# Patient Record
Sex: Female | Born: 1985
Health system: Southern US, Community
[De-identification: ages and names within clinical notes are randomized; demographics above are authoritative.]

## PROBLEM LIST (undated history)

## (undated) DIAGNOSIS — Q2112 Patent foramen ovale: Secondary | ICD-10-CM

## (undated) DIAGNOSIS — C801 Malignant (primary) neoplasm, unspecified: Secondary | ICD-10-CM

## (undated) DIAGNOSIS — C3490 Malignant neoplasm of unspecified part of unspecified bronchus or lung: Secondary | ICD-10-CM

## (undated) DIAGNOSIS — I739 Peripheral vascular disease, unspecified: Secondary | ICD-10-CM

## (undated) DIAGNOSIS — F419 Anxiety disorder, unspecified: Secondary | ICD-10-CM

## (undated) HISTORY — PX: WISDOM TOOTH EXTRACTION: SHX21

## (undated) HISTORY — DX: Anxiety disorder, unspecified: F41.9

## (undated) HISTORY — DX: Malignant (primary) neoplasm, unspecified: C80.1

---

## 2011-08-12 ENCOUNTER — Other Ambulatory Visit: Payer: Self-pay | Admitting: Emergency Medicine

## 2011-08-12 DIAGNOSIS — R109 Unspecified abdominal pain: Secondary | ICD-10-CM

## 2011-08-12 DIAGNOSIS — N390 Urinary tract infection, site not specified: Secondary | ICD-10-CM

## 2011-08-12 DIAGNOSIS — R102 Pelvic and perineal pain: Secondary | ICD-10-CM

## 2011-08-14 ENCOUNTER — Ambulatory Visit
Admission: RE | Admit: 2011-08-14 | Discharge: 2011-08-14 | Disposition: A | Discharge status: Home / Self Care | Payer: 59 | Source: Ambulatory Visit | Attending: Emergency Medicine | Admitting: Emergency Medicine

## 2011-08-14 DIAGNOSIS — R109 Unspecified abdominal pain: Secondary | ICD-10-CM

## 2011-08-14 DIAGNOSIS — N390 Urinary tract infection, site not specified: Secondary | ICD-10-CM

## 2011-08-14 DIAGNOSIS — R102 Pelvic and perineal pain: Secondary | ICD-10-CM

## 2011-08-22 ENCOUNTER — Other Ambulatory Visit: Payer: Self-pay | Admitting: Internal Medicine

## 2011-08-26 ENCOUNTER — Other Ambulatory Visit: Payer: Self-pay | Admitting: Internal Medicine

## 2011-08-26 DIAGNOSIS — N1330 Unspecified hydronephrosis: Secondary | ICD-10-CM

## 2011-09-11 ENCOUNTER — Other Ambulatory Visit: Payer: 59

## 2011-09-18 ENCOUNTER — Ambulatory Visit
Admission: RE | Admit: 2011-09-18 | Discharge: 2011-09-18 | Disposition: A | Discharge status: Home / Self Care | Payer: 59 | Source: Ambulatory Visit | Attending: Internal Medicine | Admitting: Internal Medicine

## 2011-09-18 ENCOUNTER — Other Ambulatory Visit: Payer: Self-pay | Admitting: Internal Medicine

## 2011-09-18 DIAGNOSIS — N1330 Unspecified hydronephrosis: Secondary | ICD-10-CM

## 2013-09-09 IMAGING — US US PELVIS COMPLETE
1 series · 14 of 25 positions shown · non-contrast
Comparison: None

CLINICAL DATA: Pelvic pain.

TRANSABDOMINAL AND TRANSVAGINAL ULTRASOUND OF PELVIS
TECHNIQUE: Both transabdominal and transvaginal ultrasound
examinations of the pelvis were performed. Transabdominal technique
was performed for global imaging of the pelvis including uterus,
ovaries, adnexal regions, and pelvic cul-de-sac.

[Series 1: us pelvis complete · 0.25mm/px · 14 of 68 slices shown]
[im 1/68]
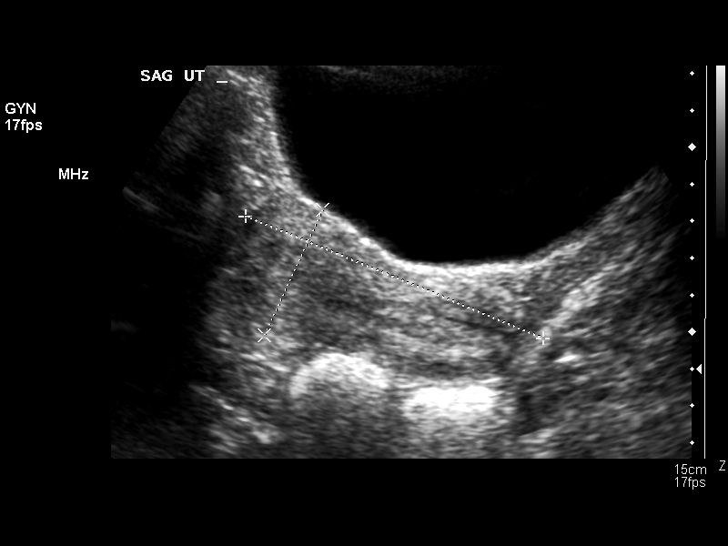
[im 6/68]
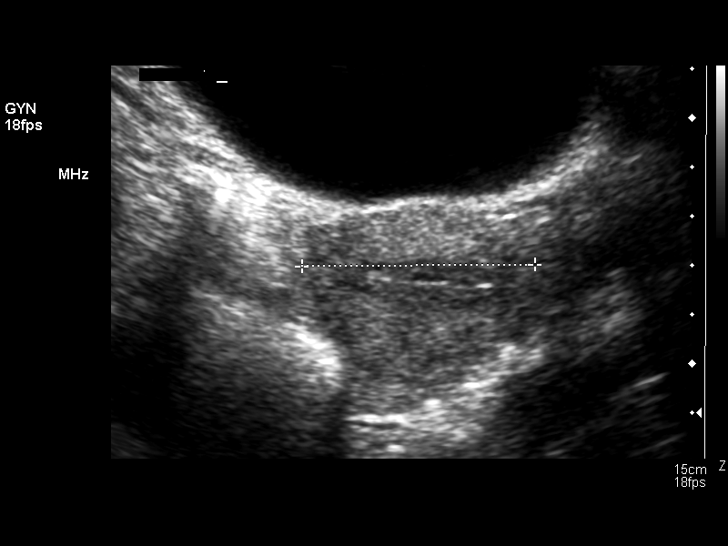
[im 12/68]
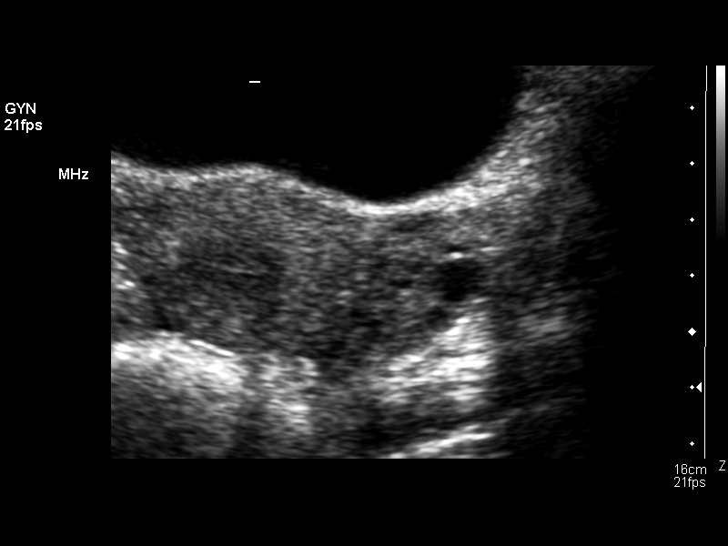
[im 17/68]
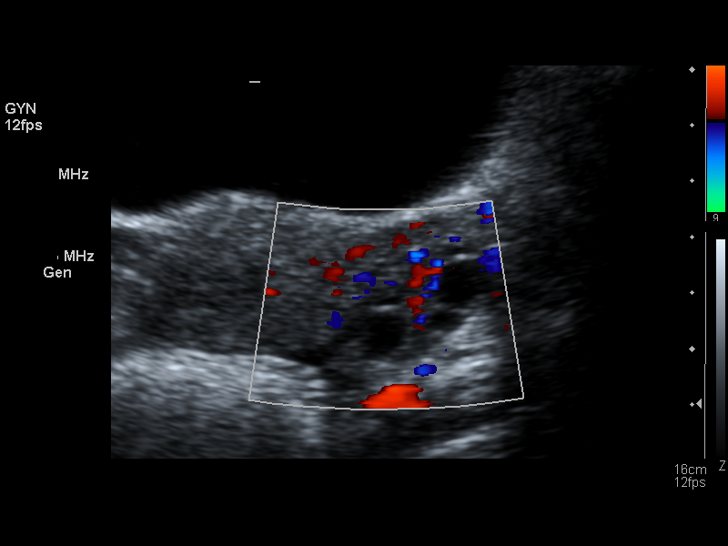
[im 23/68]
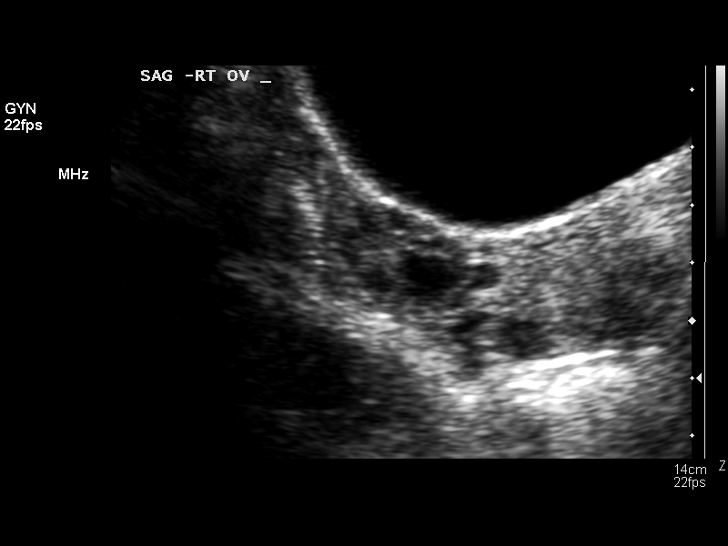
[im 26/68]
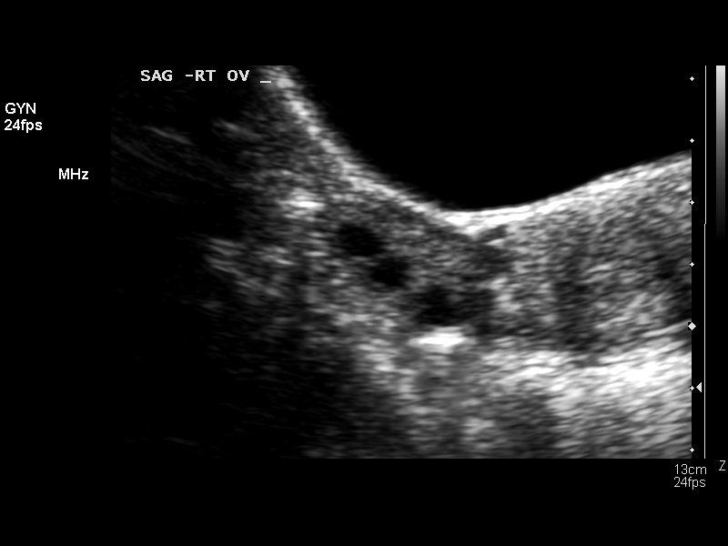
[im 31/68]
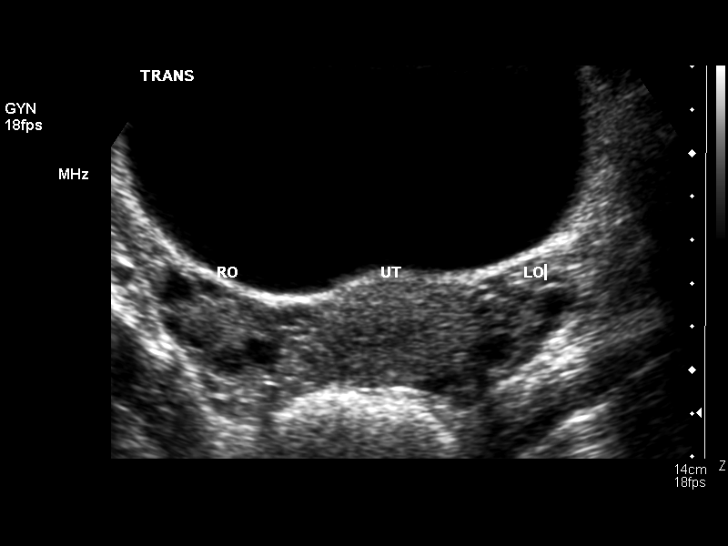
[im 37/68]
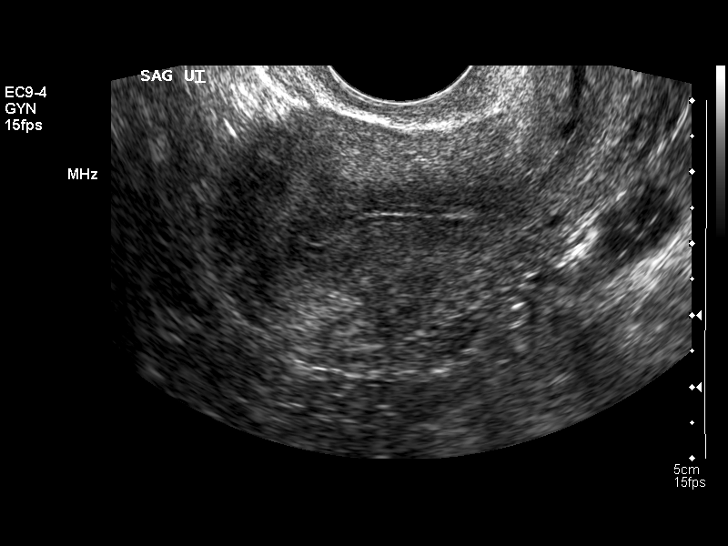
[im 42/68]
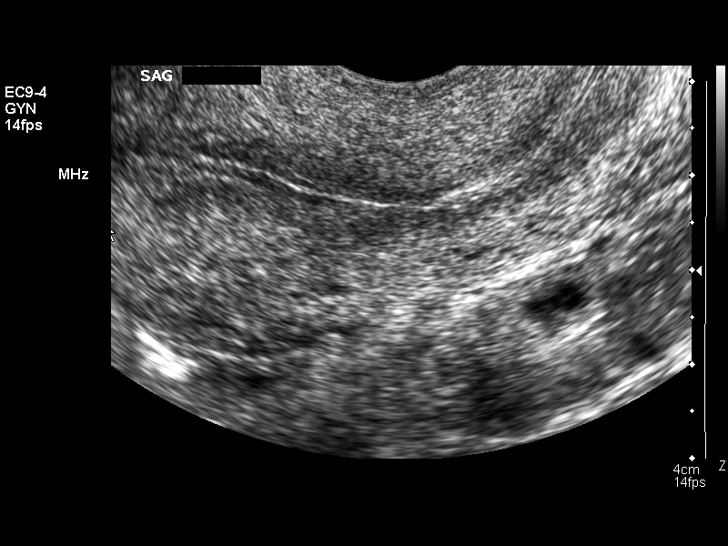
[im 45/68]
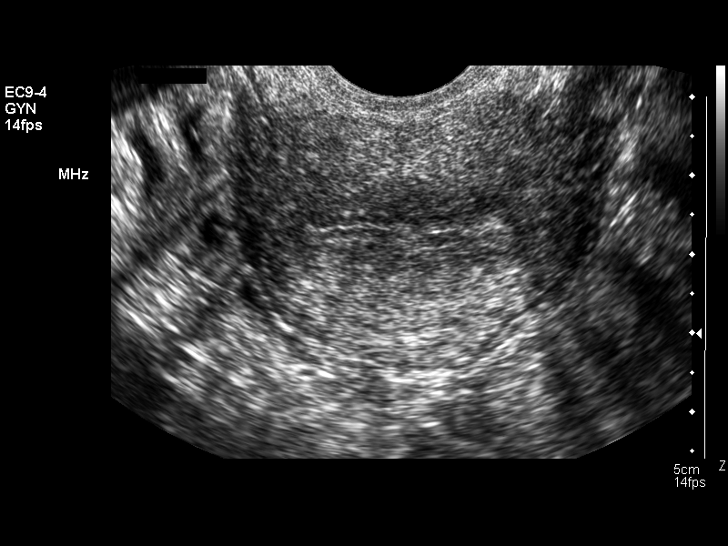
[im 51/68]
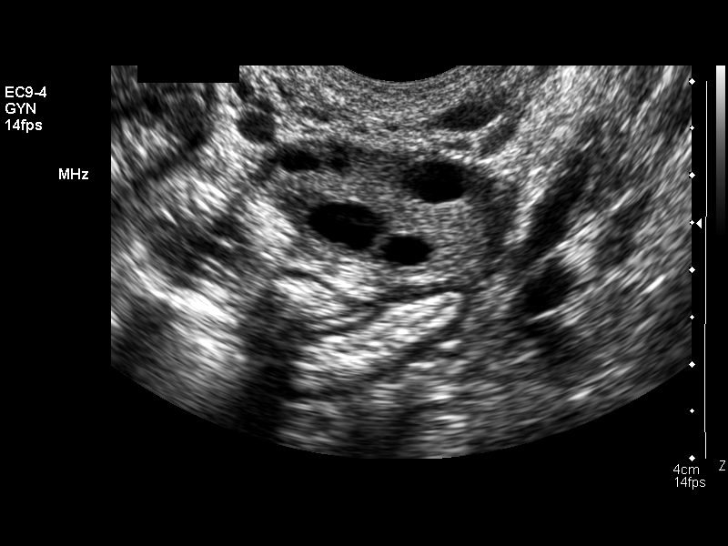
[im 56/68]
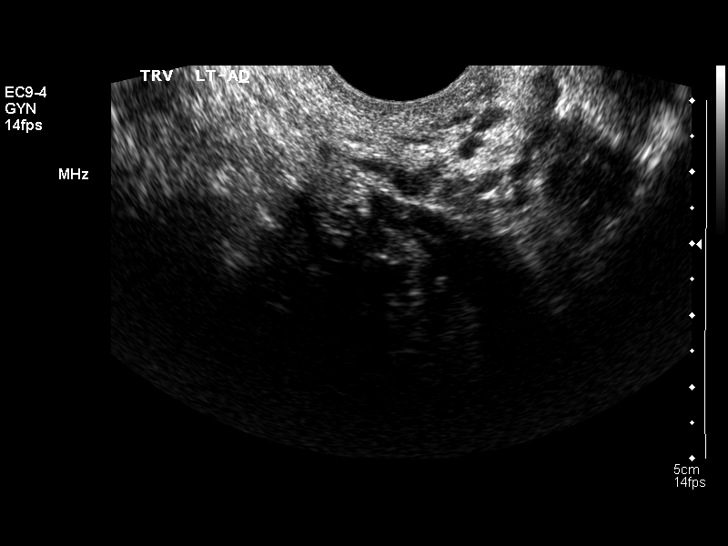
[im 62/68]
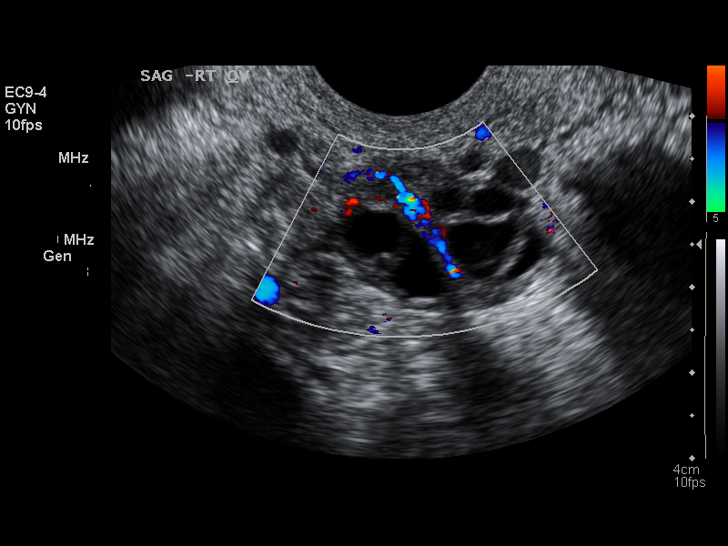
[im 68/68]
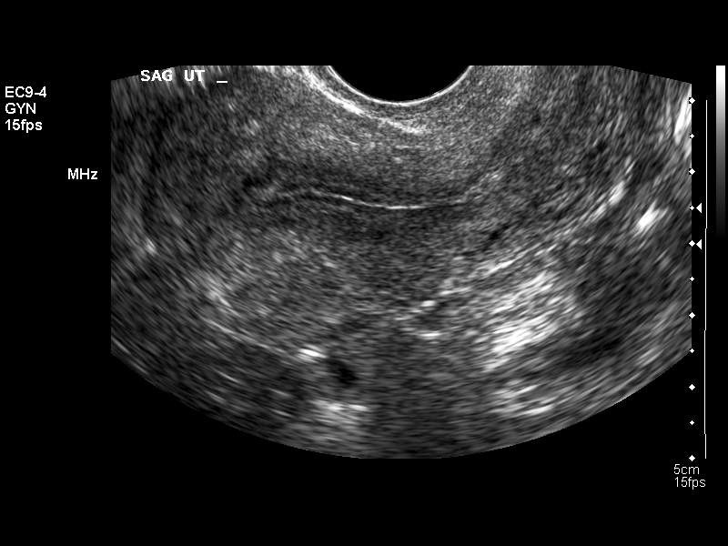

[14 of 25 positions shown; findings below may reference images not displayed]

It was necessary to proceed with endovaginal exam following the
transabdominal exam to visualize the endometrium and ovaries.
FINDINGS: Uterus: Measures 11.0 x 3.8 x 4.7 cm.  No myometrial abnormalities.

Endometrium: Normal in thickness measuring a maximum of 3.4 mm.

Right ovary:  Measures 3.5 x 1.7 x 2.8 cm.  No cysts or masses.
Small follicles are noted.

Left ovary: Measures 2.9 x 1.4 x 2.8 cm.  No cysts or masses.
Small follicles.

Other findings: None
IMPRESSION: Normal study. No evidence of pelvic mass or other significant
abnormality.

## 2014-02-14 ENCOUNTER — Ambulatory Visit (INDEPENDENT_AMBULATORY_CARE_PROVIDER_SITE_OTHER): Payer: 59 | Admitting: Internal Medicine

## 2014-02-14 VITALS — BP 122/76 | HR 88 | Temp 98.0°F | Resp 17 | Ht 66.0 in | Wt 129.0 lb

## 2014-02-14 DIAGNOSIS — I889 Nonspecific lymphadenitis, unspecified: Secondary | ICD-10-CM

## 2014-02-14 DIAGNOSIS — J301 Allergic rhinitis due to pollen: Secondary | ICD-10-CM

## 2014-02-14 NOTE — Patient Instructions (Signed)
The enlarged lump on your neck appears to be an enlarged lymph node due to seasonal allergies.  The best way for this to improve is by taking care of your allergy symptoms.  Please start taking allegra every day.  Also Nasacot OTC is a nasal spray that should help with nasal drainage.

## 2014-02-14 NOTE — Progress Notes (Signed)
Subjective:     Patient ID: Melissa Pineda, female   DOB: 1986-05-02, 28 y.o.   MRN: [id number]  HPI 28 yr female here for enlarged lymph node on the right neck region.  States the gland appeared yesterday and is very painful to the touch.  Last week the patient had a similar painful gland on the left side that resolved without tx after 2 days.   She has no associated symptoms of fevers, night sweats, cough.  Does admit sneezing, watery eyes.  Saw allergist last year who recommended daily allegra.  Has only been taking allergra every other day.    Review of Systems  Constitutional: Negative for fever, weight loss and malaise/fatigue.  HENT: Positive for congestion. Negative for ear pain and sore throat.   Eyes: Positive for discharge. Negative for pain.       Watery and itchy eyes  Respiratory: Negative for cough, shortness of breath and wheezing.   Cardiovascular: Negative for chest pain and palpitations.  Gastrointestinal: Negative for nausea, vomiting and abdominal pain.  Genitourinary: Negative for dysuria and urgency.  Musculoskeletal: Negative for myalgias and neck pain.  Skin: Negative for rash.  Neurological: Negative for dizziness, loss of consciousness and headaches.   Review of Systems  Constitutional: Negative for fever, weight loss and malaise/fatigue.  HENT: Positive for congestion. Negative for ear pain and sore throat.   Eyes: Positive for discharge. Negative for pain.       Watery and itchy eyes  Respiratory: Negative for cough, shortness of breath and wheezing.   Cardiovascular: Negative for chest pain and palpitations.  Gastrointestinal: Negative for nausea, vomiting and abdominal pain.  Genitourinary: Negative for dysuria and urgency.  Musculoskeletal: Negative for myalgias and neck pain.  Skin: Negative for rash.  Neurological: Negative for dizziness, loss of consciousness and headaches.       Objective:   Physical Exam  Constitutional: She is oriented to person,  place, and time. She appears well-developed and well-nourished.  HENT:  Head: Normocephalic and atraumatic.  Right Ear: Hearing, tympanic membrane and external ear normal.  Left Ear: Hearing, tympanic membrane and external ear normal.  Nose: Mucosal edema present. Right sinus exhibits maxillary sinus tenderness. Right sinus exhibits no frontal sinus tenderness. Left sinus exhibits maxillary sinus tenderness. Left sinus exhibits no frontal sinus tenderness.  Mouth/Throat: Oropharynx is clear and moist. No oral lesions. Normal dentition. No dental abscesses, uvula swelling or dental caries. No oropharyngeal exudate, posterior oropharyngeal edema, posterior oropharyngeal erythema or tonsillar abscesses.  Eyes: Pupils are equal, round, and reactive to light.  Neck: Normal range of motion. Neck supple.    1 cm enlarged and tender node on the right cervical chain anterior to the SCM, 1 cm below the angle of the mandible   Cardiovascular: Normal rate and regular rhythm.   Pulmonary/Chest: Effort normal and breath sounds normal. She has no wheezes. She has no rales.  Abdominal: Soft. Bowel sounds are normal. She exhibits no mass. There is no tenderness.  Lymphadenopathy:    She has cervical adenopathy.  Neurological: She is alert and oriented to person, place, and time.  Skin: Skin is warm and dry.  Patchy redness on chest area.   Pt states she gets when she touches her neck.   Psychiatric: She has a normal mood and affect. Thought content normal.       Filed Vitals:   02/14/14 1555  BP: 122/76  Pulse: 88  Temp: 98 F (36.7 C)  Resp: 17  Assessment:     1. Enlarged cervical lymph node - rt side     Plan:     1.treat allergies more aggressively 2. Reck 10 d if not resolved      I have completed the patient encounter in its entirety as documented by [facility], with editing by me where necessary. Shemiah Rosch P. Laney Pastor, M.D.

## 2015-06-28 ENCOUNTER — Ambulatory Visit (INDEPENDENT_AMBULATORY_CARE_PROVIDER_SITE_OTHER): Payer: 59 | Admitting: Physician Assistant

## 2015-06-28 ENCOUNTER — Encounter: Payer: Self-pay | Admitting: Physician Assistant

## 2015-06-28 VITALS — BP 132/77 | HR 86 | Temp 98.7°F | Resp 16 | Ht 66.0 in | Wt 120.2 lb

## 2015-06-28 DIAGNOSIS — H9203 Otalgia, bilateral: Secondary | ICD-10-CM

## 2015-06-28 MED ORDER — FLUTICASONE PROPIONATE 50 MCG/ACT NA SUSP: 2.0000 | Freq: Every day | NASAL | Status: DC

## 2015-06-28 MED ORDER — AZITHROMYCIN 250 MG PO TABS: ORAL_TABLET | ORAL | Status: DC

## 2015-06-28 NOTE — Progress Notes (Signed)
   06/28/2015 at 3:12 PM  Melissa Pineda / DOB: Feb 25, 1986 / MRN: [id number]  The patient  does not have a problem list on file.  SUBJECTIVE  Melissa Pineda is a 29 y.o. female complaining of nasal blockage, post nasal drip and sinus and nasal congestion that started 7 days ago.  Associated symptoms include headache today, and she denies fever, cough, sore throat, difficulty breathing and jaw pain.The patient symptoms show no change. Treatments tried thus far include antihistamine-decongestant of choice with poor relief. She denies sick contacts.    She  has no past medical history on file.    Medications reviewed and updated by myself where necessary, and exist elsewhere in the encounter.   Melissa Pineda has No Known Allergies. She  reports that she has never smoked. She does not have any smokeless tobacco history on file. She  has no sexual activity history on file. The patient  has no past surgical history on file.  Her family history is not on file.  Review of Systems  Constitutional: Negative for fever and chills.  HENT: Positive for congestion and ear pain. Negative for tinnitus.   Eyes: Negative.   Respiratory: Negative for shortness of breath.   Cardiovascular: Negative for chest pain.  Gastrointestinal: Negative for nausea and abdominal pain.  Genitourinary: Negative.   Skin: Negative for rash.  Neurological: Negative for dizziness and headaches.    OBJECTIVE  Her  height is '5\' 6"'$  (1.676 m) and weight is 120 lb 3.2 oz (54.522 kg). Her oral temperature is 98.7 F (37.1 C). Her blood pressure is 132/77 and her pulse is 86. Her respiration is 16.  The patient's body mass index is 19.41 kg/(m^2).  Physical Exam  Constitutional: She is oriented to person, place, and time. She appears well-developed and well-nourished. No distress.  Eyes: Pupils are equal, round, and reactive to light.  Cardiovascular: Normal rate.   Respiratory: Effort normal. No respiratory distress.  GI: She exhibits  no distension.  Neurological: She is alert and oriented to person, place, and time.  Skin: Skin is warm and dry. She is not diaphoretic.  Psychiatric: She has a normal mood and affect. Her behavior is normal. Judgment and thought content normal.    No results found for this or any previous visit (from the past 24 hour(s)).  ASSESSMENT & PLAN  Melissa Pineda was seen today for ear pain and nasal congestion.  Diagnoses and all orders for this visit:  Ear pain, bilateral: ETD vs. ABRS.  Advised she try the flonase along with her normal Allegra-D 24 hour formulation first for 3 days, and if no improvement with this regimen then fill Abx.   -     azithromycin (ZITHROMAX) 250 MG tablet; Take 2 tabs PO x 1 dose, then 1 tab PO QD x 4 days -     fluticasone (FLONASE) 50 MCG/ACT nasal spray; Place 2 sprays into both nostrils daily. Take two sprays in each nostril daily.   The patient was advised to call or come back to clinic if she does not see an improvement in symptoms, or worsens with the above plan.   Philis Fendt, MHS, PA-C [facility] 06/28/2015 3:12 PM

## 2015-10-21 ENCOUNTER — Ambulatory Visit (INDEPENDENT_AMBULATORY_CARE_PROVIDER_SITE_OTHER): Payer: 59 | Admitting: Family Medicine

## 2015-10-21 VITALS — BP 120/80 | HR 97 | Temp 98.1°F | Resp 20 | Ht 66.0 in | Wt 124.2 lb

## 2015-10-21 DIAGNOSIS — J040 Acute laryngitis: Secondary | ICD-10-CM | POA: Diagnosis not present

## 2015-10-21 DIAGNOSIS — R059 Cough, unspecified: Secondary | ICD-10-CM

## 2015-10-21 DIAGNOSIS — J3489 Other specified disorders of nose and nasal sinuses: Secondary | ICD-10-CM | POA: Diagnosis not present

## 2015-10-21 DIAGNOSIS — R05 Cough: Secondary | ICD-10-CM

## 2015-10-21 MED ORDER — AZITHROMYCIN 250 MG PO TABS: ORAL_TABLET | ORAL | Status: DC

## 2015-10-21 NOTE — Patient Instructions (Signed)
Saline nasal spray atleast 4 times per day if needed for congestion, over the counter mucinex or mucinex DM for cough, cepacol or other cough drop for sore throat. Drink plenty of fluids, voice rest as discussed. If discolored sinus discharge and pain not improving next week - can fill Zpak.  Return to the clinic or go to the nearest emergency room if any of your symptoms worsen or new symptoms occur.  Upper Respiratory Infection, Adult Most upper respiratory infections (URIs) are a viral infection of the air passages leading to the lungs. A URI affects the nose, throat, and upper air passages. The most common type of URI is nasopharyngitis and is typically referred to as "the common cold." URIs run their course and usually go away on their own. Most of the time, a URI does not require medical attention, but sometimes a bacterial infection in the upper airways can follow a viral infection. This is called a secondary infection. Sinus and middle ear infections are common types of secondary upper respiratory infections. Bacterial pneumonia can also complicate a URI. A URI can worsen asthma and chronic obstructive pulmonary disease (COPD). Sometimes, these complications can require emergency medical care and may be life threatening.  CAUSES Almost all URIs are caused by viruses. A virus is a type of germ and can spread from one person to another.  RISKS FACTORS You may be at risk for a URI if:   You smoke.   You have chronic heart or lung disease.  You have a weakened defense (immune) system.   You are very young or very old.   You have nasal allergies or asthma.  You work in crowded or poorly ventilated areas.  You work in health care facilities or schools. SIGNS AND SYMPTOMS  Symptoms typically develop 2-3 days after you come in contact with a cold virus. Most viral URIs last 7-10 days. However, viral URIs from the influenza virus (flu virus) can last 14-18 days and are typically more  severe. Symptoms may include:   Runny or stuffy (congested) nose.   Sneezing.   Cough.   Sore throat.   Headache.   Fatigue.   Fever.   Loss of appetite.   Pain in your forehead, behind your eyes, and over your cheekbones (sinus pain).  Muscle aches.  DIAGNOSIS  Your health care provider may diagnose a URI by:  Physical exam.  Tests to check that your symptoms are not due to another condition such as:  Strep throat.  Sinusitis.  Pneumonia.  Asthma. TREATMENT  A URI goes away on its own with time. It cannot be cured with medicines, but medicines may be prescribed or recommended to relieve symptoms. Medicines may help:  Reduce your fever.  Reduce your cough.  Relieve nasal congestion. HOME CARE INSTRUCTIONS   Take medicines only as directed by your health care provider.   Gargle warm saltwater or take cough drops to comfort your throat as directed by your health care provider.  Use a warm mist humidifier or inhale steam from a shower to increase air moisture. This may make it easier to breathe.  Drink enough fluid to keep your urine clear or pale yellow.   Eat soups and other clear broths and maintain good nutrition.   Rest as needed.   Return to work when your temperature has returned to normal or as your health care provider advises. You may need to stay home longer to avoid infecting others. You can also use a face mask  and careful hand washing to prevent spread of the virus.  Increase the usage of your inhaler if you have asthma.   Do not use any tobacco products, including cigarettes, chewing tobacco, or electronic cigarettes. If you need help quitting, ask your health care provider. PREVENTION  The best way to protect yourself from getting a cold is to practice good hygiene.   Avoid oral or hand contact with people with cold symptoms.   Wash your hands often if contact occurs.  There is no clear evidence that vitamin C, vitamin  E, echinacea, or exercise reduces the chance of developing a cold. However, it is always recommended to get plenty of rest, exercise, and practice good nutrition.  SEEK MEDICAL CARE IF:   You are getting worse rather than better.   Your symptoms are not controlled by medicine.   You have chills.  You have worsening shortness of breath.  You have brown or red mucus.  You have yellow or brown nasal discharge.  You have pain in your face, especially when you bend forward.  You have a fever.  You have swollen neck glands.  You have pain while swallowing.  You have white areas in the back of your throat. SEEK IMMEDIATE MEDICAL CARE IF:   You have severe or persistent:  Headache.  Ear pain.  Sinus pain.  Chest pain.  You have chronic lung disease and any of the following:  Wheezing.  Prolonged cough.  Coughing up blood.  A change in your usual mucus.  You have a stiff neck.  You have changes in your:  Vision.  Hearing.  Thinking.  Mood. MAKE SURE YOU:   Understand these instructions.  Will watch your condition.  Will get help right away if you are not doing well or get worse.   This information is not intended to replace advice given to you by your health care provider. Make sure you discuss any questions you have with your health care provider.   Document Released: 04/02/2001 Document Revised: 02/21/2015 Document Reviewed: 01/12/2014 Elsevier Interactive Patient Education 2016 [employer]  Laryngitis Laryngitis is inflammation of your vocal cords. This causes hoarseness, coughing, loss of voice, sore throat, or a dry throat. Your vocal cords are two bands of muscles that are found in your throat. When you speak, these cords come together and vibrate. These vibrations come out through your mouth as sound. When your vocal cords are inflamed, your voice sounds different. Laryngitis can be temporary (acute) or long-term (chronic). Most cases of  acute laryngitis improve with time. Chronic laryngitis is laryngitis that lasts for more than three weeks. CAUSES Acute laryngitis may be caused by:  A viral infection.  Lots of talking, yelling, or singing. This is also called vocal strain.  Bacterial infections. Chronic laryngitis may be caused by:  Vocal strain.  Injury to your vocal cords.  Acid reflux (gastroesophageal reflux disease or GERD).  Allergies.  Sinus infection.  Smoking.  Alcohol abuse.  Breathing in chemicals or dust.  Growths on the vocal cords. RISK FACTORS Risk factors for laryngitis include:  Smoking.  Alcohol abuse.  Having allergies. SIGNS AND SYMPTOMS Symptoms of laryngitis may include:  Low, hoarse voice.  Loss of voice.  Dry cough.  Sore throat.  Stuffy nose. DIAGNOSIS Laryngitis may be diagnosed by:  Physical exam.  Throat culture.  Blood test.  Laryngoscopy. This procedure allows your health care provider to look at your vocal cords with a mirror or viewing tube. TREATMENT Treatment  for laryngitis depends on what is causing it. Usually, treatment involves resting your voice and using medicines to soothe your throat. However, if your laryngitis is caused by a bacterial infection, you may need to take antibiotic medicine. If your laryngitis is caused by a growth, you may need to have a procedure to remove it. HOME CARE INSTRUCTIONS  Drink enough fluid to keep your urine clear or pale yellow.  Breathe in moist air. Use a humidifier if you live in a dry climate.  Take medicines only as directed by your health care provider.  If you were prescribed an antibiotic medicine, finish it all even if you start to feel better.  Do not smoke cigarettes or electronic cigarettes. If you need help quitting, ask your health care provider.  Talk as little as possible. Also avoid whispering, which can cause vocal strain.  Write instead of talking. Do this until your voice is back to  normal. SEEK MEDICAL CARE IF:  You have a fever.  You have increasing pain.  You have difficulty swallowing. SEEK IMMEDIATE MEDICAL CARE IF:  You cough up blood.  You have trouble breathing.   This information is not intended to replace advice given to you by your health care provider. Make sure you discuss any questions you have with your health care provider.   Document Released: 10/07/2005 Document Revised: 10/28/2014 Document Reviewed: 03/22/2014 Elsevier Interactive Patient Education [employer].

## 2015-10-21 NOTE — Progress Notes (Addendum)
Subjective:  This chart was scribed for Merri Ray, MD by [facility], medical scribe at [facility].The patient was seen in exam room 14 and the patient's care was started at 1:17 PM.   Patient ID: Melissa Pineda, female    DOB: 12-17-1985, 29 y.o.   MRN: [id number] Chief Complaint  Patient presents with  . Sore Throat    chest congestion, lost voice, x 4 days  . Chills   HPI HPI Comments: Melissa Pineda is a 28 y.o. female who presents to [facility] due to a sore throat, cough and chest congestion. About four days ago she woke with a fever and sore throat. The fever improved, but her throat has gotten worse. She has since developed a productive cough (green, yellow sputum), post nasal drip, chest congestion and hoarseness. Tried mucinex for relief, and tea with honey. Sick contacts, mother in law has similar symptoms. 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   There are no active problems to display for this patient.  History reviewed. No pertinent past medical history. History reviewed. No pertinent past surgical history. No Known Allergies Prior to Admission medications   Medication Sig Start Date End Date Taking? Authorizing Provider  norethindrone-ethinyl estradiol-iron (ESTROSTEP FE,TILIA FE,TRI-LEGEST FE) 1-20/1-30/1-35 MG-MCG tablet Take 1 tablet by mouth daily.   Yes Historical Provider, MD  azithromycin (ZITHROMAX) 250 MG tablet Take 2 tabs PO x 1 dose, then 1 tab PO QD x 4 days Patient not taking: Reported on 10/21/2015 06/28/15   Tereasa Coop, PA-C  fluticasone [facility]) 50 MCG/ACT nasal spray Place 2 sprays into both nostrils daily. Take two sprays in each nostril daily. Patient not taking: Reported on 10/21/2015 06/28/15   Tereasa Coop, PA-C   Social History   Social History  . Marital Status: Single    Spouse Name: N/A  . Number of Children: N/A  . Years of Education: N/A   Occupational History  . Not on file.   Social History  Main Topics  . Smoking status: Never Smoker   . Smokeless tobacco: Not on file  . Alcohol Use: Not on file  . Drug Use: Not on file  . Sexual Activity: Not on file   Other Topics Concern  . Not on file   Social History Narrative   Review of Systems  Constitutional: Negative for fever.  HENT: Positive for congestion, postnasal drip, sore throat and voice change.   Respiratory: Positive for cough.       Objective:  BP 120/80 mmHg  Pulse 97  Temp(Src) 98.1 F (36.7 C) (Oral)  Resp 20  Ht '5\' 6"'$  (1.676 m)  Wt 124 lb 3.2 oz (56.337 kg)  BMI 20.06 kg/m2  SpO2 98%  LMP 10/09/2015 Physical Exam  Constitutional: She is oriented to person, place, and time. She appears well-developed and well-nourished. No distress.  HENT:  Head: Normocephalic and atraumatic.  Moist oral mucosa, no exudate.  Eyes: Pupils are equal, round, and reactive to light.  Neck: Normal range of motion.  Cardiovascular: Normal rate and regular rhythm.   Pulmonary/Chest: Effort normal and breath sounds normal. No respiratory distress. She has no wheezes.  Musculoskeletal: Normal range of motion.  Neurological: She is alert and oriented to person, place, and time.  Skin: Skin is warm and dry.  Psychiatric: She has a normal mood and affect. Her behavior is normal.  Nursing note and vitals reviewed.     Assessment & Plan:  Melissa Pineda is a 29 y.o. female Cough  Sinus pressure - Plan: azithromycin (ZITHROMAX) 250 MG tablet  Laryngitis  Viral URI/laryngitis, vs. early sinusitis. Suspected viral cause at this point. Sx care discussed as in AVS, but if not improving into next week - can fill Zpak. rtc precautions if worsening.   Meds ordered this encounter  Medications  . azithromycin (ZITHROMAX) 250 MG tablet    Sig: Take 2 pills by mouth on day 1, then 1 pill by mouth per day on days 2 through 5.    Dispense:  6 tablet    Refill:  0   Patient Instructions  Saline nasal spray atleast 4 times per  day if needed for congestion, over the counter mucinex or mucinex DM for cough, cepacol or other cough drop for sore throat. Drink plenty of fluids, voice rest as discussed. If discolored sinus discharge and pain not improving next week - can fill Zpak.  Return to the clinic or go to the nearest emergency room if any of your symptoms worsen or new symptoms occur.  Upper Respiratory Infection, Adult Most upper respiratory infections (URIs) are a viral infection of the air passages leading to the lungs. A URI affects the nose, throat, and upper air passages. The most common type of URI is nasopharyngitis and is typically referred to as "the common cold." URIs run their course and usually go away on their own. Most of the time, a URI does not require medical attention, but sometimes a bacterial infection in the upper airways can follow a viral infection. This is called a secondary infection. Sinus and middle ear infections are common types of secondary upper respiratory infections. Bacterial pneumonia can also complicate a URI. A URI can worsen asthma and chronic obstructive pulmonary disease (COPD). Sometimes, these complications can require emergency medical care and may be life threatening.  CAUSES Almost all URIs are caused by viruses. A virus is a type of germ and can spread from one person to another.  RISKS FACTORS You may be at risk for a URI if:   You smoke.   You have chronic heart or lung disease.  You have a weakened defense (immune) system.   You are very young or very old.   You have nasal allergies or asthma.  You work in crowded or poorly ventilated areas.  You work in health care facilities or schools. SIGNS AND SYMPTOMS  Symptoms typically develop 2-3 days after you come in contact with a cold virus. Most viral URIs last 7-10 days. However, viral URIs from the influenza virus (flu virus) can last 14-18 days and are typically more severe. Symptoms may include:   Runny or  stuffy (congested) nose.   Sneezing.   Cough.   Sore throat.   Headache.   Fatigue.   Fever.   Loss of appetite.   Pain in your forehead, behind your eyes, and over your cheekbones (sinus pain).  Muscle aches.  DIAGNOSIS  Your health care provider may diagnose a URI by:  Physical exam.  Tests to check that your symptoms are not due to another condition such as:  Strep throat.  Sinusitis.  Pneumonia.  Asthma. TREATMENT  A URI goes away on its own with time. It cannot be cured with medicines, but medicines may be prescribed or recommended to relieve symptoms. Medicines may help:  Reduce your fever.  Reduce your cough.  Relieve nasal congestion. HOME CARE INSTRUCTIONS   Take medicines only as directed by your health  care provider.   Gargle warm saltwater or take cough drops to comfort your throat as directed by your health care provider.  Use a warm mist humidifier or inhale steam from a shower to increase air moisture. This may make it easier to breathe.  Drink enough fluid to keep your urine clear or pale yellow.   Eat soups and other clear broths and maintain good nutrition.   Rest as needed.   Return to work when your temperature has returned to normal or as your health care provider advises. You may need to stay home longer to avoid infecting others. You can also use a face mask and careful hand washing to prevent spread of the virus.  Increase the usage of your inhaler if you have asthma.   Do not use any tobacco products, including cigarettes, chewing tobacco, or electronic cigarettes. If you need help quitting, ask your health care provider. PREVENTION  The best way to protect yourself from getting a cold is to practice good hygiene.   Avoid oral or hand contact with people with cold symptoms.   Wash your hands often if contact occurs.  There is no clear evidence that vitamin C, vitamin E, echinacea, or exercise reduces the chance  of developing a cold. However, it is always recommended to get plenty of rest, exercise, and practice good nutrition.  SEEK MEDICAL CARE IF:   You are getting worse rather than better.   Your symptoms are not controlled by medicine.   You have chills.  You have worsening shortness of breath.  You have brown or red mucus.  You have yellow or brown nasal discharge.  You have pain in your face, especially when you bend forward.  You have a fever.  You have swollen neck glands.  You have pain while swallowing.  You have white areas in the back of your throat. SEEK IMMEDIATE MEDICAL CARE IF:   You have severe or persistent:  Headache.  Ear pain.  Sinus pain.  Chest pain.  You have chronic lung disease and any of the following:  Wheezing.  Prolonged cough.  Coughing up blood.  A change in your usual mucus.  You have a stiff neck.  You have changes in your:  Vision.  Hearing.  Thinking.  Mood. MAKE SURE YOU:   Understand these instructions.  Will watch your condition.  Will get help right away if you are not doing well or get worse.   This information is not intended to replace advice given to you by your health care provider. Make sure you discuss any questions you have with your health care provider.   Document Released: 04/02/2001 Document Revised: 02/21/2015 Document Reviewed: 01/12/2014 Elsevier Interactive Patient Education 2016 Elsevier Inc.  Laryngitis Laryngitis is inflammation of your vocal cords. This causes hoarseness, coughing, loss of voice, sore throat, or a dry throat. Your vocal cords are two bands of muscles that are found in your throat. When you speak, these cords come together and vibrate. These vibrations come out through your mouth as sound. When your vocal cords are inflamed, your voice sounds different. Laryngitis can be temporary (acute) or long-term (chronic). Most cases of acute laryngitis improve with time. Chronic  laryngitis is laryngitis that lasts for more than three weeks. CAUSES Acute laryngitis may be caused by:  A viral infection.  Lots of talking, yelling, or singing. This is also called vocal strain.  Bacterial infections. Chronic laryngitis may be caused by:  Vocal strain.  Injury to  your vocal cords.  Acid reflux (gastroesophageal reflux disease or GERD).  Allergies.  Sinus infection.  Smoking.  Alcohol abuse.  Breathing in chemicals or dust.  Growths on the vocal cords. RISK FACTORS Risk factors for laryngitis include:  Smoking.  Alcohol abuse.  Having allergies. SIGNS AND SYMPTOMS Symptoms of laryngitis may include:  Low, hoarse voice.  Loss of voice.  Dry cough.  Sore throat.  Stuffy nose. DIAGNOSIS Laryngitis may be diagnosed by:  Physical exam.  Throat culture.  Blood test.  Laryngoscopy. This procedure allows your health care provider to look at your vocal cords with a mirror or viewing tube. TREATMENT Treatment for laryngitis depends on what is causing it. Usually, treatment involves resting your voice and using medicines to soothe your throat. However, if your laryngitis is caused by a bacterial infection, you may need to take antibiotic medicine. If your laryngitis is caused by a growth, you may need to have a procedure to remove it. HOME CARE INSTRUCTIONS  Drink enough fluid to keep your urine clear or pale yellow.  Breathe in moist air. Use a humidifier if you live in a dry climate.  Take medicines only as directed by your health care provider.  If you were prescribed an antibiotic medicine, finish it all even if you start to feel better.  Do not smoke cigarettes or electronic cigarettes. If you need help quitting, ask your health care provider.  Talk as little as possible. Also avoid whispering, which can cause vocal strain.  Write instead of talking. Do this until your voice is back to normal. SEEK MEDICAL CARE IF:  You have a  fever.  You have increasing pain.  You have difficulty swallowing. SEEK IMMEDIATE MEDICAL CARE IF:  You cough up blood.  You have trouble breathing.   This information is not intended to replace advice given to you by your health care provider. Make sure you discuss any questions you have with your health care provider.   Document Released: 10/07/2005 Document Revised: 10/28/2014 Document Reviewed: 03/22/2014 Elsevier Interactive Patient Education [employer].    I personally performed the services described in this documentation, which was scribed in my presence. The recorded information has been reviewed and considered, and addended by me as needed.   By signing my name below, I, Nadim Abuhashem, attest that this documentation has been prepared under the direction and in the presence of Merri Ray, MD.  Electronically Signed: Lora Havens, medical scribe. 10/21/2015, 1:24 PM.

## 2017-02-11 ENCOUNTER — Ambulatory Visit (INDEPENDENT_AMBULATORY_CARE_PROVIDER_SITE_OTHER): Payer: 59 | Admitting: Family Medicine

## 2017-02-11 VITALS — BP 124/72 | HR 75 | Temp 98.8°F | Resp 18 | Ht 66.0 in | Wt 134.2 lb

## 2017-02-11 DIAGNOSIS — J069 Acute upper respiratory infection, unspecified: Secondary | ICD-10-CM

## 2017-02-11 DIAGNOSIS — J301 Allergic rhinitis due to pollen: Secondary | ICD-10-CM | POA: Diagnosis not present

## 2017-02-11 MED ORDER — FLUTICASONE PROPIONATE 50 MCG/ACT NA SUSP: 2.0000 | Freq: Every day | NASAL | 6 refills | Status: DC

## 2017-02-11 NOTE — Patient Instructions (Addendum)
IF you received an x-ray today, you will receive an invoice from [facility]. Please contact [facility] at [phone] with questions or concerns regarding your invoice.   IF you received labwork today, you will receive an invoice from [place]. Please contact [facility] at [phone] with questions or concerns regarding your invoice.   Our billing staff will not be able to assist you with questions regarding bills from these companies.  You will be contacted with the lab results as soon as they are available. The fastest way to get your results is to activate your My Chart account. Instructions are located on the last page of this paperwork. If you have not heard from [place] regarding the results in 2 weeks, please contact this office.     Allergic Rhinitis Allergic rhinitis is when the mucous membranes in the nose respond to allergens. Allergens are particles in the air that cause your body to have an allergic reaction. This causes you to release allergic antibodies. Through a chain of events, these eventually cause you to release histamine into the blood stream. Although meant to protect the body, it is this release of histamine that causes your discomfort, such as frequent sneezing, congestion, and an itchy, runny nose. What are the causes? Seasonal allergic rhinitis (hay fever) is caused by pollen allergens that may come from grasses, trees, and weeds. Year-round allergic rhinitis (perennial allergic rhinitis) is caused by allergens such as house dust mites, pet dander, and mold spores. What are the signs or symptoms?  Nasal stuffiness (congestion).  Itchy, runny nose with sneezing and tearing of the eyes. How is this diagnosed? Your health care provider can help you determine the allergen or allergens that trigger your symptoms. If you and your health care provider are unable to determine the allergen, skin or blood testing may be used. Your health care provider will  diagnose your condition after taking your health history and performing a physical exam. Your health care provider may assess you for other related conditions, such as asthma, pink eye, or an ear infection. How is this treated? Allergic rhinitis does not have a cure, but it can be controlled by:  Medicines that block allergy symptoms. These may include allergy shots, nasal sprays, and oral antihistamines.  Avoiding the allergen. Hay fever may often be treated with antihistamines in pill or nasal spray forms. Antihistamines block the effects of histamine. There are over-the-counter medicines that may help with nasal congestion and swelling around the eyes. Check with your health care provider before taking or giving this medicine. If avoiding the allergen or the medicine prescribed do not work, there are many new medicines your health care provider can prescribe. Stronger medicine may be used if initial measures are ineffective. Desensitizing injections can be used if medicine and avoidance does not work. Desensitization is when a patient is given ongoing shots until the body becomes less sensitive to the allergen. Make sure you follow up with your health care provider if problems continue. Follow these instructions at home: It is not possible to completely avoid allergens, but you can reduce your symptoms by taking steps to limit your exposure to them. It helps to know exactly what you are allergic to so that you can avoid your specific triggers. Contact a health care provider if:  You have a fever.  You develop a cough that does not stop easily (persistent).  You have shortness of breath.  You start wheezing.  Symptoms interfere with normal daily activities.  This information is not intended to replace advice given to you by your health care provider. Make sure you discuss any questions you have with your health care provider. Document Released: 07/02/2001 Document Revised: 06/07/2016 Document  Reviewed: 06/14/2013 Elsevier Interactive Patient Education  2017 [employer].

## 2017-02-11 NOTE — Progress Notes (Signed)
  Chief Complaint  Patient presents with  . URI  . Cough    brownish green mucus   . Nasal Congestion    x5days    HPI   Upper Respiratory Infection: Patient complains of symptoms of a URI. Symptoms include congestion. Onset of symptoms was 5 days ago, unchanged since that time. She also c/o congestion, headache described as frontal location and is worse with coughing, lightheadedness, no  fever, post nasal drip, productive cough with  yellow-green sputum colored sputum, sneezing and sore throat for the past 5 days .  She is drinking plenty of fluids. Evaluation to date: none. Treatment to date: antihistamines, cough suppressants and decongestants.  She took allegra-d and mucinex sinus/cold     No past medical history on file.  Current Outpatient Prescriptions  Medication Sig Dispense Refill  . norethindrone-ethinyl estradiol-iron (ESTROSTEP FE,TILIA FE,TRI-LEGEST FE) 1-20/1-30/1-35 MG-MCG tablet Take 1 tablet by mouth daily.    . fluticasone (FLONASE) 50 MCG/ACT nasal spray Place 2 sprays into both nostrils daily. 16 g 6   No current facility-administered medications for this visit.     Allergies: No Known Allergies  No past surgical history on file.  Social History   Social History  . Marital status: Single    Spouse name: N/A  . Number of children: N/A  . Years of education: N/A   Social History Main Topics  . Smoking status: Never Smoker  . Smokeless tobacco: Never Used  . Alcohol use None  . Drug use: Unknown  . Sexual activity: Not Asked   Other Topics Concern  . None   Social History Narrative  . None    ROS  Objective: Vitals:   02/11/17 1032 02/11/17 1112  BP: (!) 144/78 124/72  Pulse: 75   Resp: 18   Temp: 98.8 F (37.1 C)   TempSrc: Oral   SpO2: 99%   Weight: 134 lb 3.2 oz (60.9 kg)   Height: '5\' 6"'$  (1.676 m)     Physical Exam General: alert, oriented, in NAD Head: normocephalic, atraumatic, bilaterally maxillary sinus tenderness Eyes:  EOM intact, no scleral icterus or conjunctival injection Ears: TM clear on right, left TM with excoriation and trace blood, no blood or purulent drainage behind the TM Nose: mucosa nonerythematous, nonedematous Throat: no pharyngeal exudate or erythema Lymph: no posterior auricular, submental or cervical lymph adenopathy Heart: normal rate, normal sinus rhythm, no murmurs Lungs: clear to auscultation bilaterally, no wheezing   Assessment and Plan Brindley was seen today for uri, cough and nasal congestion.  Diagnoses and all orders for this visit:  Seasonal allergic rhinitis due to pollen- continue antihistamine  Advised pt to add flonase  Acute upper respiratory infection- Discussed viral etiology Discussed otc decongestant Advised flonase for congestion  Increase hydration Vitamin C and zinc lozenges suggested Return to clinic if symptoms worse   Other orders -     fluticasone (FLONASE) 50 MCG/ACT nasal spray; Place 2 sprays into both nostrils daily.     [place]

## 2017-03-04 ENCOUNTER — Ambulatory Visit (INDEPENDENT_AMBULATORY_CARE_PROVIDER_SITE_OTHER): Payer: 59 | Admitting: Family Medicine

## 2017-03-04 VITALS — BP 126/80 | HR 95 | Temp 98.2°F | Resp 16 | Ht 66.0 in | Wt 132.0 lb

## 2017-03-04 DIAGNOSIS — J301 Allergic rhinitis due to pollen: Secondary | ICD-10-CM | POA: Diagnosis not present

## 2017-03-04 MED ORDER — MONTELUKAST SODIUM 10 MG PO TABS: 10.0000 mg | ORAL_TABLET | Freq: Every day | ORAL | 6 refills | Status: DC

## 2017-03-04 NOTE — Patient Instructions (Addendum)
IF you received an x-ray today, you will receive an invoice from [facility]. Please contact [facility] at [phone] with questions or concerns regarding your invoice.   IF you received labwork today, you will receive an invoice from [place]. Please contact [facility] at [phone] with questions or concerns regarding your invoice.   Our billing staff will not be able to assist you with questions regarding bills from these companies.  You will be contacted with the lab results as soon as they are available. The fastest way to get your results is to activate your My Chart account. Instructions are located on the last page of this paperwork. If you have not heard from [place] regarding the results in 2 weeks, please contact this office.     Allergic Rhinitis Allergic rhinitis is when the mucous membranes in the nose respond to allergens. Allergens are particles in the air that cause your body to have an allergic reaction. This causes you to release allergic antibodies. Through a chain of events, these eventually cause you to release histamine into the blood stream. Although meant to protect the body, it is this release of histamine that causes your discomfort, such as frequent sneezing, congestion, and an itchy, runny nose. What are the causes? Seasonal allergic rhinitis (hay fever) is caused by pollen allergens that may come from grasses, trees, and weeds. Year-round allergic rhinitis (perennial allergic rhinitis) is caused by allergens such as house dust mites, pet dander, and mold spores. What are the signs or symptoms?  Nasal stuffiness (congestion).  Itchy, runny nose with sneezing and tearing of the eyes. How is this diagnosed? Your health care provider can help you determine the allergen or allergens that trigger your symptoms. If you and your health care provider are unable to determine the allergen, skin or blood testing may be used. Your health care provider will  diagnose your condition after taking your health history and performing a physical exam. Your health care provider may assess you for other related conditions, such as asthma, pink eye, or an ear infection. How is this treated? Allergic rhinitis does not have a cure, but it can be controlled by:  Medicines that block allergy symptoms. These may include allergy shots, nasal sprays, and oral antihistamines.  Avoiding the allergen. Hay fever may often be treated with antihistamines in pill or nasal spray forms. Antihistamines block the effects of histamine. There are over-the-counter medicines that may help with nasal congestion and swelling around the eyes. Check with your health care provider before taking or giving this medicine. If avoiding the allergen or the medicine prescribed do not work, there are many new medicines your health care provider can prescribe. Stronger medicine may be used if initial measures are ineffective. Desensitizing injections can be used if medicine and avoidance does not work. Desensitization is when a patient is given ongoing shots until the body becomes less sensitive to the allergen. Make sure you follow up with your health care provider if problems continue. Follow these instructions at home: It is not possible to completely avoid allergens, but you can reduce your symptoms by taking steps to limit your exposure to them. It helps to know exactly what you are allergic to so that you can avoid your specific triggers. Contact a health care provider if:  You have a fever.  You develop a cough that does not stop easily (persistent).  You have shortness of breath.  You start wheezing.  Symptoms interfere with normal daily activities.  This information is not intended to replace advice given to you by your health care provider. Make sure you discuss any questions you have with your health care provider. Document Released: 07/02/2001 Document Revised: 06/07/2016 Document  Reviewed: 06/14/2013 Elsevier Interactive Patient Education  2017 [employer].

## 2017-03-04 NOTE — Progress Notes (Signed)
  Chief Complaint  Patient presents with  . Cough    Chest congestion, productive cough   . Fever    Pt had fever on Saturday   . Shortness of Breath    With activity     HPI   Pt reports that she has been having waxing and waning symptoms She states that since her 02/11/17 visit she was better until 5 days ago She started coughing with congestion Saturday she had a 101 fever that resolved She states that she feels short of breath with activities She states that she had to resume mucinex Her cough is productive of phlegm She reports that she feels winded and exhausted but not wheezing   No past medical history on file.  Current Outpatient Prescriptions  Medication Sig Dispense Refill  . norethindrone-ethinyl estradiol-iron (ESTROSTEP FE,TILIA FE,TRI-LEGEST FE) 1-20/1-30/1-35 MG-MCG tablet Take 1 tablet by mouth daily.    . fluticasone (FLONASE) 50 MCG/ACT nasal spray Place 2 sprays into both nostrils daily. (Patient not taking: Reported on 03/04/2017) 16 g 6  . montelukast (SINGULAIR) 10 MG tablet Take 1 tablet (10 mg total) by mouth at bedtime. 30 tablet 6   No current facility-administered medications for this visit.     Allergies: No Known Allergies  No past surgical history on file.  Social History   Social History  . Marital status: Single    Spouse name: N/A  . Number of children: N/A  . Years of education: N/A   Social History Main Topics  . Smoking status: Never Smoker  . Smokeless tobacco: Never Used  . Alcohol use Not on file  . Drug use: Unknown  . Sexual activity: Not on file   Other Topics Concern  . Not on file   Social History Narrative  . No narrative on file    ROS  Objective: Vitals:   03/04/17 0921  BP: 126/80  Pulse: 95  Resp: 16  Temp: 98.2 F (36.8 C)  TempSrc: Oral  SpO2: 100%  Weight: 132 lb (59.9 kg)  Height: '5\' 6"'$  (1.676 m)    Physical Exam General: alert, oriented, in NAD Head: normocephalic, atraumatic, no sinus  tenderness Eyes: EOM intact, no scleral icterus or conjunctival injection Ears: TM clear bilaterally Nose: mucosa nonerythematous, nonedematous Throat: no pharyngeal exudate or erythema Lymph: no posterior auricular, submental or cervical lymph adenopathy Heart: normal rate, normal sinus rhythm, no murmurs Lungs: clear to auscultation bilaterally, no wheezing   Assessment and Plan Nayab was seen today for cough, fever and shortness of breath.  Diagnoses and all orders for this visit:  Seasonal allergic rhinitis due to pollen  Normal lung exam Advised continued antihistamine as well as continued flonase Added singulair (discussed adverse reaction of mood changes and psychosis) -     montelukast (SINGULAIR) 10 MG tablet; Take 1 tablet (10 mg total) by mouth at bedtime.     [place]

## 2017-05-13 DIAGNOSIS — Z01419 Encounter for gynecological examination (general) (routine) without abnormal findings: Secondary | ICD-10-CM | POA: Diagnosis not present

## 2017-05-13 DIAGNOSIS — F419 Anxiety disorder, unspecified: Secondary | ICD-10-CM | POA: Diagnosis present

## 2017-05-13 DIAGNOSIS — Z124 Encounter for screening for malignant neoplasm of cervix: Secondary | ICD-10-CM | POA: Diagnosis not present

## 2017-12-05 DIAGNOSIS — J101 Influenza due to other identified influenza virus with other respiratory manifestations: Secondary | ICD-10-CM | POA: Diagnosis not present

## 2017-12-05 DIAGNOSIS — J019 Acute sinusitis, unspecified: Secondary | ICD-10-CM | POA: Diagnosis not present

## 2018-05-19 DIAGNOSIS — Z01419 Encounter for gynecological examination (general) (routine) without abnormal findings: Secondary | ICD-10-CM | POA: Diagnosis not present

## 2018-12-11 ENCOUNTER — Other Ambulatory Visit: Payer: Self-pay

## 2018-12-11 ENCOUNTER — Encounter: Payer: Self-pay | Admitting: Emergency Medicine

## 2018-12-11 ENCOUNTER — Ambulatory Visit: Payer: 59 | Admitting: Emergency Medicine

## 2018-12-11 VITALS — BP 128/84 | HR 110 | Temp 99.5°F | Resp 16 | Ht 66.0 in | Wt 128.8 lb

## 2018-12-11 DIAGNOSIS — J111 Influenza due to unidentified influenza virus with other respiratory manifestations: Secondary | ICD-10-CM | POA: Diagnosis not present

## 2018-12-11 DIAGNOSIS — R52 Pain, unspecified: Secondary | ICD-10-CM

## 2018-12-11 LAB — POCT INFLUENZA A/B: Influenza B, POC: POSITIVE — AB

## 2018-12-11 MED ORDER — PSEUDOEPHEDRINE-GUAIFENESIN ER 60-600 MG PO TB12: 1.0000 | ORAL_TABLET | Freq: Two times a day (BID) | ORAL | 1 refills | Status: AC

## 2018-12-11 MED ORDER — BALOXAVIR MARBOXIL(80 MG DOSE) 2 X 40 MG PO TBPK: 80.0000 mg | ORAL_TABLET | Freq: Once | ORAL | 1 refills | Status: AC

## 2018-12-11 NOTE — Patient Instructions (Addendum)
If you have lab work done today you will be contacted with your lab results within the next 2 weeks.  If you have not heard from [place] then please contact us. The fastest way to get your results is to register for My Chart.   IF you received an x-ray today, you will receive an invoice from [facility]. Please contact [facility] at [phone] with questions or concerns regarding your invoice.   IF you received labwork today, you will receive an invoice from [place]. Please contact [facility] at [phone] with questions or concerns regarding your invoice.   Our billing staff will not be able to assist you with questions regarding bills from these companies.  You will be contacted with the lab results as soon as they are available. The fastest way to get your results is to activate your My Chart account. Instructions are located on the last page of this paperwork. If you have not heard from [place] regarding the results in 2 weeks, please contact this office.     Influenza, Adult Influenza is also called "the flu." It is an infection in the lungs, nose, and throat (respiratory tract). It is caused by a virus. The flu causes symptoms that are similar to symptoms of a cold. It also causes a high fever and body aches. The flu spreads easily from person to person (is contagious). Getting a flu shot (influenza vaccination) every year is the best way to prevent the flu. What are the causes? This condition is caused by the influenza virus. You can get the virus by:  Breathing in droplets that are in the air from the cough or sneeze of a person who has the virus.  Touching something that has the virus on it (is contaminated) and then touching your mouth, nose, or eyes. What increases the risk? Certain things may make you more likely to get the flu. These include:  Not washing your hands often.  Having close contact with many people during cold and flu season.  Touching your  mouth, eyes, or nose without first washing your hands.  Not getting a flu shot every year. You may have a higher risk for the flu, along with serious problems such as a lung infection (pneumonia), if you:  Are older than 65.  Are pregnant.  Have a weakened disease-fighting system (immune system) because of a disease or taking certain medicines.  Have a long-term (chronic) illness, such as: ? Heart, kidney, or lung disease. ? Diabetes. ? Asthma.  Have a liver disorder.  Are very overweight (morbidly obese).  Have anemia. This is a condition that affects your red blood cells. What are the signs or symptoms? Symptoms usually begin suddenly and last 4-14 days. They may include:  Fever and chills.  Headaches, body aches, or muscle aches.  Sore throat.  Cough.  Runny or stuffy (congested) nose.  Chest discomfort.  Not wanting to eat as much as normal (poor appetite).  Weakness or feeling tired (fatigue).  Dizziness.  Feeling sick to your stomach (nauseous) or throwing up (vomiting). How is this treated? If the flu is found early, you can be treated with medicine that can help reduce how bad the illness is and how long it lasts (antiviral medicine). This may be given by mouth (orally) or through an IV tube. Taking care of yourself at home can help your symptoms get better. Your doctor may suggest:  Taking over-the-counter medicines.  Drinking plenty of fluids. The flu often  goes away on its own. If you have very bad symptoms or other problems, you may be treated in a hospital. Follow these instructions at home:     Activity  Rest as needed. Get plenty of sleep.  Stay home from work or school as told by your doctor. ? Do not leave home until you do not have a fever for 24 hours without taking medicine. ? Leave home only to visit your doctor. Eating and drinking  Take an ORS (oral rehydration solution). This is a drink that is sold at pharmacies and  stores.  Drink enough fluid to keep your pee (urine) pale yellow.  Drink clear fluids in small amounts as you are able. Clear fluids include: ? Water. ? Ice chips. ? Fruit juice that has water added (diluted fruit juice). ? Low-calorie sports drinks.  Eat bland, easy-to-digest foods in small amounts as you are able. These foods include: ? Bananas. ? Applesauce. ? Rice. ? Lean meats. ? Toast. ? Crackers.  Do not eat or drink: ? Fluids that have a lot of sugar or caffeine. ? Alcohol. ? Spicy or fatty foods. General instructions  Take over-the-counter and prescription medicines only as told by your doctor.  Use a cool mist humidifier to add moisture to the air in your home. This can make it easier for you to breathe.  Cover your mouth and nose when you cough or sneeze.  Wash your hands with soap and water often, especially after you cough or sneeze. If you cannot use soap and water, use alcohol-based hand sanitizer.  Keep all follow-up visits as told by your doctor. This is important. How is this prevented?   Get a flu shot every year. You may get the flu shot in late summer, fall, or winter. Ask your doctor when you should get your flu shot.  Avoid contact with people who are sick during fall and winter (cold and flu season). Contact a doctor if:  You get new symptoms.  You have: ? Chest pain. ? Watery poop (diarrhea). ? A fever.  Your cough gets worse.  You start to have more mucus.  You feel sick to your stomach.  You throw up. Get help right away if you:  Have shortness of breath.  Have trouble breathing.  Have skin or nails that turn a bluish color.  Have very bad pain or stiffness in your neck.  Get a sudden headache.  Get sudden pain in your face or ear.  Cannot eat or drink without throwing up. Summary  Influenza ("the flu") is an infection in the lungs, nose, and throat. It is caused by a virus.  Take over-the-counter and prescription  medicines only as told by your doctor.  Getting a flu shot every year is the best way to avoid getting the flu. This information is not intended to replace advice given to you by your health care provider. Make sure you discuss any questions you have with your health care provider. Document Released: 07/16/2008 Document Revised: 03/25/2018 Document Reviewed: 03/25/2018 Elsevier Interactive Patient Education  2019 [employer].

## 2018-12-11 NOTE — Progress Notes (Signed)
Melissa Pineda 33 y.o.   Chief Complaint  Patient presents with  . Generalized Body Aches    per patient all Sxs started yesterday - sudden  . Fever    HISTORY OF PRESENT ILLNESS: This is a 33 y.o. female complaining of flulike symptoms that started yesterday.  Influenza  This is a new problem. The current episode started yesterday. The problem occurs constantly. The problem has been rapidly worsening. Associated symptoms include congestion, coughing, fatigue, a fever, headaches, myalgias and a sore throat. Pertinent negatives include no abdominal pain, anorexia, arthralgias, change in bowel habit, chest pain, chills, joint swelling, nausea, numbness, rash, swollen glands, urinary symptoms, vertigo, visual change, vomiting or weakness. Nothing aggravates the symptoms. She has tried nothing for the symptoms.     Prior to Admission medications   Medication Sig Start Date End Date Taking? Authorizing Provider  norethindrone-ethinyl estradiol-iron (ESTROSTEP FE,TILIA FE,TRI-LEGEST FE) 1-20/1-30/1-35 MG-MCG tablet Take 1 tablet by mouth daily.   Yes [provider]  Pseudoephedrine HCl (SUDAFED PO) Take by mouth every 4 (four) hours.   Yes [provider]  fluticasone (FLONASE) 50 MCG/ACT nasal spray Place 2 sprays into both nostrils daily. Patient not taking: Reported on 03/04/2017 02/11/17   Forrest Moron, MD  montelukast (SINGULAIR) 10 MG tablet Take 1 tablet (10 mg total) by mouth at bedtime. Patient not taking: Reported on 12/11/2018 03/04/17   Forrest Moron, MD    No Known Allergies  There are no active problems to display for this patient.   No past medical history on file.  No past surgical history on file.  Social History   Socioeconomic History  . Marital status: Single    Spouse name: Not on file  . Number of children: Not on file  . Years of education: Not on file  . Highest education level: Not on file  Occupational History  . Not on file    Social Needs  . Financial resource strain: Not on file  . Food insecurity:    Worry: Not on file    Inability: Not on file  . Transportation needs:    Medical: Not on file    Non-medical: Not on file  Tobacco Use  . Smoking status: Never Smoker  . Smokeless tobacco: Never Used  Substance and Sexual Activity  . Alcohol use: Not on file  . Drug use: Not on file  . Sexual activity: Not on file  Lifestyle  . Physical activity:    Days per week: Not on file    Minutes per session: Not on file  . Stress: Not on file  Relationships  . Social connections:    Talks on phone: Not on file    Gets together: Not on file    Attends religious service: Not on file    Active member of club or organization: Not on file    Attends meetings of clubs or organizations: Not on file    Relationship status: Not on file  . Intimate partner violence:    Fear of current or ex partner: Not on file    Emotionally abused: Not on file    Physically abused: Not on file    Forced sexual activity: Not on file  Other Topics Concern  . Not on file  Social History Narrative  . Not on file    No family history on file.   Review of Systems  Constitutional: Positive for fatigue and fever. Negative for chills.  HENT: Positive for congestion  and sore throat.   Respiratory: Positive for cough.   Cardiovascular: Negative for chest pain.  Gastrointestinal: Negative for abdominal pain, anorexia, change in bowel habit, nausea and vomiting.  Musculoskeletal: Positive for myalgias. Negative for arthralgias and joint swelling.  Skin: Negative for rash.  Neurological: Positive for headaches. Negative for vertigo, weakness and numbness.    Vitals:   12/11/18 1641  BP: 128/84  Pulse: (!) 110  Resp: 16  Temp: 99.5 F (37.5 C)  SpO2: 100%    Physical Exam Vitals signs reviewed.  Constitutional:      Appearance: Normal appearance.  HENT:     Head: Normocephalic and atraumatic.     Nose: Congestion  present.     Mouth/Throat:     Mouth: Mucous membranes are moist.     Pharynx: Oropharynx is clear.  Eyes:     Extraocular Movements: Extraocular movements intact.     Conjunctiva/sclera: Conjunctivae normal.     Pupils: Pupils are equal, round, and reactive to light.  Neck:     Musculoskeletal: Normal range of motion and neck supple.  Cardiovascular:     Rate and Rhythm: Normal rate and regular rhythm.     Heart sounds: Normal heart sounds.  Pulmonary:     Effort: Pulmonary effort is normal.     Breath sounds: Normal breath sounds.  Musculoskeletal: Normal range of motion.  Skin:    General: Skin is warm and dry.     Capillary Refill: Capillary refill takes less than 2 seconds.  Neurological:     General: No focal deficit present.     Mental Status: She is alert and oriented to person, place, and time.  Psychiatric:        Mood and Affect: Mood normal.        Behavior: Behavior normal.    Results for orders placed or performed in visit on 12/11/18 (from the past 24 hour(s))  POCT Influenza A/B     Status: Abnormal   Collection Time: 12/11/18  4:58 PM  Result Value Ref Range   Influenza A, POC     Influenza B, POC Positive (A) Negative     ASSESSMENT & PLAN: Verdene was seen today for generalized body aches and fever.  Diagnoses and all orders for this visit:  Influenza -     Baloxavir Marboxil,80 MG Dose, (XOFLUZA) 2 x 40 MG TBPK; Take 80 mg by mouth once for 1 dose.  Generalized body aches -     POCT Influenza A/B  Other orders -     pseudoephedrine-guaifenesin (MUCINEX D) 60-600 MG 12 hr tablet; Take 1 tablet by mouth every 12 (twelve) hours for 5 days.    Patient Instructions       If you have lab work done today you will be contacted with your lab results within the next 2 weeks.  If you have not heard from [place] then please contact us. The fastest way to get your results is to register for My Chart.   IF you received an x-ray today, you will receive an  invoice from [facility]. Please contact [facility] at [phone] with questions or concerns regarding your invoice.   IF you received labwork today, you will receive an invoice from [place]. Please contact [facility] at [phone] with questions or concerns regarding your invoice.   Our billing staff will not be able to assist you with questions regarding bills from these companies.  You will be contacted with the lab results as soon  as they are available. The fastest way to get your results is to activate your My Chart account. Instructions are located on the last page of this paperwork. If you have not heard from [place] regarding the results in 2 weeks, please contact this office.     Influenza, Adult Influenza is also called "the flu." It is an infection in the lungs, nose, and throat (respiratory tract). It is caused by a virus. The flu causes symptoms that are similar to symptoms of a cold. It also causes a high fever and body aches. The flu spreads easily from person to person (is contagious). Getting a flu shot (influenza vaccination) every year is the best way to prevent the flu. What are the causes? This condition is caused by the influenza virus. You can get the virus by:  Breathing in droplets that are in the air from the cough or sneeze of a person who has the virus.  Touching something that has the virus on it (is contaminated) and then touching your mouth, nose, or eyes. What increases the risk? Certain things may make you more likely to get the flu. These include:  Not washing your hands often.  Having close contact with many people during cold and flu season.  Touching your mouth, eyes, or nose without first washing your hands.  Not getting a flu shot every year. You may have a higher risk for the flu, along with serious problems such as a lung infection (pneumonia), if you:  Are older than 65.  Are pregnant.  Have a weakened disease-fighting  system (immune system) because of a disease or taking certain medicines.  Have a long-term (chronic) illness, such as: ? Heart, kidney, or lung disease. ? Diabetes. ? Asthma.  Have a liver disorder.  Are very overweight (morbidly obese).  Have anemia. This is a condition that affects your red blood cells. What are the signs or symptoms? Symptoms usually begin suddenly and last 4-14 days. They may include:  Fever and chills.  Headaches, body aches, or muscle aches.  Sore throat.  Cough.  Runny or stuffy (congested) nose.  Chest discomfort.  Not wanting to eat as much as normal (poor appetite).  Weakness or feeling tired (fatigue).  Dizziness.  Feeling sick to your stomach (nauseous) or throwing up (vomiting). How is this treated? If the flu is found early, you can be treated with medicine that can help reduce how bad the illness is and how long it lasts (antiviral medicine). This may be given by mouth (orally) or through an IV tube. Taking care of yourself at home can help your symptoms get better. Your doctor may suggest:  Taking over-the-counter medicines.  Drinking plenty of fluids. The flu often goes away on its own. If you have very bad symptoms or other problems, you may be treated in a hospital. Follow these instructions at home:     Activity  Rest as needed. Get plenty of sleep.  Stay home from work or school as told by your doctor. ? Do not leave home until you do not have a fever for 24 hours without taking medicine. ? Leave home only to visit your doctor. Eating and drinking  Take an ORS (oral rehydration solution). This is a drink that is sold at pharmacies and stores.  Drink enough fluid to keep your pee (urine) pale yellow.  Drink clear fluids in small amounts as you are able. Clear fluids include: ? Water. ? Ice chips. ? Fruit juice that has  water added (diluted fruit juice). ? Low-calorie sports drinks.  Eat bland, easy-to-digest foods  in small amounts as you are able. These foods include: ? Bananas. ? Applesauce. ? Rice. ? Lean meats. ? Toast. ? Crackers.  Do not eat or drink: ? Fluids that have a lot of sugar or caffeine. ? Alcohol. ? Spicy or fatty foods. General instructions  Take over-the-counter and prescription medicines only as told by your doctor.  Use a cool mist humidifier to add moisture to the air in your home. This can make it easier for you to breathe.  Cover your mouth and nose when you cough or sneeze.  Wash your hands with soap and water often, especially after you cough or sneeze. If you cannot use soap and water, use alcohol-based hand sanitizer.  Keep all follow-up visits as told by your doctor. This is important. How is this prevented?   Get a flu shot every year. You may get the flu shot in late summer, fall, or winter. Ask your doctor when you should get your flu shot.  Avoid contact with people who are sick during fall and winter (cold and flu season). Contact a doctor if:  You get new symptoms.  You have: ? Chest pain. ? Watery poop (diarrhea). ? A fever.  Your cough gets worse.  You start to have more mucus.  You feel sick to your stomach.  You throw up. Get help right away if you:  Have shortness of breath.  Have trouble breathing.  Have skin or nails that turn a bluish color.  Have very bad pain or stiffness in your neck.  Get a sudden headache.  Get sudden pain in your face or ear.  Cannot eat or drink without throwing up. Summary  Influenza ("the flu") is an infection in the lungs, nose, and throat. It is caused by a virus.  Take over-the-counter and prescription medicines only as told by your doctor.  Getting a flu shot every year is the best way to avoid getting the flu. This information is not intended to replace advice given to you by your health care provider. Make sure you discuss any questions you have with your health care provider. Document  Released: 07/16/2008 Document Revised: 03/25/2018 Document Reviewed: 03/25/2018 Elsevier Interactive Patient Education  2019 [employer]      Agustina Caroli, MD [facility]

## 2019-04-08 DIAGNOSIS — F411 Generalized anxiety disorder: Secondary | ICD-10-CM | POA: Diagnosis not present

## 2019-05-07 DIAGNOSIS — F411 Generalized anxiety disorder: Secondary | ICD-10-CM | POA: Diagnosis not present

## 2019-06-03 DIAGNOSIS — F411 Generalized anxiety disorder: Secondary | ICD-10-CM | POA: Diagnosis not present

## 2019-06-04 DIAGNOSIS — Z6822 Body mass index (BMI) 22.0-22.9, adult: Secondary | ICD-10-CM | POA: Diagnosis not present

## 2019-06-04 DIAGNOSIS — Z124 Encounter for screening for malignant neoplasm of cervix: Secondary | ICD-10-CM | POA: Diagnosis not present

## 2019-06-04 DIAGNOSIS — Z01419 Encounter for gynecological examination (general) (routine) without abnormal findings: Secondary | ICD-10-CM | POA: Diagnosis not present

## 2019-08-24 DIAGNOSIS — N644 Mastodynia: Secondary | ICD-10-CM | POA: Diagnosis not present

## 2019-08-24 DIAGNOSIS — Z6822 Body mass index (BMI) 22.0-22.9, adult: Secondary | ICD-10-CM | POA: Diagnosis not present

## 2019-08-26 DIAGNOSIS — F411 Generalized anxiety disorder: Secondary | ICD-10-CM | POA: Diagnosis not present

## 2019-08-31 ENCOUNTER — Other Ambulatory Visit: Payer: Self-pay | Admitting: Obstetrics and Gynecology

## 2019-08-31 DIAGNOSIS — N644 Mastodynia: Secondary | ICD-10-CM

## 2019-09-10 ENCOUNTER — Ambulatory Visit
Admission: RE | Admit: 2019-09-10 | Discharge: 2019-09-10 | Disposition: A | Discharge status: Home / Self Care | Payer: BC Managed Care – PPO | Source: Ambulatory Visit | Attending: Obstetrics and Gynecology | Admitting: Obstetrics and Gynecology

## 2019-09-10 ENCOUNTER — Other Ambulatory Visit: Payer: Self-pay

## 2019-09-10 DIAGNOSIS — N644 Mastodynia: Secondary | ICD-10-CM

## 2019-09-10 DIAGNOSIS — R922 Inconclusive mammogram: Secondary | ICD-10-CM | POA: Diagnosis not present

## 2019-09-10 DIAGNOSIS — N6322 Unspecified lump in the left breast, upper inner quadrant: Secondary | ICD-10-CM | POA: Diagnosis not present

## 2019-11-25 DIAGNOSIS — F411 Generalized anxiety disorder: Secondary | ICD-10-CM | POA: Diagnosis not present

## 2020-03-02 DIAGNOSIS — F411 Generalized anxiety disorder: Secondary | ICD-10-CM | POA: Diagnosis not present

## 2020-03-14 ENCOUNTER — Other Ambulatory Visit: Payer: Self-pay | Admitting: Obstetrics and Gynecology

## 2020-03-14 DIAGNOSIS — R928 Other abnormal and inconclusive findings on diagnostic imaging of breast: Secondary | ICD-10-CM

## 2020-04-05 ENCOUNTER — Other Ambulatory Visit: Payer: Self-pay

## 2020-04-05 ENCOUNTER — Other Ambulatory Visit: Payer: Self-pay | Admitting: Obstetrics and Gynecology

## 2020-04-05 ENCOUNTER — Ambulatory Visit
Admission: RE | Admit: 2020-04-05 | Discharge: 2020-04-05 | Disposition: A | Discharge status: Home / Self Care | Payer: BC Managed Care – PPO | Source: Ambulatory Visit | Attending: Obstetrics and Gynecology | Admitting: Obstetrics and Gynecology

## 2020-04-05 DIAGNOSIS — N6022 Fibroadenosis of left breast: Secondary | ICD-10-CM | POA: Diagnosis not present

## 2020-04-05 DIAGNOSIS — D242 Benign neoplasm of left breast: Secondary | ICD-10-CM

## 2020-04-05 DIAGNOSIS — R928 Other abnormal and inconclusive findings on diagnostic imaging of breast: Secondary | ICD-10-CM

## 2020-09-06 DIAGNOSIS — F411 Generalized anxiety disorder: Secondary | ICD-10-CM | POA: Diagnosis not present

## 2020-10-06 ENCOUNTER — Other Ambulatory Visit: Payer: BC Managed Care – PPO

## 2020-10-19 ENCOUNTER — Other Ambulatory Visit: Payer: Self-pay

## 2020-10-19 ENCOUNTER — Ambulatory Visit
Admission: RE | Admit: 2020-10-19 | Discharge: 2020-10-19 | Disposition: A | Discharge status: Home / Self Care | Payer: BC Managed Care – PPO | Source: Ambulatory Visit | Attending: Obstetrics and Gynecology | Admitting: Obstetrics and Gynecology

## 2020-10-19 DIAGNOSIS — D242 Benign neoplasm of left breast: Secondary | ICD-10-CM

## 2020-10-19 DIAGNOSIS — N6322 Unspecified lump in the left breast, upper inner quadrant: Secondary | ICD-10-CM | POA: Diagnosis not present

## 2020-10-24 DIAGNOSIS — F411 Generalized anxiety disorder: Secondary | ICD-10-CM | POA: Diagnosis not present

## 2020-11-20 ENCOUNTER — Ambulatory Visit: Payer: 59 | Admitting: Emergency Medicine

## 2020-11-24 ENCOUNTER — Other Ambulatory Visit: Payer: Self-pay

## 2020-11-24 ENCOUNTER — Encounter: Payer: Self-pay | Admitting: Family Medicine

## 2020-11-24 ENCOUNTER — Ambulatory Visit (INDEPENDENT_AMBULATORY_CARE_PROVIDER_SITE_OTHER): Payer: BC Managed Care – PPO | Admitting: Family Medicine

## 2020-11-24 VITALS — BP 120/78 | HR 65 | Temp 98.0°F | Resp 15 | Ht 66.0 in | Wt 122.8 lb

## 2020-11-24 DIAGNOSIS — Z0000 Encounter for general adult medical examination without abnormal findings: Secondary | ICD-10-CM | POA: Diagnosis not present

## 2020-11-24 DIAGNOSIS — Z23 Encounter for immunization: Secondary | ICD-10-CM

## 2020-11-24 LAB — CBC WITH DIFFERENTIAL/PLATELET
Basos: 1 %
Eos: 2 %
Hemoglobin: 12.6 g/dL (ref 11.1–15.9)
Immature Granulocytes: 0 %
MCV: 92 fL (ref 79–97)
Neutrophils: 49 %
Platelets: 314 10*3/uL (ref 150–450)

## 2020-11-24 LAB — LIPID PANEL

## 2020-11-24 LAB — COMPREHENSIVE METABOLIC PANEL

## 2020-11-24 LAB — TSH

## 2020-11-24 LAB — HIV ANTIBODY (ROUTINE TESTING W REFLEX)

## 2020-11-24 NOTE — Progress Notes (Signed)
Patient ID: Melissa Pineda, female    DOB: [birth year]  Age: 35 y.o. MRN: [id number]  Chief Complaint  Patient presents with  . Annual Exam    Pt doing well has been having trouble with stomach upset on and off for some time, pt would like to consider referral to Gastro to have this examined     Subjective:   35 year old lady who is here for annual physical examination.  She has no major complaints.  Actually she has not had a general physical exam in recent years.  Past medical history: Operations none Major medical illnesses: None Immunizations: First 2 COVID vaccines Medications: None regularly.  Takes seasonal allergy medicines including Allegra, Flonase, and Singulair.  Is no longer on the birth control pill.  Social history: Does not smoke.  Drinks a glass of wine every day and occasionally heavier at parties.  Has used some weed recreationally in the distant past, none currently. Married for 5 years.  Has a degree in international business.  She works along [employer].  Is not get a lot of regular exercise but once a week.  Family history: Father 21 in good health, mother 72 in good health.  2 siblings living well.  Review of systems: Constitutional: Unremarkable HEENT: Wears some distance glasses.  Otherwise normal.  Neurologic: Occasional headaches behind her nose in the back of her head.  Some of these sound migraine-like. Cardiovascular: Unremarkable Respiratory: Unremarkable GI: Occasional heartburn GU: Unremarkable Musculoskeletal: Unremarkable Dermatologic: Unremarkable Neurologic: Unremarkable Psychiatric: Mild anxiety and depression.  Sees a therapist Endocrine: Unremarkable   Current allergies, medications, problem list, past/family and social histories reviewed.  Objective:  BP 120/78   Pulse 65   Temp 98 F (36.7 C) (Temporal)   Resp 15   Ht 5\' 6"  (1.676 m)   Wt 122 lb 12.8 oz (55.7 kg)   SpO2 100%   BMI 19.82 kg/m   Pleasant lady alert and oriented  no acute distress.  TMs normal.  Eyes PERRL.  Teeth good.  Throat clear.  Neck supple without nodes or thyromegaly.  No carotid bruits.  Chest is clear to auscultation.  Heart rate without murmurs, gallops, or arrhythmias.  Abdomen soft without mass or tenderness.  No axillary or inguinal nodes.  Extremities unremarkable.  Skin unremarkable.  Spine normal.  Assessment & Plan:   Assessment: 1. Annual physical exam       Plan: Screening labs  Orders Placed This Encounter  Procedures  . Flu Vaccine QUAD 6+ mos PF IM (Fluarix Quad PF)  . Lipid panel  . CBC with Differential/Platelet  . Hemoglobin A1c  . Comprehensive metabolic panel  . TSH  . HIV antibody (with reflex)  . Hepatitis C antibody    No orders of the defined types were placed in this encounter.        Patient Instructions   You appear to be very physically healthy.  We will be checking screening labs on you but I expect those will be good.  You should get the report sometime next week.  Consider working on tapering away from the regular glass of wine as you are approaching a desired pregnancy.  Get more regular exercise.  The American Heart Association would recommend 30 minutes of active exercise at least 5 days a week.  Remember to seek to be physically, emotionally, relationally, and spiritually healthy.  Return as needed to see Dr. Mitchel Honour who is listed as your primary care.  Unfortunately Primary Care at [facility] is  going to be closed by [facility] in the next 2 months.  Dr. Mitchel Honour intends to his practice to [facility] on [facility] and his patient should just be able to make a lateral move over there to him.  [facility] is supposed to send out a notice to patient sometime in the near future.   If you have lab work done today you will be contacted with your lab results within the next 2 weeks.  If you have not heard from [place] then please contact us. The fastest way to get your results is to  register for My Chart.   IF you received an x-ray today, you will receive an invoice from [facility]. Please contact [facility] at [phone] with questions or concerns regarding your invoice.   IF you received labwork today, you will receive an invoice from [facility]. Please contact [facility] at [phone] with questions or concerns regarding your invoice.   Our billing staff will not be able to assist you with questions regarding bills from these companies.  You will be contacted with the lab results as soon as they are available. The fastest way to get your results is to activate your My Chart account. Instructions are located on the last page of this paperwork. If you have not heard from [place] regarding the results in 2 weeks, please contact this office.        Return in about 1 year (around 11/24/2021), or if symptoms worsen or fail to improve.   Ruben Reason, MD 11/24/2020

## 2020-11-24 NOTE — Patient Instructions (Addendum)
You appear to be very physically healthy.  We will be checking screening labs on you but I expect those will be good.  You should get the report sometime next week.  Consider working on tapering away from the regular glass of wine as you are approaching a desired pregnancy.  Get more regular exercise.  The American Heart Association would recommend 30 minutes of active exercise at least 5 days a week.  Remember to seek to be physically, emotionally, relationally, and spiritually healthy.  Return as needed to see Dr. Mitchel Honour who is listed as your primary care.  Unfortunately Primary Care at [facility] is going to be closed by [facility] in the next 2 months.  Dr. Mitchel Honour intends to his practice to [facility] on [address] and his patient should just be able to make a lateral move over there to him.  [facility] is supposed to send out a notice to patient sometime in the near future.   If you have lab work done today you will be contacted with your lab results within the next 2 weeks.  If you have not heard from [place] then please contact us. The fastest way to get your results is to register for My Chart.   IF you received an x-ray today, you will receive an invoice from [facility] Radiology. Please contact [facility] Radiology at [phone] with questions or concerns regarding your invoice.   IF you received labwork today, you will receive an invoice from [facility]. Please contact [facility] at [phone] with questions or concerns regarding your invoice.   Our billing staff will not be able to assist you with questions regarding bills from these companies.  You will be contacted with the lab results as soon as they are available. The fastest way to get your results is to activate your My Chart account. Instructions are located on the last page of this paperwork. If you have not heard from [place] regarding the results in 2 weeks, please contact this office.

## 2020-11-25 LAB — COMPREHENSIVE METABOLIC PANEL
ALT: 11 IU/L (ref 0–32)
AST: 15 IU/L (ref 0–40)
Albumin/Globulin Ratio: 1.9 (ref 1.2–2.2)
Albumin: 5 g/dL — ABNORMAL HIGH (ref 3.8–4.8)
Alkaline Phosphatase: 56 IU/L (ref 44–121)
BUN: 7 mg/dL (ref 6–20)
Bilirubin Total: 1.5 mg/dL — ABNORMAL HIGH (ref 0.0–1.2)
CO2: 23 mmol/L (ref 20–29)
GFR calc Af Amer: 119 mL/min/{1.73_m2} (ref >59)
GFR calc non Af Amer: 103 mL/min/{1.73_m2} (ref >59)
Globulin, Total: 2.7 g/dL (ref 1.5–4.5)
Glucose: 99 mg/dL (ref 65–99)
Potassium: 4.1 mmol/L (ref 3.5–5.2)
Total Protein: 7.7 g/dL (ref 6.0–8.5)

## 2020-11-25 LAB — LIPID PANEL
Chol/HDL Ratio: 2.3 ratio (ref 0.0–4.4)
Cholesterol, Total: 171 mg/dL (ref 100–199)
Triglycerides: 45 mg/dL (ref 0–149)

## 2020-11-25 LAB — CBC WITH DIFFERENTIAL/PLATELET
Basophils Absolute: 0 10*3/uL (ref 0.0–0.2)
EOS (ABSOLUTE): 0.2 10*3/uL (ref 0.0–0.4)
Hematocrit: 38 % (ref 34.0–46.6)
Immature Grans (Abs): 0 10*3/uL (ref 0.0–0.1)
Lymphocytes Absolute: 2.7 10*3/uL (ref 0.7–3.1)
Lymphs: 40 %
MCH: 30.4 pg (ref 26.6–33.0)
MCHC: 33.2 g/dL (ref 31.5–35.7)
Monocytes Absolute: 0.6 10*3/uL (ref 0.1–0.9)
Monocytes: 8 %
Neutrophils Absolute: 3.2 10*3/uL (ref 1.4–7.0)
RBC: 4.14 x10E6/uL (ref 3.77–5.28)
RDW: 11.8 % (ref 11.7–15.4)
WBC: 6.6 10*3/uL (ref 3.4–10.8)

## 2020-11-25 LAB — HEMOGLOBIN A1C
Est. average glucose Bld gHb Est-mCnc: 97 mg/dL
Hgb A1c MFr Bld: 5 % (ref 4.8–5.6)

## 2020-11-25 LAB — HEPATITIS C ANTIBODY: Hep C Virus Ab: <0.1 s/co ratio (ref 0.0–0.9)

## 2020-11-28 DIAGNOSIS — Z681 Body mass index (BMI) 19 or less, adult: Secondary | ICD-10-CM | POA: Diagnosis not present

## 2020-11-28 DIAGNOSIS — Z124 Encounter for screening for malignant neoplasm of cervix: Secondary | ICD-10-CM | POA: Diagnosis not present

## 2020-11-28 DIAGNOSIS — Z01419 Encounter for gynecological examination (general) (routine) without abnormal findings: Secondary | ICD-10-CM | POA: Diagnosis not present

## 2020-12-04 DIAGNOSIS — F411 Generalized anxiety disorder: Secondary | ICD-10-CM | POA: Diagnosis not present

## 2020-12-29 DIAGNOSIS — N72 Inflammatory disease of cervix uteri: Secondary | ICD-10-CM | POA: Diagnosis not present

## 2020-12-29 DIAGNOSIS — Z3202 Encounter for pregnancy test, result negative: Secondary | ICD-10-CM | POA: Diagnosis not present

## 2020-12-29 DIAGNOSIS — R87810 Cervical high risk human papillomavirus (HPV) DNA test positive: Secondary | ICD-10-CM | POA: Diagnosis not present

## 2021-01-03 DIAGNOSIS — F411 Generalized anxiety disorder: Secondary | ICD-10-CM | POA: Diagnosis not present

## 2021-03-22 DIAGNOSIS — N925 Other specified irregular menstruation: Secondary | ICD-10-CM | POA: Diagnosis not present

## 2021-03-22 DIAGNOSIS — Z3490 Encounter for supervision of normal pregnancy, unspecified, unspecified trimester: Secondary | ICD-10-CM | POA: Diagnosis not present

## 2021-03-22 DIAGNOSIS — Z3201 Encounter for pregnancy test, result positive: Secondary | ICD-10-CM | POA: Diagnosis not present

## 2021-03-22 DIAGNOSIS — Z113 Encounter for screening for infections with a predominantly sexual mode of transmission: Secondary | ICD-10-CM | POA: Diagnosis not present

## 2021-04-02 DIAGNOSIS — Z369 Encounter for antenatal screening, unspecified: Secondary | ICD-10-CM | POA: Diagnosis not present

## 2021-04-02 DIAGNOSIS — Z3480 Encounter for supervision of other normal pregnancy, unspecified trimester: Secondary | ICD-10-CM | POA: Diagnosis not present

## 2021-04-02 DIAGNOSIS — Z3481 Encounter for supervision of other normal pregnancy, first trimester: Secondary | ICD-10-CM | POA: Diagnosis not present

## 2021-04-02 DIAGNOSIS — Z31430 Encounter of female for testing for genetic disease carrier status for procreative management: Secondary | ICD-10-CM | POA: Diagnosis not present

## 2021-04-02 LAB — OB RESULTS CONSOLE ANTIBODY SCREEN: Antibody Screen: NEGATIVE

## 2021-04-02 LAB — OB RESULTS CONSOLE GC/CHLAMYDIA
Chlamydia: NEGATIVE
Gonorrhea: NEGATIVE

## 2021-04-02 LAB — OB RESULTS CONSOLE ABO/RH: RH Type: POSITIVE

## 2021-04-02 LAB — OB RESULTS CONSOLE HEPATITIS B SURFACE ANTIGEN: Hepatitis B Surface Ag: NEGATIVE

## 2021-04-02 LAB — OB RESULTS CONSOLE RUBELLA ANTIBODY, IGM: Rubella: IMMUNE

## 2021-04-02 LAB — OB RESULTS CONSOLE HIV ANTIBODY (ROUTINE TESTING): HIV: NONREACTIVE

## 2021-04-02 LAB — OB RESULTS CONSOLE RPR: RPR: NONREACTIVE

## 2021-04-02 LAB — HEPATITIS C ANTIBODY: HCV Ab: NEGATIVE

## 2021-05-09 DIAGNOSIS — Z3482 Encounter for supervision of other normal pregnancy, second trimester: Secondary | ICD-10-CM | POA: Diagnosis not present

## 2021-05-09 DIAGNOSIS — Z369 Encounter for antenatal screening, unspecified: Secondary | ICD-10-CM | POA: Diagnosis not present

## 2021-06-12 DIAGNOSIS — Z3A20 20 weeks gestation of pregnancy: Secondary | ICD-10-CM | POA: Diagnosis not present

## 2021-06-12 DIAGNOSIS — Z363 Encounter for antenatal screening for malformations: Secondary | ICD-10-CM | POA: Diagnosis not present

## 2021-06-28 DIAGNOSIS — Z369 Encounter for antenatal screening, unspecified: Secondary | ICD-10-CM | POA: Diagnosis not present

## 2021-06-28 DIAGNOSIS — Z3A22 22 weeks gestation of pregnancy: Secondary | ICD-10-CM | POA: Diagnosis not present

## 2021-07-11 DIAGNOSIS — F411 Generalized anxiety disorder: Secondary | ICD-10-CM | POA: Diagnosis not present

## 2021-07-25 DIAGNOSIS — Z23 Encounter for immunization: Secondary | ICD-10-CM | POA: Diagnosis not present

## 2021-07-25 DIAGNOSIS — Z3480 Encounter for supervision of other normal pregnancy, unspecified trimester: Secondary | ICD-10-CM | POA: Diagnosis not present

## 2021-07-28 DIAGNOSIS — Z20822 Contact with and (suspected) exposure to covid-19: Secondary | ICD-10-CM | POA: Diagnosis not present

## 2021-08-08 DIAGNOSIS — O99810 Abnormal glucose complicating pregnancy: Secondary | ICD-10-CM | POA: Diagnosis not present

## 2021-08-08 DIAGNOSIS — Z3A28 28 weeks gestation of pregnancy: Secondary | ICD-10-CM | POA: Diagnosis not present

## 2021-08-15 DIAGNOSIS — F411 Generalized anxiety disorder: Secondary | ICD-10-CM | POA: Diagnosis not present

## 2021-08-20 DIAGNOSIS — M62830 Muscle spasm of back: Secondary | ICD-10-CM | POA: Diagnosis not present

## 2021-08-20 DIAGNOSIS — M9903 Segmental and somatic dysfunction of lumbar region: Secondary | ICD-10-CM | POA: Diagnosis not present

## 2021-08-20 DIAGNOSIS — M9901 Segmental and somatic dysfunction of cervical region: Secondary | ICD-10-CM | POA: Diagnosis not present

## 2021-08-20 DIAGNOSIS — M542 Cervicalgia: Secondary | ICD-10-CM | POA: Diagnosis not present

## 2021-08-22 DIAGNOSIS — M542 Cervicalgia: Secondary | ICD-10-CM | POA: Diagnosis not present

## 2021-08-22 DIAGNOSIS — M9903 Segmental and somatic dysfunction of lumbar region: Secondary | ICD-10-CM | POA: Diagnosis not present

## 2021-08-22 DIAGNOSIS — M62830 Muscle spasm of back: Secondary | ICD-10-CM | POA: Diagnosis not present

## 2021-08-22 DIAGNOSIS — M9901 Segmental and somatic dysfunction of cervical region: Secondary | ICD-10-CM | POA: Diagnosis not present

## 2021-08-23 DIAGNOSIS — Z23 Encounter for immunization: Secondary | ICD-10-CM | POA: Diagnosis not present

## 2021-08-27 DIAGNOSIS — M9901 Segmental and somatic dysfunction of cervical region: Secondary | ICD-10-CM | POA: Diagnosis not present

## 2021-08-27 DIAGNOSIS — M542 Cervicalgia: Secondary | ICD-10-CM | POA: Diagnosis not present

## 2021-08-27 DIAGNOSIS — M62830 Muscle spasm of back: Secondary | ICD-10-CM | POA: Diagnosis not present

## 2021-08-27 DIAGNOSIS — M9903 Segmental and somatic dysfunction of lumbar region: Secondary | ICD-10-CM | POA: Diagnosis not present

## 2021-08-29 DIAGNOSIS — M62830 Muscle spasm of back: Secondary | ICD-10-CM | POA: Diagnosis not present

## 2021-08-29 DIAGNOSIS — M9903 Segmental and somatic dysfunction of lumbar region: Secondary | ICD-10-CM | POA: Diagnosis not present

## 2021-08-29 DIAGNOSIS — M9901 Segmental and somatic dysfunction of cervical region: Secondary | ICD-10-CM | POA: Diagnosis not present

## 2021-08-29 DIAGNOSIS — M542 Cervicalgia: Secondary | ICD-10-CM | POA: Diagnosis not present

## 2021-09-03 DIAGNOSIS — M9901 Segmental and somatic dysfunction of cervical region: Secondary | ICD-10-CM | POA: Diagnosis not present

## 2021-09-03 DIAGNOSIS — M62830 Muscle spasm of back: Secondary | ICD-10-CM | POA: Diagnosis not present

## 2021-09-03 DIAGNOSIS — M542 Cervicalgia: Secondary | ICD-10-CM | POA: Diagnosis not present

## 2021-09-03 DIAGNOSIS — M9903 Segmental and somatic dysfunction of lumbar region: Secondary | ICD-10-CM | POA: Diagnosis not present

## 2021-09-05 DIAGNOSIS — M542 Cervicalgia: Secondary | ICD-10-CM | POA: Diagnosis not present

## 2021-09-05 DIAGNOSIS — M9903 Segmental and somatic dysfunction of lumbar region: Secondary | ICD-10-CM | POA: Diagnosis not present

## 2021-09-05 DIAGNOSIS — M9901 Segmental and somatic dysfunction of cervical region: Secondary | ICD-10-CM | POA: Diagnosis not present

## 2021-09-05 DIAGNOSIS — M62830 Muscle spasm of back: Secondary | ICD-10-CM | POA: Diagnosis not present

## 2021-09-10 DIAGNOSIS — M62830 Muscle spasm of back: Secondary | ICD-10-CM | POA: Diagnosis not present

## 2021-09-10 DIAGNOSIS — M9901 Segmental and somatic dysfunction of cervical region: Secondary | ICD-10-CM | POA: Diagnosis not present

## 2021-09-10 DIAGNOSIS — M542 Cervicalgia: Secondary | ICD-10-CM | POA: Diagnosis not present

## 2021-09-10 DIAGNOSIS — M9903 Segmental and somatic dysfunction of lumbar region: Secondary | ICD-10-CM | POA: Diagnosis not present

## 2021-09-11 DIAGNOSIS — M62830 Muscle spasm of back: Secondary | ICD-10-CM | POA: Diagnosis not present

## 2021-09-11 DIAGNOSIS — M9901 Segmental and somatic dysfunction of cervical region: Secondary | ICD-10-CM | POA: Diagnosis not present

## 2021-09-11 DIAGNOSIS — M9903 Segmental and somatic dysfunction of lumbar region: Secondary | ICD-10-CM | POA: Diagnosis not present

## 2021-09-11 DIAGNOSIS — M542 Cervicalgia: Secondary | ICD-10-CM | POA: Diagnosis not present

## 2021-09-12 DIAGNOSIS — Z369 Encounter for antenatal screening, unspecified: Secondary | ICD-10-CM | POA: Diagnosis not present

## 2021-09-17 DIAGNOSIS — M9901 Segmental and somatic dysfunction of cervical region: Secondary | ICD-10-CM | POA: Diagnosis not present

## 2021-09-17 DIAGNOSIS — M542 Cervicalgia: Secondary | ICD-10-CM | POA: Diagnosis not present

## 2021-09-17 DIAGNOSIS — M9903 Segmental and somatic dysfunction of lumbar region: Secondary | ICD-10-CM | POA: Diagnosis not present

## 2021-09-17 DIAGNOSIS — M62830 Muscle spasm of back: Secondary | ICD-10-CM | POA: Diagnosis not present

## 2021-09-20 DIAGNOSIS — F411 Generalized anxiety disorder: Secondary | ICD-10-CM | POA: Diagnosis not present

## 2021-09-24 DIAGNOSIS — M62830 Muscle spasm of back: Secondary | ICD-10-CM | POA: Diagnosis not present

## 2021-09-24 DIAGNOSIS — M9901 Segmental and somatic dysfunction of cervical region: Secondary | ICD-10-CM | POA: Diagnosis not present

## 2021-09-24 DIAGNOSIS — M9903 Segmental and somatic dysfunction of lumbar region: Secondary | ICD-10-CM | POA: Diagnosis not present

## 2021-09-24 DIAGNOSIS — M542 Cervicalgia: Secondary | ICD-10-CM | POA: Diagnosis not present

## 2021-09-26 DIAGNOSIS — M9901 Segmental and somatic dysfunction of cervical region: Secondary | ICD-10-CM | POA: Diagnosis not present

## 2021-09-26 DIAGNOSIS — M542 Cervicalgia: Secondary | ICD-10-CM | POA: Diagnosis not present

## 2021-09-26 DIAGNOSIS — M62830 Muscle spasm of back: Secondary | ICD-10-CM | POA: Diagnosis not present

## 2021-09-26 DIAGNOSIS — M9903 Segmental and somatic dysfunction of lumbar region: Secondary | ICD-10-CM | POA: Diagnosis not present

## 2021-10-02 DIAGNOSIS — M62830 Muscle spasm of back: Secondary | ICD-10-CM | POA: Diagnosis not present

## 2021-10-02 DIAGNOSIS — M9901 Segmental and somatic dysfunction of cervical region: Secondary | ICD-10-CM | POA: Diagnosis not present

## 2021-10-02 DIAGNOSIS — M542 Cervicalgia: Secondary | ICD-10-CM | POA: Diagnosis not present

## 2021-10-02 DIAGNOSIS — M9903 Segmental and somatic dysfunction of lumbar region: Secondary | ICD-10-CM | POA: Diagnosis not present

## 2021-10-03 DIAGNOSIS — M542 Cervicalgia: Secondary | ICD-10-CM | POA: Diagnosis not present

## 2021-10-03 DIAGNOSIS — Z3480 Encounter for supervision of other normal pregnancy, unspecified trimester: Secondary | ICD-10-CM | POA: Diagnosis not present

## 2021-10-03 DIAGNOSIS — M9903 Segmental and somatic dysfunction of lumbar region: Secondary | ICD-10-CM | POA: Diagnosis not present

## 2021-10-03 DIAGNOSIS — M9901 Segmental and somatic dysfunction of cervical region: Secondary | ICD-10-CM | POA: Diagnosis not present

## 2021-10-03 DIAGNOSIS — M62830 Muscle spasm of back: Secondary | ICD-10-CM | POA: Diagnosis not present

## 2021-10-03 DIAGNOSIS — Z369 Encounter for antenatal screening, unspecified: Secondary | ICD-10-CM | POA: Diagnosis not present

## 2021-10-03 LAB — OB RESULTS CONSOLE GBS: GBS: NEGATIVE

## 2021-10-06 IMAGING — US US BREAST*L* LIMITED INC AXILLA
1 series · 8 of 8 positions shown · non-contrast
Comparison: None.

CLINICAL DATA: Shooting pains in the left axilla and inferior left
breast from the inframammary fold to the nipple for the 2 months.
Fullness in the retro nipple region. No family history of breast
cancer.

EXAM:
DIGITAL DIAGNOSTIC BILATERAL MAMMOGRAM WITH CAD AND TOMO
ULTRASOUND LEFT BREAST

[Series 1: us breast*left* limited inc axilla · 0.07mm/px · 8 of 8 slices shown]
[im 1/8]
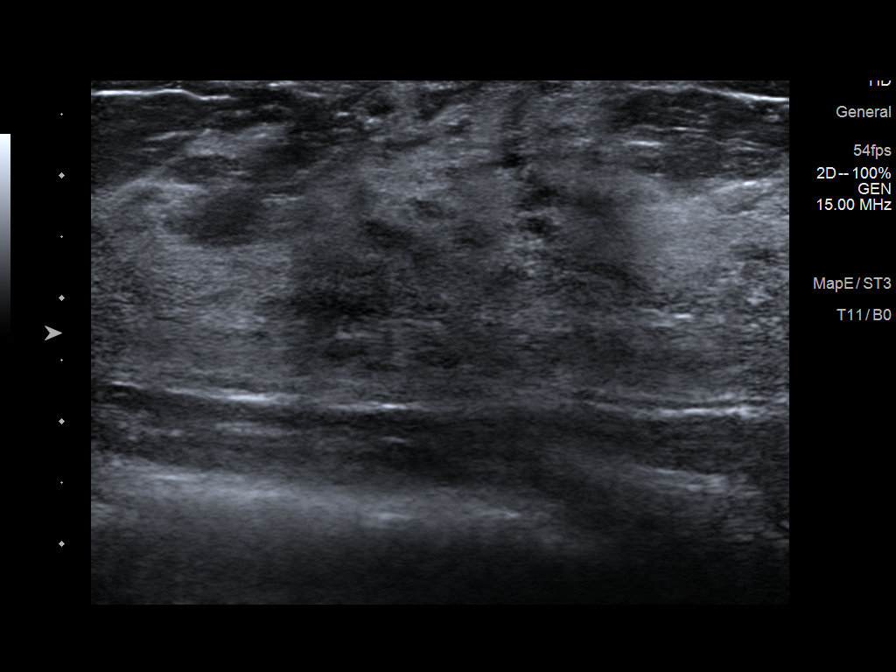
[im 2/8]
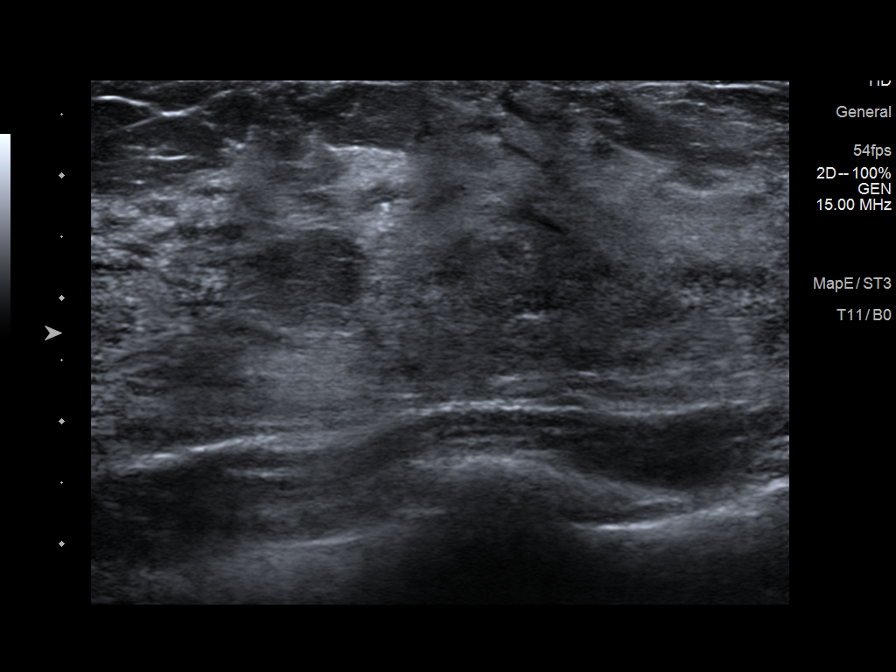
[im 3/8]
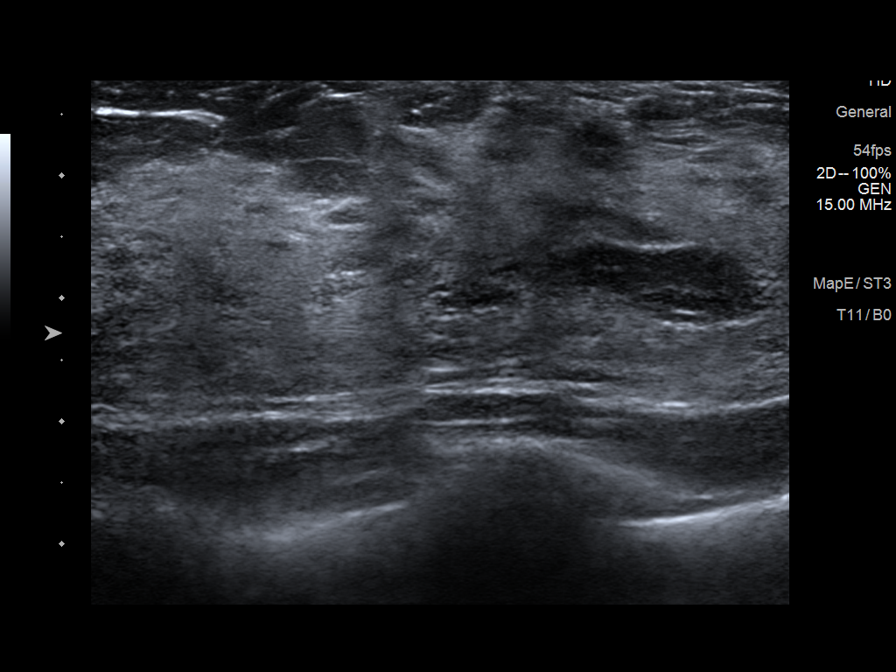
[im 4/8]
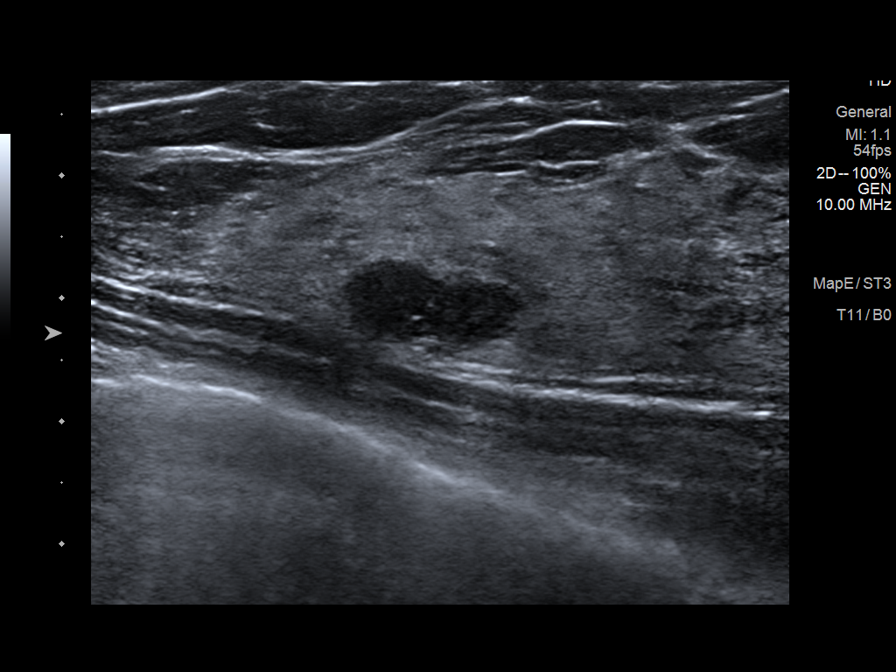
[im 5/8]
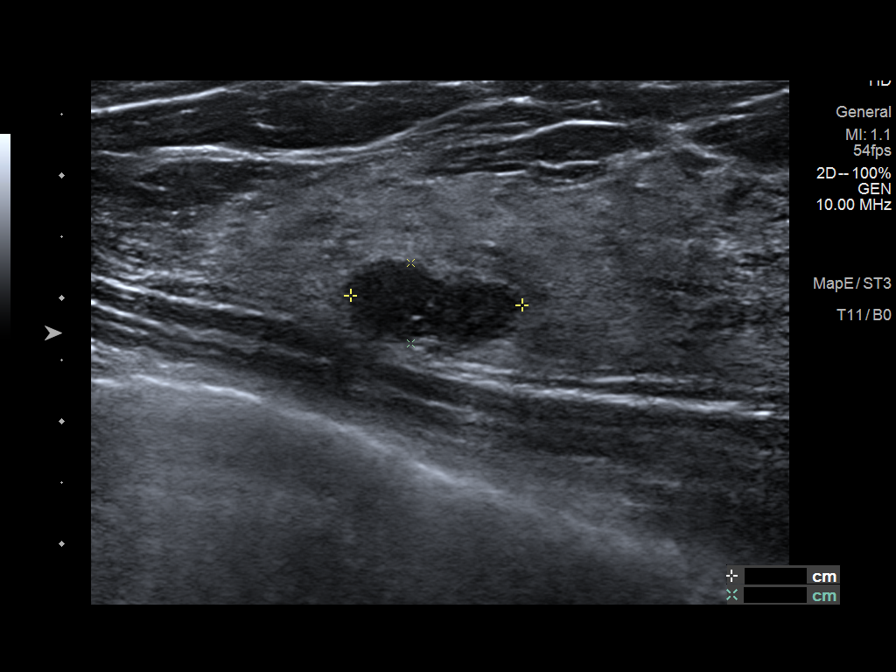
[im 6/8]
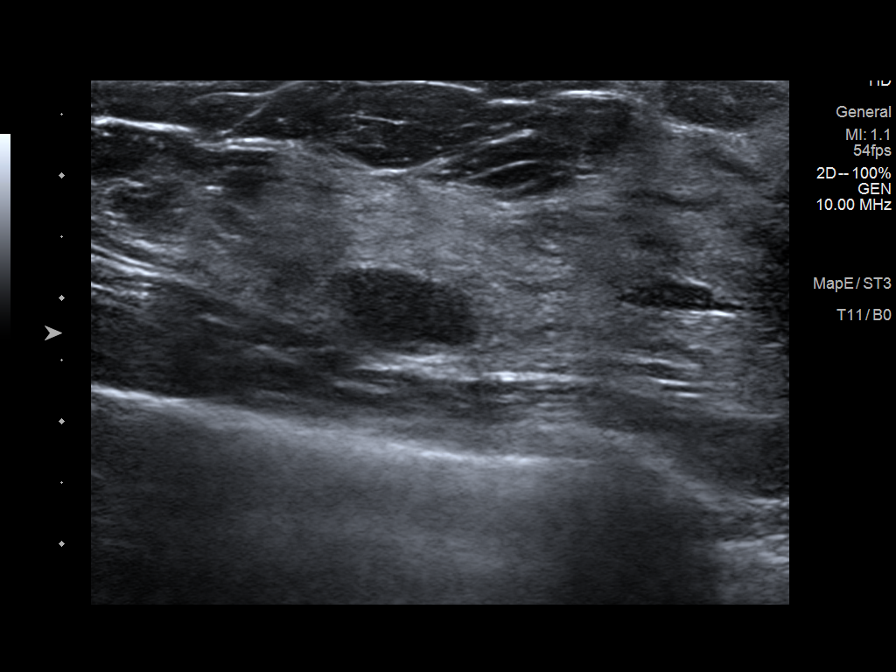
[im 7/8]
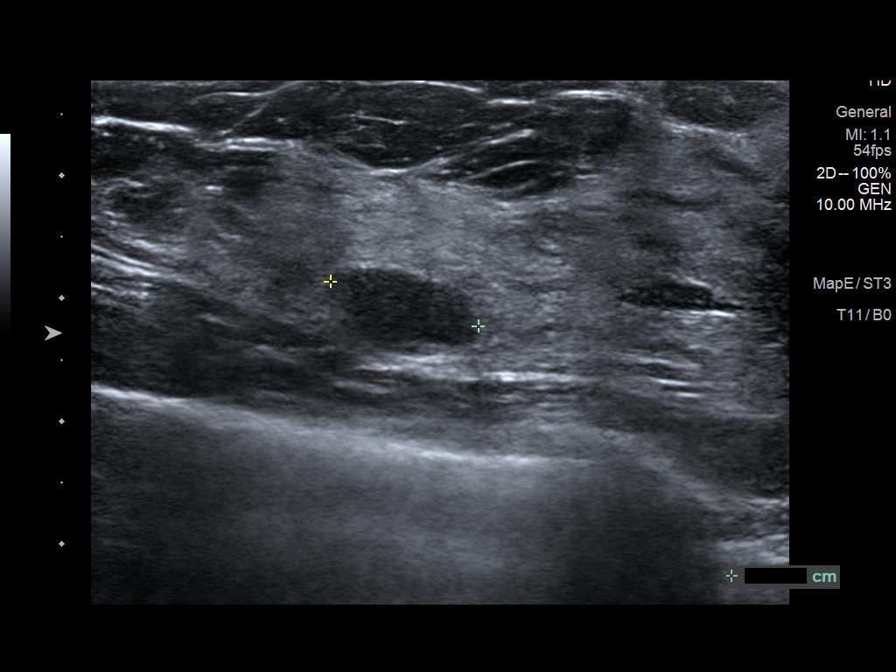
[im 8/8]
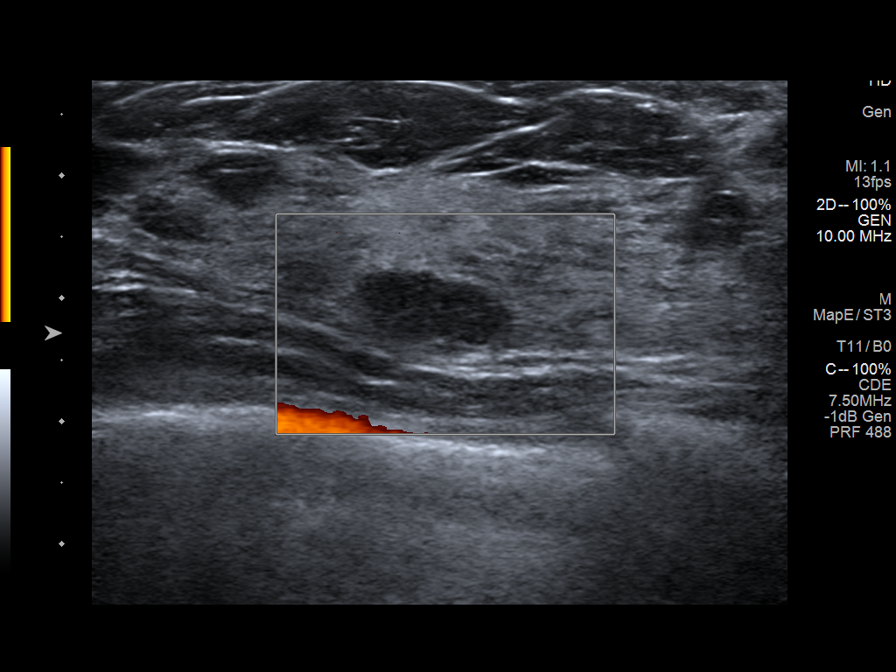

[8 of 8 positions shown; findings below may reference images not displayed]

ACR Breast Density Category d: The breast tissue is extremely dense,
which lowers the sensitivity of mammography.
FINDINGS: Unremarkable breasts with no findings suspicious for malignancy in
either breast.

Mammographic images were processed with CAD.

On physical exam, no mass is palpable in the left breast in the
areas of patient concern. The left nipple and areola have normal
appearances, symmetrical to the right.

Targeted ultrasound is performed, showing a 1.4 x 1.3 x 0.7 cm oval,
horizontally oriented, hypoechoic, circumscribed mass in the 10
o'clock position of the left breast, 1 cm from the nipple. Normal
appearing breast tissue is demonstrated throughout the remainder of
the left breast in the areas of patient concern and normal appearing
left axillary contents are noted.
IMPRESSION: 1. 1.4 cm probable fibroadenoma in the 10 o'clock position of the
left breast.
2. Otherwise, unremarkable examination.

RECOMMENDATION:
Left breast ultrasound in 6 months. The option of ultrasound-guided
core needle biopsy was also discussed with the patient but not
recommended at this time. She is currently comfortable with the 6
month followup ultrasound.

I have discussed the findings and recommendations with the patient.
If applicable, a reminder letter will be sent to the patient
regarding the next appointment.

BI-RADS CATEGORY  3: Probably benign.

## 2021-10-08 DIAGNOSIS — M542 Cervicalgia: Secondary | ICD-10-CM | POA: Diagnosis not present

## 2021-10-08 DIAGNOSIS — M62830 Muscle spasm of back: Secondary | ICD-10-CM | POA: Diagnosis not present

## 2021-10-08 DIAGNOSIS — M9903 Segmental and somatic dysfunction of lumbar region: Secondary | ICD-10-CM | POA: Diagnosis not present

## 2021-10-08 DIAGNOSIS — M9901 Segmental and somatic dysfunction of cervical region: Secondary | ICD-10-CM | POA: Diagnosis not present

## 2021-10-10 DIAGNOSIS — Z369 Encounter for antenatal screening, unspecified: Secondary | ICD-10-CM | POA: Diagnosis not present

## 2021-10-17 DIAGNOSIS — M542 Cervicalgia: Secondary | ICD-10-CM | POA: Diagnosis not present

## 2021-10-17 DIAGNOSIS — M62830 Muscle spasm of back: Secondary | ICD-10-CM | POA: Diagnosis not present

## 2021-10-17 DIAGNOSIS — M9903 Segmental and somatic dysfunction of lumbar region: Secondary | ICD-10-CM | POA: Diagnosis not present

## 2021-10-17 DIAGNOSIS — M9901 Segmental and somatic dysfunction of cervical region: Secondary | ICD-10-CM | POA: Diagnosis not present

## 2021-10-19 DIAGNOSIS — Z369 Encounter for antenatal screening, unspecified: Secondary | ICD-10-CM | POA: Diagnosis not present

## 2021-10-21 NOTE — L&D Delivery Note (Signed)
Delivery Note At 5:07 PM a viable and healthy female was delivered via Vaginal, Spontaneous (Presentation: Left   Occiput Anterior).  APGAR: 9, 9; weight 9 lb 3.8 oz (4190 g).   Placenta status: Spontaneous, Intact.  Cord: 3 vessels  The patient pushed for 24 minutes and delivered a vigorous female infant in the vertex left occiput anterior presentation with Apgar scores of 9 at 1 minute and 9 at 5 minutes.  The infant was immediately passed to the waiting maternal abdomen.  After 1 minute delay, the cord was clamped and cut.  The placenta delivered spontaneously, intact, with three-vessel cord.  A partial third-degree perineal laceration involving less than 50% of the external sphincter was repaired with 0 Vicryl interrupted sutures.  A rectal exam was performed to confirm the external sphincter was intact.  The remaining second-degree perineal laceration was repaired with 3-0 Vicryl.  All sponge, needle, instrument counts were correct.  EBL 447 cc.  Mother and baby are doing well following delivery  Anesthesia: Epidural Episiotomy: None Lacerations: 3rd degree;Perineal, less than 50% of external anal sphincter Suture Repair: 3.0 vicryl and 0 vicryl Est. Blood Loss (mL): 31  Mom to postpartum.  Baby to Couplet care / Skin to Skin.  [name] 10/30/2021, 5:53 PM

## 2021-10-24 DIAGNOSIS — Z369 Encounter for antenatal screening, unspecified: Secondary | ICD-10-CM | POA: Diagnosis not present

## 2021-10-25 ENCOUNTER — Encounter (HOSPITAL_COMMUNITY): Payer: Self-pay | Admitting: *Deleted

## 2021-10-25 ENCOUNTER — Telehealth (HOSPITAL_COMMUNITY): Payer: Self-pay | Admitting: *Deleted

## 2021-10-25 NOTE — Telephone Encounter (Signed)
Preadmission screen  

## 2021-10-26 ENCOUNTER — Telehealth (HOSPITAL_COMMUNITY): Payer: Self-pay | Admitting: *Deleted

## 2021-10-26 NOTE — Telephone Encounter (Signed)
Preadmission screen  

## 2021-10-29 ENCOUNTER — Encounter (HOSPITAL_COMMUNITY): Payer: Self-pay | Admitting: Obstetrics and Gynecology

## 2021-10-29 ENCOUNTER — Other Ambulatory Visit: Payer: Self-pay

## 2021-10-30 ENCOUNTER — Encounter (HOSPITAL_COMMUNITY): Payer: Self-pay | Admitting: Obstetrics and Gynecology

## 2021-10-30 ENCOUNTER — Inpatient Hospital Stay (HOSPITAL_COMMUNITY)
Admission: AD | Admit: 2021-10-30 | Discharge: 2021-11-01 | DRG: 768 | Disposition: A | Discharge status: Home / Self Care | Payer: BC Managed Care – PPO | Attending: Obstetrics and Gynecology | Admitting: Obstetrics and Gynecology

## 2021-10-30 ENCOUNTER — Inpatient Hospital Stay (HOSPITAL_COMMUNITY): Payer: BC Managed Care – PPO | Admitting: Anesthesiology

## 2021-10-30 ENCOUNTER — Inpatient Hospital Stay (HOSPITAL_COMMUNITY): Payer: BC Managed Care – PPO

## 2021-10-30 DIAGNOSIS — Z3A40 40 weeks gestation of pregnancy: Secondary | ICD-10-CM | POA: Diagnosis not present

## 2021-10-30 DIAGNOSIS — O43123 Velamentous insertion of umbilical cord, third trimester: Secondary | ICD-10-CM | POA: Diagnosis present

## 2021-10-30 DIAGNOSIS — O480 Post-term pregnancy: Principal | ICD-10-CM | POA: Diagnosis present

## 2021-10-30 DIAGNOSIS — D649 Anemia, unspecified: Secondary | ICD-10-CM | POA: Diagnosis not present

## 2021-10-30 DIAGNOSIS — Z20822 Contact with and (suspected) exposure to covid-19: Secondary | ICD-10-CM | POA: Diagnosis not present

## 2021-10-30 DIAGNOSIS — O9902 Anemia complicating childbirth: Secondary | ICD-10-CM | POA: Diagnosis not present

## 2021-10-30 LAB — CBC
HCT: 35.5 % — ABNORMAL LOW (ref 36.0–46.0)
Hemoglobin: 11.6 g/dL — ABNORMAL LOW (ref 12.0–15.0)
MCH: 31.4 pg (ref 26.0–34.0)
MCHC: 32.7 g/dL (ref 30.0–36.0)
MCV: 95.9 fL (ref 80.0–100.0)
Platelets: 203 10*3/uL (ref 150–400)
RBC: 3.7 MIL/uL — ABNORMAL LOW (ref 3.87–5.11)
RDW: 14.1 % (ref 11.5–15.5)
WBC: 10 10*3/uL (ref 4.0–10.5)
nRBC: 0 % (ref 0.0–0.2)

## 2021-10-30 LAB — TYPE AND SCREEN
ABO/RH(D): O POS
Antibody Screen: NEGATIVE

## 2021-10-30 LAB — RESP PANEL BY RT-PCR (FLU A&B, COVID) ARPGX2
Influenza A by PCR: NEGATIVE
Influenza B by PCR: NEGATIVE
SARS Coronavirus 2 by RT PCR: NEGATIVE

## 2021-10-30 LAB — RPR: RPR Ser Ql: NONREACTIVE

## 2021-10-30 MED ORDER — PRENATAL MULTIVITAMIN CH
1.0000 | ORAL_TABLET | Freq: Every day | ORAL | Status: DC
  Administered 2021-10-31: 1 via ORAL
  Filled 2021-10-30 (×2): qty 1

## 2021-10-30 MED ORDER — ONDANSETRON HCL 4 MG PO TABS: 4.0000 mg | ORAL_TABLET | ORAL | Status: DC | PRN

## 2021-10-30 MED ORDER — TETANUS-DIPHTH-ACELL PERTUSSIS 5-2.5-18.5 LF-MCG/0.5 IM SUSY: 0.5000 mL | PREFILLED_SYRINGE | Freq: Once | INTRAMUSCULAR | Status: DC

## 2021-10-30 MED ORDER — TERBUTALINE SULFATE 1 MG/ML IJ SOLN: 0.2500 mg | Freq: Once | INTRAMUSCULAR | Status: DC | PRN

## 2021-10-30 MED ORDER — DIPHENHYDRAMINE HCL 50 MG/ML IJ SOLN: 12.5000 mg | INTRAMUSCULAR | Status: DC | PRN

## 2021-10-30 MED ORDER — PHENYLEPHRINE 40 MCG/ML (10ML) SYRINGE FOR IV PUSH (FOR BLOOD PRESSURE SUPPORT): 80.0000 ug | PREFILLED_SYRINGE | INTRAVENOUS | Status: DC | PRN

## 2021-10-30 MED ORDER — METHYLERGONOVINE MALEATE 0.2 MG/ML IJ SOLN: 0.2000 mg | INTRAMUSCULAR | Status: DC | PRN

## 2021-10-30 MED ORDER — ACETAMINOPHEN 325 MG PO TABS
650.0000 mg | ORAL_TABLET | ORAL | Status: DC | PRN
  Filled 2021-10-30: qty 2

## 2021-10-30 MED ORDER — SIMETHICONE 80 MG PO CHEW: 80.0000 mg | CHEWABLE_TABLET | ORAL | Status: DC | PRN

## 2021-10-30 MED ORDER — ACETAMINOPHEN 325 MG PO TABS: 650.0000 mg | ORAL_TABLET | ORAL | Status: DC | PRN

## 2021-10-30 MED ORDER — FENTANYL CITRATE (PF) 100 MCG/2ML IJ SOLN
100.0000 ug | INTRAMUSCULAR | Status: DC | PRN
  Administered 2021-10-30: 100 ug via INTRAVENOUS
  Filled 2021-10-30: qty 2

## 2021-10-30 MED ORDER — DIBUCAINE (PERIANAL) 1 % EX OINT
1.0000 "application " | TOPICAL_OINTMENT | CUTANEOUS | Status: DC | PRN
  Administered 2021-10-30: 1 via RECTAL
  Filled 2021-10-30: qty 28

## 2021-10-30 MED ORDER — COCONUT OIL OIL: 1.0000 "application " | TOPICAL_OIL | Status: DC | PRN

## 2021-10-30 MED ORDER — FENTANYL-BUPIVACAINE-NACL 0.5-0.125-0.9 MG/250ML-% EP SOLN
12.0000 mL/h | EPIDURAL | Status: DC | PRN
  Administered 2021-10-30: 12 mL/h via EPIDURAL
  Filled 2021-10-30: qty 250

## 2021-10-30 MED ORDER — EPHEDRINE 5 MG/ML INJ: 10.0000 mg | INTRAVENOUS | Status: DC | PRN

## 2021-10-30 MED ORDER — OXYTOCIN-SODIUM CHLORIDE 30-0.9 UT/500ML-% IV SOLN
1.0000 m[IU]/min | INTRAVENOUS | Status: DC
  Administered 2021-10-30: 2 m[IU]/min via INTRAVENOUS

## 2021-10-30 MED ORDER — LACTATED RINGERS IV SOLN
500.0000 mL | Freq: Once | INTRAVENOUS | Status: AC
  Administered 2021-10-30: 500 mL via INTRAVENOUS

## 2021-10-30 MED ORDER — BUPIVACAINE HCL (PF) 0.25 % IJ SOLN
INTRAMUSCULAR | Status: DC | PRN
  Administered 2021-10-30: 8 mL via EPIDURAL

## 2021-10-30 MED ORDER — LACTATED RINGERS IV SOLN: 500.0000 mL | INTRAVENOUS | Status: DC | PRN

## 2021-10-30 MED ORDER — IBUPROFEN 100 MG/5ML PO SUSP
600.0000 mg | Freq: Four times a day (QID) | ORAL | Status: DC
  Administered 2021-10-31 – 2021-11-01 (×6): 600 mg via ORAL
  Filled 2021-10-30 (×6): qty 30

## 2021-10-30 MED ORDER — BENZOCAINE-MENTHOL 20-0.5 % EX AERO
1.0000 "application " | INHALATION_SPRAY | CUTANEOUS | Status: DC | PRN
  Administered 2021-10-31: 1 via TOPICAL
  Filled 2021-10-30: qty 56

## 2021-10-30 MED ORDER — OXYCODONE HCL 5 MG PO TABS: 5.0000 mg | ORAL_TABLET | ORAL | Status: DC | PRN

## 2021-10-30 MED ORDER — OXYCODONE HCL 5 MG PO TABS: 10.0000 mg | ORAL_TABLET | ORAL | Status: DC | PRN

## 2021-10-30 MED ORDER — SOD CITRATE-CITRIC ACID 500-334 MG/5ML PO SOLN: 30.0000 mL | ORAL | Status: DC | PRN

## 2021-10-30 MED ORDER — OXYCODONE-ACETAMINOPHEN 5-325 MG PO TABS: 1.0000 | ORAL_TABLET | ORAL | Status: DC | PRN

## 2021-10-30 MED ORDER — ONDANSETRON HCL 4 MG/2ML IJ SOLN: 4.0000 mg | INTRAMUSCULAR | Status: DC | PRN

## 2021-10-30 MED ORDER — SENNOSIDES-DOCUSATE SODIUM 8.6-50 MG PO TABS
2.0000 | ORAL_TABLET | Freq: Every day | ORAL | Status: DC
  Administered 2021-10-31 – 2021-11-01 (×2): 2 via ORAL
  Filled 2021-10-30 (×2): qty 2

## 2021-10-30 MED ORDER — ZOLPIDEM TARTRATE 5 MG PO TABS: 5.0000 mg | ORAL_TABLET | Freq: Every evening | ORAL | Status: DC | PRN

## 2021-10-30 MED ORDER — DIPHENHYDRAMINE HCL 25 MG PO CAPS: 25.0000 mg | ORAL_CAPSULE | Freq: Four times a day (QID) | ORAL | Status: DC | PRN

## 2021-10-30 MED ORDER — METHYLERGONOVINE MALEATE 0.2 MG PO TABS: 0.2000 mg | ORAL_TABLET | ORAL | Status: DC | PRN

## 2021-10-30 MED ORDER — LACTATED RINGERS IV SOLN: INTRAVENOUS | Status: DC

## 2021-10-30 MED ORDER — LIDOCAINE HCL (PF) 1 % IJ SOLN: 30.0000 mL | INTRAMUSCULAR | Status: DC | PRN

## 2021-10-30 MED ORDER — OXYCODONE-ACETAMINOPHEN 5-325 MG PO TABS: 2.0000 | ORAL_TABLET | ORAL | Status: DC | PRN

## 2021-10-30 MED ORDER — LIDOCAINE HCL (PF) 1 % IJ SOLN
INTRAMUSCULAR | Status: DC | PRN
  Administered 2021-10-30: 8 mL via EPIDURAL

## 2021-10-30 MED ORDER — WITCH HAZEL-GLYCERIN EX PADS
1.0000 "application " | MEDICATED_PAD | CUTANEOUS | Status: DC | PRN
  Administered 2021-10-30: 1 via TOPICAL

## 2021-10-30 MED ORDER — ONDANSETRON HCL 4 MG/2ML IJ SOLN: 4.0000 mg | Freq: Four times a day (QID) | INTRAMUSCULAR | Status: DC | PRN

## 2021-10-30 MED ORDER — OXYTOCIN BOLUS FROM INFUSION
333.0000 mL | Freq: Once | INTRAVENOUS | Status: AC
  Administered 2021-10-30: 333 mL via INTRAVENOUS

## 2021-10-30 MED ORDER — OXYTOCIN-SODIUM CHLORIDE 30-0.9 UT/500ML-% IV SOLN: 2.5000 [IU]/h | INTRAVENOUS | Status: DC | PRN

## 2021-10-30 MED ORDER — OXYTOCIN-SODIUM CHLORIDE 30-0.9 UT/500ML-% IV SOLN
2.5000 [IU]/h | INTRAVENOUS | Status: DC
  Administered 2021-10-30: 2.5 [IU]/h via INTRAVENOUS
  Filled 2021-10-30: qty 500

## 2021-10-30 MED ORDER — MISOPROSTOL 25 MCG QUARTER TABLET
25.0000 ug | ORAL_TABLET | ORAL | Status: DC | PRN
  Administered 2021-10-30 (×2): 25 ug via VAGINAL
  Filled 2021-10-30 (×3): qty 1

## 2021-10-30 MED ORDER — IBUPROFEN 600 MG PO TABS
600.0000 mg | ORAL_TABLET | Freq: Four times a day (QID) | ORAL | Status: DC
  Administered 2021-10-30: 600 mg via ORAL
  Filled 2021-10-30: qty 1

## 2021-10-30 NOTE — H&P (Signed)
Melissa Pineda is a 36 y.o. female presenting for PD IOL  36 yo G1P0 @ 40+2 presents for PD IOL. Her pregnancy has been monitored for a marginal cord insertion. [place] for EFW in 3rd trimester 58%.  OB History     Gravida  1   Para      Term      Preterm      AB      Living         SAB      IAB      Ectopic      Multiple      Live Births             Past Medical History:  Diagnosis Date   Anxiety    Phreesia 11/21/2020   Past Surgical History:  Procedure Laterality Date   WISDOM TOOTH EXTRACTION     Family History: family history includes Cancer in her maternal grandfather, maternal grandmother, mother, and paternal grandfather; Hypertension in her paternal grandmother. Social History:  reports that she has never smoked. She has never used smokeless tobacco. She reports that she does not currently use alcohol after a past usage of about 7.0 standard drinks per week. She reports that she does not use drugs.     Maternal Diabetes: No Genetic Screening: Normal Maternal Ultrasounds/Referrals: Normal Fetal Ultrasounds or other Referrals:  None Maternal Substance Abuse:  No Significant Maternal Medications:  None Significant Maternal Lab Results:  None Other Comments:  None  Review of Systems History Dilation: 2.5 Effacement (%): 30 Station: Ballotable Exam by:: Dr Harrington Challenger Blood pressure 114/74, pulse 85, temperature 97.9 F (36.6 C), temperature source Oral, resp. rate 16, height 5\' 6"  (1.676 m), weight 81.7 kg. Exam Physical Exam  Prenatal labs: ABO, Rh: --/--/O POS (01/10 0020) Antibody: NEG (01/10 0020) Rubella: Immune (06/13 0000) RPR: Nonreactive (06/13 0000)  HBsAg: Negative (06/13 0000)  HIV: Non-reactive (06/13 0000)  GBS: Negative/-- (12/14 0000)   Assessment/Plan: 1) Admit 2) misoprostal 57mcg Q4 hrs PV 3) Epidural on request   Melissa Pineda 10/30/2021, 9:29 AM

## 2021-10-30 NOTE — Anesthesia Preprocedure Evaluation (Signed)
Anesthesia Evaluation  Patient identified by MRN, date of birth, ID band Patient awake    Reviewed: Allergy & Precautions, NPO status , Patient's Chart, lab work & pertinent test results  Airway Mallampati: II  TM Distance: >3 FB Neck ROM: Full    Dental no notable dental hx.    Pulmonary neg pulmonary ROS,    Pulmonary exam normal breath sounds clear to auscultation       Cardiovascular negative cardio ROS Normal cardiovascular exam Rhythm:Regular Rate:Normal     Neuro/Psych PSYCHIATRIC DISORDERS Anxiety negative neurological ROS     GI/Hepatic negative GI ROS, Neg liver ROS,   Endo/Other  negative endocrine ROS  Renal/GU negative Renal ROS  negative genitourinary   Musculoskeletal negative musculoskeletal ROS (+)   Abdominal   Peds negative pediatric ROS (+)  Hematology  (+) anemia ,   Anesthesia Other Findings   Reproductive/Obstetrics (+) Pregnancy                             Anesthesia Physical Anesthesia Plan  ASA: 2  Anesthesia Plan: Epidural   Post-op Pain Management:    Induction:   PONV Risk Score and Plan: 2 and Treatment may vary due to age or medical condition  Airway Management Planned: Natural Airway  Additional Equipment: None  Intra-op Plan:   Post-operative Plan:   Informed Consent: I have reviewed the patients History and Physical, chart, labs and discussed the procedure including the risks, benefits and alternatives for the proposed anesthesia with the patient or authorized representative who has indicated his/her understanding and acceptance.       Plan Discussed with: CRNA and Anesthesiologist  Anesthesia Plan Comments:         Anesthesia Quick Evaluation

## 2021-10-30 NOTE — Lactation Note (Signed)
This note was copied from a baby's chart. Lactation Consultation Note  Patient Name: Melissa Pineda [id number] Date: 10/30/2021 Reason for consult: L&D Initial assessment;Mother's request;Primapara;1st time breastfeeding;Term;Breastfeeding assistance Age:35 hours  Mom plan to EBF. LC assisted with latch with signs of milk transfer.  Infant still feeding at the end of the visit.  Mom to get further [facility] assistance on the floor.   Maternal Data Does the patient have breastfeeding experience prior to this delivery?: No  Feeding Mother's Current Feeding Choice: Breast Milk  LATCH Score Latch: Repeated attempts needed to sustain latch, nipple held in mouth throughout feeding, stimulation needed to elicit sucking reflex.  Audible Swallowing: Spontaneous and intermittent  Type of Nipple: Everted at rest and after stimulation  Comfort (Breast/Nipple): Soft / non-tender  Hold (Positioning): Assistance needed to correctly position infant at breast and maintain latch.  LATCH Score: 8   Lactation Tools Discussed/Used    Interventions Interventions: Breast feeding basics reviewed;Adjust position;Assisted with latch;Support pillows;Skin to skin;Position options;Education;Breast massage;Expressed milk;Infant Driven Feeding Algorithm education;Breast compression  Discharge    Consult Status Consult Status: Follow-up from L&D Date: 10/31/21 Follow-up type: In-patient    Melissa Ruperto  Pineda 10/30/2021, 6:29 PM

## 2021-10-30 NOTE — Anesthesia Procedure Notes (Signed)
Epidural Patient location during procedure: OB Start time: 10/30/2021 12:40 PM End time: 10/30/2021 12:50 PM  Staffing Anesthesiologist: Merlinda Frederick, MD Performed: anesthesiologist   Preanesthetic Checklist Completed: patient identified, IV checked, site marked, risks and benefits discussed, monitors and equipment checked, pre-op evaluation and timeout performed  Epidural Patient position: sitting Prep: DuraPrep Patient monitoring: heart rate, cardiac monitor, continuous pulse ox and blood pressure Approach: midline Location: L2-L3 Injection technique: LOR saline  Needle:  Needle type: Tuohy  Needle gauge: 17 G Needle length: 9 cm Needle insertion depth: 5.5 cm Catheter type: closed end flexible Catheter size: 20 Guage Catheter at skin depth: 11 cm Test dose: negative and Other  Assessment Events: blood not aspirated, injection not painful, no injection resistance and negative IV test  Additional Notes Informed consent obtained prior to proceeding including risk of failure, 1% risk of PDPH, risk of minor discomfort and bruising.  Discussed rare but serious complications including epidural abscess, permanent nerve injury, epidural hematoma.  Discussed alternatives to epidural analgesia and patient desires to proceed.  Timeout performed pre-procedure verifying patient name, procedure, and platelet count.  Patient tolerated procedure well.

## 2021-10-31 LAB — CBC
HCT: 29 % — ABNORMAL LOW (ref 36.0–46.0)
Hemoglobin: 9.4 g/dL — ABNORMAL LOW (ref 12.0–15.0)
MCH: 31.2 pg (ref 26.0–34.0)
MCHC: 32.4 g/dL (ref 30.0–36.0)
MCV: 96.3 fL (ref 80.0–100.0)
Platelets: 180 10*3/uL (ref 150–400)
RBC: 3.01 MIL/uL — ABNORMAL LOW (ref 3.87–5.11)
RDW: 14.2 % (ref 11.5–15.5)
WBC: 11.2 10*3/uL — ABNORMAL HIGH (ref 4.0–10.5)
nRBC: 0 % (ref 0.0–0.2)

## 2021-10-31 MED ORDER — DOCUSATE SODIUM 100 MG PO CAPS
100.0000 mg | ORAL_CAPSULE | Freq: Two times a day (BID) | ORAL | Status: DC
  Administered 2021-10-31 – 2021-11-01 (×3): 100 mg via ORAL
  Filled 2021-10-31 (×3): qty 1

## 2021-10-31 MED ORDER — ACETAMINOPHEN 160 MG/5ML PO SOLN
650.0000 mg | ORAL | Status: DC | PRN
  Administered 2021-10-31 (×2): 650 mg via ORAL
  Filled 2021-10-31 (×2): qty 20.3

## 2021-10-31 MED ORDER — HYDROCORTISONE ACETATE 25 MG RE SUPP
25.0000 mg | Freq: Two times a day (BID) | RECTAL | Status: DC
  Filled 2021-10-31 (×4): qty 1

## 2021-10-31 NOTE — Progress Notes (Signed)
POSTPARTUM PROGRESS NOTE  Post Partum Day #1  Subjective:  No acute events overnight.  Pt denies problems with ambulating, voiding or po intake.  She denies nausea or vomiting.  Pain is well controlled, mainly arises from 3a laceration. Reviewed scheduled stool softeners and PRN oxy, encourage ambulation as well.   Lochia Minimal.   Objective: Blood pressure 107/81, pulse 78, temperature 97.8 F (36.6 C), temperature source Oral, resp. rate 12, height 5\' 6"  (1.676 m), weight 81.7 kg, SpO2 99 %, unknown if currently breastfeeding.  Physical Exam:  General: alert, cooperative and no distress Lochia:normal flow Chest: CTAB Heart: RRR no m/r/g Abdomen: +BS, soft, nontender, Mild epigastric tympany Uterine Fundus: firm, 2cm below umbilicus GU: suture intact, healing well, no purulent drainage. Appropriately TTP for PPD#1 Extremities: neg edema, neg calf TTP BL, neg Homans BL  Recent Labs    10/30/21 0051 10/31/21 0507  HGB 11.6* 9.4*  HCT 35.5* 29.0*    Assessment/Plan:  ASSESSMENT: Melissa Pineda is a 36 y.o. G1P1001 s/p SVD @ [redacted]w[redacted]d. PNC c/b marginal cord insertion. Delivery s/f 3a laceration.   Plan for discharge tomorrow, Breastfeeding, and Circumcision prior to discharge Circ planned for tomorrow 2/2 feeding issues Pericare reviewed for 3a laceration  LOS: 1 day

## 2021-10-31 NOTE — Lactation Note (Signed)
This note was copied from a baby's chart. Lactation Consultation Note Mom stated the baby needs to eat. He didn't feed at his last feeding.  LC checked and changed diaper.  Clear a few small bubbles from mouth w/bulb syring. Placed baby to the breast and baby had a spit up trying to swallow back down. LC cleared mouth w/bulb syring. Patted on back and baby calmed. Did this a couple of times. Explained to mom that's why the baby isn't hungry because he has this fluid in his tummy. Newborn feeding habits and behavior, STS, I&O, discussed. Mom encouraged to feed baby 8-12 times/24 hours and with feeding cues.   Lactation brochure given. Encouraged to call for assistance if needed.  Patient Name: Boy Alainna Stawicki [id number] Date: 10/31/2021 Reason for consult: Initial assessment;Primapara;Term Age:70 hours  Maternal Data Has patient been taught Hand Expression?: Yes Does the patient have breastfeeding experience prior to this delivery?: No  Feeding    LATCH Score Latch: Too sleepy or reluctant, no latch achieved, no sucking elicited.  Audible Swallowing: None  Type of Nipple: Everted at rest and after stimulation  Comfort (Breast/Nipple): Soft / non-tender  Hold (Positioning): Full assist, staff holds infant at breast  LATCH Score: 4   Lactation Tools Discussed/Used    Interventions    Discharge    Consult Status Consult Status: Follow-up Date: 10/31/21 Follow-up type: In-patient    Theodoro Kalata 10/31/2021, 2:57 AM

## 2021-10-31 NOTE — Social Work (Signed)
MOB was referred for history of anxiety.   * Referral screened out by Clinical Social Worker because none of the following criteria appear to apply:  ~ History of anxiety/depression during this pregnancy, or of post-partum depression following prior delivery. No prenatal concerns noted.  ~ Diagnosis of anxiety and/or depression within last 3 years. Per chart review, a diagnosis date of 2018. OR * MOB's symptoms currently being treated with medication and/or therapy.  Please contact the Clinical Social Worker if needs arise, by [facility] request, or if MOB scores greater than 9/yes to question 10 on [place] Postpartum Depression Screen.  Darra Lis, [place] [employer]  [phone]

## 2021-10-31 NOTE — Anesthesia Postprocedure Evaluation (Signed)
Anesthesia Post Note  Patient: Melissa Pineda  Procedure(s) Performed: AN AD HOC LABOR EPIDURAL     Patient location during evaluation: Mother Baby Anesthesia Type: Epidural Level of consciousness: awake and alert, oriented and patient cooperative Pain management: pain level controlled Vital Signs Assessment: post-procedure vital signs reviewed and stable Respiratory status: spontaneous breathing Cardiovascular status: stable Postop Assessment: no headache, epidural receding, patient able to bend at knees and no signs of nausea or vomiting Anesthetic complications: no   No notable events documented.  Last Vitals:  Vitals:   10/31/21 0122 10/31/21 0458  BP: 117/71 107/81  Pulse: 84 78  Resp: 12 12  Temp: 36.8 C 36.6 C  SpO2: 100% 99%    Last Pain:  Vitals:   10/31/21 0458  TempSrc: Oral  PainSc: 4    Pain Goal: Patients Stated Pain Goal: 0 (10/30/21 1450)                 [facility]

## 2021-10-31 NOTE — Lactation Note (Signed)
This note was copied from a baby's chart. Lactation Consultation Note  Patient Name: Melissa Pineda [id number] Date: 10/31/2021 Reason for consult: Follow-up assessment;Nipple pain/trauma;Term;Infant weight loss;Other (Comment);Breastfeeding assistance (4 % weight loss) Age:35 hours LC reviewed the doc flow sheets with mom and updated. Per mom attempted to latch, baby has not been interested since this am .  LC recommended since it had been awhile since the baby fed.  LC recommended to place the baby STS, LC changed a wet diaper / placed baby STS 1st in cross cradle and per mom pinching with latching , LC eased down on the chin and improved slightly. LC showed mom how to release the baby and noted  Bruising on the nipple. Switched to the football position on the same breast / football / and more comfortable with mom and baby fed with 10 mins with swallows.  Baby released on his own. Nipple well rounded . Latch 8  LC plan:    Feed with feeding cues and 8-12 times in 24 hours.  Breast shells while awake between feedings, or 10 mins prior to feedings except when sleeping.  Prior to latching until discomfort improves with latching - 1) breast massage, hand express, prepump with hand pump 10 -20 strokes to prime the milk ducts ( mom aware she may not see colostrum or milk at 1st )  ( This to help baby get into a consistent feeding pattern and counteract discomfort).  Firm support .  Football position worked well.   Maternal Data Has patient been taught Hand Expression?: Yes  Feeding Mother's Current Feeding Choice: Breast Milk  LATCH Score Latch: Grasps breast easily, tongue down, lips flanged, rhythmical sucking.  Audible Swallowing: Spontaneous and intermittent  Type of Nipple: Everted at rest and after stimulation  Comfort (Breast/Nipple): Filling, red/small blisters or bruises, mild/mod discomfort  Hold (Positioning): Assistance needed to correctly position infant at breast and  maintain latch.  LATCH Score: 8   Lactation Tools Discussed/Used Tools: Shells;Pump Breast pump type: Manual Reason for Pumping: pre pumping due to sore nipples - bruising  Interventions Interventions: Breast feeding basics reviewed;Assisted with latch;Skin to skin;Breast massage;Hand express;Breast compression;Adjust position;Support pillows;Position options;Shells;Hand pump;Education  Discharge    Consult Status Consult Status: Follow-up Date: 11/01/21 Follow-up type: In-patient    [place] 10/31/2021, 2:16 PM

## 2021-11-01 MED ORDER — IBUPROFEN 100 MG/5ML PO SUSP: 600.0000 mg | Freq: Four times a day (QID) | ORAL | Status: DC | PRN

## 2021-11-01 MED ORDER — DOCUSATE SODIUM 100 MG PO CAPS: 100.0000 mg | ORAL_CAPSULE | Freq: Two times a day (BID) | ORAL | 0 refills | Status: DC

## 2021-11-01 MED ORDER — ACETAMINOPHEN 160 MG/5ML PO SOLN: 650.0000 mg | ORAL | 0 refills | Status: DC | PRN

## 2021-11-01 NOTE — Progress Notes (Signed)
Post Partum Day 2 Subjective: Melissa Pineda is doing well this morning. Some soreness in her perineum s/p repair but overall pain is controlled. Ambulating, voiding, tolerating PO. Breastfeeding. Minimal lochia.   Objective: Patient Vitals for the past 24 hrs:  BP Temp Temp src Pulse Resp SpO2  11/01/21 0529 102/64 97.7 F (36.5 C) Oral 83 15 100 %  10/31/21 2026 123/83 98.4 F (36.9 C) Oral 70 13 100 %  10/31/21 1519 123/72 97.9 F (36.6 C) Oral 80 17 100 %    Physical Exam:  General: alert, cooperative, and no distress Lochia: appropriate Uterine Fundus: firm Perineum: laceration intact, no evidence of infection or hematoma DVT Evaluation: No evidence of DVT seen on physical exam.  Recent Labs    10/30/21 0051 10/31/21 0507  WBC 10.0 11.2*  HGB 11.6* 9.4*  HCT 35.5* 29.0*  PLT 203 180    No results for input(s): NA, K, CL, CO2CT, BUN, CREATININE, GLUCOSE, BILITOT, ALT, AST, ALKPHOS, PROT, ALBUMIN in the last 72 hours.  No results for input(s): CALCIUM, MG, PHOS in the last 72 hours.  No results for input(s): PROTIME, APTT, INR in the last 72 hours.  No results for input(s): PROTIME, APTT, INR, FIBRINOGEN in the last 72 hours. Assessment/Plan:  Melissa Pineda 36 y.o. G1P1001 PPD#2 sp SVd 1. PPC: continue routine PP care 2. Desires neonatal circumcision, R/B/A of procedure discussed at length. Pt understands that neonatal circumcision is not considered medically necessary and is elective. The risks include, but are not limited to bleeding, infection, damage to the penis, development of scar tissue, and having to have it redone at a later date. Pt understands theses risks and wishes to proceed 3. Rh pos, rubella immune 4. 3A laceration: examined per patient request, intact without evidence of disruption or infection. Continue stool softeners 5. Dispo: discharge home today    LOS: 2 days   Rowland Lathe 11/01/2021, 9:02 AM

## 2021-11-01 NOTE — Lactation Note (Signed)
This note was copied from a baby's chart. Lactation Consultation Note Mom states latching is are hurting so bad dreading to latch d/t pain. Rt. Nipple blistered and raw, Lt. Nipple shaped well bruised. Mom has great everted nipples. Mom just can't take the pain. She states the baby is suckling so hard.  #24 NS applied to Lt. Nipple the least damaged. It was painful every time baby starts and suckles then pain eases.  Positioned baby in football hold mom states its better. Encouraged mom to apply colostrum then coconut oil to nipples. Mom about in tears d/t pain. Baby starting to cluster feed. Suggested mom supplement w/DBM. No transfer in NS noted. Reported to RN to give DBM.  Patient Name: Boy Alaura Schippers [id number] Date: 11/01/2021 Reason for consult: Follow-up assessment;Term;Primapara Age:39 hours  Maternal Data Has patient been taught Hand Expression?: Yes Does the patient have breastfeeding experience prior to this delivery?: No  Feeding Mother's Current Feeding Choice: Breast Milk and Donor Milk  LATCH Score Latch: Grasps breast easily, tongue down, lips flanged, rhythmical sucking.  Audible Swallowing: None  Type of Nipple: Everted at rest and after stimulation  Comfort (Breast/Nipple): Engorged, cracked, bleeding, large blisters, severe discomfort (Rt. nipple blistered and raw/Lt. nipple bruised)  Hold (Positioning): Assistance needed to correctly position infant at breast and maintain latch.  LATCH Score: 5   Lactation Tools Discussed/Used Tools: Shells;Pump;Coconut oil  Interventions    Discharge    Consult Status Consult Status: Follow-up Date: 11/01/21 Follow-up type: In-patient    Viliami Bracco, Elta Gu[place] 11/01/2021, 2:04 AM

## 2021-11-01 NOTE — Discharge Summary (Signed)
Postpartum Discharge Summary  Date of Service updated 11/01/21      Patient Name: Melissa Pineda DOB: 1986/07/16 MRN: [id number]  Date of admission: 10/30/2021 Delivery date:10/30/2021  Delivering provider: [name]  Date of discharge: 11/01/2021  Admitting diagnosis: Normal labor [O80, Z37.9] Spontaneous vaginal delivery [O80] Intrauterine pregnancy: [redacted]w[redacted]d    Secondary diagnosis:  Principal Problem:   Normal labor Active Problems:   Spontaneous vaginal delivery  Additional problems:  Marginal cord insertion    Discharge diagnosis: Term Pregnancy Delivered                                              Post partum procedures: none Augmentation: AROM, Pitocin, and Cytotec Complications: None  Hospital course: Induction of Labor With Vaginal Delivery   36y.o. yo G1P1001 at 490w2das admitted to the hospital 10/30/2021 for induction of labor.  Indication for induction: Postdates.  Patient had an uncomplicated labor course as follows: Membrane Rupture Time/Date: 9:24 AM ,10/30/2021   Delivery Method:Vaginal, Spontaneous  Episiotomy: None  Lacerations:  3rd degree;Perineal  Details of delivery can be found in separate delivery note.  Patient had a routine postpartum course. Patient is discharged home 11/01/21.  Newborn Data: Birth date:10/30/2021  Birth time:5:07 PM  Gender:Female  Living status:Living  Apgars:9 ,9  Weight:4190 g   Magnesium Sulfate received: No BMZ received: No Rhophylac:N/A MMR:N/A T-DaP:Given prenatally Flu: No Transfusion:No  Physical exam  Vitals:   10/31/21 0900 10/31/21 1519 10/31/21 2026 11/01/21 0529  BP: 119/81 123/72 123/83 102/64  Pulse: 97 80 70 83  Resp: '18 17 13 15  ' Temp: 97.6 F (36.4 C) 97.9 F (36.6 C) 98.4 F (36.9 C) 97.7 F (36.5 C)  TempSrc: Oral Oral Oral Oral  SpO2: 99% 100% 100% 100%  Weight:      Height:       General: alert, cooperative, and no distress Lochia: appropriate Uterine Fundus: firm Incision:  N/A DVT Evaluation: No evidence of DVT seen on physical exam. Labs: Lab Results  Component Value Date   WBC 11.2 (H) 10/31/2021   HGB 9.4 (L) 10/31/2021   HCT 29.0 (L) 10/31/2021   MCV 96.3 10/31/2021   PLT 180 10/31/2021   CMP Latest Ref Rng & Units 11/24/2020  Glucose 65 - 99 mg/dL 99  BUN 6 - 20 mg/dL 7  Creatinine 0.57 - 1.00 mg/dL 0.76  Sodium 134 - 144 mmol/L 140  Potassium 3.5 - 5.2 mmol/L 4.1  Chloride 96 - 106 mmol/L 102  CO2 20 - 29 mmol/L 23  Calcium 8.7 - 10.2 mg/dL 9.9  Total Protein 6.0 - 8.5 g/dL 7.7  Total Bilirubin 0.0 - 1.2 mg/dL 1.5(H)  Alkaline Phos 44 - 121 IU/L 56  AST 0 - 40 IU/L 15  ALT 0 - 32 IU/L 11   Edinburgh Score: Edinburgh Postnatal Depression Scale Screening Tool 10/31/2021  I have been able to laugh and see the funny side of things. 0  I have looked forward with enjoyment to things. 0  I have blamed myself unnecessarily when things went wrong. 2  I have been anxious or worried for no good reason. 2  I have felt scared or panicky for no good reason. 2  Things have been getting on top of me. 1  I have been so unhappy that I have had difficulty sleeping. 0  I have felt sad or miserable. 0  I have been so unhappy that I have been crying. 0  The thought of harming myself has occurred to me. 0  Edinburgh Postnatal Depression Scale Total 7      After visit meds:  Allergies as of 11/01/2021   No Known Allergies      Medication List     TAKE these medications    acetaminophen 160 MG/5ML solution Commonly known as: TYLENOL Take 20.3 mLs (650 mg total) by mouth every 4 (four) hours as needed (for pain scale < 4).   docusate sodium 100 MG capsule Commonly known as: Colace Take 1 capsule (100 mg total) by mouth 2 (two) times daily.   ibuprofen 100 MG/5ML suspension Commonly known as: ADVIL Take 30 mLs (600 mg total) by mouth every 6 (six) hours as needed.         Discharge home in stable condition Infant Feeding: Breast Infant  Disposition:home with mother Discharge instruction: per After Visit Summary and Postpartum booklet. Activity: Advance as tolerated. Pelvic rest for 6 weeks.  Diet: routine diet Anticipated Birth Control: Unsure Postpartum Appointment:4 weeks Additional Postpartum F/U:  none Future Appointments:No future appointments. Follow up Visit:  Follow-up Information     [name], MD Follow up in 4 week(s).   Specialty: Obstetrics and Gynecology Contact information: [address] [address] [phone]                     11/01/2021 Rowland Lathe, MD

## 2021-11-01 NOTE — Lactation Note (Signed)
This note was copied from a baby's chart. Lactation Consultation Note  Patient Name: Melissa Pineda [id number] Date: 11/01/2021 Reason for consult: Follow-up assessment;Term;Primapara;1st time breastfeeding;Nipple pain/trauma Age:35 hours   P1 mother whose infant is now 59 hours old.  This is a term baby at 40+2 weeks.  Mother's current feeding preference is breast/donor breast milk.  Mother has been having extreme soreness and nipple sensitivity when she breast feeds.  Upon observation, her nipples are reddened and irritated, however, no breakdown noted.  Mother reports her right nipple is more painful.  Old bruising to the left nipple is healing.  Overall, her nipples appear to be healing.  She is not interested in breast feeding today due to the pain.  Reviewed nipple care routine and provided comfort gels with instructions for use.  Discussed feeding plan for after discharge.  Observed father bottle feeding donor milk and taught paced bottle feeding.  Discussed increasing volumes to 30+mls now and by dinner time tonight "Melissa Pineda" should be consuming 30-60 ms with every feeding.  Encouraged to feed at least every three hours due to 8% weight loss at approximately 35 hours of life.  Mother will begin latching as soon as possible after pain decreases.  Encouraged pumping every three hours.  "Melissa Pineda" has a return pediatric visit tomorrow morning.  Also encouraged an OP LC consult as needed and discussed how to obtain a visit through [facility] if desired.  Mother has a Sabina at her pediatric office so I suggested making a follow up appointment tomorrow for a latch observation.  Mother sounded interested in following through with this plan.  Parents appreciative of help given.  Asked mother to keep a record of all feeds, voids and stools from now until the first return visit.  RN updated.   Maternal Data    Feeding Mother's Current Feeding Choice: Breast Milk and Donor Milk  LATCH Score                     Lactation Tools Discussed/Used    Interventions Interventions: Breast feeding basics reviewed;Education  Discharge Discharge Education: Engorgement and breast care;Outpatient recommendation Pump: Manual;Personal (Spectra) WIC Program: No  Consult Status Consult Status: Complete Date: 11/01/21 Follow-up type: Call as needed    Melissa Pineda 11/01/2021, 10:39 AM

## 2021-11-02 ENCOUNTER — Other Ambulatory Visit: Payer: Self-pay | Admitting: Obstetrics and Gynecology

## 2021-11-02 DIAGNOSIS — N6320 Unspecified lump in the left breast, unspecified quadrant: Secondary | ICD-10-CM

## 2021-11-06 ENCOUNTER — Inpatient Hospital Stay (HOSPITAL_COMMUNITY): Payer: BC Managed Care – PPO

## 2021-11-08 DIAGNOSIS — M542 Cervicalgia: Secondary | ICD-10-CM | POA: Diagnosis not present

## 2021-11-08 DIAGNOSIS — M9901 Segmental and somatic dysfunction of cervical region: Secondary | ICD-10-CM | POA: Diagnosis not present

## 2021-11-08 DIAGNOSIS — M9903 Segmental and somatic dysfunction of lumbar region: Secondary | ICD-10-CM | POA: Diagnosis not present

## 2021-11-08 DIAGNOSIS — M62830 Muscle spasm of back: Secondary | ICD-10-CM | POA: Diagnosis not present

## 2021-11-13 ENCOUNTER — Telehealth (HOSPITAL_COMMUNITY): Payer: Self-pay | Admitting: *Deleted

## 2021-11-13 NOTE — Telephone Encounter (Signed)
Patient voiced no questions or concerns at this time. EPDS=3. Patient voiced no questions or concerns regarding infant at this time. Patient reports infant sleeps in a bassinet on his back. RN reviewed ABCs of safe sleep. Patient verbalized understanding. Patient requested RN email information on hospital's virtual Baby and Me class. Email sent. Erline Levine, RN, 11/13/21, [phone]

## 2021-11-27 DIAGNOSIS — Z124 Encounter for screening for malignant neoplasm of cervix: Secondary | ICD-10-CM | POA: Diagnosis not present

## 2021-11-27 DIAGNOSIS — Z1151 Encounter for screening for human papillomavirus (HPV): Secondary | ICD-10-CM | POA: Diagnosis not present

## 2021-11-27 DIAGNOSIS — Z01411 Encounter for gynecological examination (general) (routine) with abnormal findings: Secondary | ICD-10-CM | POA: Diagnosis not present

## 2021-11-27 DIAGNOSIS — Z01419 Encounter for gynecological examination (general) (routine) without abnormal findings: Secondary | ICD-10-CM | POA: Diagnosis not present

## 2021-11-27 DIAGNOSIS — Z113 Encounter for screening for infections with a predominantly sexual mode of transmission: Secondary | ICD-10-CM | POA: Diagnosis not present

## 2021-12-17 DIAGNOSIS — Z30430 Encounter for insertion of intrauterine contraceptive device: Secondary | ICD-10-CM | POA: Diagnosis not present

## 2021-12-17 DIAGNOSIS — Z3202 Encounter for pregnancy test, result negative: Secondary | ICD-10-CM | POA: Diagnosis not present

## 2022-01-01 DIAGNOSIS — D225 Melanocytic nevi of trunk: Secondary | ICD-10-CM | POA: Diagnosis not present

## 2022-01-01 DIAGNOSIS — D485 Neoplasm of uncertain behavior of skin: Secondary | ICD-10-CM | POA: Diagnosis not present

## 2022-01-01 DIAGNOSIS — D2261 Melanocytic nevi of right upper limb, including shoulder: Secondary | ICD-10-CM | POA: Diagnosis not present

## 2022-01-01 DIAGNOSIS — L814 Other melanin hyperpigmentation: Secondary | ICD-10-CM | POA: Diagnosis not present

## 2022-01-01 DIAGNOSIS — D2262 Melanocytic nevi of left upper limb, including shoulder: Secondary | ICD-10-CM | POA: Diagnosis not present

## 2022-01-18 ENCOUNTER — Ambulatory Visit
Admission: RE | Admit: 2022-01-18 | Discharge: 2022-01-18 | Disposition: A | Discharge status: Home / Self Care | Payer: BC Managed Care – PPO | Source: Ambulatory Visit | Attending: Obstetrics and Gynecology | Admitting: Obstetrics and Gynecology

## 2022-01-18 DIAGNOSIS — N6320 Unspecified lump in the left breast, unspecified quadrant: Secondary | ICD-10-CM

## 2022-01-18 DIAGNOSIS — N6322 Unspecified lump in the left breast, upper inner quadrant: Secondary | ICD-10-CM | POA: Diagnosis not present

## 2022-01-21 DIAGNOSIS — N393 Stress incontinence (female) (male): Secondary | ICD-10-CM | POA: Diagnosis not present

## 2022-01-21 DIAGNOSIS — M6281 Muscle weakness (generalized): Secondary | ICD-10-CM | POA: Diagnosis not present

## 2022-01-21 DIAGNOSIS — M6289 Other specified disorders of muscle: Secondary | ICD-10-CM | POA: Diagnosis not present

## 2022-01-21 DIAGNOSIS — M62838 Other muscle spasm: Secondary | ICD-10-CM | POA: Diagnosis not present

## 2022-01-29 DIAGNOSIS — M6289 Other specified disorders of muscle: Secondary | ICD-10-CM | POA: Diagnosis not present

## 2022-01-29 DIAGNOSIS — M6281 Muscle weakness (generalized): Secondary | ICD-10-CM | POA: Diagnosis not present

## 2022-01-29 DIAGNOSIS — N393 Stress incontinence (female) (male): Secondary | ICD-10-CM | POA: Diagnosis not present

## 2022-01-29 DIAGNOSIS — M62838 Other muscle spasm: Secondary | ICD-10-CM | POA: Diagnosis not present

## 2022-01-31 DIAGNOSIS — Z30431 Encounter for routine checking of intrauterine contraceptive device: Secondary | ICD-10-CM | POA: Diagnosis not present

## 2022-01-31 DIAGNOSIS — M2550 Pain in unspecified joint: Secondary | ICD-10-CM | POA: Diagnosis not present

## 2022-02-19 DIAGNOSIS — L918 Other hypertrophic disorders of the skin: Secondary | ICD-10-CM | POA: Diagnosis not present

## 2022-02-19 DIAGNOSIS — D485 Neoplasm of uncertain behavior of skin: Secondary | ICD-10-CM | POA: Diagnosis not present

## 2022-02-19 DIAGNOSIS — L988 Other specified disorders of the skin and subcutaneous tissue: Secondary | ICD-10-CM | POA: Diagnosis not present

## 2022-09-03 DIAGNOSIS — M9903 Segmental and somatic dysfunction of lumbar region: Secondary | ICD-10-CM | POA: Diagnosis not present

## 2022-09-03 DIAGNOSIS — M542 Cervicalgia: Secondary | ICD-10-CM | POA: Diagnosis not present

## 2022-09-03 DIAGNOSIS — M9901 Segmental and somatic dysfunction of cervical region: Secondary | ICD-10-CM | POA: Diagnosis not present

## 2022-09-03 DIAGNOSIS — M62830 Muscle spasm of back: Secondary | ICD-10-CM | POA: Diagnosis not present

## 2022-09-05 DIAGNOSIS — M9901 Segmental and somatic dysfunction of cervical region: Secondary | ICD-10-CM | POA: Diagnosis not present

## 2022-09-05 DIAGNOSIS — M9903 Segmental and somatic dysfunction of lumbar region: Secondary | ICD-10-CM | POA: Diagnosis not present

## 2022-09-05 DIAGNOSIS — M62830 Muscle spasm of back: Secondary | ICD-10-CM | POA: Diagnosis not present

## 2022-09-05 DIAGNOSIS — M542 Cervicalgia: Secondary | ICD-10-CM | POA: Diagnosis not present

## 2022-09-11 DIAGNOSIS — M62830 Muscle spasm of back: Secondary | ICD-10-CM | POA: Diagnosis not present

## 2022-09-11 DIAGNOSIS — M9903 Segmental and somatic dysfunction of lumbar region: Secondary | ICD-10-CM | POA: Diagnosis not present

## 2022-09-11 DIAGNOSIS — M542 Cervicalgia: Secondary | ICD-10-CM | POA: Diagnosis not present

## 2022-09-11 DIAGNOSIS — M9901 Segmental and somatic dysfunction of cervical region: Secondary | ICD-10-CM | POA: Diagnosis not present

## 2022-10-01 DIAGNOSIS — J069 Acute upper respiratory infection, unspecified: Secondary | ICD-10-CM | POA: Diagnosis not present

## 2022-10-30 ENCOUNTER — Encounter (HOSPITAL_BASED_OUTPATIENT_CLINIC_OR_DEPARTMENT_OTHER): Payer: Self-pay | Admitting: Emergency Medicine

## 2022-10-30 ENCOUNTER — Other Ambulatory Visit (HOSPITAL_BASED_OUTPATIENT_CLINIC_OR_DEPARTMENT_OTHER): Payer: Self-pay

## 2022-10-30 ENCOUNTER — Emergency Department (HOSPITAL_BASED_OUTPATIENT_CLINIC_OR_DEPARTMENT_OTHER): Payer: BC Managed Care – PPO

## 2022-10-30 ENCOUNTER — Other Ambulatory Visit: Payer: Self-pay

## 2022-10-30 ENCOUNTER — Emergency Department (HOSPITAL_BASED_OUTPATIENT_CLINIC_OR_DEPARTMENT_OTHER)
Admission: EM | Admit: 2022-10-30 | Discharge: 2022-10-30 | Disposition: A | Discharge status: Home / Self Care | Payer: BC Managed Care – PPO | Attending: Emergency Medicine | Admitting: Emergency Medicine

## 2022-10-30 DIAGNOSIS — R519 Headache, unspecified: Secondary | ICD-10-CM | POA: Diagnosis not present

## 2022-10-30 DIAGNOSIS — Z20822 Contact with and (suspected) exposure to covid-19: Secondary | ICD-10-CM | POA: Insufficient documentation

## 2022-10-30 DIAGNOSIS — R61 Generalized hyperhidrosis: Secondary | ICD-10-CM | POA: Diagnosis not present

## 2022-10-30 DIAGNOSIS — R059 Cough, unspecified: Secondary | ICD-10-CM | POA: Diagnosis not present

## 2022-10-30 DIAGNOSIS — J189 Pneumonia, unspecified organism: Secondary | ICD-10-CM

## 2022-10-30 DIAGNOSIS — R5383 Other fatigue: Secondary | ICD-10-CM | POA: Diagnosis not present

## 2022-10-30 LAB — CBC WITH DIFFERENTIAL/PLATELET
Abs Immature Granulocytes: 0.06 10*3/uL (ref 0.00–0.07)
Basophils Absolute: 0.1 10*3/uL (ref 0.0–0.1)
Basophils Relative: 0 %
Eosinophils Absolute: 0.9 10*3/uL — ABNORMAL HIGH (ref 0.0–0.5)
Eosinophils Relative: 7 %
HCT: 39.3 % (ref 36.0–46.0)
Hemoglobin: 12.8 g/dL (ref 12.0–15.0)
Immature Granulocytes: 0 %
Lymphocytes Relative: 12 %
Lymphs Abs: 1.6 10*3/uL (ref 0.7–4.0)
MCH: 28.9 pg (ref 26.0–34.0)
MCHC: 32.6 g/dL (ref 30.0–36.0)
MCV: 88.7 fL (ref 80.0–100.0)
Monocytes Absolute: 1.1 10*3/uL — ABNORMAL HIGH (ref 0.1–1.0)
Monocytes Relative: 8 %
Neutro Abs: 9.8 10*3/uL — ABNORMAL HIGH (ref 1.7–7.7)
Neutrophils Relative %: 73 %
Platelets: 357 10*3/uL (ref 150–400)
RBC: 4.43 MIL/uL (ref 3.87–5.11)
RDW: 11.9 % (ref 11.5–15.5)
WBC: 13.4 10*3/uL — ABNORMAL HIGH (ref 4.0–10.5)
nRBC: 0 % (ref 0.0–0.2)

## 2022-10-30 LAB — BASIC METABOLIC PANEL
Anion gap: 11 (ref 5–15)
BUN: 8 mg/dL (ref 6–20)
CO2: 25 mmol/L (ref 22–32)
Calcium: 9.7 mg/dL (ref 8.9–10.3)
Chloride: 101 mmol/L (ref 98–111)
Creatinine, Ser: 0.62 mg/dL (ref 0.44–1.00)
GFR, Estimated: >60 mL/min (ref >60)
Glucose, Bld: 115 mg/dL — ABNORMAL HIGH (ref 70–99)
Potassium: 3.6 mmol/L (ref 3.5–5.1)
Sodium: 137 mmol/L (ref 135–145)

## 2022-10-30 LAB — RESP PANEL BY RT-PCR (RSV, FLU A&B, COVID)  RVPGX2
Influenza A by PCR: NEGATIVE
Influenza B by PCR: NEGATIVE
Resp Syncytial Virus by PCR: NEGATIVE
SARS Coronavirus 2 by RT PCR: NEGATIVE

## 2022-10-30 MED ORDER — SODIUM CHLORIDE 0.9 % IV BOLUS: 1000.0000 mL | Freq: Once | INTRAVENOUS | Status: DC

## 2022-10-30 MED ORDER — DOXYCYCLINE HYCLATE 100 MG PO CAPS
100.0000 mg | ORAL_CAPSULE | Freq: Two times a day (BID) | ORAL | 0 refills | Status: DC
  Filled 2022-10-30: qty 20, 10d supply, fill #0

## 2022-10-30 NOTE — ED Triage Notes (Signed)
Pt via pov from home with cough x 7 weeks; headache, fatigue, some throat pain x 1 week. Pt also endorses night sweats; denies fever. Pt alert & oriented, nad noted.   Pt reports that she has been to UC twice for cough; states she recently finished round of prednisone and cough medication - dry cough continues.

## 2022-10-30 NOTE — ED Notes (Signed)
Pt discharged home after verbalizing understanding of discharge instructions; nad noted. 

## 2022-10-30 NOTE — ED Provider Notes (Signed)
[facility] EMERGENCY DEPT Provider Note   CSN: [id number] Arrival date & time: 10/30/22  1014     History  Chief Complaint  Patient presents with   Cough   Headache    Melissa Pineda is a 37 y.o. female.  Patient here with ongoing cough and viral symptoms.  Symptoms for about a week but may be even longer than that.  All seems to have started after viral process.  She has been on steroids but no antibiotics.  Denies any major medical problems.  Denies any chest pain or shortness of breath.  She has had a cough with some sputum production.  She feels dehydrated, she is having fatigue, some night sweats.  No fever.  The history is provided by the patient.       Home Medications Prior to Admission medications   Medication Sig Start Date End Date Taking? Authorizing Provider  doxycycline (VIBRAMYCIN) 100 MG capsule Take 1 capsule (100 mg total) by mouth 2 (two) times daily. 10/30/22  Yes Alisi Lupien, DO  acetaminophen (TYLENOL) 160 MG/5ML solution Take 20.3 mLs (650 mg total) by mouth every 4 (four) hours as needed (for pain scale < 4). 11/01/21   Rowland Lathe, MD  albuterol (VENTOLIN HFA) 108 (90 Base) MCG/ACT inhaler Inhale into the lungs. 10/17/22   [provider]  docusate sodium (COLACE) 100 MG capsule Take 1 capsule (100 mg total) by mouth 2 (two) times daily. 11/01/21   Rowland Lathe, MD  ibuprofen (ADVIL) 100 MG/5ML suspension Take 30 mLs (600 mg total) by mouth every 6 (six) hours as needed. 11/01/21   Rowland Lathe, MD      Allergies    Patient has no known allergies.    Review of Systems   Review of Systems  Physical Exam Updated Vital Signs BP 130/73   Pulse 89   Temp 97.8 F (36.6 C) (Oral)   Resp 18   Ht 5\' 6"  (1.676 m)   Wt 62.1 kg   LMP 10/14/2022 (Exact Date)   SpO2 99%   BMI 22.11 kg/m  Physical Exam Vitals and nursing note reviewed.  Constitutional:      General: She is not in acute distress.     Appearance: She is well-developed. She is not ill-appearing.  HENT:     Head: Normocephalic and atraumatic.     Mouth/Throat:     Mouth: Mucous membranes are moist.  Eyes:     Extraocular Movements: Extraocular movements intact.     Conjunctiva/sclera: Conjunctivae normal.     Pupils: Pupils are equal, round, and reactive to light.  Cardiovascular:     Rate and Rhythm: Normal rate and regular rhythm.     Heart sounds: Normal heart sounds. No murmur heard. Pulmonary:     Effort: Pulmonary effort is normal. No respiratory distress.     Breath sounds: Normal breath sounds.  Abdominal:     Palpations: Abdomen is soft.     Tenderness: There is no abdominal tenderness.  Musculoskeletal:        General: No swelling.     Cervical back: Normal range of motion and neck supple.  Skin:    General: Skin is warm and dry.     Capillary Refill: Capillary refill takes less than 2 seconds.  Neurological:     Mental Status: She is alert.  Psychiatric:        Mood and Affect: Mood normal.     ED Results / Procedures / Treatments  Labs (all labs ordered are listed, but only abnormal results are displayed) Labs Reviewed  CBC WITH DIFFERENTIAL/PLATELET - Abnormal; Notable for the following components:      Result Value   WBC 13.4 (*)    Neutro Abs 9.8 (*)    Monocytes Absolute 1.1 (*)    Eosinophils Absolute 0.9 (*)    All other components within normal limits  BASIC METABOLIC PANEL - Abnormal; Notable for the following components:   Glucose, Bld 115 (*)    All other components within normal limits  RESP PANEL BY RT-PCR (RSV, FLU A&B, COVID)  [id number]    EKG None  Radiology DG Chest Portable 1 View  Result Date: 10/30/2022 CLINICAL DATA:  Cough, headache, fatigue, night sweats. EXAM: PORTABLE CHEST 1 VIEW COMPARISON:  Overlapping portions CT abdomen 10/09/2011 FINDINGS: Left lower lobe and possible lingular consolidation. Pleural thickening along the left lateral costophrenic angle may  reflect adjacent pleural effusion. The right lung appears clear. Cardiac and mediastinal margins appear normal. IMPRESSION: 1. Left lower lobe and possible lingular consolidation with small left pleural effusion. Findings favor left basilar pneumonia over atelectasis. Followup PA and lateral chest X-ray is recommended in 3-4 weeks following trial of antibiotic therapy to ensure resolution and exclude the unlikely possibility of underlying malignancy. Electronically Signed   By: Van Clines M.D.   On: 10/30/2022 10:54    Procedures Procedures    Medications Ordered in ED Medications - No data to display   ED Course/ Medical Decision Making/ A&P                           Medical Decision Making Amount and/or Complexity of Data Reviewed Labs: ordered. Radiology: ordered.  Risk Prescription drug management.   Tamme Mozingo is here with cough.  Normal vitals.  No fever.  Differential diagnosis pneumonia versus post viral cough.  CBC, BMP, COVID and flu test and chest x-ray obtained.  She has no evidence hypoxia.  She is very well-appearing.  Sounds like she had a viral illness and has not fully recovered.  Chest x-ray per my review and interpretation shows pneumonia.  She has mild leukocytosis but otherwise no significant anemia or other electrolyte abnormality.  Will treat with doxycycline.  Recommend follow-up with primary care doctor.  Discharged in good condition.  This chart was dictated using voice recognition software.  Despite best efforts to proofread,  errors can occur which can change the documentation meaning.         Final Clinical Impression(s) / ED Diagnoses Final diagnoses:  Community acquired pneumonia, unspecified laterality    Rx / DC Orders ED Discharge Orders          Ordered    doxycycline (VIBRAMYCIN) 100 MG capsule  2 times daily        10/30/22 1126              Sreya Froio, [place], DO 10/30/22 1203

## 2022-10-30 NOTE — Discharge Instructions (Signed)
Lab work is normal today.  No signs of dehydration on your labs.  Your COVID and flu test are negative.  Overall suspect you have pneumonia.  Take antibiotic as prescribed.

## 2022-10-30 NOTE — ED Notes (Signed)
Sent light green and lavender tubes as well as urine sample.

## 2022-11-04 ENCOUNTER — Other Ambulatory Visit: Payer: Self-pay

## 2022-11-04 ENCOUNTER — Emergency Department (HOSPITAL_BASED_OUTPATIENT_CLINIC_OR_DEPARTMENT_OTHER): Payer: BC Managed Care – PPO

## 2022-11-04 ENCOUNTER — Encounter (HOSPITAL_COMMUNITY): Payer: Self-pay

## 2022-11-04 ENCOUNTER — Inpatient Hospital Stay (HOSPITAL_BASED_OUTPATIENT_CLINIC_OR_DEPARTMENT_OTHER)
Admission: EM | Admit: 2022-11-04 | Discharge: 2022-11-09 | DRG: 180 | Disposition: A | Discharge status: Home Health | Payer: BC Managed Care – PPO | Attending: Family Medicine | Admitting: Family Medicine

## 2022-11-04 ENCOUNTER — Encounter (HOSPITAL_BASED_OUTPATIENT_CLINIC_OR_DEPARTMENT_OTHER): Payer: Self-pay | Admitting: Emergency Medicine

## 2022-11-04 DIAGNOSIS — J329 Chronic sinusitis, unspecified: Secondary | ICD-10-CM | POA: Diagnosis not present

## 2022-11-04 DIAGNOSIS — J948 Other specified pleural conditions: Secondary | ICD-10-CM | POA: Diagnosis not present

## 2022-11-04 DIAGNOSIS — C3490 Malignant neoplasm of unspecified part of unspecified bronchus or lung: Secondary | ICD-10-CM | POA: Diagnosis not present

## 2022-11-04 DIAGNOSIS — D492 Neoplasm of unspecified behavior of bone, soft tissue, and skin: Secondary | ICD-10-CM | POA: Diagnosis not present

## 2022-11-04 DIAGNOSIS — F418 Other specified anxiety disorders: Secondary | ICD-10-CM | POA: Diagnosis present

## 2022-11-04 DIAGNOSIS — J910 Malignant pleural effusion: Secondary | ICD-10-CM

## 2022-11-04 DIAGNOSIS — E860 Dehydration: Secondary | ICD-10-CM | POA: Diagnosis not present

## 2022-11-04 DIAGNOSIS — J918 Pleural effusion in other conditions classified elsewhere: Secondary | ICD-10-CM | POA: Diagnosis not present

## 2022-11-04 DIAGNOSIS — E871 Hypo-osmolality and hyponatremia: Secondary | ICD-10-CM | POA: Diagnosis present

## 2022-11-04 DIAGNOSIS — J90 Pleural effusion, not elsewhere classified: Principal | ICD-10-CM | POA: Diagnosis present

## 2022-11-04 DIAGNOSIS — Z79899 Other long term (current) drug therapy: Secondary | ICD-10-CM | POA: Diagnosis not present

## 2022-11-04 DIAGNOSIS — J9809 Other diseases of bronchus, not elsewhere classified: Secondary | ICD-10-CM | POA: Diagnosis not present

## 2022-11-04 DIAGNOSIS — G9389 Other specified disorders of brain: Secondary | ICD-10-CM | POA: Diagnosis not present

## 2022-11-04 DIAGNOSIS — C3492 Malignant neoplasm of unspecified part of left bronchus or lung: Secondary | ICD-10-CM | POA: Diagnosis not present

## 2022-11-04 DIAGNOSIS — Z801 Family history of malignant neoplasm of trachea, bronchus and lung: Secondary | ICD-10-CM

## 2022-11-04 DIAGNOSIS — J189 Pneumonia, unspecified organism: Secondary | ICD-10-CM | POA: Diagnosis not present

## 2022-11-04 DIAGNOSIS — K5900 Constipation, unspecified: Secondary | ICD-10-CM | POA: Diagnosis not present

## 2022-11-04 DIAGNOSIS — J9811 Atelectasis: Secondary | ICD-10-CM | POA: Diagnosis present

## 2022-11-04 DIAGNOSIS — J939 Pneumothorax, unspecified: Secondary | ICD-10-CM | POA: Diagnosis not present

## 2022-11-04 DIAGNOSIS — R590 Localized enlarged lymph nodes: Secondary | ICD-10-CM | POA: Diagnosis not present

## 2022-11-04 DIAGNOSIS — D649 Anemia, unspecified: Secondary | ICD-10-CM | POA: Diagnosis not present

## 2022-11-04 DIAGNOSIS — R0602 Shortness of breath: Secondary | ICD-10-CM | POA: Diagnosis not present

## 2022-11-04 DIAGNOSIS — R188 Other ascites: Secondary | ICD-10-CM | POA: Diagnosis not present

## 2022-11-04 DIAGNOSIS — R091 Pleurisy: Secondary | ICD-10-CM | POA: Diagnosis not present

## 2022-11-04 DIAGNOSIS — F419 Anxiety disorder, unspecified: Secondary | ICD-10-CM | POA: Diagnosis not present

## 2022-11-04 DIAGNOSIS — Z8249 Family history of ischemic heart disease and other diseases of the circulatory system: Secondary | ICD-10-CM

## 2022-11-04 DIAGNOSIS — C782 Secondary malignant neoplasm of pleura: Secondary | ICD-10-CM | POA: Diagnosis not present

## 2022-11-04 DIAGNOSIS — R079 Chest pain, unspecified: Secondary | ICD-10-CM | POA: Diagnosis not present

## 2022-11-04 DIAGNOSIS — C801 Malignant (primary) neoplasm, unspecified: Secondary | ICD-10-CM | POA: Diagnosis not present

## 2022-11-04 LAB — CBC WITH DIFFERENTIAL/PLATELET
Abs Immature Granulocytes: 0.08 10*3/uL — ABNORMAL HIGH (ref 0.00–0.07)
Basophils Absolute: 0.1 10*3/uL (ref 0.0–0.1)
Basophils Relative: 0 %
Eosinophils Absolute: 0.7 10*3/uL — ABNORMAL HIGH (ref 0.0–0.5)
Eosinophils Relative: 4 %
HCT: 39.1 % (ref 36.0–46.0)
Hemoglobin: 13.1 g/dL (ref 12.0–15.0)
Immature Granulocytes: 1 %
Lymphocytes Relative: 7 %
Lymphs Abs: 1.2 10*3/uL (ref 0.7–4.0)
MCH: 29.5 pg (ref 26.0–34.0)
MCHC: 33.5 g/dL (ref 30.0–36.0)
MCV: 88.1 fL (ref 80.0–100.0)
Monocytes Absolute: 1.1 10*3/uL — ABNORMAL HIGH (ref 0.1–1.0)
Monocytes Relative: 6 %
Neutro Abs: 13.5 10*3/uL — ABNORMAL HIGH (ref 1.7–7.7)
Neutrophils Relative %: 82 %
Platelets: 347 10*3/uL (ref 150–400)
RBC: 4.44 MIL/uL (ref 3.87–5.11)
RDW: 12 % (ref 11.5–15.5)
WBC: 16.6 10*3/uL — ABNORMAL HIGH (ref 4.0–10.5)
nRBC: 0 % (ref 0.0–0.2)

## 2022-11-04 LAB — COMPREHENSIVE METABOLIC PANEL
ALT: 8 U/L (ref 0–44)
AST: 10 U/L — ABNORMAL LOW (ref 15–41)
Albumin: 3.8 g/dL (ref 3.5–5.0)
Alkaline Phosphatase: 60 U/L (ref 38–126)
Anion gap: 10 (ref 5–15)
BUN: 7 mg/dL (ref 6–20)
CO2: 24 mmol/L (ref 22–32)
Calcium: 9 mg/dL (ref 8.9–10.3)
Chloride: 100 mmol/L (ref 98–111)
Creatinine, Ser: 0.56 mg/dL (ref 0.44–1.00)
GFR, Estimated: >60 mL/min (ref >60)
Glucose, Bld: 145 mg/dL — ABNORMAL HIGH (ref 70–99)
Potassium: 3.7 mmol/L (ref 3.5–5.1)
Sodium: 134 mmol/L — ABNORMAL LOW (ref 135–145)
Total Bilirubin: 1 mg/dL (ref 0.3–1.2)
Total Protein: 7 g/dL (ref 6.5–8.1)

## 2022-11-04 LAB — MAGNESIUM: Magnesium: 1.8 mg/dL (ref 1.7–2.4)

## 2022-11-04 LAB — HCG, SERUM, QUALITATIVE: Preg, Serum: NEGATIVE

## 2022-11-04 LAB — TROPONIN I (HIGH SENSITIVITY): Troponin I (High Sensitivity): 5 ng/L (ref <18)

## 2022-11-04 LAB — BRAIN NATRIURETIC PEPTIDE: B Natriuretic Peptide: 14.3 pg/mL (ref 0.0–100.0)

## 2022-11-04 LAB — STREP PNEUMONIAE URINARY ANTIGEN: Strep Pneumo Urinary Antigen: NEGATIVE

## 2022-11-04 LAB — PHOSPHORUS: Phosphorus: 2.1 mg/dL — ABNORMAL LOW (ref 2.5–4.6)

## 2022-11-04 LAB — LIPASE, BLOOD: Lipase: <10 U/L — ABNORMAL LOW (ref 11–51)

## 2022-11-04 MED ORDER — ALBUTEROL SULFATE HFA 108 (90 BASE) MCG/ACT IN AERS: 1.0000 | INHALATION_SPRAY | Freq: Four times a day (QID) | RESPIRATORY_TRACT | Status: DC | PRN

## 2022-11-04 MED ORDER — SODIUM CHLORIDE 0.9 % IV SOLN
500.0000 mg | INTRAVENOUS | Status: DC
  Administered 2022-11-05: 500 mg via INTRAVENOUS
  Filled 2022-11-04: qty 5

## 2022-11-04 MED ORDER — SODIUM CHLORIDE 0.9 % IV SOLN
500.0000 mg | Freq: Once | INTRAVENOUS | Status: AC
  Administered 2022-11-04: 500 mg via INTRAVENOUS
  Filled 2022-11-04: qty 5

## 2022-11-04 MED ORDER — IOHEXOL 350 MG/ML SOLN
100.0000 mL | Freq: Once | INTRAVENOUS | Status: AC | PRN
  Administered 2022-11-04: 75 mL via INTRAVENOUS

## 2022-11-04 MED ORDER — ALBUTEROL SULFATE (2.5 MG/3ML) 0.083% IN NEBU: 2.5000 mg | INHALATION_SOLUTION | Freq: Four times a day (QID) | RESPIRATORY_TRACT | Status: DC | PRN

## 2022-11-04 MED ORDER — SODIUM CHLORIDE 0.9 % IV SOLN: INTRAVENOUS | Status: DC

## 2022-11-04 MED ORDER — SODIUM CHLORIDE 0.9 % IV SOLN: 2.0000 g | INTRAVENOUS | Status: DC

## 2022-11-04 MED ORDER — SODIUM CHLORIDE 0.9 % IV BOLUS
1000.0000 mL | Freq: Once | INTRAVENOUS | Status: AC
  Administered 2022-11-04: 1000 mL via INTRAVENOUS

## 2022-11-04 MED ORDER — SODIUM CHLORIDE 0.9 % IV SOLN: INTRAVENOUS | Status: DC | PRN

## 2022-11-04 MED ORDER — KETOROLAC TROMETHAMINE 15 MG/ML IJ SOLN
15.0000 mg | Freq: Once | INTRAMUSCULAR | Status: AC
  Administered 2022-11-04: 15 mg via INTRAVENOUS
  Filled 2022-11-04: qty 1

## 2022-11-04 MED ORDER — SODIUM CHLORIDE 0.9 % IV SOLN
1.0000 g | Freq: Once | INTRAVENOUS | Status: AC
  Administered 2022-11-04: 1 g via INTRAVENOUS
  Filled 2022-11-04: qty 10

## 2022-11-04 MED ORDER — ACETAMINOPHEN 500 MG PO TABS
1000.0000 mg | ORAL_TABLET | Freq: Once | ORAL | Status: DC
  Filled 2022-11-04: qty 2

## 2022-11-04 MED ORDER — IBUPROFEN 400 MG PO TABS
600.0000 mg | ORAL_TABLET | Freq: Once | ORAL | Status: AC
  Administered 2022-11-04: 600 mg via ORAL
  Filled 2022-11-04: qty 1

## 2022-11-04 MED ORDER — ACETAMINOPHEN 160 MG/5ML PO SOLN
1000.0000 mg | Freq: Once | ORAL | Status: AC
  Administered 2022-11-04: 1000 mg via ORAL
  Filled 2022-11-04: qty 40.6

## 2022-11-04 NOTE — ED Provider Notes (Signed)
[facility] Provider Note   CSN: [id number] Arrival date & time: 11/04/22  1121     History  Chief Complaint  Patient presents with   Pneumonia   Shortness of Breath    Melissa Pineda is a 37 y.o. female.  37 yo F with a chief complaints of cough chest pain difficulty breathing.  She was actually seen in the emergency department about a week ago and was diagnosed with a left-sided pneumonia.  Was started on antibiotics.  Since then she is gotten progressively worse and over the past couple days has had much more difficulty breathing and suffering from more fatigue.  She denies any fevers at home.  Left-sided pain is worse with breathing twisting turning and palpation.   Pneumonia Associated symptoms include shortness of breath.  Shortness of Breath      Home Medications Prior to Admission medications   Medication Sig Start Date End Date Taking? Authorizing Provider  acetaminophen (TYLENOL) 160 MG/5ML solution Take 20.3 mLs (650 mg total) by mouth every 4 (four) hours as needed (for pain scale < 4). 11/01/21   Rowland Lathe, MD  albuterol (VENTOLIN HFA) 108 (90 Base) MCG/ACT inhaler Inhale into the lungs. 10/17/22   [provider]  docusate sodium (COLACE) 100 MG capsule Take 1 capsule (100 mg total) by mouth 2 (two) times daily. 11/01/21   Rowland Lathe, MD  doxycycline (VIBRAMYCIN) 100 MG capsule Take 1 capsule (100 mg total) by mouth 2 (two) times daily. 10/30/22   Curatolo, Adam, DO  ibuprofen (ADVIL) 100 MG/5ML suspension Take 30 mLs (600 mg total) by mouth every 6 (six) hours as needed. 11/01/21   Rowland Lathe, MD      Allergies    Patient has no known allergies.    Review of Systems   Review of Systems  Respiratory:  Positive for shortness of breath.     Physical Exam Updated Vital Signs BP 122/84   Pulse (!) 105   Temp 99.9 F (37.7 C) (Oral)   Resp (!) 24   LMP 10/14/2022 (Exact Date) Comment: Mirena   SpO2 96%  Physical Exam Vitals and nursing note reviewed.  Constitutional:      General: She is not in acute distress.    Appearance: She is well-developed. She is not diaphoretic.  HENT:     Head: Normocephalic and atraumatic.  Eyes:     Pupils: Pupils are equal, round, and reactive to light.  Cardiovascular:     Rate and Rhythm: Regular rhythm. Tachycardia present.     Heart sounds: No murmur heard.    No friction rub. No gallop.  Pulmonary:     Effort: Pulmonary effort is normal. Tachypnea present.     Breath sounds: Examination of the left-middle field reveals decreased breath sounds. Examination of the left-lower field reveals decreased breath sounds. Decreased breath sounds present. No wheezing or rales.  Abdominal:     General: There is no distension.     Palpations: Abdomen is soft.     Tenderness: There is no abdominal tenderness.  Musculoskeletal:        General: No tenderness.     Cervical back: Normal range of motion and neck supple.  Skin:    General: Skin is warm and dry.  Neurological:     Mental Status: She is alert and oriented to person, place, and time.  Psychiatric:        Behavior: Behavior normal.     ED Results /  Procedures / Treatments   Labs (all labs ordered are listed, but only abnormal results are displayed) Labs Reviewed  CBC WITH DIFFERENTIAL/PLATELET - Abnormal; Notable for the following components:      Result Value   WBC 16.6 (*)    Neutro Abs 13.5 (*)    Monocytes Absolute 1.1 (*)    Eosinophils Absolute 0.7 (*)    Abs Immature Granulocytes 0.08 (*)    All other components within normal limits  COMPREHENSIVE METABOLIC PANEL - Abnormal; Notable for the following components:   Sodium 134 (*)    Glucose, Bld 145 (*)    AST 10 (*)    All other components within normal limits  LIPASE, BLOOD - Abnormal; Notable for the following components:   Lipase <10 (*)    All other components within normal limits  CULTURE, BLOOD (ROUTINE X 2)   CULTURE, BLOOD (ROUTINE X 2)  BRAIN NATRIURETIC PEPTIDE  HCG, SERUM, QUALITATIVE  LEGIONELLA PNEUMOPHILA SEROGP 1 UR AG  STREP PNEUMONIAE URINARY ANTIGEN  TROPONIN I (HIGH SENSITIVITY)  TROPONIN I (HIGH SENSITIVITY)    EKG EKG Interpretation  Date/Time:  Monday November 04 2022 11:45:31 EST Ventricular Rate:  129 PR Interval:  144 QRS Duration: 79 QT Interval:  270 QTC Calculation: 396 R Axis:   58 Text Interpretation: Sinus tachycardia RSR' in V1 or V2, probably normal variant Probable anteroseptal infarct, old No old tracing to compare Confirmed by Melene Plan [phone]) on 11/04/2022 11:57:23 AM  Radiology DG Chest Port 1 View  Result Date: 11/04/2022 CLINICAL DATA:  Provided history: Left chest pain. EXAM: PORTABLE CHEST 1 VIEW COMPARISON:  Prior chest radiograph 10/30/2022. FINDINGS: Significant interval increase in size of a left pleural effusion, now moderate-to-large. Underlying atelectasis and/or airspace consolidation within the mid and lower left lung, also significantly progressed. No appreciable airspace consolidation on the right. Portions of the cardiac silhouette are obscured on the left, limiting evaluation of heart size. No evidence of pneumothorax. No acute bony abnormality identified. IMPRESSION: Significant interval increase in size of a left pleural effusion, now moderate-to-large. Underlying atelectasis and/or airspace consolidation within the mid and lower left lung has also significantly progressed from the prior examination of 10/30/2022. Electronically Signed   By: Jackey Loge D.O.   On: 11/04/2022 12:22    Procedures .Critical Care  Performed by: Melene Plan, DO Authorized by: Melene Plan, DO   Critical care provider statement:    Critical care time (minutes):  35   Critical care time was exclusive of:  Separately billable procedures and treating other patients   Critical care was time spent personally by me on the following activities:  Development of  treatment plan with patient or surrogate, discussions with consultants, evaluation of patient's response to treatment, examination of patient, ordering and review of laboratory studies, ordering and review of radiographic studies, ordering and performing treatments and interventions, pulse oximetry, re-evaluation of patient's condition and review of old charts   Care discussed with: admitting provider       Medications Ordered in ED Medications  cefTRIAXone (ROCEPHIN) 1 g in sodium chloride 0.9 % 100 mL IVPB (1 g Intravenous New Bag/Given 11/04/22 1349)  azithromycin (ZITHROMAX) 500 mg in sodium chloride 0.9 % 250 mL IVPB (has no administration in time range)  acetaminophen (TYLENOL) tablet 1,000 mg (has no administration in time range)  0.9 %  sodium chloride infusion ( Intravenous New Bag/Given 11/04/22 1348)  sodium chloride 0.9 % bolus 1,000 mL (0 mLs Intravenous Stopped 11/04/22 1320)  ketorolac (TORADOL) 15 MG/ML injection 15 mg (15 mg Intravenous Given 11/04/22 1204)    ED Course/ Medical Decision Making/ A&P                             Medical Decision Making Amount and/or Complexity of Data Reviewed Labs: ordered. Radiology: ordered. ECG/medicine tests: ordered.  Risk OTC drugs. Prescription drug management.   37 yo F with a chief complaints of difficulty breathing and cough going on for a few days now.  She had been seen about a week ago and was diagnosed with pneumonia since then things have gotten progressively worse.  She is quite tachypneic on arrival and also has a heart rate into the 140s.  Will obtain a chest x-ray, blood work.     Chest x-ray independently interpreted by me,with a fairly large left-sided pleural effusion I do see air bronchograms which makes me also concern for possible worsening pneumonia.  Will start on community-acquired pneumonia antibiotics.  Blood cultures.  She does have a leukocytosis here as well.  Patient's heart rate has improved a bit with  IV fluids and Toradol and Tylenol.  I discussed case with the hospitalist recommending CT angiogram to further evaluate the patient's chest symptoms.  The patients results and plan were reviewed and discussed.   Any x-rays performed were independently reviewed by myself.   Differential diagnosis were considered with the presenting HPI.  Medications  cefTRIAXone (ROCEPHIN) 1 g in sodium chloride 0.9 % 100 mL IVPB (1 g Intravenous New Bag/Given 11/04/22 1349)  azithromycin (ZITHROMAX) 500 mg in sodium chloride 0.9 % 250 mL IVPB (has no administration in time range)  acetaminophen (TYLENOL) tablet 1,000 mg (has no administration in time range)  0.9 %  sodium chloride infusion ( Intravenous New Bag/Given 11/04/22 1348)  sodium chloride 0.9 % bolus 1,000 mL (0 mLs Intravenous Stopped 11/04/22 1320)  ketorolac (TORADOL) 15 MG/ML injection 15 mg (15 mg Intravenous Given 11/04/22 1204)    Vitals:   11/04/22 1200 11/04/22 1230 11/04/22 1234 11/04/22 1300  BP: 128/78 123/77  122/84  Pulse: (!) 119 (!) 105    Resp: (!) 21 15  (!) 24  Temp:   99.9 F (37.7 C)   TempSrc:   Oral   SpO2: 99% 96%      Final diagnoses:  Pleural effusion    Admission/ observation were discussed with the admitting physician, patient and/or family and they are comfortable with the plan.         Final Clinical Impression(s) / ED Diagnoses Final diagnoses:  Pleural effusion    Rx / DC Orders ED Discharge Orders     None         Melene Plan, DO 11/04/22 1353

## 2022-11-04 NOTE — Progress Notes (Signed)
Plan of Care Note for accepted transfer   Patient: Melissa Pineda MRN: [id number]   DOA: 11/04/2022  Facility requesting transfer: [facility]. Requesting Provider: Melene Plan, DO. Reason for transfer: Dyspnea, pleuritic chest pain and left pleural effusion. Facility course:  Per Dr. Adela Lank: Chief Complaint  Patient presents with   Pneumonia   Shortness of Breath    Melissa Pineda is a 37 y.o. female.   37 yo F with a chief complaints of cough chest pain difficulty breathing.  She was actually seen in the emergency department about a week ago and was diagnosed with a left-sided pneumonia.  Was started on antibiotics.  Since then she is gotten progressively worse and over the past couple days has had much more difficulty breathing and suffering from more fatigue.  She denies any fevers at home.  Left-sided pain is worse with breathing twisting turning and palpation.  Blood culture (routine x 2) [[id number]]   Collected: 11/04/22 1320   Updated: 11/04/22 1344   Specimen Type: Blood   Specimen Source: Peripheral   Brain natriuretic peptide [[id number]]   Collected: 11/04/22 1153   Updated: 11/04/22 1304   Specimen Type: Blood   Specimen Source: Vein    B Natriuretic Peptide 14.3 pg/mL  Troponin I (High Sensitivity) [[id number]]   Collected: 11/04/22 1153   Updated: 11/04/22 1251   Specimen Source: Vein    Troponin I (High Sensitivity) 5 ng/L  Comprehensive metabolic panel [[id number]] (Abnormal)   Collected: 11/04/22 1153   Updated: 11/04/22 1245   Specimen Type: Blood   Specimen Source: Vein    Sodium 134 Low  mmol/L   Potassium 3.7 mmol/L   Chloride 100 mmol/L   CO2 24 mmol/L   Glucose, Bld 145 High  mg/dL   BUN 7 mg/dL   Creatinine, Ser 4.18 mg/dL   Calcium 9.0 mg/dL   Total Protein 7.0 g/dL   Albumin 3.8 g/dL   AST 10 Low  U/L   ALT 8 U/L   Alkaline Phosphatase 60 U/L   Total Bilirubin 1.0 mg/dL   GFR, Estimated >30 mL/min   Anion gap 10  Lipase, blood [[id number]] (Abnormal)    Collected: 11/04/22 1153   Updated: 11/04/22 1245   Specimen Type: Blood   Specimen Source: Vein    Lipase <10 Low  U/L  hCG, serum, qualitative [[id number]]   Collected: 11/04/22 1153   Updated: 11/04/22 1240   Specimen Type: Blood   Specimen Source: Vein    Preg, Serum NEGATIVE  CBC with Differential [[id number]] (Abnormal)   Collected: 11/04/22 1153   Updated: 11/04/22 1221   Specimen Type: Blood   Specimen Source: Vein    WBC 16.6 High  K/uL   RBC 4.44 MIL/uL   Hemoglobin 13.1 g/dL   HCT 85.3 %   MCV 70.8 fL   MCH 29.5 pg   MCHC 33.5 g/dL   RDW 33.2 %   Platelets 347 K/uL   nRBC 0.0 %   Neutrophils Relative % 82 %   Neutro Abs 13.5 High  K/uL   Lymphocytes Relative 7 %   Lymphs Abs 1.2 K/uL   Monocytes Relative 6 %   Monocytes Absolute 1.1 High  K/uL   Eosinophils Relative 4 %   Eosinophils Absolute 0.7 High  K/uL   Basophils Relative 0 %   Basophils Absolute 0.1 K/uL   Immature Granulocytes 1 %   Abs Immature Granulocytes 0.08 High  K/uL   CLINICAL DATA:  Provided history: Left chest pain.  EXAM: PORTABLE CHEST 1 VIEW   COMPARISON:  Prior chest radiograph 10/30/2022.   FINDINGS: Significant interval increase in size of a left pleural effusion, now moderate-to-large. Underlying atelectasis and/or airspace consolidation within the mid and lower left lung, also significantly progressed. No appreciable airspace consolidation on the right. Portions of the cardiac silhouette are obscured on the left, limiting evaluation of heart size. No evidence of pneumothorax. No acute bony abnormality identified.   IMPRESSION: Significant interval increase in size of a left pleural effusion, now moderate-to-large. Underlying atelectasis and/or airspace consolidation within the mid and lower left lung has also significantly progressed from the prior examination of 10/30/2022.   Electronically Signed   By: Jackey Loge D.O.   On: 11/04/2022 12:22  Plan of care: The  patient is accepted for admission to Telemetry unit, at [facility].  Author: Bobette Mo, MD 11/04/2022  Check [email or web address] for on-call coverage.  Nursing staff, Please call [facility] Admits & Consults System-Wide number on Amion as soon as patient's arrival, so appropriate admitting provider can evaluate the pt.

## 2022-11-04 NOTE — H&P (Signed)
History and Physical    Patient: Melissa Pineda DJM:[id number] DOB: 08-Jan-1986 DOA: 11/04/2022 DOS: the patient was seen and examined on 11/04/2022 PCP: Patient, No Pcp Per  Patient coming from: Home  Chief Complaint:  Chief Complaint  Patient presents with   Pneumonia   Shortness of Breath   HPI: Melissa Pineda is a 37 y.o. female with medical history significant of anxiety disorder, who was diagnosed with pneumonia couple of days ago in the ER.  Patient has been on doxycycline at home.  Return to the ER today with worsening shortness of breath cough as well as fever.  Patient was given the doxycycline with albuterol inhaler that she has used.  No's sick contacts.  She is having pleuritic chest pain that was 9 out of 10 on the left side.  In the ER today she was found to have lingular pneumonia with near complete opacification of the area as well as large left-sided pleural effusion.  Patient also has reactive lymphadenopathy.  She is being admitted to the hospital with community-acquired pneumonia that has failed outpatient treatment as well as left-sided pleural effusion.  Review of Systems: As mentioned in the history of present illness. All other systems reviewed and are negative. Past Medical History:  Diagnosis Date   Anxiety    [employer] 11/21/2020   Past Surgical History:  Procedure Laterality Date   WISDOM TOOTH EXTRACTION     Social History:  reports that she has never smoked. She has never used smokeless tobacco. She reports current alcohol use of about 7.0 standard drinks of alcohol per week. She reports current drug use. Drug: Marijuana.  No Known Allergies  Family History  Problem Relation Age of Onset   Cancer Mother    Cancer Maternal Grandmother    Cancer Maternal Grandfather    Hypertension Paternal Grandmother    Cancer Paternal Grandfather     Prior to Admission medications   Medication Sig Start Date End Date Taking? Authorizing Provider  acetaminophen  (TYLENOL) 160 MG/5ML solution Take 20.3 mLs (650 mg total) by mouth every 4 (four) hours as needed (for pain scale < 4). 11/01/21   Charlett Nose, MD  albuterol (VENTOLIN HFA) 108 (90 Base) MCG/ACT inhaler Inhale into the lungs. 10/17/22   [provider]  docusate sodium (COLACE) 100 MG capsule Take 1 capsule (100 mg total) by mouth 2 (two) times daily. 11/01/21   Charlett Nose, MD  doxycycline (VIBRAMYCIN) 100 MG capsule Take 1 capsule (100 mg total) by mouth 2 (two) times daily. 10/30/22   Curatolo, Adam, DO  ibuprofen (ADVIL) 100 MG/5ML suspension Take 30 mLs (600 mg total) by mouth every 6 (six) hours as needed. 11/01/21   Charlett Nose, MD    Physical Exam: Vitals:   11/04/22 1844 11/04/22 1845 11/04/22 2145 11/04/22 2225  BP: 110/75 110/75 114/77 122/77  Pulse: (!) 101 100 (!) 104 (!) 105  Resp: 19 18 (!) 21 20  Temp:   98.6 F (37 C) 97.7 F (36.5 C)  TempSrc:   Oral Oral  SpO2: 97% 96% 96% 98%   Constitutional: Acutely ill looking, in mild distress NAD, calm, comfortable Eyes: PERRL, lids and conjunctivae normal ENMT: Mucous membranes are moist. Posterior pharynx clear of any exudate or lesions.Normal dentition.  Neck: normal, supple, no masses, no thyromegaly Respiratory: Decreased air entry left more than right, some Rales, no wheezing, mild crackles. Normal respiratory effort. No accessory muscle use.  Cardiovascular: Sinus tachycardia, no murmurs / rubs /  gallops. No extremity edema. 2+ pedal pulses. No carotid bruits.  Abdomen: no tenderness, no masses palpated. No hepatosplenomegaly. Bowel sounds positive.  Musculoskeletal: Good range of motion, no joint swelling or tenderness, Skin: no rashes, lesions, ulcers. No induration Neurologic: CN 2-12 grossly intact. Sensation intact, DTR normal. Strength 5/5 in all 4.  Psychiatric: Normal judgment and insight. Alert and oriented x 3. Normal mood  Data Reviewed:  Sodium 134, glucose 145, phosphorus  2.1.  Lipase is less than 10 and AST less than 10.  Troponin of 5.  White count 16.6.  Hemoglobin is normal.  Strep pneumo IgG is negative.  Viral screening done on on the 10th was all negative.  Chest x-ray showed significant interval increase in size of left pleural effusion with new moderate large.  Underlying atelectasis.  CT angiogram of the chest showed no PE but near complete atelectasis of the lingula and left lower lobe with heterogeneous consolidation resulting in marked narrowing of the left-sided airways likely multifocal pneumonia.  Also had large left pleural effusion and multistation mediastinal lymphadenopathy  Assessment and Plan:  #1 left lobular pneumonia: Most likely community-acquired.  Patient seems to be having parapneumonic effusion on the left.  She has failed outpatient treatment with doxycycline.  Admit the patient.  IV Rocephin and Zithromax today.  Blood cultures obtained.  Follow closely.  Continue oxygen and breathing treatments.  #2 large left pleural effusion: Patient will have therapeutic and diagnostic thoracentesis.  Continue antibiotics.  No anticoagulation.  #3 anxiety disorder: Counseling.  Not on any home regimen.  #4 hyponatremia: Most likely mild dehydration.  Hydrate with some saline     Advance Care Planning:   Code Status: Full Code   Consults: None  Family Communication: No family at bedside  Severity of Illness: The appropriate patient status for this patient is INPATIENT. Inpatient status is judged to be reasonable and necessary in order to provide the required intensity of service to ensure the patient's safety. The patient's presenting symptoms, physical exam findings, and initial radiographic and laboratory data in the context of their chronic comorbidities is felt to place them at high risk for further clinical deterioration. Furthermore, it is not anticipated that the patient will be medically stable for discharge from the hospital within 2  midnights of admission.   * I certify that at the point of admission it is my clinical judgment that the patient will require inpatient hospital care spanning beyond 2 midnights from the point of admission due to high intensity of service, high risk for further deterioration and high frequency of surveillance required.*  AuthorLonia Blood, MD 11/04/2022 11:38 PM  For on call review [email or web address].

## 2022-11-04 NOTE — ED Triage Notes (Addendum)
Pt arrives to ED with c/o pneumonia and worsening SOB. Was dx with pneumonia on 1/10, treated with doxycycline. She notes experiencing left flank pain. She notes cough, fever, chills, headache.

## 2022-11-04 NOTE — ED Notes (Signed)
Pt ambulated to bathroom and back without incident. No obvious resp distress/labored breathing at this time.

## 2022-11-05 ENCOUNTER — Inpatient Hospital Stay (HOSPITAL_COMMUNITY): Payer: BC Managed Care – PPO

## 2022-11-05 DIAGNOSIS — J189 Pneumonia, unspecified organism: Secondary | ICD-10-CM

## 2022-11-05 DIAGNOSIS — J90 Pleural effusion, not elsewhere classified: Secondary | ICD-10-CM | POA: Diagnosis not present

## 2022-11-05 DIAGNOSIS — E871 Hypo-osmolality and hyponatremia: Secondary | ICD-10-CM | POA: Diagnosis not present

## 2022-11-05 DIAGNOSIS — D649 Anemia, unspecified: Secondary | ICD-10-CM

## 2022-11-05 DIAGNOSIS — F419 Anxiety disorder, unspecified: Secondary | ICD-10-CM | POA: Diagnosis not present

## 2022-11-05 LAB — PROTEIN, PLEURAL OR PERITONEAL FLUID: Total protein, fluid: 4.6 g/dL

## 2022-11-05 LAB — COMPREHENSIVE METABOLIC PANEL
ALT: 14 U/L (ref 0–44)
AST: 19 U/L (ref 15–41)
Albumin: 3.1 g/dL — ABNORMAL LOW (ref 3.5–5.0)
Alkaline Phosphatase: 56 U/L (ref 38–126)
Anion gap: 10 (ref 5–15)
BUN: 5 mg/dL — ABNORMAL LOW (ref 6–20)
CO2: 23 mmol/L (ref 22–32)
Calcium: 8.1 mg/dL — ABNORMAL LOW (ref 8.9–10.3)
Chloride: 103 mmol/L (ref 98–111)
Creatinine, Ser: 0.49 mg/dL (ref 0.44–1.00)
GFR, Estimated: >60 mL/min (ref >60)
Glucose, Bld: 142 mg/dL — ABNORMAL HIGH (ref 70–99)
Potassium: 3.7 mmol/L (ref 3.5–5.1)
Sodium: 136 mmol/L (ref 135–145)
Total Bilirubin: 0.5 mg/dL (ref 0.3–1.2)
Total Protein: 6.1 g/dL — ABNORMAL LOW (ref 6.5–8.1)

## 2022-11-05 LAB — BODY FLUID CELL COUNT WITH DIFFERENTIAL
Eos, Fluid: 0 %
Lymphs, Fluid: 57 %
Monocyte-Macrophage-Serous Fluid: 18 % — ABNORMAL LOW (ref 50–90)
Neutrophil Count, Fluid: 25 % (ref 0–25)
Total Nucleated Cell Count, Fluid: 1994 cu mm — ABNORMAL HIGH (ref 0–1000)

## 2022-11-05 LAB — RESPIRATORY PANEL BY PCR

## 2022-11-05 LAB — PROTIME-INR
INR: 1.2 (ref 0.8–1.2)
Prothrombin Time: 14.7 seconds (ref 11.4–15.2)

## 2022-11-05 LAB — CBC
HCT: 35.5 % — ABNORMAL LOW (ref 36.0–46.0)
Hemoglobin: 11.3 g/dL — ABNORMAL LOW (ref 12.0–15.0)
MCH: 29 pg (ref 26.0–34.0)
MCHC: 31.8 g/dL (ref 30.0–36.0)
MCV: 91 fL (ref 80.0–100.0)
Platelets: 311 10*3/uL (ref 150–400)
RBC: 3.9 MIL/uL (ref 3.87–5.11)
RDW: 12.1 % (ref 11.5–15.5)
WBC: 15.3 10*3/uL — ABNORMAL HIGH (ref 4.0–10.5)
nRBC: 0 % (ref 0.0–0.2)

## 2022-11-05 LAB — GLUCOSE, PLEURAL OR PERITONEAL FLUID: Glucose, Fluid: 76 mg/dL

## 2022-11-05 LAB — MAGNESIUM: Magnesium: 1.9 mg/dL (ref 1.7–2.4)

## 2022-11-05 LAB — PHOSPHORUS: Phosphorus: 2.5 mg/dL (ref 2.5–4.6)

## 2022-11-05 LAB — LACTATE DEHYDROGENASE, PLEURAL OR PERITONEAL FLUID: LD, Fluid: 583 U/L — ABNORMAL HIGH (ref 3–23)

## 2022-11-05 LAB — HIV ANTIBODY (ROUTINE TESTING W REFLEX): HIV Screen 4th Generation wRfx: NONREACTIVE

## 2022-11-05 LAB — ALBUMIN, PLEURAL OR PERITONEAL FLUID: Albumin, Fluid: 2.5 g/dL

## 2022-11-05 LAB — LACTATE DEHYDROGENASE: LDH: 272 U/L — ABNORMAL HIGH (ref 98–192)

## 2022-11-05 MED ORDER — HYDROCODONE BIT-HOMATROP MBR 5-1.5 MG/5ML PO SOLN
5.0000 mL | Freq: Once | ORAL | Status: AC
  Administered 2022-11-05: 5 mL via ORAL
  Filled 2022-11-05: qty 473

## 2022-11-05 MED ORDER — PIPERACILLIN-TAZOBACTAM 3.375 G IVPB
3.3750 g | Freq: Three times a day (TID) | INTRAVENOUS | Status: DC
  Administered 2022-11-05 – 2022-11-06 (×3): 3.375 g via INTRAVENOUS
  Filled 2022-11-05 (×4): qty 50

## 2022-11-05 MED ORDER — ALPRAZOLAM 0.5 MG PO TABS
1.0000 mg | ORAL_TABLET | Freq: Once | ORAL | Status: AC
  Administered 2022-11-05: 1 mg via ORAL
  Filled 2022-11-05: qty 2

## 2022-11-05 MED ORDER — ALPRAZOLAM 0.5 MG PO TABS: 0.5000 mg | ORAL_TABLET | Freq: Two times a day (BID) | ORAL | Status: DC | PRN

## 2022-11-05 MED ORDER — GUAIFENESIN ER 600 MG PO TB12
1200.0000 mg | ORAL_TABLET | Freq: Two times a day (BID) | ORAL | Status: DC
  Filled 2022-11-05 (×6): qty 2

## 2022-11-05 MED ORDER — BENZONATATE 100 MG PO CAPS
100.0000 mg | ORAL_CAPSULE | Freq: Three times a day (TID) | ORAL | Status: DC | PRN
  Administered 2022-11-05 – 2022-11-06 (×2): 100 mg via ORAL
  Filled 2022-11-05 (×2): qty 1

## 2022-11-05 MED ORDER — ORAL CARE MOUTH RINSE: 15.0000 mL | OROMUCOSAL | Status: DC | PRN

## 2022-11-05 MED ORDER — ACETAMINOPHEN 325 MG PO TABS
650.0000 mg | ORAL_TABLET | Freq: Four times a day (QID) | ORAL | Status: DC | PRN
  Administered 2022-11-05 – 2022-11-09 (×2): 650 mg via ORAL
  Filled 2022-11-05 (×2): qty 2

## 2022-11-05 MED ORDER — IBUPROFEN 100 MG/5ML PO SUSP
400.0000 mg | Freq: Once | ORAL | Status: AC
  Administered 2022-11-05: 400 mg via ORAL
  Filled 2022-11-05: qty 20

## 2022-11-05 NOTE — Progress Notes (Addendum)
Dewald MD at bedside for Thoracentesis. Consent completed prior to procedure. Vitals stable throughout procedure.   12:25 PM: HR 125, SpO2 99 (room air), BP 103/75 12:53 PM: HR 110, BP 108/76  SpO2 100 (room air)   Patient denies shortness of breath or chest pain on completion. Educated to notify bedside RN if shortness of breath worsens or has increased chest pain.

## 2022-11-05 NOTE — Procedures (Signed)
Thoracentesis  Procedure Note  Melissa Pineda  [id number]  12-31-1985  Date:11/05/22  Time:12:53 PM   Provider Performing:Taegen Delker B Terrie Grajales   Procedure: Thoracentesis with imaging guidance (14950)  Indication(s) Pleural Effusion  Consent Risks of the procedure as well as the alternatives and risks of each were explained to the patient and/or caregiver.  Consent for the procedure was obtained and is signed in the bedside chart  Anesthesia Topical only with 1% lidocaine    Time Out Verified patient identification, verified procedure, site/side was marked, verified correct patient position, special equipment/implants available, medications/allergies/relevant history reviewed, required imaging and test results available.   Sterile Technique Maximal sterile technique including full sterile barrier drape, hand hygiene, sterile gown, sterile gloves, mask, hair covering, sterile ultrasound probe cover (if used).  Procedure Description Ultrasound was used to identify appropriate pleural anatomy for placement and overlying skin marked.  Area of drainage cleaned and draped in sterile fashion. Lidocaine was used to anesthetize the skin and subcutaneous tissue.  1900 cc's of blood tinged appearing fluid was drained from the left pleural space. Catheter then removed and bandaid applied to site.   Complications/Tolerance None; patient tolerated the procedure well. Chest X-ray is ordered to confirm no post-procedural complication.   EBL Minimal   Specimen(s) Pleural fluid

## 2022-11-05 NOTE — Consult Note (Signed)
NAME:  Melissa Pineda, MRN:  [id number], DOB:  11-Nov-1985, LOS: 1 ADMISSION DATE:  11/04/2022, CONSULTATION DATE:  11/05/22 REFERRING MD:  Marland Mcalpine CHIEF COMPLAINT:  pneumonia and pleural effusion   History of Present Illness:  Melissa Pineda is a 37 y.o. F with PMH significant for anxiety who presented to the ED 1/15 with worsening shortness of breath and left flank pain.  She had been seen previously in the ED on 1/10 with cough for one week and was diagnosed with pneumonia, covid and flu were negative.  She was treated with doxycycline and albuterol and discharged home,  however symptoms worsened so she returned.    CXR showed L lingular pneumonia with near complete opacification and CT chest with large left pleural effusion.   Labs showed a WBC of 16k and strep pneumo was negative.   She was admitted to the hospitalists and treated with Rocephin and Azithromycin and has not required [place] oxygen.  PCCM consulted for pleural effusion.   She reports that her symptoms initially began approximately two months ago with a mild cold, however her dry cough persisted.  She initially went to urgent care and was given sudafed, then flew to [place] for Christmas and was seen again for cough.  States that her CXR was reportedly clear, so was treated with steroids.  She has had night sweats and chills, no measured fevers or weight loss.   Does not smoke or vape, no recent travel, occasionally smokes marijuana, but no other drug use, no regular alcohol use.   Pertinent  Medical History   has a past medical history of Anxiety.   Significant Hospital Events: Including procedures, antibiotic start and stop dates in addition to other pertinent events   1/15 presented to the ED with worsening L-sided pneumonia that failed outpatient treatment  1/16 PCCM consult, thoracentesis  Interim History / Subjective:  As above  Objective   Blood pressure 124/81, pulse 96, temperature 98.2 F (36.8 C), temperature source Oral, resp.  rate 18, last menstrual period 10/14/2022, SpO2 100 %, unknown if currently breastfeeding.        Intake/Output Summary (Last 24 hours) at 11/05/2022 1048 Last data filed at 11/05/2022 0900 Gross per 24 hour  Intake 1859.5 ml  Output --  Net 1859.5 ml   There were no vitals filed for this visit.  General:  well-nourished F, sitting up in chair in no acute distress HEENT: MM pink/moist, sclera anicteric Neuro: awake and alert, oriented x3 CV: s1s2 rrr, no m/r/g PULM:  Decreased air movement L upper and lower lung fields, slightly diminished on the R, no rhonchi or wheezing GI: soft, bsx4 active  Extremities: warm/dry, no edema  Skin: no rashes or lesions   Resolved Hospital Problem list     Assessment & Plan:    LLL pneumonia and para-pneumonic effusion  Failed outpatient treatment  Covid and flu negative  RVP pending -thoracentesis done today with 1.5 L off, fluid studies sent -change antibiotics to Zosyn -continue IS and nebs  -follow blood cultures -thank you for this consult PCCM will continue to follow with you     Best Practice (right click and "Reselect all SmartList Selections" daily)   Per primary  Labs   CBC: Recent Labs  Lab 10/30/22 1033 11/04/22 1153 11/05/22 0419  WBC 13.4* 16.6* 15.3*  NEUTROABS 9.8* 13.5*  --   HGB 12.8 13.1 11.3*  HCT 39.3 39.1 35.5*  MCV 88.7 88.1 91.0  PLT 357 347 311  Basic Metabolic Panel: Recent Labs  Lab 10/30/22 1033 11/04/22 1153 11/05/22 0419  NA 137 134* 136  K 3.6 3.7 3.7  CL 101 100 103  CO2 25 24 23   GLUCOSE 115* 145* 142*  BUN 8 7 5*  CREATININE 0.62 0.56 0.49  CALCIUM 9.7 9.0 8.1*  MG  --  1.8  --   PHOS  --  2.1*  --    GFR: Estimated Creatinine Clearance: 91.9 mL/min (by C-G formula based on SCr of 0.49 mg/dL). Recent Labs  Lab 10/30/22 1033 11/04/22 1153 11/05/22 0419  WBC 13.4* 16.6* 15.3*    Liver Function Tests: Recent Labs  Lab 11/04/22 1153 11/05/22 0419  AST 10* 19   ALT 8 14  ALKPHOS 60 56  BILITOT 1.0 0.5  PROT 7.0 6.1*  ALBUMIN 3.8 3.1*   Recent Labs  Lab 11/04/22 1153  LIPASE <10*   No results for input(s): "AMMONIA" in the last 168 hours.  ABG No results found for: "PHART", "PCO2ART", "PO2ART", "HCO3", "TCO2", "ACIDBASEDEF", "O2SAT"   Coagulation Profile: No results for input(s): "INR", "PROTIME" in the last 168 hours.  Cardiac Enzymes: No results for input(s): "CKTOTAL", "CKMB", "CKMBINDEX", "TROPONINI" in the last 168 hours.  HbA1C: Hgb A1c MFr Bld  Date/Time Value Ref Range Status  11/24/2020 11:06 AM 5.0 4.8 - 5.6 % Final    Comment:             Prediabetes: 5.7 - 6.4          Diabetes: >6.4          Glycemic control for adults with diabetes: <7.0     CBG: No results for input(s): "GLUCAP" in the last 168 hours.  Review of Systems:   Please see the history of present illness. All other systems reviewed and are negative    Past Medical History:  She,  has a past medical history of Anxiety.   Surgical History:   Past Surgical History:  Procedure Laterality Date   WISDOM TOOTH EXTRACTION       Social History:   reports that she has never smoked. She has never used smokeless tobacco. She reports current alcohol use of about 7.0 standard drinks of alcohol per week. She reports current drug use. Drug: Marijuana.   Family History:  Her family history includes Cancer in her maternal grandfather, maternal grandmother, mother, and paternal grandfather; Hypertension in her paternal grandmother.   Allergies No Known Allergies   Home Medications  Prior to Admission medications   Medication Sig Start Date End Date Taking? Authorizing Provider  Ascorbic Acid (VITAMIN C PO) Take 2 tablets by mouth daily.   Yes [provider]  doxycycline (VIBRAMYCIN) 100 MG capsule Take 1 capsule (100 mg total) by mouth 2 (two) times daily. 10/30/22  Yes Curatolo, Adam, DO  ibuprofen (ADVIL) 200 MG tablet Take 400-600 mg by  mouth 2 (two) times daily as needed for headache or mild pain.   Yes [provider]  levonorgestrel (MIRENA, 52 MG,) 20 MCG/DAY IUD 1 each by Intrauterine route once. 12/18/21  Yes [provider]  albuterol (VENTOLIN HFA) 108 (90 Base) MCG/ACT inhaler Inhale into the lungs. Patient not taking: Reported on 11/05/2022 10/17/22   [provider]  docusate sodium (COLACE) 100 MG capsule Take 1 capsule (100 mg total) by mouth 2 (two) times daily. Patient not taking: Reported on 11/05/2022 11/01/21   12/30/21, MD     Critical care time:  Otilio Carpen Ryliee Figge, PA-C [facility] See Amion for pager If no response to pager , please call [phone] until 7pm After 7:00 pm call Elink  [phone]

## 2022-11-05 NOTE — Progress Notes (Signed)
PROGRESS NOTE    Melissa Pineda  VQQ:[id number] DOB: 1986-07-11 DOA: 11/04/2022 PCP: Patient, No Pcp Per   Brief Narrative:  The patient is a 37 year old Caucasian female with a past medical history significant for but not limited to and anxiety who was diagnosed with a pneumonia few days ago in the ER and was discharged home on doxycycline.  She returned to the ED today with worsening shortness of breath, cough, as well as fever.  She was initially given doxycycline with albuterol inhaler and has had no sick contacts but has a 15-year-old child at home.  She started having pleuritic chest pain that was 9 out of 10 in severity and left-sided in the ED she was found to have a lingular pneumonia with near complete opacification of the area with a large left-sided pleural effusion.  She is also noted to have reactive lymphadenopathy and she is admitted to the hospital with community-acquired pneumonia and failed outpatient treatment as well as a left-sided pleural effusion.  Currently she is not hypoxic.  Chest x-ray done today showed worsening of her pleural effusion so pulmonary was consulted and they did a bedside thoracentesis which drained 1.9 L of blood-tinged cloudy fluid.   Assessment and Plan: No notes have been filed under this hospital service. Service: Hospitalist  Left Lower Lobe PNA with Pleural and Parapneumonic Effusion -Admitted to inpatient telemetry -CTA Scan Chest PE Protocol done and showed "No acute pulmonary embolism.  Near-complete atelectasis of the lingula and left lower lobe with heterogeneous consolidation resulting in marked narrowing of the left sided airways, likely multifocal pneumonia. Large left pleural effusion. Multi station mediastinal lymphadenopathy likely reactive" -CXR done and showed "Increasing large LEFT effusion occupying 75% of the LEFT hemithorax." -C/w Albuterol 2.5 mg Neb q6hprn Wheezing -Added Guaifenesin 1200 mg po BID, Flutter Valve, Incentive  Spirometry -C/w Abx with IV Zosyn and IV Azithromycin -IF necessary will place on Xopenex/Atrovent -Pulmonary consulted and they did a Bedside Thoracentesis which yieled 1900 mL of blood tinged and cloudy fluid -Follow up on Pleural Fluid Analysis  -Repeat CXR in the AM and pulmonary recommending repeat bedside ultrasound to determine if repeat thoracentesis versus if a chest tube is warranted -Checked respiratory virus panel and this was negative as well as flu, COVID and RSV were negative -Blood cultures x 2 pending and will continue to follow  Anxiety Disorder -Not on any Meds -Had to be given 1 mg of Alprazolam po x1 -May initiate Hydroxyzine     Hyponatremia -Na+ Trend: Recent Labs  Lab 10/30/22 1033 11/04/22 1153 11/05/22 0419  NA 137 134* 136  -Continue to Monitor and Trend and repeat CMP in the AM   Leukocytosis -WBC Trend: Recent Labs  Lab 10/30/22 1033 11/04/22 1153 11/05/22 0419  WBC 13.4* 16.6* 15.3*  -In the setting of Pneumonia and Pleural Effusion -Continue to Monitor and Trend and repeat CBC in the AM   Normocytic Anemia -Hgb/Hct Trend: Recent Labs  Lab 10/30/22 1033 11/04/22 1153 11/05/22 0419  HGB 12.8 13.1 11.3*  HCT 39.3 39.1 35.5*  MCV 88.7 88.1 91.0  -Check Anemia Panel in the AM  -Continue to Monitor for S/Sx of Bleeding; No overt bleeding noted -Repeat CBC in the AM   DVT prophylaxis: SCDs Start: 11/04/22 2336    Code Status: Full Code Family Communication: No family currently at bedside but discussed the case with her aunt over the telephone  Disposition Plan:  Level of care: Telemetry Status is: Inpatient Remains inpatient appropriate because:  His further clinical improvement and evaluation by pulmonary   Consultants:  Pulmonary  Procedures:  As delineated above and she had a thoracentesis  Antimicrobials:  Anti-infectives (From admission, onward)    Start     Dose/Rate Route Frequency Ordered Stop   11/05/22 1400   azithromycin (ZITHROMAX) 500 mg in sodium chloride 0.9 % 250 mL IVPB        500 mg 250 mL/hr over 60 Minutes Intravenous Every 24 hours 11/04/22 2338 11/10/22 1359   11/05/22 1300  cefTRIAXone (ROCEPHIN) 2 g in sodium chloride 0.9 % 100 mL IVPB  Status:  Discontinued        2 g 200 mL/hr over 30 Minutes Intravenous Every 24 hours 11/04/22 2338 11/05/22 1144   11/05/22 1245  piperacillin-tazobactam (ZOSYN) IVPB 3.375 g        3.375 g 12.5 mL/hr over 240 Minutes Intravenous Every 8 hours 11/05/22 1147     11/04/22 1215  cefTRIAXone (ROCEPHIN) 1 g in sodium chloride 0.9 % 100 mL IVPB        1 g 200 mL/hr over 30 Minutes Intravenous  Once 11/04/22 1213 11/04/22 1420   11/04/22 1215  azithromycin (ZITHROMAX) 500 mg in sodium chloride 0.9 % 250 mL IVPB        500 mg 250 mL/hr over 60 Minutes Intravenous  Once 11/04/22 1213 11/04/22 1521       Subjective: Seen and examined at bedside and she was anxious and had some worsening dyspnea.  States that she is having some discomfort in her chest and abdomen from likely the pleural effusion.  She denies any nausea or vomiting.  States that she did not feel well and felt worse and was concerned about the worsening fluid in her lungs.  No other concerns or complaints at this time.  Objective: Vitals:   11/05/22 0354 11/05/22 0815 11/05/22 0926 11/05/22 1114  BP: 122/79 123/74 124/81 122/72  Pulse: (!) 103 96  (!) 123  Resp:  18  18  Temp: 98.4 F (36.9 C) 98.2 F (36.8 C)  98.3 F (36.8 C)  TempSrc: Oral Oral  Oral  SpO2: 97% 100% 100% 98%    Intake/Output Summary (Last 24 hours) at 11/05/2022 1252 Last data filed at 11/05/2022 0900 Gross per 24 hour  Intake 1859.5 ml  Output --  Net 1859.5 ml   There were no vitals filed for this visit.  Examination: Physical Exam:  Constitutional: WN/WD Caucasian female who is anxious Respiratory: Diminished to auscultation bilaterally with coarse breath sounds worse on the left compared to right and  she does have some crackles and rhonchi noted.  No appreciable wheezing or rales. Normal respiratory effort and patient is not tachypenic. No accessory muscle use.  Not wearing any supplemental oxygen via nasal cannula Cardiovascular: RRR, no murmurs / rubs / gallops. S1 and S2 auscultated. No extremity edema.  Abdomen: Soft, non-tender, non-distended.  Bowel sounds positive.  GU: Deferred. Musculoskeletal: No clubbing / cyanosis of digits/nails. No joint deformity upper and lower extremities.  Skin: No rashes, lesions, ulcers limited skin evaluation. No induration; Warm and dry.  Neurologic: CN 2-12 grossly intact with no focal deficits. Romberg sign cerebellar reflexes not assessed.  Psychiatric: Normal judgment and insight. Alert and oriented x 3.  Anxious mood and appropriate affect.   Data Reviewed: I have personally reviewed following labs and imaging studies  CBC: Recent Labs  Lab 10/30/22 1033 11/04/22 1153 11/05/22 0419  WBC 13.4* 16.6* 15.3*  NEUTROABS 9.8* 13.5*  --  HGB 12.8 13.1 11.3*  HCT 39.3 39.1 35.5*  MCV 88.7 88.1 91.0  PLT 357 347 311   Basic Metabolic Panel: Recent Labs  Lab 10/30/22 1033 11/04/22 1153 11/05/22 0419 11/05/22 0425  NA 137 134* 136  --   K 3.6 3.7 3.7  --   CL 101 100 103  --   CO2 25 24 23   --   GLUCOSE 115* 145* 142*  --   BUN 8 7 5*  --   CREATININE 0.62 0.56 0.49  --   CALCIUM 9.7 9.0 8.1*  --   MG  --  1.8  --  1.9  PHOS  --  2.1*  --  2.5   GFR: Estimated Creatinine Clearance: 91.9 mL/min (by C-G formula based on SCr of 0.49 mg/dL). Liver Function Tests: Recent Labs  Lab 11/04/22 1153 11/05/22 0419  AST 10* 19  ALT 8 14  ALKPHOS 60 56  BILITOT 1.0 0.5  PROT 7.0 6.1*  ALBUMIN 3.8 3.1*   Recent Labs  Lab 11/04/22 1153  LIPASE <10*   No results for input(s): "AMMONIA" in the last 168 hours. Coagulation Profile: Recent Labs  Lab 11/05/22 1110  INR 1.2   Cardiac Enzymes: No results for input(s): "CKTOTAL",  "CKMB", "CKMBINDEX", "TROPONINI" in the last 168 hours. BNP (last 3 results) No results for input(s): "PROBNP" in the last 8760 hours. HbA1C: No results for input(s): "HGBA1C" in the last 72 hours. CBG: No results for input(s): "GLUCAP" in the last 168 hours. Lipid Profile: No results for input(s): "CHOL", "HDL", "LDLCALC", "TRIG", "CHOLHDL", "LDLDIRECT" in the last 72 hours. Thyroid Function Tests: No results for input(s): "TSH", "T4TOTAL", "FREET4", "T3FREE", "THYROIDAB" in the last 72 hours. Anemia Panel: No results for input(s): "VITAMINB12", "FOLATE", "FERRITIN", "TIBC", "IRON", "RETICCTPCT" in the last 72 hours. Sepsis Labs: No results for input(s): "PROCALCITON", "LATICACIDVEN" in the last 168 hours.  Recent Results (from the past 240 hour(s))  Resp panel by RT-PCR (RSV, Flu A&B, Covid) Nasopharyngeal Swab     Status: None   Collection Time: 10/30/22 11:15 AM   Specimen: Nasopharyngeal Swab; Nasal Swab  Result Value Ref Range Status   SARS Coronavirus 2 by RT PCR NEGATIVE NEGATIVE Final    Comment: (NOTE) SARS-CoV-2 target nucleic acids are NOT DETECTED.  The SARS-CoV-2 RNA is generally detectable in upper respiratory specimens during the acute phase of infection. The lowest concentration of SARS-CoV-2 viral copies this assay can detect is 138 copies/mL. A negative result does not preclude SARS-Cov-2 infection and should not be used as the sole basis for treatment or other patient management decisions. A negative result may occur with  improper specimen collection/handling, submission of specimen other than nasopharyngeal swab, presence of viral mutation(s) within the areas targeted by this assay, and inadequate number of viral copies(<138 copies/mL). A negative result must be combined with clinical observations, patient history, and epidemiological information. The expected result is Negative.  Fact Sheet for Patients:  12/29/22  Fact  Sheet for Healthcare Providers:  BloggerCourse.com  This test is no t yet approved or cleared by the [place] FDA and  has been authorized for detection and/or diagnosis of SARS-CoV-2 by FDA under an Emergency Use Authorization (EUA). This EUA will remain  in effect (meaning this test can be used) for the duration of the COVID-19 declaration under Section 564(b)(1) of the Act, 21 U.S.C.section 360bbb-3(b)(1), unless the authorization is terminated  or revoked sooner.       Influenza A by PCR NEGATIVE  NEGATIVE Final   Influenza B by PCR NEGATIVE NEGATIVE Final    Comment: (NOTE) The Xpert Xpress SARS-CoV-2/FLU/RSV plus assay is intended as an aid in the diagnosis of influenza from Nasopharyngeal swab specimens and should not be used as a sole basis for treatment. Nasal washings and aspirates are unacceptable for Xpert Xpress SARS-CoV-2/FLU/RSV testing.  Fact Sheet for Patients: BloggerCourse.com  Fact Sheet for Healthcare Providers: [employer]ious[employer]ker.it  This test is not yet approved or cleared by the [place] FDA and has been authorized for detection and/or diagnosis of SARS-CoV-2 by FDA under an Emergency Use Authorization (EUA). This EUA will remain in effect (meaning this test can be used) for the duration of the COVID-19 declaration under Section 564(b)(1) of the Act, 21 U.S.C. section 360bbb-3(b)(1), unless the authorization is terminated or revoked.     Resp Syncytial Virus by PCR NEGATIVE NEGATIVE Final    Comment: (NOTE) Fact Sheet for Patients: BloggerCourse.com  Fact Sheet for Healthcare Providers: [employer].it  This test is not yet approved or cleared by the [place] FDA and has been authorized for detection and/or diagnosis of SARS-CoV-2 by FDA under an Emergency Use Authorization (EUA). This EUA will remain in effect  (meaning this test can be used) for the duration of the COVID-19 declaration under Section 564(b)(1) of the Act, 21 U.S.C. section 360bbb-3(b)(1), unless the authorization is terminated or revoked.  Performed at [facility], [address], [place], [place] [address]   Blood culture (routine x 2)     Status: None (Preliminary result)   Collection Time: 11/04/22  1:20 PM   Specimen: BLOOD RIGHT HAND  Result Value Ref Range Status   Specimen Description   Final    BLOOD RIGHT HAND Performed at [facility], [address], [place], [place] [address]    Special Requests   Final    BOTTLES DRAWN AEROBIC AND ANAEROBIC Blood Culture adequate volume Performed at [facility], [address] [place], [place] [address]    Culture   Final    NO GROWTH < 24 HOURS Performed at [facility], [address] [place], [place] [address]    Report Status PENDING  Incomplete  Blood culture (routine x 2)     Status: None (Preliminary result)   Collection Time: 11/04/22  1:40 PM   Specimen: BLOOD  Result Value Ref Range Status   Specimen Description   Final    BLOOD LEFT ANTECUBITAL Performed at [facility], [address], [place], [place] [address]    Special Requests   Final    Blood Culture adequate volume BOTTLES DRAWN AEROBIC AND ANAEROBIC Performed at [facility], [address] [place], [place] [address]    Culture   Final    NO GROWTH < 24 HOURS Performed at [facility], [address] [place], [place] [address]    Report Status PENDING  Incomplete    Radiology Studies: DG CHEST PORT 1 VIEW  Result Date: 11/05/2022 CLINICAL DATA:  Short of breath EXAM: PORTABLE CHEST 1 VIEW COMPARISON:  11/04/2022 FINDINGS: LEFT cardiac silhouette is obscured large LEFT effusion. LEFT pleural effusion occupies 75% of LEFT hemithorax and is increased comparison exam. RIGHT lung  clear. No pneumothorax. IMPRESSION: Increasing large LEFT effusion occupying 75% of the LEFT hemithorax. Electronically Signed   By: Genevive Bi M.D.   On: 11/05/2022 10:14   CT Angio Chest PE W and/or Wo Contrast  Result Date: 11/04/2022 CLINICAL DATA:  Pneumonia and worsening shortness of breath EXAM: CT ANGIOGRAPHY CHEST WITH CONTRAST TECHNIQUE: Multidetector CT imaging of the chest was performed using the standard protocol during bolus administration of intravenous contrast. Multiplanar CT image reconstructions and MIPs were obtained to evaluate the vascular anatomy. RADIATION DOSE REDUCTION: This exam was performed according to the departmental dose-optimization program which includes automated exposure control, adjustment of the mA and/or kV according to patient size and/or use of iterative reconstruction technique. CONTRAST:  81mL OMNIPAQUE IOHEXOL 350 MG/ML SOLN COMPARISON:  Chest radiograph dated 10/30/2022 FINDINGS: Cardiovascular: The study is high quality for the evaluation of pulmonary embolism. There are no filling defects in the central, lobar, segmental or subsegmental pulmonary artery branches to suggest acute pulmonary embolism. Great vessels are normal in course and caliber. Normal heart size. No significant pericardial fluid/thickening. Mediastinum/Nodes: Imaged thyroid gland without nodules meeting criteria for imaging follow-up by size. Rightward shift of the mediastinum. Normal esophagus. Multi station lymphadenopathy including 1.3 cm left internal mammary lymph node(5:98), 2.0 cm subcarinal lymph node (4:59), and 1.4 cm prevascular lymph node (4:51). Lungs/Pleura: The central airways are patent. Near-complete atelectasis of the lingula and left lower lobe with heterogeneous consolidation resulting in marked narrowing of the left sided airways. Dependent ground-glass opacity in the left upper lobe. No pneumothorax. Large left pleural effusion. Upper abdomen: Normal. Musculoskeletal:  No acute or abnormal lytic or blastic osseous lesions. Review of the MIP images confirms the above findings. IMPRESSION: 1. No acute pulmonary embolism. 2. Near-complete atelectasis of the lingula and left lower lobe with heterogeneous consolidation resulting in marked narrowing of the left sided airways, likely multifocal pneumonia. 3. Large left pleural effusion. 4. Multi station mediastinal lymphadenopathy likely reactive Electronically Signed   By: Agustin Cree M.D.   On: 11/04/2022 14:51   DG Chest Port 1 View  Result Date: 11/04/2022 CLINICAL DATA:  Provided history: Left chest pain. EXAM: PORTABLE CHEST 1 VIEW COMPARISON:  Prior chest radiograph 10/30/2022. FINDINGS: Significant interval increase in size of a left pleural effusion, now moderate-to-large. Underlying atelectasis and/or airspace consolidation within the mid and lower left lung, also significantly progressed. No appreciable airspace consolidation on the right. Portions of the cardiac silhouette are obscured on the left, limiting evaluation of heart size. No evidence of pneumothorax. No acute bony abnormality identified. IMPRESSION: Significant interval increase in size of a left pleural effusion, now moderate-to-large. Underlying atelectasis and/or airspace consolidation within the mid and lower left lung has also significantly progressed from the prior examination of 10/30/2022. Electronically Signed   By: Jackey Loge D.O.   On: 11/04/2022 12:22    Scheduled Meds: Continuous Infusions:  sodium chloride Stopped (11/04/22 1705)   azithromycin     piperacillin-tazobactam (ZOSYN)  IV      LOS: 1 day   Marguerita Merles, DO [facility] Available via Epic secure chat 7am-7pm After these hours, please refer to coverage provider listed on [email or web address] 11/05/2022, 12:52 PM

## 2022-11-06 ENCOUNTER — Inpatient Hospital Stay (HOSPITAL_COMMUNITY): Payer: BC Managed Care – PPO

## 2022-11-06 DIAGNOSIS — J90 Pleural effusion, not elsewhere classified: Secondary | ICD-10-CM | POA: Diagnosis not present

## 2022-11-06 DIAGNOSIS — J918 Pleural effusion in other conditions classified elsewhere: Secondary | ICD-10-CM

## 2022-11-06 DIAGNOSIS — J189 Pneumonia, unspecified organism: Secondary | ICD-10-CM | POA: Diagnosis not present

## 2022-11-06 LAB — CBC WITH DIFFERENTIAL/PLATELET
Abs Immature Granulocytes: 0.08 10*3/uL — ABNORMAL HIGH (ref 0.00–0.07)
Basophils Absolute: 0.1 10*3/uL (ref 0.0–0.1)
Basophils Relative: 0 %
Eosinophils Absolute: 1.1 10*3/uL — ABNORMAL HIGH (ref 0.0–0.5)
Eosinophils Relative: 7 %
HCT: 34.7 % — ABNORMAL LOW (ref 36.0–46.0)
Hemoglobin: 11.4 g/dL — ABNORMAL LOW (ref 12.0–15.0)
Immature Granulocytes: 1 %
Lymphocytes Relative: 10 %
Lymphs Abs: 1.6 10*3/uL (ref 0.7–4.0)
MCH: 29.2 pg (ref 26.0–34.0)
MCHC: 32.9 g/dL (ref 30.0–36.0)
MCV: 89 fL (ref 80.0–100.0)
Monocytes Absolute: 1.2 10*3/uL — ABNORMAL HIGH (ref 0.1–1.0)
Monocytes Relative: 8 %
Neutro Abs: 11.3 10*3/uL — ABNORMAL HIGH (ref 1.7–7.7)
Neutrophils Relative %: 74 %
Platelets: 314 10*3/uL (ref 150–400)
RBC: 3.9 MIL/uL (ref 3.87–5.11)
RDW: 12.2 % (ref 11.5–15.5)
WBC: 15.3 10*3/uL — ABNORMAL HIGH (ref 4.0–10.5)
nRBC: 0 % (ref 0.0–0.2)

## 2022-11-06 LAB — COMPREHENSIVE METABOLIC PANEL
ALT: 14 U/L (ref 0–44)
AST: 12 U/L — ABNORMAL LOW (ref 15–41)
Albumin: 2.7 g/dL — ABNORMAL LOW (ref 3.5–5.0)
Alkaline Phosphatase: 50 U/L (ref 38–126)
Anion gap: 6 (ref 5–15)
BUN: <5 mg/dL — ABNORMAL LOW (ref 6–20)
CO2: 26 mmol/L (ref 22–32)
Calcium: 8 mg/dL — ABNORMAL LOW (ref 8.9–10.3)
Chloride: 104 mmol/L (ref 98–111)
Creatinine, Ser: 0.65 mg/dL (ref 0.44–1.00)
GFR, Estimated: >60 mL/min (ref >60)
Glucose, Bld: 118 mg/dL — ABNORMAL HIGH (ref 70–99)
Potassium: 3.7 mmol/L (ref 3.5–5.1)
Sodium: 136 mmol/L (ref 135–145)
Total Bilirubin: 0.7 mg/dL (ref 0.3–1.2)
Total Protein: 5.6 g/dL — ABNORMAL LOW (ref 6.5–8.1)

## 2022-11-06 LAB — RETICULOCYTES
Immature Retic Fract: 12.3 % (ref 2.3–15.9)
RBC.: 3.89 MIL/uL (ref 3.87–5.11)
Retic Count, Absolute: 42.4 10*3/uL (ref 19.0–186.0)
Retic Ct Pct: 1.1 % (ref 0.4–3.1)

## 2022-11-06 LAB — IRON AND TIBC
Iron: 22 ug/dL — ABNORMAL LOW (ref 28–170)
Saturation Ratios: 8 % — ABNORMAL LOW (ref 10.4–31.8)
TIBC: 272 ug/dL (ref 250–450)
UIBC: 250 ug/dL

## 2022-11-06 LAB — LEGIONELLA PNEUMOPHILA SEROGP 1 UR AG: L. pneumophila Serogp 1 Ur Ag: NEGATIVE

## 2022-11-06 LAB — FOLATE: Folate: 11.6 ng/mL (ref >5.9)

## 2022-11-06 LAB — TRIGLYCERIDES, BODY FLUIDS: Triglycerides, Fluid: 27 mg/dL

## 2022-11-06 LAB — MAGNESIUM: Magnesium: 1.8 mg/dL (ref 1.7–2.4)

## 2022-11-06 LAB — VITAMIN B12: Vitamin B-12: 201 pg/mL (ref 180–914)

## 2022-11-06 LAB — FERRITIN: Ferritin: 100 ng/mL (ref 11–307)

## 2022-11-06 LAB — PHOSPHORUS: Phosphorus: 2.9 mg/dL (ref 2.5–4.6)

## 2022-11-06 MED ORDER — LIDOCAINE HCL 1 % IJ SOLN
20.0000 mL | Freq: Once | INTRAMUSCULAR | Status: DC
  Filled 2022-11-06: qty 20

## 2022-11-06 MED ORDER — LORAZEPAM 0.5 MG PO TABS
0.2500 mg | ORAL_TABLET | Freq: Once | ORAL | Status: AC
  Administered 2022-11-07: 0.25 mg via ORAL
  Filled 2022-11-06: qty 1

## 2022-11-06 MED ORDER — AMOXICILLIN-POT CLAVULANATE 875-125 MG PO TABS
1.0000 | ORAL_TABLET | Freq: Two times a day (BID) | ORAL | Status: DC
  Administered 2022-11-06 – 2022-11-07 (×2): 1 via ORAL
  Filled 2022-11-06 (×2): qty 1

## 2022-11-06 MED ORDER — FENTANYL CITRATE PF 50 MCG/ML IJ SOSY: 25.0000 ug | PREFILLED_SYRINGE | Freq: Once | INTRAMUSCULAR | Status: DC

## 2022-11-06 MED ORDER — LORATADINE 10 MG PO TABS
5.0000 mg | ORAL_TABLET | Freq: Every day | ORAL | Status: DC
  Administered 2022-11-06 – 2022-11-08 (×3): 5 mg via ORAL
  Filled 2022-11-06 (×4): qty 1

## 2022-11-06 MED ORDER — SODIUM CHLORIDE 0.9% FLUSH
10.0000 mL | Freq: Three times a day (TID) | INTRAVENOUS | Status: DC
  Administered 2022-11-06 – 2022-11-08 (×6): 10 mL via INTRAPLEURAL

## 2022-11-06 MED ORDER — IBUPROFEN 200 MG PO TABS
600.0000 mg | ORAL_TABLET | Freq: Four times a day (QID) | ORAL | Status: AC
  Administered 2022-11-07 (×4): 600 mg via ORAL
  Filled 2022-11-06 (×4): qty 3

## 2022-11-06 MED ORDER — ACETAMINOPHEN-CODEINE 300-30 MG PO TABS: 1.0000 | ORAL_TABLET | ORAL | Status: DC | PRN

## 2022-11-06 MED ORDER — TRAMADOL HCL 50 MG PO TABS
50.0000 mg | ORAL_TABLET | Freq: Four times a day (QID) | ORAL | Status: DC | PRN
  Administered 2022-11-06: 50 mg via ORAL
  Filled 2022-11-06: qty 1

## 2022-11-06 MED ORDER — LORAZEPAM 2 MG/ML IJ SOLN
0.5000 mg | Freq: Once | INTRAMUSCULAR | Status: AC
  Administered 2022-11-06: 1 mg via INTRAVENOUS
  Filled 2022-11-06: qty 1

## 2022-11-06 MED ORDER — HYDROCODONE BIT-HOMATROP MBR 5-1.5 MG/5ML PO SOLN
5.0000 mL | ORAL | Status: DC | PRN
  Administered 2022-11-06: 5 mL via ORAL
  Filled 2022-11-06: qty 473

## 2022-11-06 MED ORDER — IBUPROFEN 800 MG PO TABS
800.0000 mg | ORAL_TABLET | Freq: Two times a day (BID) | ORAL | Status: DC
  Administered 2022-11-06: 800 mg via ORAL
  Filled 2022-11-06: qty 1

## 2022-11-06 MED ORDER — ACETAMINOPHEN 325 MG PO TABS
650.0000 mg | ORAL_TABLET | Freq: Three times a day (TID) | ORAL | Status: AC
  Administered 2022-11-06: 650 mg via ORAL
  Filled 2022-11-06 (×3): qty 2

## 2022-11-06 NOTE — Consult Note (Addendum)
PROGRESS NOTE (NOT CONSULT)     NAME:  Melissa Pineda, MRN:  [id number], DOB:  [birth year], LOS: 2 ADMISSION DATE:  11/04/2022, CONSULTATION DATE:  1/16 REFERRING MD:  Marland Mcalpine CHIEF COMPLAINT:  pneumonia and pleural effusion   History of Present Illness:  37 y/o F with PMH significant for anxiety who presented to the ED 1/15 with worsening shortness of breath and left flank pain.  She had been seen previously in the ED on 1/10 with cough for one week and was diagnosed with pneumonia, covid and flu were negative.  She was treated with doxycycline and albuterol and discharged home,  however symptoms worsened so she returned.    CXR showed L lingular pneumonia with near complete opacification and CT chest with large left pleural effusion.   Labs showed a WBC of 16k and strep pneumo was negative.   She was admitted to the hospitalists and treated with Rocephin and Azithromycin and has not required [place] oxygen.  PCCM consulted for pleural effusion.   She reports that her symptoms initially began approximately two months ago with a mild cold, however her dry cough persisted.  She initially went to urgent care and was given sudafed, then flew to [place] for Christmas and was seen again for cough.  States that her CXR was reportedly clear, so was treated with steroids.  She has had night sweats and chills, no measured fevers or weight loss.   Does not smoke or vape, no recent travel, occasionally smokes marijuana, but no other drug use, no regular alcohol use.   Pertinent  Medical History   has a past medical history of Anxiety.   Significant Hospital Events: Including procedures, antibiotic start and stop dates in addition to other pertinent events   1/15 presented to the ED with worsening L-sided pneumonia that failed outpatient treatment  1/16 PCCM consult, thoracentesis  Interim History / Subjective:  Afebrile  Pt reports she does not do well with narcotics, asking for anxiety meds for procedure  States she  feels terrible, SOB, can feel the fluid in her chest  Objective   Blood pressure 120/72, pulse (!) 107, temperature 98.3 F (36.8 C), temperature source Oral, resp. rate 16, height 5\' 6"  (1.676 m), weight 62.1 kg, last menstrual period 10/14/2022, SpO2 98 %, unknown if currently breastfeeding.        Intake/Output Summary (Last 24 hours) at 11/06/2022 1151 Last data filed at 11/06/2022 0516 Gross per 24 hour  Intake 1189.94 ml  Output --  Net 1189.94 ml   Filed Weights   11/05/22 1721  Weight: 62.1 kg   Exam: General: adult female, ill appearing but not in acute distress lying in bed  HEENT: MM pink/moist, anicteric, good dentition Neuro: AAOx4, speech clear, MAE CV: s1s2 RRR, no m/r/g PULM: non-labored at rest, diminished on left, clear on right  GI: soft, bsx4 active  Extremities: warm/dry, no edema  Skin: no rashes or lesions  Resolved Hospital Problem list     Assessment & Plan:   LLL PNA and Parapneumonic / Exudative Pleural Effusion  Recent URI  Recent URI with failure of outpatient treatment with prednisone, cough suppression & doxycycline. COVID / Flu negative / RVP. S/p thoracentesis 1/16 with 1.5L volume removed. Subsequent re-accumulation on CXR 1/17.  Pleural fluid with LDH 583, glucose 76, TNC 1,994. Exudative by LDH (ratio 2.1).    -pulmonary hygiene -IS, mobilize -reviewed plan for chest tube placement with patient, small bore. Risks/benefits reviewed.  -plan for IV ativan on  call for procedure  -continue zosyn -follow pleural studies, blood cultures  -send repeat cytology on pleural fluid 1/17 with lymph predominance  -follow blood cultures  -consider repeat CT chest once pleural fluid drained / prior to chest tube removal  -follow CXR while chest tube in place   Best Practice (right click and "Reselect all SmartList Selections" daily)  Per primary      Canary Brim, MSN, APRN, NP-C, AGACNP-BC [facility] 11/06/2022, 1:16  PM   Please see [email or web address] for pager details.   From 7A-7P if no response, please call [phone] After hours, please call ELink [phone]

## 2022-11-06 NOTE — Progress Notes (Signed)
..  Transition of Care (TOC) Screening Note   Patient Details  Name: Melissa Pineda Date of Birth: May 18, 1986   Transition of Care [facility]) CM/SW Contact:    Larrie Kass, LCSW Phone Number: 11/06/2022, 10:43 AM    Transition of Care Department [facility]) has reviewed patient and no TOC needs have been identified at this time. We will continue to monitor patient advancement through interdisciplinary progression rounds. If new patient transition needs arise, please place a TOC consult.

## 2022-11-06 NOTE — Procedures (Signed)
Insertion of Chest Tube Procedure Note  Melissa Pineda  [id number]  1986-05-29  Date:11/06/22  Time:4:42 PM    Provider Performing: Comer Locket. Mariel Lukins   Procedure: Pleural Catheter Insertion w/ Imaging Guidance (88361)  Indication(s) Effusion  Consent Risks of the procedure as well as the alternatives and risks of each were explained to the patient and/or caregiver.  Consent for the procedure was obtained and is signed in the bedside chart  Anesthesia Topical only with 1% lidocaine    Time Out Verified patient identification, verified procedure, site/side was marked, verified correct patient position, special equipment/implants available, medications/allergies/relevant history reviewed, required imaging and test results available.   Sterile Technique Maximal sterile technique including full sterile barrier drape, hand hygiene, sterile gown, sterile gloves, mask, hair covering, sterile ultrasound probe cover (if used).   Procedure Description Ultrasound used to identify appropriate pleural anatomy for placement and overlying skin marked. Area of placement cleaned and draped in sterile fashion.  A 14 French pigtail pleural catheter was placed into the left pleural space using Seldinger technique. Appropriate return of fluid was obtained.  The tube was connected to atrium and placed to gravity   Complications/Tolerance None; patient tolerated the procedure well. Chest X-ray is ordered to verify placement.   EBL Minimal  Specimen(s) fluid for cytology  Melissa Pineda V. Vassie Loll MD

## 2022-11-06 NOTE — Progress Notes (Signed)
PROGRESS NOTE    Keller Mikels  DGU:[id number] DOB: 12-Jul-1986 DOA: 11/04/2022 PCP: Patient, No Pcp Per   Brief Narrative:  37 year old Caucasian known anxiety She first developed this 8 weeks prior, went to urgent care, received steroids 12/29 at [facility], then when she stopped the steroids, and felt worse , was diagnosed with a pneumonia 10/30/22 in ER and was discharged home on doxycycline and an albuterol inhaler.    returned to the ED 1/15 with worsening shortness of breath, cough, as well as fever.  +  pLeuritic chest pain that was 9 out of 10 in severity and left-sided in the ED she was found to have a lingular pneumonia with near complete opacification of the area with a large left-sided pleural effusion.   also noted to have reactive lymphadenopathy and she is admitted to the hospital with community-acquired pneumonia and failed outpatient treatment as well as a left-sided pleural effusion.    Chest x-ray done today showed worsening of her pleural effusion so pulmonary was consulted and they did a bedside thoracentesis which drained 1.9 L of blood-tinged cloudy fluid.   Assessment and Plan: No notes have been filed under this hospital service. Service: Hospitalist  Left Lower Lobe PNA with Pleural and Parapneumonic Effusion -Admitted to inpatient telemetry -CTA Scan Chest PE = atelectasis consolidation and multifocal pneumonia with large left-sided pleural effusion and reactive lymphadenopathy.  -C/w Albuterol 2.5 mg Neb q6hprn Wheezing -Added Guaifenesin 1200 mg po BID, Flutter Valve, Incentive Spirometry -C/w Abx with IV Zosyn  -IF necessary will place on Xopenex/Atrovent -Pulmonary consulted and they did a Bedside Thoracentesis which yieled 1900 mL of blood tinged and cloudy fluid -Pleural fluid seems exudative with close to 2000 nucleated cells, follow cytology -Repeat CXR shows continued persistent consolidation/ fluid--defer to them Chest tube vs rpt Thora--Source and etiology  unclear--I have reassured patient this is sometimes the case, I doubt there is any other sinister pathology -RVP and all other tests for infection neg   Anxiety Disorder -Not on any Meds -Had to be given 1 mg of Alprazolam po x1 -May initiate Hydroxyzine     Hyponatremia -resolved  Leukocytosis -expected with underlying infeciton.  Monitor trends  Normocytic Anemia -mildy iron def--start iron on d/c -Continue to Monitor for S/Sx of Bleeding; No overt bleeding noted -Repeat CBC in the AM   DVT prophylaxis: SCDs Start: 11/04/22 2336    Code Status: Full Code Family Communication: discussed the case with her aunt over the telephone 1/17  Disposition Plan:  Level of care: Telemetry Status is: Inpatient Remains inpatient appropriate because: His further clinical improvement and evaluation by pulmonary   Consultants:  Pulmonary  Procedures:  As delineated above and she had a thoracentesis  Antimicrobials:     Subjective:  Some pain with moving Some bitemporal discomfort as well No fever chills n/v Tells m,e sone was well   Objective: Vitals:   11/05/22 1114 11/05/22 1721 11/05/22 2032 11/06/22 0519  BP: 122/72  123/72 120/72  Pulse: (!) 123  (!) 101 (!) 107  Resp: 18  16   Temp: 98.3 F (36.8 C)  98.5 F (36.9 C) 98.3 F (36.8 C)  TempSrc: Oral  Oral Oral  SpO2: 98%  94% 98%  Weight:  62.1 kg    Height:  5\' 6"  (1.676 m)      Intake/Output Summary (Last 24 hours) at 11/06/2022 0849 Last data filed at 11/06/2022 0516 Gross per 24 hour  Intake 1309.94 ml  Output --  Net  1309.94 ml    Filed Weights   11/05/22 1721  Weight: 62.1 kg    Examination: Physical Exam:  Awake pleasant--mild frontal and maxillary tender Cta b, decreased AE L post lung, dull percussion note S1 s2 no m, tele sinus tach at times, no arrythmia Abd soft, no rebound No LE edema Power 5/5   Data Reviewed: I have personally reviewed following labs and imaging  studies  CBC: Recent Labs  Lab 10/30/22 1033 11/04/22 1153 11/05/22 0419 11/06/22 0432  WBC 13.4* 16.6* 15.3* 15.3*  NEUTROABS 9.8* 13.5*  --  11.3*  HGB 12.8 13.1 11.3* 11.4*  HCT 39.3 39.1 35.5* 34.7*  MCV 88.7 88.1 91.0 89.0  PLT 357 347 311 314    Basic Metabolic Panel: Recent Labs  Lab 10/30/22 1033 11/04/22 1153 11/05/22 0419 11/05/22 0425 11/06/22 0432  NA 137 134* 136  --  136  K 3.6 3.7 3.7  --  3.7  CL 101 100 103  --  104  CO2 25 24 23   --  26  GLUCOSE 115* 145* 142*  --  118*  BUN 8 7 5*  --  <5*  CREATININE 0.62 0.56 0.49  --  0.65  CALCIUM 9.7 9.0 8.1*  --  8.0*  MG  --  1.8  --  1.9 1.8  PHOS  --  2.1*  --  2.5 2.9    GFR: Estimated Creatinine Clearance: 91.9 mL/min (by C-G formula based on SCr of 0.65 mg/dL). Liver Function Tests: Recent Labs  Lab 11/04/22 1153 11/05/22 0419 11/06/22 0432  AST 10* 19 12*  ALT 8 14 14   ALKPHOS 60 56 50  BILITOT 1.0 0.5 0.7  PROT 7.0 6.1* 5.6*  ALBUMIN 3.8 3.1* 2.7*    Recent Labs  Lab 11/04/22 1153  LIPASE <10*    No results for input(s): "AMMONIA" in the last 168 hours. Coagulation Profile: Recent Labs  Lab 11/05/22 1110  INR 1.2    Cardiac Enzymes: No results for input(s): "CKTOTAL", "CKMB", "CKMBINDEX", "TROPONINI" in the last 168 hours. BNP (last 3 results) No results for input(s): "PROBNP" in the last 8760 hours. HbA1C: No results for input(s): "HGBA1C" in the last 72 hours. CBG: No results for input(s): "GLUCAP" in the last 168 hours. Lipid Profile: No results for input(s): "CHOL", "HDL", "LDLCALC", "TRIG", "CHOLHDL", "LDLDIRECT" in the last 72 hours. Thyroid Function Tests: No results for input(s): "TSH", "T4TOTAL", "FREET4", "T3FREE", "THYROIDAB" in the last 72 hours. Anemia Panel: Recent Labs    11/06/22 0432  VITAMINB12 201  FOLATE 11.6  FERRITIN 100  TIBC 272  IRON 22*  RETICCTPCT 1.1   Sepsis Labs: No results for input(s): "PROCALCITON", "LATICACIDVEN" in the last  168 hours.  Recent Results (from the past 240 hour(s))  Resp panel by RT-PCR (RSV, Flu A&B, Covid) Nasopharyngeal Swab     Status: None   Collection Time: 10/30/22 11:15 AM   Specimen: Nasopharyngeal Swab; Nasal Swab  Result Value Ref Range Status   SARS Coronavirus 2 by RT PCR NEGATIVE NEGATIVE Final    Comment: (NOTE) SARS-CoV-2 target nucleic acids are NOT DETECTED.  The SARS-CoV-2 RNA is generally detectable in upper respiratory specimens during the acute phase of infection. The lowest concentration of SARS-CoV-2 viral copies this assay can detect is 138 copies/mL. A negative result does not preclude SARS-Cov-2 infection and should not be used as the sole basis for treatment or other patient management decisions. A negative result may occur with  improper specimen collection/handling, submission  of specimen other than nasopharyngeal swab, presence of viral mutation(s) within the areas targeted by this assay, and inadequate number of viral copies(<138 copies/mL). A negative result must be combined with clinical observations, patient history, and epidemiological information. The expected result is Negative.  Fact Sheet for Patients:  BloggerCourse.com  Fact Sheet for Healthcare Providers:  SeriousBroker.it  This test is no t yet approved or cleared by the [place] FDA and  has been authorized for detection and/or diagnosis of SARS-CoV-2 by FDA under an Emergency Use Authorization (EUA). This EUA will remain  in effect (meaning this test can be used) for the duration of the COVID-19 declaration under Section 564(b)(1) of the Act, 21 U.S.C.section 360bbb-3(b)(1), unless the authorization is terminated  or revoked sooner.       Influenza A by PCR NEGATIVE NEGATIVE Final   Influenza B by PCR NEGATIVE NEGATIVE Final    Comment: (NOTE) The Xpert Xpress SARS-CoV-2/FLU/RSV plus assay is intended as an aid in the diagnosis of  influenza from Nasopharyngeal swab specimens and should not be used as a sole basis for treatment. Nasal washings and aspirates are unacceptable for Xpert Xpress SARS-CoV-2/FLU/RSV testing.  Fact Sheet for Patients: BloggerCourse.com  Fact Sheet for Healthcare Providers: SeriousBroker.it  This test is not yet approved or cleared by the [place] FDA and has been authorized for detection and/or diagnosis of SARS-CoV-2 by FDA under an Emergency Use Authorization (EUA). This EUA will remain in effect (meaning this test can be used) for the duration of the COVID-19 declaration under Section 564(b)(1) of the Act, 21 U.S.C. section 360bbb-3(b)(1), unless the authorization is terminated or revoked.     Resp Syncytial Virus by PCR NEGATIVE NEGATIVE Final    Comment: (NOTE) Fact Sheet for Patients: BloggerCourse.com  Fact Sheet for Healthcare Providers: SeriousBroker.it  This test is not yet approved or cleared by the [place] FDA and has been authorized for detection and/or diagnosis of SARS-CoV-2 by FDA under an Emergency Use Authorization (EUA). This EUA will remain in effect (meaning this test can be used) for the duration of the COVID-19 declaration under Section 564(b)(1) of the Act, 21 U.S.C. section 360bbb-3(b)(1), unless the authorization is terminated or revoked.  Performed at [facility], [address] [place], [place] [address]   Blood culture (routine x 2)     Status: None (Preliminary result)   Collection Time: 11/04/22  1:20 PM   Specimen: BLOOD RIGHT HAND  Result Value Ref Range Status   Specimen Description   Final    BLOOD RIGHT HAND Performed at [facility], [address] [place], [place] [address]    Special Requests   Final    BOTTLES DRAWN AEROBIC AND ANAEROBIC Blood Culture adequate volume Performed at  [facility], [address], [place], [place] [address]    Culture   Final    NO GROWTH < 24 HOURS Performed at [facility], [address] [place], [place] [address]    Report Status PENDING  Incomplete  Blood culture (routine x 2)     Status: None (Preliminary result)   Collection Time: 11/04/22  1:40 PM   Specimen: BLOOD  Result Value Ref Range Status   Specimen Description   Final    BLOOD LEFT ANTECUBITAL Performed at [facility], [address] [place], [place] [address]    Special Requests   Final    Blood Culture adequate volume BOTTLES DRAWN AEROBIC AND ANAEROBIC Performed at [facility], [address], [place], [place] [address]    Culture   Final    NO GROWTH < 24 HOURS Performed at [facility], [address] [place], [place] [address]    Report Status PENDING  Incomplete  Respiratory (~20 pathogens) panel by PCR     Status: None   Collection Time: 11/05/22 10:36 AM   Specimen: Nasopharyngeal Swab; Respiratory  Result Value Ref Range Status   Adenovirus NOT DETECTED NOT DETECTED Final   Coronavirus 229E NOT DETECTED NOT DETECTED Final    Comment: (NOTE) The Coronavirus on the Respiratory Panel, DOES NOT test for the novel  Coronavirus (2019 nCoV)    Coronavirus HKU1 NOT DETECTED NOT DETECTED Final   Coronavirus NL63 NOT DETECTED NOT DETECTED Final   Coronavirus OC43 NOT DETECTED NOT DETECTED Final   Metapneumovirus NOT DETECTED NOT DETECTED Final   Rhinovirus / Enterovirus NOT DETECTED NOT DETECTED Final   Influenza A NOT DETECTED NOT DETECTED Final   Influenza B NOT DETECTED NOT DETECTED Final   Parainfluenza Virus 1 NOT DETECTED NOT DETECTED Final   Parainfluenza Virus 2 NOT DETECTED NOT DETECTED Final   Parainfluenza Virus 3 NOT DETECTED NOT DETECTED Final   Parainfluenza Virus 4 NOT DETECTED NOT DETECTED Final   Respiratory Syncytial Virus NOT DETECTED NOT DETECTED Final    Bordetella pertussis NOT DETECTED NOT DETECTED Final   Bordetella Parapertussis NOT DETECTED NOT DETECTED Final   Chlamydophila pneumoniae NOT DETECTED NOT DETECTED Final   Mycoplasma pneumoniae NOT DETECTED NOT DETECTED Final    Comment: Performed at [facility], [address], [place], [place] [address]    Radiology Studies: DG CHEST PORT 1 VIEW  Result Date: 11/06/2022 CLINICAL DATA:  Follow-up pleural effusion. EXAM: PORTABLE CHEST 1 VIEW COMPARISON:  11/05/22 FINDINGS: Stable cardiomediastinal contours. There is a large left pleural effusion which appears unchanged in volume from the previous exam. Right lung appears clear. No new findings. IMPRESSION: No change in large left pleural effusion. Electronically Signed   By: Signa Kell M.D.   On: 11/06/2022 08:15   DG CHEST PORT 1 VIEW  Result Date: 11/05/2022 CLINICAL DATA:  Post thoracentesis. EXAM: PORTABLE CHEST 1 VIEW COMPARISON:  Chest x-ray from earlier same day. FINDINGS: Decreased size of the LEFT pleural effusion status post thoracentesis. Large opacity persists at the LEFT lung base, likely residual pleural effusion and/or atelectasis/airspace collapse. RIGHT lung is clear. No pneumothorax is seen. Heart size and mediastinal contours are grossly stable. IMPRESSION: 1. Decreased size of the LEFT pleural effusion status post thoracentesis. No pneumothorax. 2. Large opacity persists at the LEFT lung base, likely residual pleural effusion and/or atelectasis/airspace collapse. Electronically Signed   By: Bary Richard M.D.   On: 11/05/2022 13:24   DG CHEST PORT 1 VIEW  Result Date: 11/05/2022 CLINICAL DATA:  Short of breath EXAM: PORTABLE CHEST 1 VIEW COMPARISON:  11/04/2022 FINDINGS: LEFT cardiac silhouette is obscured large LEFT effusion. LEFT pleural effusion occupies 75% of LEFT hemithorax and is increased comparison exam. RIGHT lung clear. No pneumothorax. IMPRESSION: Increasing large LEFT effusion occupying 75% of the LEFT  hemithorax. Electronically Signed   By: Genevive Bi M.D.   On: 11/05/2022 10:14   CT Angio Chest PE W and/or Wo Contrast  Result Date: 11/04/2022 CLINICAL DATA:  Pneumonia and worsening shortness of breath EXAM: CT ANGIOGRAPHY CHEST WITH CONTRAST TECHNIQUE: Multidetector CT imaging of the chest was performed using the standard protocol during bolus administration of intravenous  contrast. Multiplanar CT image reconstructions and MIPs were obtained to evaluate the vascular anatomy. RADIATION DOSE REDUCTION: This exam was performed according to the departmental dose-optimization program which includes automated exposure control, adjustment of the mA and/or kV according to patient size and/or use of iterative reconstruction technique. CONTRAST:  66mL OMNIPAQUE IOHEXOL 350 MG/ML SOLN COMPARISON:  Chest radiograph dated 10/30/2022 FINDINGS: Cardiovascular: The study is high quality for the evaluation of pulmonary embolism. There are no filling defects in the central, lobar, segmental or subsegmental pulmonary artery branches to suggest acute pulmonary embolism. Great vessels are normal in course and caliber. Normal heart size. No significant pericardial fluid/thickening. Mediastinum/Nodes: Imaged thyroid gland without nodules meeting criteria for imaging follow-up by size. Rightward shift of the mediastinum. Normal esophagus. Multi station lymphadenopathy including 1.3 cm left internal mammary lymph node(5:98), 2.0 cm subcarinal lymph node (4:59), and 1.4 cm prevascular lymph node (4:51). Lungs/Pleura: The central airways are patent. Near-complete atelectasis of the lingula and left lower lobe with heterogeneous consolidation resulting in marked narrowing of the left sided airways. Dependent ground-glass opacity in the left upper lobe. No pneumothorax. Large left pleural effusion. Upper abdomen: Normal. Musculoskeletal: No acute or abnormal lytic or blastic osseous lesions. Review of the MIP images confirms the  above findings. IMPRESSION: 1. No acute pulmonary embolism. 2. Near-complete atelectasis of the lingula and left lower lobe with heterogeneous consolidation resulting in marked narrowing of the left sided airways, likely multifocal pneumonia. 3. Large left pleural effusion. 4. Multi station mediastinal lymphadenopathy likely reactive Electronically Signed   By: Agustin Cree M.D.   On: 11/04/2022 14:51   DG Chest Port 1 View  Result Date: 11/04/2022 CLINICAL DATA:  Provided history: Left chest pain. EXAM: PORTABLE CHEST 1 VIEW COMPARISON:  Prior chest radiograph 10/30/2022. FINDINGS: Significant interval increase in size of a left pleural effusion, now moderate-to-large. Underlying atelectasis and/or airspace consolidation within the mid and lower left lung, also significantly progressed. No appreciable airspace consolidation on the right. Portions of the cardiac silhouette are obscured on the left, limiting evaluation of heart size. No evidence of pneumothorax. No acute bony abnormality identified. IMPRESSION: Significant interval increase in size of a left pleural effusion, now moderate-to-large. Underlying atelectasis and/or airspace consolidation within the mid and lower left lung has also significantly progressed from the prior examination of 10/30/2022. Electronically Signed   By: Jackey Loge D.O.   On: 11/04/2022 12:22    Scheduled Meds:  guaiFENesin  1,200 mg Oral BID   Continuous Infusions:  sodium chloride Stopped (11/04/22 1705)   piperacillin-tazobactam (ZOSYN)  IV 3.375 g (11/06/22 0516)    LOS: 2 days   Marguerita Merles, DO [facility] Available via Epic secure chat 7am-7pm After these hours, please refer to coverage provider listed on [email or web address] 11/06/2022, 8:49 AM

## 2022-11-07 ENCOUNTER — Ambulatory Visit: Payer: BC Managed Care – PPO

## 2022-11-07 ENCOUNTER — Other Ambulatory Visit: Payer: Self-pay | Admitting: Internal Medicine

## 2022-11-07 ENCOUNTER — Inpatient Hospital Stay (HOSPITAL_COMMUNITY): Payer: BC Managed Care – PPO

## 2022-11-07 ENCOUNTER — Other Ambulatory Visit: Payer: Self-pay | Admitting: *Deleted

## 2022-11-07 DIAGNOSIS — C3492 Malignant neoplasm of unspecified part of left bronchus or lung: Secondary | ICD-10-CM

## 2022-11-07 DIAGNOSIS — J910 Malignant pleural effusion: Secondary | ICD-10-CM

## 2022-11-07 DIAGNOSIS — J189 Pneumonia, unspecified organism: Secondary | ICD-10-CM | POA: Diagnosis not present

## 2022-11-07 LAB — COMPREHENSIVE METABOLIC PANEL
ALT: 13 U/L (ref 0–44)
AST: 12 U/L — ABNORMAL LOW (ref 15–41)
Albumin: 2.6 g/dL — ABNORMAL LOW (ref 3.5–5.0)
Alkaline Phosphatase: 49 U/L (ref 38–126)
Anion gap: 8 (ref 5–15)
BUN: <5 mg/dL — ABNORMAL LOW (ref 6–20)
CO2: 27 mmol/L (ref 22–32)
Calcium: 8.2 mg/dL — ABNORMAL LOW (ref 8.9–10.3)
Chloride: 101 mmol/L (ref 98–111)
Creatinine, Ser: 0.51 mg/dL (ref 0.44–1.00)
GFR, Estimated: >60 mL/min (ref >60)
Glucose, Bld: 106 mg/dL — ABNORMAL HIGH (ref 70–99)
Potassium: 4.3 mmol/L (ref 3.5–5.1)
Sodium: 136 mmol/L (ref 135–145)
Total Bilirubin: 0.7 mg/dL (ref 0.3–1.2)
Total Protein: 5.8 g/dL — ABNORMAL LOW (ref 6.5–8.1)

## 2022-11-07 LAB — CBC
HCT: 39.6 % (ref 36.0–46.0)
Hemoglobin: 12.8 g/dL (ref 12.0–15.0)
MCH: 29 pg (ref 26.0–34.0)
MCHC: 32.3 g/dL (ref 30.0–36.0)
MCV: 89.6 fL (ref 80.0–100.0)
Platelets: 323 10*3/uL (ref 150–400)
RBC: 4.42 MIL/uL (ref 3.87–5.11)
RDW: 12.2 % (ref 11.5–15.5)
WBC: 15 10*3/uL — ABNORMAL HIGH (ref 4.0–10.5)
nRBC: 0 % (ref 0.0–0.2)

## 2022-11-07 LAB — CYTOLOGY - NON PAP

## 2022-11-07 MED ORDER — TRAMADOL HCL 50 MG PO TABS: 100.0000 mg | ORAL_TABLET | Freq: Four times a day (QID) | ORAL | Status: DC | PRN

## 2022-11-07 MED ORDER — GADOBUTROL 1 MMOL/ML IV SOLN
6.0000 mL | Freq: Once | INTRAVENOUS | Status: AC | PRN
  Administered 2022-11-07: 6 mL via INTRAVENOUS

## 2022-11-07 MED ORDER — IOHEXOL 9 MG/ML PO SOLN
ORAL | Status: AC
  Administered 2022-11-07: 500 mL
  Filled 2022-11-07: qty 1000

## 2022-11-07 MED ORDER — IOHEXOL 300 MG/ML  SOLN
100.0000 mL | Freq: Once | INTRAMUSCULAR | Status: AC | PRN
  Administered 2022-11-07: 100 mL via INTRAVENOUS

## 2022-11-07 MED ORDER — ALPRAZOLAM 0.5 MG PO TABS
0.5000 mg | ORAL_TABLET | Freq: Three times a day (TID) | ORAL | Status: DC | PRN
  Administered 2022-11-07 – 2022-11-08 (×2): 0.5 mg via ORAL
  Filled 2022-11-07 (×2): qty 1

## 2022-11-07 MED ORDER — HALOPERIDOL LACTATE 5 MG/ML IJ SOLN
1.0000 mg | Freq: Once | INTRAMUSCULAR | Status: AC
  Administered 2022-11-07: 1 mg via INTRAVENOUS
  Filled 2022-11-07: qty 1

## 2022-11-07 NOTE — Progress Notes (Signed)
eLink Physician-Brief Progress Note Patient Name: Melissa Pineda DOB: [birth year] MRN: [id number]   Date of Service  11/07/2022  HPI/Events of Note  Patient wants to talk to a PCCM physician about having her chest tube discontinued.  eICU Interventions  CT chest images / results not yet available, will ask PCCM ground crew to go and talk with family after images result.        Thomasene Lot Mabelle Mungin 11/07/2022, 8:15 PM

## 2022-11-07 NOTE — Progress Notes (Signed)
PROGRESS NOTE    Naziah Portee  ZOX:[id number] DOB: [birth year] DOA: 11/04/2022 PCP: Patient, No Pcp Per   Brief Narrative:  37 year old Caucasian known anxiety She first developed this 8 weeks prior, went to urgent care, received steroids 12/29 at [facility], then when she stopped the steroids, and felt worse , was diagnosed with a pneumonia 10/30/22 in ER and was discharged home on doxycycline and an albuterol inhaler.    returned to the ED 1/15 with worsening shortness of breath, cough, as well as fever.  +  pLeuritic chest pain that was 9 out of 10 in severity and left-sided in the ED  found to have a lingular pneumonia with near complete opacification of the area with a large left-sided pleural effusion.   also reactive lymphadenopathy and she is admitted to the hospital with community-acquired pneumonia and failed outpatient treatment as well as a left-sided pleural effusion.    Chest x-ray --worsening of her pleural effusion  1/16 pulmonary was consulted and they did a bedside thoracentesis which drained 1.9 L of blood-tinged cloudy fluid.  1/17 pigtail catheter placed after rapid reaccumulation of fluid 1/18 pathology shows adenocarcinoma lung oncology consulted  Assessment and Plan: No notes have been filed under this hospital service. Service: Hospitalist  Likely stage IV adenocarcinoma lung -Initial thought process was that patient had an infectious etiology but with rapid recurrence, lymphocytic predominance, and now positive cytology this is unlikely -Long discussion with patient family 1/18 , with oncologist in the room Dr. Earlie Server -Pleural studies showed adenocarcinoma and pleural fluid qualifying this as stage IV disease - Guardant and foundation 1 studies have been sent -CT chest abdomen pelvis as well as MRI brain will be performed - Pleural fluid on looks much diminished, defer to pulmonology when to pull catheter - Discontinue Augmentin  Anxiety Disorder -Not on any  Meds -Had to be given 1 mg of Alprazolam po x1, started Xanax 0.5 3 times daily and adjust dose as needed  Hyponatremia -resolved  Leukocytosis -Probably related to underlying malignancy-would not further check  Normocytic Anemia, iron level 22 saturations 8 -Treat with iron as an outpatient -Repeat CBC in the AM   DVT prophylaxis: SCDs Start: 11/04/22 2336    Code Status: Full Code Family Communication: discussed the case with her husband as well as mother at the bedside, aunt over the telephone 1/18  Disposition Plan:  Level of care: Med-Surg Status is: Inpatient Remains inpatient appropriate because:   Further chest tube planning and discussion about removal of the same    Consultants:  Pulmonary  Procedures:  As delineated above and she had a thoracentesis  Antimicrobials:     Subjective:  Overall fair Devastated at the diagnosis and working through it-husband and mother present Aunt on the phone   Objective: Vitals:   11/06/22 1541 11/06/22 1600 11/06/22 2137 11/07/22 0619  BP:   111/75 117/76  Pulse: (!) 105 (!) 111 (!) 102 (!) 109  Resp:   20 18  Temp:   98.2 F (36.8 C) 98.2 F (36.8 C)  TempSrc:   Oral Oral  SpO2: 100% 100% 98% 98%  Weight:      Height:        Intake/Output Summary (Last 24 hours) at 11/07/2022 1443 Last data filed at 11/07/2022 1120 Gross per 24 hour  Intake 100 ml  Output 1650 ml  Net -1550 ml    Filed Weights   11/05/22 1721  Weight: 62.1 kg    Examination: Physical Exam:  Tearful appropriately awake alert Chest seems clear posterolaterally with decreased air entry on left field but improved from prior S1-S2 no murmur Abdomen soft Neuro is intact   Data Reviewed: I have personally reviewed following labs and imaging studies  CBC: Recent Labs  Lab 11/04/22 1153 11/05/22 0419 11/06/22 0432 11/07/22 0429  WBC 16.6* 15.3* 15.3* 15.0*  NEUTROABS 13.5*  --  11.3*  --   HGB 13.1 11.3* 11.4* 12.8  HCT 39.1  35.5* 34.7* 39.6  MCV 88.1 91.0 89.0 89.6  PLT 347 311 314 323    Basic Metabolic Panel: Recent Labs  Lab 11/04/22 1153 11/05/22 0419 11/05/22 0425 11/06/22 0432 11/07/22 0429  NA 134* 136  --  136 136  K 3.7 3.7  --  3.7 4.3  CL 100 103  --  104 101  CO2 24 23  --  26 27  GLUCOSE 145* 142*  --  118* 106*  BUN 7 5*  --  <5* <5*  CREATININE 0.56 0.49  --  0.65 0.51  CALCIUM 9.0 8.1*  --  8.0* 8.2*  MG 1.8  --  1.9 1.8  --   PHOS 2.1*  --  2.5 2.9  --     GFR: Estimated Creatinine Clearance: 91.9 mL/min (by C-G formula based on SCr of 0.51 mg/dL). Liver Function Tests: Recent Labs  Lab 11/04/22 1153 11/05/22 0419 11/06/22 0432 11/07/22 0429  AST 10* 19 12* 12*  ALT 8 14 14 13   ALKPHOS 60 56 50 49  BILITOT 1.0 0.5 0.7 0.7  PROT 7.0 6.1* 5.6* 5.8*  ALBUMIN 3.8 3.1* 2.7* 2.6*    Recent Labs  Lab 11/04/22 1153  LIPASE <10*    No results for input(s): "AMMONIA" in the last 168 hours. Coagulation Profile: Recent Labs  Lab 11/05/22 1110  INR 1.2    Cardiac Enzymes: No results for input(s): "CKTOTAL", "CKMB", "CKMBINDEX", "TROPONINI" in the last 168 hours. BNP (last 3 results) No results for input(s): "PROBNP" in the last 8760 hours. HbA1C: No results for input(s): "HGBA1C" in the last 72 hours. CBG: No results for input(s): "GLUCAP" in the last 168 hours. Lipid Profile: No results for input(s): "CHOL", "HDL", "LDLCALC", "TRIG", "CHOLHDL", "LDLDIRECT" in the last 72 hours. Thyroid Function Tests: No results for input(s): "TSH", "T4TOTAL", "FREET4", "T3FREE", "THYROIDAB" in the last 72 hours. Anemia Panel: Recent Labs    11/06/22 0432  VITAMINB12 201  FOLATE 11.6  FERRITIN 100  TIBC 272  IRON 22*  RETICCTPCT 1.1    Sepsis Labs: No results for input(s): "PROCALCITON", "LATICACIDVEN" in the last 168 hours.  Recent Results (from the past 240 hour(s))  Resp panel by RT-PCR (RSV, Flu A&B, Covid) Nasopharyngeal Swab     Status: None   Collection  Time: 10/30/22 11:15 AM   Specimen: Nasopharyngeal Swab; Nasal Swab  Result Value Ref Range Status   SARS Coronavirus 2 by RT PCR NEGATIVE NEGATIVE Final    Comment: (NOTE) SARS-CoV-2 target nucleic acids are NOT DETECTED.  The SARS-CoV-2 RNA is generally detectable in upper respiratory specimens during the acute phase of infection. The lowest concentration of SARS-CoV-2 viral copies this assay can detect is 138 copies/mL. A negative result does not preclude SARS-Cov-2 infection and should not be used as the sole basis for treatment or other patient management decisions. A negative result may occur with  improper specimen collection/handling, submission of specimen other than nasopharyngeal swab, presence of viral mutation(s) within the areas targeted by this assay, and inadequate number  of viral copies(<138 copies/mL). A negative result must be combined with clinical observations, patient history, and epidemiological information. The expected result is Negative.  Fact Sheet for Patients:  BloggerCourse.com  Fact Sheet for Healthcare Providers:  SeriousBroker.it  This test is no t yet approved or cleared by the [place] FDA and  has been authorized for detection and/or diagnosis of SARS-CoV-2 by FDA under an Emergency Use Authorization (EUA). This EUA will remain  in effect (meaning this test can be used) for the duration of the COVID-19 declaration under Section 564(b)(1) of the Act, 21 U.S.C.section 360bbb-3(b)(1), unless the authorization is terminated  or revoked sooner.       Influenza A by PCR NEGATIVE NEGATIVE Final   Influenza B by PCR NEGATIVE NEGATIVE Final    Comment: (NOTE) The Xpert Xpress SARS-CoV-2/FLU/RSV plus assay is intended as an aid in the diagnosis of influenza from Nasopharyngeal swab specimens and should not be used as a sole basis for treatment. Nasal washings and aspirates are unacceptable for  Xpert Xpress SARS-CoV-2/FLU/RSV testing.  Fact Sheet for Patients: BloggerCourse.com  Fact Sheet for Healthcare Providers: SeriousBroker.it  This test is not yet approved or cleared by the [place] FDA and has been authorized for detection and/or diagnosis of SARS-CoV-2 by FDA under an Emergency Use Authorization (EUA). This EUA will remain in effect (meaning this test can be used) for the duration of the COVID-19 declaration under Section 564(b)(1) of the Act, 21 U.S.C. section 360bbb-3(b)(1), unless the authorization is terminated or revoked.     Resp Syncytial Virus by PCR NEGATIVE NEGATIVE Final    Comment: (NOTE) Fact Sheet for Patients: BloggerCourse.com  Fact Sheet for Healthcare Providers: SeriousBroker.it  This test is not yet approved or cleared by the [place] FDA and has been authorized for detection and/or diagnosis of SARS-CoV-2 by FDA under an Emergency Use Authorization (EUA). This EUA will remain in effect (meaning this test can be used) for the duration of the COVID-19 declaration under Section 564(b)(1) of the Act, 21 U.S.C. section 360bbb-3(b)(1), unless the authorization is terminated or revoked.  Performed at [facility], [address] [place], [place] [address]   Blood culture (routine x 2)     Status: None (Preliminary result)   Collection Time: 11/04/22  1:20 PM   Specimen: BLOOD RIGHT HAND  Result Value Ref Range Status   Specimen Description   Final    BLOOD RIGHT HAND Performed at [facility], [address] [place], [place] [address]    Special Requests   Final    BOTTLES DRAWN AEROBIC AND ANAEROBIC Blood Culture adequate volume Performed at [facility], [address] [place], [place] [address]    Culture   Final    NO GROWTH 3 DAYS Performed at [facility], [address] [place], [place] [address]    Report Status PENDING  Incomplete  Blood culture (routine x 2)     Status: None (Preliminary result)   Collection Time: 11/04/22  1:40 PM   Specimen: BLOOD  Result Value Ref Range Status   Specimen Description   Final    BLOOD LEFT ANTECUBITAL Performed at [facility], [address] [place], [place] [address]    Special Requests   Final    Blood Culture adequate volume BOTTLES DRAWN AEROBIC AND ANAEROBIC Performed at [facility], [address] [place], [place] [address]    Culture   Final    NO GROWTH  3 DAYS Performed at [facility], [address], [place], [place] [address]    Report Status PENDING  Incomplete  Respiratory (~20 pathogens) panel by PCR     Status: None   Collection Time: 11/05/22 10:36 AM   Specimen: Nasopharyngeal Swab; Respiratory  Result Value Ref Range Status   Adenovirus NOT DETECTED NOT DETECTED Final   Coronavirus 229E NOT DETECTED NOT DETECTED Final    Comment: (NOTE) The Coronavirus on the Respiratory Panel, DOES NOT test for the novel  Coronavirus (2019 nCoV)    Coronavirus HKU1 NOT DETECTED NOT DETECTED Final   Coronavirus NL63 NOT DETECTED NOT DETECTED Final   Coronavirus OC43 NOT DETECTED NOT DETECTED Final   Metapneumovirus NOT DETECTED NOT DETECTED Final   Rhinovirus / Enterovirus NOT DETECTED NOT DETECTED Final   Influenza A NOT DETECTED NOT DETECTED Final   Influenza B NOT DETECTED NOT DETECTED Final   Parainfluenza Virus 1 NOT DETECTED NOT DETECTED Final   Parainfluenza Virus 2 NOT DETECTED NOT DETECTED Final   Parainfluenza Virus 3 NOT DETECTED NOT DETECTED Final   Parainfluenza Virus 4 NOT DETECTED NOT DETECTED Final   Respiratory Syncytial Virus NOT DETECTED NOT DETECTED Final   Bordetella pertussis NOT DETECTED NOT DETECTED Final   Bordetella Parapertussis NOT DETECTED NOT DETECTED Final   Chlamydophila pneumoniae NOT DETECTED NOT  DETECTED Final   Mycoplasma pneumoniae NOT DETECTED NOT DETECTED Final    Comment: Performed at [facility], [address] [place], [place] [address]  Body fluid culture w Gram Stain     Status: None (Preliminary result)   Collection Time: 11/05/22  1:00 PM   Specimen: Pleural Fluid  Result Value Ref Range Status   Specimen Description   Final    PLEURAL Performed at [facility], [address], [place], [place] [address]    Special Requests   Final    NONE Performed at [facility], [address], [place], [place] [address]    Gram Stain   Final    FEW WBC PRESENT, PREDOMINANTLY PMN NO ORGANISMS SEEN    Culture   Final    NO GROWTH 2 DAYS Performed at [facility], [address] [place], [place] [address]    Report Status PENDING  Incomplete    Radiology Studies: DG CHEST PORT 1 VIEW  Result Date: 11/07/2022 CLINICAL DATA:  Pleural effusion on left. EXAM: PORTABLE CHEST 1 VIEW COMPARISON:  AP chest 11/06/2022, CT chest 11/04/2022 FINDINGS: Redemonstration of left basilar pigtail chest tube. Mild interval improvement in now mild-to-moderate left pleural effusion, previously extending 60% up the left hemithorax and now extending 30-40% up the left hemithorax. Associated left midlung linear densities. Minimal right costophrenic angle linear subsegmental atelectasis. No pneumothorax. Visualized cardiac silhouette and mediastinal contours are within normal limits. No acute skeletal abnormality. IMPRESSION: Redemonstration of left basilar pigtail chest tube. Mild interval improvement in now mild-to-moderate left pleural effusion Electronically Signed   By: Neita Garnet M.D.   On: 11/07/2022 08:29   DG CHEST PORT 1 VIEW  Result Date: 11/06/2022 CLINICAL DATA:  Chest tube placement.  [id number] EXAM: PORTABLE CHEST 1 VIEW COMPARISON:  11/06/2022 at 4:18 a.m. FINDINGS: There is a large left pleural effusion with basilar atelectasis or  consolidation. Similar appearance to prior study. A left chest tube has been placed in the lower chest region. The right lung is clear. Heart size is obscured by the left pleural/parenchymal process. No pneumothorax.  IMPRESSION: Large left pleural effusion with infiltration or atelectasis in the left base. Interval placement of a left chest tube. No pneumothorax. Electronically Signed   By: Burman Nieves M.D.   On: 11/06/2022 17:49   DG CHEST PORT 1 VIEW  Result Date: 11/06/2022 CLINICAL DATA:  Follow-up pleural effusion. EXAM: PORTABLE CHEST 1 VIEW COMPARISON:  11/05/22 FINDINGS: Stable cardiomediastinal contours. There is a large left pleural effusion which appears unchanged in volume from the previous exam. Right lung appears clear. No new findings. IMPRESSION: No change in large left pleural effusion. Electronically Signed   By: Signa Kell M.D.   On: 11/06/2022 08:15    Scheduled Meds:  acetaminophen  650 mg Oral Q8H   amoxicillin-clavulanate  1 tablet Oral Q12H   guaiFENesin  1,200 mg Oral BID   ibuprofen  600 mg Oral Q6H   lidocaine  20 mL Intradermal Once   loratadine  5 mg Oral Daily   sodium chloride flush  10 mL Intrapleural Q8H   Continuous Infusions:  sodium chloride Stopped (11/04/22 1705)    LOS: 3 days   Pleas Koch, MD [facility] 2:50 PM  [facility] Available via Epic secure chat 7am-7pm After these hours, please refer to coverage provider listed on [email or web address] 11/07/2022, 2:43 PM

## 2022-11-07 NOTE — Progress Notes (Addendum)
PROGRESS NOTE (NOT CONSULT)     NAME:  Melissa Pineda, MRN:  [id number], DOB:  11-04-1985, LOS: 3 ADMISSION DATE:  11/04/2022, CONSULTATION DATE:  1/16 REFERRING MD: Dr. Marland Mcalpine CHIEF COMPLAINT:  pneumonia and pleural effusion   History of Present Illness:  37 y/o F with PMH significant for anxiety who presented to the ED 1/15 with worsening shortness of breath and left flank pain.  She had been seen previously in the ED on 1/10 with cough for one week and was diagnosed with pneumonia, covid and flu were negative.  She was treated with doxycycline and albuterol and discharged home,  however symptoms worsened so she returned.    CXR showed L lingular pneumonia with near complete opacification and CT chest with large left pleural effusion.   Labs showed a WBC of 16k and strep pneumo was negative.   She was admitted to the hospitalists and treated with Rocephin and Azithromycin and has not required [place] oxygen.  PCCM consulted for pleural effusion.   She reports that her symptoms initially began approximately two months ago with a mild cold, however her dry cough persisted.  She initially went to urgent care and was given sudafed, then flew to [place] for Christmas and was seen again for cough.  States that her CXR was reportedly clear, so was treated with steroids.  She has had night sweats and chills, no measured fevers or weight loss.   Does not smoke or vape, no recent travel, occasionally smokes marijuana, but no other drug use, no regular alcohol use.   Pertinent  Medical History   has a past medical history of Anxiety.   Significant Hospital Events: Including procedures, antibiotic start and stop dates in addition to other pertinent events   1/15 presented to the ED with worsening L-sided pneumonia that failed outpatient treatment  1/16 PCCM consult, thoracentesis 1/17 Small bore chest tube insertion, 2L drained, [place] changed out overnight   Interim History / Subjective:  2L output from chest tube >  s[place]ara changed out over night  Afebrile  Pt reports feeling better, pain under control with ibuprofen   Objective   Blood pressure 117/76, pulse (!) 109, temperature 98.2 F (36.8 C), temperature source Oral, resp. rate 18, height 5\' 6"  (1.676 m), weight 62.1 kg, last menstrual period 10/14/2022, SpO2 98 %, unknown if currently breastfeeding.        Intake/Output Summary (Last 24 hours) at 11/07/2022 0731 Last data filed at 11/07/2022 11/09/2022 Gross per 24 hour  Intake 220 ml  Output 1550 ml  Net -1330 ml   Filed Weights   11/05/22 1721  Weight: 62.1 kg   Exam: General: adult female lying in bed in NAD, mother at bedside  HEENT: MM pink/moist, good dentition, anicteric Neuro: AAOx4, speech clear, MAE  CV: s1s2 RRR, no m/r/g PULM: non-labored at rest on RA, clear on right, diminished LLL but improved air entry on left compared to prior exam  GI: soft, bsx4 active  Extremities: warm/dry, no edema  Skin: no rashes or lesions  Resolved Hospital Problem list     Assessment & Plan:   LLL PNA and Lymphocytic Predominant Pleural Effusion  Recent URI  Recent URI with failure of outpatient treatment with prednisone, cough suppression & subsequent doxycycline. COVID / Flu negative / RVP. S/p thoracentesis 1/16 with 1.5L volume removed. Subsequent re-accumulation on CXR 1/17.  Pleural fluid with LDH 583, glucose 76, TNC 1,994. Exudative by LDH (ratio 2.1).    -encouraged pulmonary hygiene -  mobilize, flutter  -continue Augmentin, would recommend 7-10 abx after chest tube out / source control achieved  -follow up pleural studies, cytology  -continue scheduled tylenol, ibuprofen  -plan for repeat CT once pleural space completely evacuated prior to chest tube removal  -follow CXR  -chest tube protocol in place, reviewed flushing CT with RN  -discussed care post chest tube removal, activity level and work  -pt will need work note  -follow up with Pulmonary in office post discharge / ~2  weeks for repeat CXR, will arrange   Best Practice (right click and "Reselect all SmartList Selections" daily)  Per primary      Canary Brim, MSN, APRN, NP-C, AGACNP-BC [facility] 11/07/2022, 7:31 AM   Please see [email or web address] for pager details.   From 7A-7P if no response, please call [phone] After hours, please call ELink [phone]

## 2022-11-07 NOTE — Consult Note (Signed)
[facility] Telephone:([phone]) [phone]   Fax:([phone]) [phone]  CONSULT NOTE  REFERRING PHYSICIAN: Dr. Pleas Koch  REASON FOR CONSULTATION:  37 years old white female diagnosed with lung cancer.  HPI Melissa Pineda is a 37 y.o. female never smoker with no significant past medical history except for some anxiety issues.  The patient mentioned that for the last 2 months she has been complaining of persistent cough after cold symptoms that did not disappear.  She was seen few times at some of the urgent care facilities and treated with courses of antibiotics with no improvement in her condition.  Her dyspnea and cough was progressive and she presented to the emergency department at the med center [facility] for evaluation.  Chest x-ray on November 04, 2022 showed significant interval increase in size of left pleural effusion, moderate to large.  Underlying atelectasis and/or airspace consolidation within the mid and lower left lung has also significantly progressed from the previous exam 5 days before.  The patient had CT angiogram of the chest on the same day and that showed no evidence for acute pulmonary embolus but there was near complete atelectasis of the lingula and left lower lobe with heterogeneous consolidation resulting in marked narrowing of the left-sided airways suspicious for pneumonia but there was large left pleural effusion and multistation mediastinal lymphadenopathy that were reported to be reactive but this is included a 1.3 cm left internal mammary lymph node, 2.0 cm subcarinal lymph node and 1.4 cm prevascular lymph node.  The patient was transferred to [facility] and seen by pulmonary medicine.  She underwent pigtail pleural catheter placement with drainage of large amount of left-sided pleural effusion.  Unfortunately the cytology ([id number] ) showed malignant cells present consistent with adenocarcinoma of lung primary.  Six immunohistochemical  stains were performed with adequate control to elucidate the histogenesis of these neoplastic cells.  The tumor cells are positive for cytokeratin 7 and the pulmonary adeno marker TTF-1.  They show focal weak positivity for PAX8.  The cells are negative for the breast marker GATA3 and the GI markers cytokeratin 20 and CDX2.  This cytohistomorphology and immunohistochemical pattern support the above diagnosis.  I was consulted to see the patient today for evaluation and recommendation regarding her condition. When seen today she is very anxious about her recent diagnosis and tearful at times.  She was shocked with the diagnosis.  Her mother and husband were at the bedside.  Her cousin [name] who is the ED physician was also available by phone during the visit.  She continues to have mild shortness of breath with cough and no hemoptysis.  She has no nausea, vomiting, diarrhea or constipation.  She has no headache or visual changes.  She has no recent weight loss or night sweats. Family history significant for paternal grandmother, grandfather as well as maternal aunt with lung cancer but all of them were smokers. The patient is married and has 1 son who is 35 years old.  She works for a [employer], [employer].  She has no history for smoking except for occasional marijuana but no history of alcohol abuse or cigarette smoking.  HPI  Past Medical History:  Diagnosis Date   Anxiety    Phreesia 11/21/2020    Past Surgical History:  Procedure Laterality Date   WISDOM TOOTH EXTRACTION      Family History  Problem Relation Age of Onset   Cancer Mother    Cancer Maternal Grandmother  Cancer Maternal Grandfather    Hypertension Paternal Grandmother    Cancer Paternal Grandfather     Social History Social History   Tobacco Use   Smoking status: Never   Smokeless tobacco: Never  Vaping Use   Vaping Use: Never used  Substance Use Topics   Alcohol use: Yes     Alcohol/week: 7.0 standard drinks of alcohol    Types: 7 Glasses of wine per week    Comment: 1 glass wine in the evening    Drug use: Yes    Types: Marijuana    Comment: occasional    No Known Allergies  Current Facility-Administered Medications  Medication Dose Route Frequency Provider Last Rate Last Admin   0.9 %  sodium chloride infusion   Intravenous PRN Melene Plan, DO   Stopped at 11/04/22 1705   acetaminophen (TYLENOL) tablet 650 mg  650 mg Oral Q6H PRN Marguerita Merles Latif, DO   650 mg at 11/05/22 1341   acetaminophen (TYLENOL) tablet 650 mg  650 mg Oral Q8H Ollis, Brandi L, NP   650 mg at 11/06/22 1730   albuterol (PROVENTIL) (2.5 MG/3ML) 0.083% nebulizer solution 2.5 mg  2.5 mg Nebulization Q6H PRN Bobette Mo, MD       ALPRAZolam Prudy Feeler) tablet 0.5 mg  0.5 mg Oral TID PRN Rhetta Mura, MD       amoxicillin-clavulanate (AUGMENTIN) 875-125 MG per tablet 1 tablet  1 tablet Oral Q12H Rhetta Mura, MD   1 tablet at 11/07/22 0921   guaiFENesin (MUCINEX) 12 hr tablet 1,200 mg  1,200 mg Oral BID Sheikh, Omair Latif, DO       ibuprofen (ADVIL) tablet 600 mg  600 mg Oral Q6H Anthoney Harada, NP   600 mg at 11/07/22 0920   lidocaine (XYLOCAINE) 1 % (with pres) injection 20 mL  20 mL Intradermal Once Canary Brim L, NP       loratadine (CLARITIN) tablet 5 mg  5 mg Oral Daily Rhetta Mura, MD   5 mg at 11/07/22 8168   Oral care mouth rinse  15 mL Mouth Rinse PRN Bobette Mo, MD       sodium chloride flush (NS) 0.9 % injection 10 mL  10 mL Intrapleural Q8H Ollis, Brandi L, NP   10 mL at 11/07/22 0720    Review of Systems  Constitutional: positive for fatigue Eyes: negative Ears, nose, mouth, throat, and face: negative Respiratory: positive for cough and dyspnea on exertion Cardiovascular: negative Gastrointestinal: negative Genitourinary:negative Integument/breast: negative Hematologic/lymphatic:  negative Musculoskeletal:negative Neurological: negative Behavioral/Psych: positive for anxiety Endocrine: negative Allergic/Immunologic: negative  Physical Exam  HAZ:CUNMM, healthy, no distress, well nourished, well developed, and anxious SKIN: skin color, texture, turgor are normal, no rashes or significant lesions HEAD: Normocephalic, No masses, lesions, tenderness or abnormalities EYES: normal, PERRLA, Conjunctiva are pink and non-injected EARS: External ears normal, Canals clear OROPHARYNX:no exudate and no erythema  NECK: supple, no adenopathy, no JVD LYMPH:  no palpable lymphadenopathy, no hepatosplenomegaly BREAST:not examined LUNGS: coarse sounds heard, decreased breath sounds HEART: regular rate & rhythm, no murmurs, and no gallops ABDOMEN:abdomen soft, non-tender, normal bowel sounds, and no masses or organomegaly BACK: Back symmetric, no curvature., No CVA tenderness EXTREMITIES:no joint deformities, effusion, or inflammation, no edema  NEURO: alert & oriented x 3 with fluent speech, no focal motor/sensory deficits  PERFORMANCE STATUS: ECOG 0  LABORATORY DATA: Lab Results  Component Value Date   WBC 15.0 (H) 11/07/2022   HGB 12.8 11/07/2022   HCT  39.6 11/07/2022   MCV 89.6 11/07/2022   PLT 323 11/07/2022    @LASTCHEM @  RADIOGRAPHIC STUDIES: DG CHEST PORT 1 VIEW  Result Date: 11/07/2022 CLINICAL DATA:  Pleural effusion on left. EXAM: PORTABLE CHEST 1 VIEW COMPARISON:  AP chest 11/06/2022, CT chest 11/04/2022 FINDINGS: Redemonstration of left basilar pigtail chest tube. Mild interval improvement in now mild-to-moderate left pleural effusion, previously extending 60% up the left hemithorax and now extending 30-40% up the left hemithorax. Associated left midlung linear densities. Minimal right costophrenic angle linear subsegmental atelectasis. No pneumothorax. Visualized cardiac silhouette and mediastinal contours are within normal limits. No acute skeletal  abnormality. IMPRESSION: Redemonstration of left basilar pigtail chest tube. Mild interval improvement in now mild-to-moderate left pleural effusion Electronically Signed   By: Yvonne Kendall M.D.   On: 11/07/2022 08:29   DG CHEST PORT 1 VIEW  Result Date: 11/06/2022 CLINICAL DATA:  Chest tube placement.  [id number] EXAM: PORTABLE CHEST 1 VIEW COMPARISON:  11/06/2022 at 4:18 a.m. FINDINGS: There is a large left pleural effusion with basilar atelectasis or consolidation. Similar appearance to prior study. A left chest tube has been placed in the lower chest region. The right lung is clear. Heart size is obscured by the left pleural/parenchymal process. No pneumothorax. IMPRESSION: Large left pleural effusion with infiltration or atelectasis in the left base. Interval placement of a left chest tube. No pneumothorax. Electronically Signed   By: Lucienne Capers M.D.   On: 11/06/2022 17:49   DG CHEST PORT 1 VIEW  Result Date: 11/06/2022 CLINICAL DATA:  Follow-up pleural effusion. EXAM: PORTABLE CHEST 1 VIEW COMPARISON:  11/05/22 FINDINGS: Stable cardiomediastinal contours. There is a large left pleural effusion which appears unchanged in volume from the previous exam. Right lung appears clear. No new findings. IMPRESSION: No change in large left pleural effusion. Electronically Signed   By: Kerby Moors M.D.   On: 11/06/2022 08:15   DG CHEST PORT 1 VIEW  Result Date: 11/05/2022 CLINICAL DATA:  Post thoracentesis. EXAM: PORTABLE CHEST 1 VIEW COMPARISON:  Chest x-ray from earlier same day. FINDINGS: Decreased size of the LEFT pleural effusion status post thoracentesis. Large opacity persists at the LEFT lung base, likely residual pleural effusion and/or atelectasis/airspace collapse. RIGHT lung is clear. No pneumothorax is seen. Heart size and mediastinal contours are grossly stable. IMPRESSION: 1. Decreased size of the LEFT pleural effusion status post thoracentesis. No pneumothorax. 2. Large opacity persists at  the LEFT lung base, likely residual pleural effusion and/or atelectasis/airspace collapse. Electronically Signed   By: Franki Cabot M.D.   On: 11/05/2022 13:24   DG CHEST PORT 1 VIEW  Result Date: 11/05/2022 CLINICAL DATA:  Short of breath EXAM: PORTABLE CHEST 1 VIEW COMPARISON:  11/04/2022 FINDINGS: LEFT cardiac silhouette is obscured large LEFT effusion. LEFT pleural effusion occupies 75% of LEFT hemithorax and is increased comparison exam. RIGHT lung clear. No pneumothorax. IMPRESSION: Increasing large LEFT effusion occupying 75% of the LEFT hemithorax. Electronically Signed   By: Suzy Bouchard M.D.   On: 11/05/2022 10:14   CT Angio Chest PE W and/or Wo Contrast  Result Date: 11/04/2022 CLINICAL DATA:  Pneumonia and worsening shortness of breath EXAM: CT ANGIOGRAPHY CHEST WITH CONTRAST TECHNIQUE: Multidetector CT imaging of the chest was performed using the standard protocol during bolus administration of intravenous contrast. Multiplanar CT image reconstructions and MIPs were obtained to evaluate the vascular anatomy. RADIATION DOSE REDUCTION: This exam was performed according to the departmental dose-optimization program which includes automated exposure control, adjustment of  the mA and/or kV according to patient size and/or use of iterative reconstruction technique. CONTRAST:  41mL OMNIPAQUE IOHEXOL 350 MG/ML SOLN COMPARISON:  Chest radiograph dated 10/30/2022 FINDINGS: Cardiovascular: The study is high quality for the evaluation of pulmonary embolism. There are no filling defects in the central, lobar, segmental or subsegmental pulmonary artery branches to suggest acute pulmonary embolism. Great vessels are normal in course and caliber. Normal heart size. No significant pericardial fluid/thickening. Mediastinum/Nodes: Imaged thyroid gland without nodules meeting criteria for imaging follow-up by size. Rightward shift of the mediastinum. Normal esophagus. Multi station lymphadenopathy including  1.3 cm left internal mammary lymph node(5:98), 2.0 cm subcarinal lymph node (4:59), and 1.4 cm prevascular lymph node (4:51). Lungs/Pleura: The central airways are patent. Near-complete atelectasis of the lingula and left lower lobe with heterogeneous consolidation resulting in marked narrowing of the left sided airways. Dependent ground-glass opacity in the left upper lobe. No pneumothorax. Large left pleural effusion. Upper abdomen: Normal. Musculoskeletal: No acute or abnormal lytic or blastic osseous lesions. Review of the MIP images confirms the above findings. IMPRESSION: 1. No acute pulmonary embolism. 2. Near-complete atelectasis of the lingula and left lower lobe with heterogeneous consolidation resulting in marked narrowing of the left sided airways, likely multifocal pneumonia. 3. Large left pleural effusion. 4. Multi station mediastinal lymphadenopathy likely reactive Electronically Signed   By: Agustin Cree M.D.   On: 11/04/2022 14:51   DG Chest Port 1 View  Result Date: 11/04/2022 CLINICAL DATA:  Provided history: Left chest pain. EXAM: PORTABLE CHEST 1 VIEW COMPARISON:  Prior chest radiograph 10/30/2022. FINDINGS: Significant interval increase in size of a left pleural effusion, now moderate-to-large. Underlying atelectasis and/or airspace consolidation within the mid and lower left lung, also significantly progressed. No appreciable airspace consolidation on the right. Portions of the cardiac silhouette are obscured on the left, limiting evaluation of heart size. No evidence of pneumothorax. No acute bony abnormality identified. IMPRESSION: Significant interval increase in size of a left pleural effusion, now moderate-to-large. Underlying atelectasis and/or airspace consolidation within the mid and lower left lung has also significantly progressed from the prior examination of 10/30/2022. Electronically Signed   By: Jackey Loge D.O.   On: 11/04/2022 12:22   DG Chest Portable 1 View  Result  Date: 10/30/2022 CLINICAL DATA:  Cough, headache, fatigue, night sweats. EXAM: PORTABLE CHEST 1 VIEW COMPARISON:  Overlapping portions CT abdomen 10/09/2011 FINDINGS: Left lower lobe and possible lingular consolidation. Pleural thickening along the left lateral costophrenic angle may reflect adjacent pleural effusion. The right lung appears clear. Cardiac and mediastinal margins appear normal. IMPRESSION: 1. Left lower lobe and possible lingular consolidation with small left pleural effusion. Findings favor left basilar pneumonia over atelectasis. Followup PA and lateral chest X-ray is recommended in 3-4 weeks following trial of antibiotic therapy to ensure resolution and exclude the unlikely possibility of underlying malignancy. Electronically Signed   By: Gaylyn Rong M.D.   On: 10/30/2022 10:54    ASSESSMENT: This is a very pleasant never smoker 37 years old white female with unfortunate diagnosis of stage IVA non-small cell lung cancer with atelectasis of the lingula as well as the left lower lobe with large malignant left pleural effusion and suspicious mediastinal lymphadenopathy diagnosed in January 2024.    PLAN: I had a lengthy discussion with the patient and her family today about her current condition and further investigation to complete the staging workup as well as possible treatment options. I recommended for the patient to have repeat CT  scan of the chest, abdomen and pelvis as well as MRI of the brain during her hospitalization. I will arrange for the patient to have a PET scan on outpatient basis after discharge. The patient is a never smoker and there is high probability of underlying actionable driver mutation.  I requested the blood test to be sent to [facility] today for molecular studies. I will also request her tissue sample from the cytology of the pleural fluid to [facility] for molecular studies and PD-L1 expression. For the recurrent left pleural effusion,  she is currently has a pigtail Pleurx catheter placed and she will be followed by pulmonary medicine for drainage and education about drainage at home after discharge. Once the patient is discharged from the hospital, I will arrange for her to have the PET scan and I will also arrange for her follow-up appointment with me immediately after the availability of the molecular studies to discuss her treatment in more details. The patient was tearful and anxious during this visit but she started accepting this sad reality and she is interested in fighting this cancer to live longer for herself and her son.  She understand that this is not a curable condition but there are a lot of options to help her to live longer and live better. I will continue to monitor her condition during her hospitalization and will update the patient with any new findings from the imaging studies or molecular studies.  The patient voices understanding of current disease status and treatment options and is in agreement with the current care plan.  All questions were answered. The patient knows to call the clinic with any problems, questions or concerns. We can certainly see the patient much sooner if necessary.  Thank you so much for allowing me to participate in the care of Jeriah Grandberry. I will continue to follow up the patient with you and assist in her care.  The total time spent in the appointment was 90 minutes.  Disclaimer: This note was dictated with voice recognition software. Similar sounding words can inadvertently be transcribed and may not be corrected upon review.   Lajuana Matte November 07, 2022, 1:46 PM

## 2022-11-08 ENCOUNTER — Inpatient Hospital Stay (HOSPITAL_COMMUNITY): Payer: BC Managed Care – PPO

## 2022-11-08 ENCOUNTER — Telehealth: Payer: Self-pay | Admitting: Pulmonary Disease

## 2022-11-08 DIAGNOSIS — J90 Pleural effusion, not elsewhere classified: Secondary | ICD-10-CM | POA: Diagnosis not present

## 2022-11-08 DIAGNOSIS — J189 Pneumonia, unspecified organism: Secondary | ICD-10-CM | POA: Diagnosis not present

## 2022-11-08 LAB — CBC
HCT: 38.6 % (ref 36.0–46.0)
Hemoglobin: 12.5 g/dL (ref 12.0–15.0)
MCH: 28.9 pg (ref 26.0–34.0)
MCHC: 32.4 g/dL (ref 30.0–36.0)
MCV: 89.4 fL (ref 80.0–100.0)
Platelets: 364 10*3/uL (ref 150–400)
RBC: 4.32 MIL/uL (ref 3.87–5.11)
RDW: 12.1 % (ref 11.5–15.5)
WBC: 14.1 10*3/uL — ABNORMAL HIGH (ref 4.0–10.5)
nRBC: 0 % (ref 0.0–0.2)

## 2022-11-08 LAB — COMPREHENSIVE METABOLIC PANEL
ALT: 12 U/L (ref 0–44)
AST: 11 U/L — ABNORMAL LOW (ref 15–41)
Albumin: 2.6 g/dL — ABNORMAL LOW (ref 3.5–5.0)
Alkaline Phosphatase: 50 U/L (ref 38–126)
Anion gap: 11 (ref 5–15)
BUN: <5 mg/dL — ABNORMAL LOW (ref 6–20)
CO2: 25 mmol/L (ref 22–32)
Calcium: 8.2 mg/dL — ABNORMAL LOW (ref 8.9–10.3)
Chloride: 103 mmol/L (ref 98–111)
Creatinine, Ser: 0.5 mg/dL (ref 0.44–1.00)
GFR, Estimated: >60 mL/min (ref >60)
Glucose, Bld: 104 mg/dL — ABNORMAL HIGH (ref 70–99)
Potassium: 3.9 mmol/L (ref 3.5–5.1)
Sodium: 139 mmol/L (ref 135–145)
Total Bilirubin: 0.7 mg/dL (ref 0.3–1.2)
Total Protein: 6 g/dL — ABNORMAL LOW (ref 6.5–8.1)

## 2022-11-08 LAB — CYTOLOGY - NON PAP

## 2022-11-08 MED ORDER — IBUPROFEN 200 MG PO TABS
600.0000 mg | ORAL_TABLET | Freq: Four times a day (QID) | ORAL | Status: DC
  Administered 2022-11-08: 600 mg via ORAL
  Filled 2022-11-08 (×2): qty 3

## 2022-11-08 MED ORDER — ESCITALOPRAM OXALATE 10 MG PO TABS
10.0000 mg | ORAL_TABLET | Freq: Every day | ORAL | Status: DC
  Administered 2022-11-08 – 2022-11-09 (×2): 10 mg via ORAL
  Filled 2022-11-08 (×2): qty 1

## 2022-11-08 MED ORDER — IBUPROFEN 200 MG PO TABS
400.0000 mg | ORAL_TABLET | Freq: Four times a day (QID) | ORAL | Status: DC
  Administered 2022-11-08 – 2022-11-09 (×5): 400 mg via ORAL
  Filled 2022-11-08 (×5): qty 2

## 2022-11-08 MED ORDER — ALBUTEROL SULFATE HFA 108 (90 BASE) MCG/ACT IN AERS: 1.0000 | INHALATION_SPRAY | RESPIRATORY_TRACT | Status: DC | PRN

## 2022-11-08 NOTE — Plan of Care (Signed)

## 2022-11-08 NOTE — Progress Notes (Addendum)
PROGRESS NOTE (NOT CONSULT)     NAME:  Melissa Pineda, MRN:  [id number], DOB:  1986-09-16, LOS: 4 ADMISSION DATE:  11/04/2022, CONSULTATION DATE:  1/16 REFERRING MD: Dr. Marland Mcalpine CHIEF COMPLAINT:  pneumonia and pleural effusion   History of Present Illness:  37 y/o F with PMH significant for anxiety who presented to the ED 1/15 with worsening shortness of breath and left flank pain.  She had been seen previously in the ED on 1/10 with cough for one week and was diagnosed with pneumonia, covid and flu were negative.  She was treated with doxycycline and albuterol and discharged home,  however symptoms worsened so she returned.    CXR showed L lingular pneumonia with near complete opacification and CT chest with large left pleural effusion.   Labs showed a WBC of 16k and strep pneumo was negative.   She was admitted to the hospitalists and treated with Rocephin and Azithromycin and has not required [place] oxygen.  PCCM consulted for pleural effusion.   She reports that her symptoms initially began approximately two months ago with a mild cold, however her dry cough persisted.  She initially went to urgent care and was given sudafed, then flew to [place] for Christmas and was seen again for cough.  States that her CXR was reportedly clear, so was treated with steroids.  She has had night sweats and chills, no measured fevers or weight loss.   Does not smoke or vape, no recent travel, occasionally smokes marijuana, but no other drug use, no regular alcohol use.   Pertinent  Medical History   has a past medical history of Anxiety.   Significant Hospital Events: Including procedures, antibiotic start and stop dates in addition to other pertinent events   1/15 presented to the ED with worsening L-sided pneumonia that failed outpatient treatment  1/16 PCCM consult, thoracentesis 1/17 Small bore chest tube insertion, 2L drained, [place] changed out overnight   Interim History / Subjective:  110 ml out of chest tube  overnight  Afebrile  Hoping chest tube can be removed   Objective   Blood pressure 118/73, pulse (!) 109, temperature 98.2 F (36.8 C), temperature source Oral, resp. rate 20, height 5\' 6"  (1.676 m), weight 62.1 kg, last menstrual period 10/14/2022, SpO2 99 %, unknown if currently breastfeeding.        Intake/Output Summary (Last 24 hours) at 11/08/2022 1106 Last data filed at 11/08/2022 0500 Gross per 24 hour  Intake 510 ml  Output 110 ml  Net 400 ml   Filed Weights   11/05/22 1721  Weight: 62.1 kg   Exam: General: young adult female lying in bed in NAD, mother at bedside  HEENT: MM pink/moist, anicteric, good dentition  Neuro: AAOx4, speech clear, MAE CV: s1s2 RRR, no m/r/g PULM: non-labored at rest, clear on right, diminished on left. Left chest tube discontinued at bedside, occlusive dressing applied.  GI: soft, bsx4 active  Extremities: warm/dry, no edema  Skin: no rashes or lesions  Resolved Hospital Problem list     Assessment & Plan:   LLL PNA and Lymphocytic Predominant Pleural Effusion  Stage IVa NON-Small Cell Lung Cancer  Recent URI with failure of outpatient treatment with prednisone, cough suppression & subsequent doxycycline. COVID / Flu negative / RVP. S/p thoracentesis 1/16 with 1.5L volume removed. Subsequent re-accumulation on CXR 1/17.  Pleural fluid with LDH 583, glucose 76, TNC 1,994. Exudative by LDH (ratio 2.1).  S/p chest tube placement.  Cytology consistent with adenocarcinoma consistent  with lung primary.   -appreciate Dr. Arbutus Ped  -reasonable to stop abx  -follow up molecular studies  -continue tylenol / ibuprofen for pain for total 4 doses  -minimal drainage from chest tube, discontinued -pt will need work note, FMLA completed  -follow up CXR in am  -follow up with Pulmonary as outpatient if re-accumulation of pleural fluid. May need PleurX catheter, this was discussed with patient & family  Situational Anxiety  -add lexapro, reviewed with  pharmacy  -may need PRN xanax at discharge    Likely could discharge from pulmonary standpoint in am pending CXR review.   Best Practice (right click and "Reselect all SmartList Selections" daily)  Per primary      Canary Brim, MSN, APRN, NP-C, AGACNP-BC [facility] 11/08/2022, 11:06 AM   Please see [email or web address] for pager details.   From 7A-7P if no response, please call [phone] After hours, please call ELink [phone]

## 2022-11-08 NOTE — Progress Notes (Signed)
PROGRESS NOTE   Melissa Pineda  AYO:[id number] DOB: 1986-03-01 DOA: 11/04/2022 PCP: Patient, No Pcp Per   Brief Narrative:  37 year old Caucasian known anxiety She first developed this 8 weeks prior, went to urgent care, received steroids 12/29 at [facility], then when she stopped the steroids, and felt worse , was diagnosed with a pneumonia 10/30/22 in ER and was discharged home on doxycycline and an albuterol inhaler.    returned to the ED 1/15 with worsening shortness of breath, cough, as well as fever.  +  Pleuritic chest pain that was 9 out of 10 in severity and left-sided in the ED  found to have a lingular pneumonia with near complete opacification of the area with a large left-sided pleural effusion.   also reactive lymphadenopathy and she is admitted to the hospital with community-acquired pneumonia and failed outpatient treatment as well as a left-sided pleural effusion.    Chest x-ray --worsening of her pleural effusion   1/16 pulmonary was consulted and they did a bedside thoracentesis which drained 1.9 L of blood-tinged cloudy fluid.  1/17 pigtail catheter placed after rapid reaccumulation of fluid 1/18 pathology shows adenocarcinoma lung oncology consulted 1/19 chest tube removed  Assessment and Plan: No notes have been filed under this hospital service. Service: Hospitalist  Likely stage IV adenocarcinoma lung -Initial thought process was that patient had an infectious etiology but with rapid recurrence, lymphocytic predominance, and now positive cytology this is unlikely -d/w family 1/18 , with oncologist in the room Dr. 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 1/20 hope fluid does not reaccumulate and can send home  Anxiety Disorder -Not on  any Meds PTA -Had to be given 1 mg of Alprazolam po x1, started Xanax 0.5 3 times daily and adjust  -Lexapro has been given and will follow as an outpatient  Hyponatremia -resolved  Leukocytosis -Probably related to underlying malignancy-would not further check and has received 1 to 2 days of therapy  Normocytic Anemia, iron level 22 saturations 8 -Treat with iron as an outpatient -Start iron on discharge  DVT prophylaxis: SCDs Start: 11/04/22 2336    Code Status: Full Code Family Communication: discussed the case with her husband as well as mother at the bedside, aunt over the telephone 1/19  Disposition Plan:  Level of care: Med-Surg Status is: Inpatient Remains inpatient appropriate because:   Further chest tube planning and discussion about removal of the same    Consultants:  Pulmonary  Procedures:  As delineated above and she had a thoracentesis  Antimicrobials:     Subjective:  Overall much better Seems to be coping with diagnosis Some cough no fever no chills Some leakage of fluid around the site of the chest tube   Objective: Vitals:   11/06/22 2137 11/07/22 0619 11/07/22 2024 11/08/22 0808  BP: 111/75 117/76 103/73 118/73  Pulse: (!) 102 (!) 109 (!) 125 (!) 109  Resp: 20 18 16 20   Temp:  98.2 F (36.8 C) 98.8 F (37.1 C) 98.2 F (36.8 C)  TempSrc: Oral Oral Oral Oral  SpO2: 98% 98% 98% 99%  Weight:      Height:        Intake/Output Summary (Last 24 hours) at 11/08/2022 1232 Last data filed at 11/08/2022 0500 Gross per 24 hour  Intake 510 ml  Output 10 ml  Net 500 ml    Filed Weights   11/05/22 1721  Weight: 62.1 kg    Examination: Physical Exam:  Awake coherent no distress Decreased air entry left lower lung fields, examined area of bandage does not seem to develop S1-S2 no murmur Abdomen soft no rebound No lower extremity edema ROM intact   Data Reviewed: I have personally reviewed following labs and imaging  studies  CBC: Recent Labs  Lab 11/04/22 1153 11/05/22 0419 11/06/22 0432 11/07/22 0429 11/08/22 0428  WBC 16.6* 15.3* 15.3* 15.0* 14.1*  NEUTROABS 13.5*  --  11.3*  --   --   HGB 13.1 11.3* 11.4* 12.8 12.5  HCT 39.1 35.5* 34.7* 39.6 38.6  MCV 88.1 91.0 89.0 89.6 89.4  PLT 347 311 314 323 364    Basic Metabolic Panel: Recent Labs  Lab 11/04/22 1153 11/05/22 0419 11/05/22 0425 11/06/22 0432 11/07/22 0429 11/08/22 0428  NA 134* 136  --  136 136 139  K 3.7 3.7  --  3.7 4.3 3.9  CL 100 103  --  104 101 103  CO2 24 23  --  26 27 25   GLUCOSE 145* 142*  --  118* 106* 104*  BUN 7 5*  --  <5* <5* <5*  CREATININE 0.56 0.49  --  0.65 0.51 0.50  CALCIUM 9.0 8.1*  --  8.0* 8.2* 8.2*  MG 1.8  --  1.9 1.8  --   --   PHOS 2.1*  --  2.5 2.9  --   --     GFR: Estimated Creatinine Clearance: 91.9 mL/min (by C-G formula based on SCr of 0.5 mg/dL). Liver Function Tests: Recent Labs  Lab 11/04/22 1153 11/05/22 0419 11/06/22 0432 11/07/22 0429 11/08/22 0428  AST 10* 19 12* 12* 11*  ALT 8 14 14 13 12   ALKPHOS 60 56 50 49 50  BILITOT 1.0 0.5 0.7 0.7 0.7  PROT 7.0 6.1* 5.6* 5.8* 6.0*  ALBUMIN 3.8 3.1* 2.7* 2.6* 2.6*    Recent Labs  Lab 11/04/22 1153  LIPASE <10*    No results for input(s): "AMMONIA" in the last 168 hours. Coagulation Profile: Recent Labs  Lab 11/05/22 1110  INR 1.2    Cardiac Enzymes: No results for input(s): "CKTOTAL", "CKMB", "CKMBINDEX", "TROPONINI" in the last 168 hours. BNP (last 3 results) No results for input(s): "PROBNP" in the last 8760 hours. HbA1C: No results for input(s): "HGBA1C" in the last 72 hours. CBG: No results for input(s): "GLUCAP" in the last 168 hours. Lipid Profile: No results for input(s): "CHOL", "HDL", "LDLCALC", "TRIG", "CHOLHDL", "LDLDIRECT" in the last 72 hours. Thyroid Function Tests: No results for input(s): "TSH", "T4TOTAL", "FREET4", "T3FREE", "THYROIDAB" in the last 72 hours. Anemia Panel: Recent Labs     11/06/22 0432  VITAMINB12 201  FOLATE 11.6  FERRITIN 100  TIBC 272  IRON 22*  RETICCTPCT 1.1    Sepsis Labs: No results for input(s): "PROCALCITON", "LATICACIDVEN" in the last 168 hours.  Recent Results (from the past 240 hour(s))  Resp panel by RT-PCR (RSV, Flu A&B, Covid) Nasopharyngeal Swab     Status: None   Collection Time: 10/30/22 11:15 AM   Specimen: Nasopharyngeal Swab; Nasal Swab  Result Value Ref Range Status   SARS Coronavirus 2 by RT PCR NEGATIVE NEGATIVE Final    Comment: (NOTE) SARS-CoV-2 target nucleic acids are NOT DETECTED.  The SARS-CoV-2 RNA is generally detectable in upper respiratory specimens during the acute phase  of infection. The lowest concentration of SARS-CoV-2 viral copies this assay can detect is 138 copies/mL. A negative result does not preclude SARS-Cov-2 infection and should not be used as the sole basis for treatment or other patient management decisions. A negative result may occur with  improper specimen collection/handling, submission of specimen other than nasopharyngeal swab, presence of viral mutation(s) within the areas targeted by this assay, and inadequate number of viral copies(<138 copies/mL). A negative result must be combined with clinical observations, patient history, and epidemiological information. The expected result is Negative.  Fact Sheet for Patients:  BloggerCourse.com  Fact Sheet for Healthcare Providers:  SeriousBroker.it  This test is no t yet approved or cleared by the [place] FDA and  has been authorized for detection and/or diagnosis of SARS-CoV-2 by FDA under an Emergency Use Authorization (EUA). This EUA will remain  in effect (meaning this test can be used) for the duration of the COVID-19 declaration under Section 564(b)(1) of the Act, 21 U.S.C.section 360bbb-3(b)(1), unless the authorization is terminated  or revoked sooner.       Influenza A  by PCR NEGATIVE NEGATIVE Final   Influenza B by PCR NEGATIVE NEGATIVE Final    Comment: (NOTE) The Xpert Xpress SARS-CoV-2/FLU/RSV plus assay is intended as an aid in the diagnosis of influenza from Nasopharyngeal swab specimens and should not be used as a sole basis for treatment. Nasal washings and aspirates are unacceptable for Xpert Xpress SARS-CoV-2/FLU/RSV testing.  Fact Sheet for Patients: BloggerCourse.com  Fact Sheet for Healthcare Providers: SeriousBroker.it  This test is not yet approved or cleared by the [place] FDA and has been authorized for detection and/or diagnosis of SARS-CoV-2 by FDA under an Emergency Use Authorization (EUA). This EUA will remain in effect (meaning this test can be used) for the duration of the COVID-19 declaration under Section 564(b)(1) of the Act, 21 U.S.C. section 360bbb-3(b)(1), unless the authorization is terminated or revoked.     Resp Syncytial Virus by PCR NEGATIVE NEGATIVE Final    Comment: (NOTE) Fact Sheet for Patients: BloggerCourse.com  Fact Sheet for Healthcare Providers: SeriousBroker.it  This test is not yet approved or cleared by the [place] FDA and has been authorized for detection and/or diagnosis of SARS-CoV-2 by FDA under an Emergency Use Authorization (EUA). This EUA will remain in effect (meaning this test can be used) for the duration of the COVID-19 declaration under Section 564(b)(1) of the Act, 21 U.S.C. section 360bbb-3(b)(1), unless the authorization is terminated or revoked.  Performed at [facility], [address], [place], [place] [address]   Blood culture (routine x 2)     Status: None (Preliminary result)   Collection Time: 11/04/22  1:20 PM   Specimen: BLOOD RIGHT HAND  Result Value Ref Range Status   Specimen Description   Final    BLOOD RIGHT HAND Performed at [facility], [address] [place], [place] [address]    Special Requests   Final    BOTTLES DRAWN AEROBIC AND ANAEROBIC Blood Culture adequate volume Performed at [facility], [address] [place], [place] [address]    Culture   Final    NO GROWTH 4 DAYS Performed at [facility], [address] [place], [place] [address]    Report Status PENDING  Incomplete  Blood culture (routine x 2)     Status: None (Preliminary result)   Collection Time: 11/04/22  1:40 PM   Specimen: BLOOD  Result Value Ref  Range Status   Specimen Description   Final    BLOOD LEFT ANTECUBITAL Performed at [facility], [address] [place], [place] [address]    Special Requests   Final    Blood Culture adequate volume BOTTLES DRAWN AEROBIC AND ANAEROBIC Performed at [facility], [address], [place], [place] [address]    Culture   Final    NO GROWTH 4 DAYS Performed at [facility], [address] [place], [place] [address]    Report Status PENDING  Incomplete  Respiratory (~20 pathogens) panel by PCR     Status: None   Collection Time: 11/05/22 10:36 AM   Specimen: Nasopharyngeal Swab; Respiratory  Result Value Ref Range Status   Adenovirus NOT DETECTED NOT DETECTED Final   Coronavirus 229E NOT DETECTED NOT DETECTED Final    Comment: (NOTE) The Coronavirus on the Respiratory Panel, DOES NOT test for the novel  Coronavirus (2019 nCoV)    Coronavirus HKU1 NOT DETECTED NOT DETECTED Final   Coronavirus NL63 NOT DETECTED NOT DETECTED Final   Coronavirus OC43 NOT DETECTED NOT DETECTED Final   Metapneumovirus NOT DETECTED NOT DETECTED Final   Rhinovirus / Enterovirus NOT DETECTED NOT DETECTED Final   Influenza A NOT DETECTED NOT DETECTED Final   Influenza B NOT DETECTED NOT DETECTED Final   Parainfluenza Virus 1 NOT DETECTED NOT DETECTED Final   Parainfluenza Virus 2 NOT DETECTED NOT DETECTED Final    Parainfluenza Virus 3 NOT DETECTED NOT DETECTED Final   Parainfluenza Virus 4 NOT DETECTED NOT DETECTED Final   Respiratory Syncytial Virus NOT DETECTED NOT DETECTED Final   Bordetella pertussis NOT DETECTED NOT DETECTED Final   Bordetella Parapertussis NOT DETECTED NOT DETECTED Final   Chlamydophila pneumoniae NOT DETECTED NOT DETECTED Final   Mycoplasma pneumoniae NOT DETECTED NOT DETECTED Final    Comment: Performed at [facility], [address], [place], [place] [address]  Body fluid culture w Gram Stain     Status: None (Preliminary result)   Collection Time: 11/05/22  1:00 PM   Specimen: Pleural Fluid  Result Value Ref Range Status   Specimen Description   Final    PLEURAL Performed at [facility], [address] [place], [place] [address]    Special Requests   Final    NONE Performed at [facility], [address] [place], [place] [address]    Gram Stain   Final    FEW WBC PRESENT, PREDOMINANTLY PMN NO ORGANISMS SEEN    Culture   Final    NO GROWTH 3 DAYS Performed at [facility], [address], [place], [place] [address]    Report Status PENDING  Incomplete  Fungus Culture With Stain     Status: None (Preliminary result)   Collection Time: 11/05/22  1:00 PM   Specimen: Pleural Fluid  Result Value Ref Range Status   Fungus Stain Final report  Final    Comment: (NOTE) Performed At: [facility] [address] [place], [place] [id number] Jolene Schimke MD MU:[id number]    Fungus (Mycology) Culture PENDING  Incomplete   Fungal Source PLEURAL  Final    Comment: Performed at [facility], [address] [place], [place] [address]  Fungus Culture Result     Status: None   Collection Time: 11/05/22  1:00 PM  Result Value Ref Range Status   Result 1 Comment  Final    Comment: (NOTE) KOH/Calcofluor preparation:  no fungus observed. Performed At: [facility] [address]  [place], [place] [id number] Jolene Schimke MD HY:[id number]     Radiology Studies: DG CHEST PORT 1 VIEW  Result Date: 11/08/2022 CLINICAL DATA:  Pleural effusion. EXAM: PORTABLE CHEST 1 VIEW COMPARISON:  AP chest 11/07/2022 at 4:54 a.m. FINDINGS: Unchanged position of left basilar pigtail chest tube. Mild-to-moderate left pleural effusion with associated left midlung heterogeneous opacities, unchanged. The right lung is clear. Note is made of small left apical pneumothorax, unchanged from 11/07/2022 radiograph and better seen on most recent study of 11/07/2022 chest CT. The visualized cardiac silhouette and mediastinal contours are within normal limits. No acute skeletal abnormality. IMPRESSION: 1. Unchanged position of left basilar pigtail chest tube. 2. Left basilar lower lung opacities, representing a combination of pleural effusion, atelectasis, and left perihilar and lower lobe lung tumor better seen on yesterday's chest CT. 3. Unchanged small left apical pneumothorax. Electronically Signed   By: Neita Garnet M.D.   On: 11/08/2022 08:23   MR BRAIN W WO CONTRAST  Result Date: 11/08/2022 CLINICAL DATA:  Non-small cell lung cancer, staging EXAM: MRI HEAD WITHOUT AND WITH CONTRAST TECHNIQUE: Multiplanar, multiecho pulse sequences of the brain and surrounding structures were obtained without and with intravenous contrast. CONTRAST:  51mL GADAVIST GADOBUTROL 1 MMOL/ML IV SOLN COMPARISON:  None Available. FINDINGS: Brain: No abnormal parenchymal or meningeal enhancement. No restricted diffusion to suggest acute or subacute infarct. No acute hemorrhage, mass, mass effect, or midline shift. No hydrocephalus or extra-axial collection. No hemosiderin deposition to suggest remote hemorrhage. Normal pituitary. Low lying cerebellar tonsils, which extend 3 mm below the foramen magnum and do not meet criteria for Chiari I malformation. Vascular: Patent arterial flow voids. Skull and upper cervical spine: 8 mm  enhancing lesions in the right frontal calvarium (series 16, image 129 and 135). Sinuses/Orbits: Clear paranasal sinuses. No acute finding in the orbits. Other: The mastoid air cells are well aerated. IMPRESSION: 1. No evidence of metastatic disease in the brain. 2. 8 mm enhancing lesions in the right frontal calvarium, concerning for osseous metastatic disease in the setting of primary lung cancer. Electronically Signed   By: Wiliam Ke M.D.   On: 11/08/2022 01:32   CT CHEST ABDOMEN PELVIS W CONTRAST  Result Date: 11/07/2022 CLINICAL DATA:  Non-small cell lung cancer staging * Tracking Code: BO * EXAM: CT CHEST, ABDOMEN, AND PELVIS WITH CONTRAST TECHNIQUE: Multidetector CT imaging of the chest, abdomen and pelvis was performed following the standard protocol during bolus administration of intravenous contrast. RADIATION DOSE REDUCTION: This exam was performed according to the departmental dose-optimization program which includes automated exposure control, adjustment of the mA and/or kV according to patient size and/or use of iterative reconstruction technique. CONTRAST:  OMNIPAQUE IOHEXOL 300 MG/ML  SOLN COMPARISON:  11/04/2022 CT chest, and CT abdomen from 10/09/2011 FINDINGS: CT CHEST FINDINGS Cardiovascular: Suspected pericardial effusion especially along the cardiac apex, potentially with associated complexity as on image 44 series 2. Mediastinum/Nodes: Bulky but indistinctly marginated right and left paratracheal, AP window, prevascular, left internal mammary, subcarinal, left hilar, and left infrahilar adenopathy. Index right paratracheal node 1.9 cm in short axis, image 22 series 2. Index left internal mammary node 1.4 cm in short axis, image 24 series 2. The hilar and infrahilar adenopathy is somewhat conglomerate and difficult to individually measure. AP window lymph node 1.4 cm in short axis, image 24 series 2. Lungs/Pleura: Substantially reduced size of the left pleural effusion with a  left-sided chest  tube in place. Scattered loculations of gas in the left pleural space along with loculations of fluid along the major fissure and posteriorly. The left lower lobe bronchus is essentially occluded by central adenopathy in tumor, with the left upper lobe bronchus narrowed due to surrounding adenopathy. Complex appearance at the left lung base likely due to a combination of tumor in atelectatic lung for example on image 69 series 4. Suspected extensive tumor versus adherent atelectatic lung along the left diaphragm and pleural surface. Irregular deposition of tumor posteriorly along the pleural and diaphragmatic margin for example on image 95 series 4. It is difficult to confidently measure tumor separate from atelectatic lung but suspected tumor along the diaphragmatic surface measures about 9.9 by 6.4 cm on image 47 series 2. Secondary pulmonary lobular interstitial accentuation throughout the left lung. Small left apical hydropneumothorax with air-fluid level. Scattered ground-glass opacity in the aerated portions of the left lower lobe. Right apical pleuroparenchymal scarring. Right middle lobe nodule 0.9 by 0.6 cm, image 103 series 6. In the right lower lobe there are a few nodules measuring up to about 5 mm in diameter which could be inflammatory or metastatic. Musculoskeletal: Unremarkable CT ABDOMEN PELVIS FINDINGS Hepatobiliary: Unremarkable Pancreas: Unremarkable Spleen: Unremarkable Adrenals/Urinary Tract: No adrenal mass. The kidneys appear unremarkable. There is contrast medium in the collecting systems which could obscure nonobstructive calculi, but there is no hydronephrosis or hydroureter. Stomach/Bowel: Prominent stool throughout the colon favors constipation. Vascular/Lymphatic: A right gastric lymph node measures 10 mm in short axis on image 56 series 2. Reproductive: An IUD is satisfactorily positioned along the endometrium. Adnexa unremarkable. Other: There is a small amount of  right eccentric free pelvic fluid in the cul-de-sac on image 103 series 2, possibly physiologic but technically nonspecific. No well-defined nodularity along the omentum or peritoneal surfaces. Musculoskeletal: Mild edema tracks along the left lateral abdominal wall musculature, nonspecific and possibly related to the left chest tube. No specific indicators of osseous metastatic disease. IMPRESSION: 1. Bulky mediastinal, left hilar, and left infrahilar adenopathy compatible with malignancy. The left lower lobe bronchus is occluded and the left upper lobe bronchus is narrowed due to surrounding adenopathy. There is a large amount of tumor along the left pleural surface and diaphragm, along with additional pleural deposits of tumor, and a loculated left hydropneumothorax with chest tube in place. 2. There is also a 9 by 6 mm right middle lobe nodule and a few small nodules in the right lower lobe which could be inflammatory or metastatic. 3. Secondary pulmonary lobular interstitial accentuation in the left lung, probably from lymphangitic spread of tumor or edema. 4. Suspected pericardial effusion especially along the cardiac apex, potentially with associated complexity. Malignant pericardial effusion not excluded. 5. Small amount of free pelvic fluid in the cul-de-sac, possibly physiologic but technically nonspecific. 6. Prominent stool throughout the colon favors constipation. 7. Mild edema along the left lateral abdominal wall musculature, nonspecific and possibly related to the left chest tube. 8. The only finding of potential extra thoracic malignant involvement is a mildly enlarged right gastric lymph node (1.0 cm in diameter). Electronically Signed   By: Gaylyn Rong M.D.   On: 11/07/2022 21:54   DG CHEST PORT 1 VIEW  Result Date: 11/07/2022 CLINICAL DATA:  Pleural effusion on left. EXAM: PORTABLE CHEST 1 VIEW COMPARISON:  AP chest 11/06/2022, CT chest 11/04/2022 FINDINGS: Redemonstration of left  basilar pigtail chest tube. Mild interval improvement in now mild-to-moderate left pleural effusion, previously extending 60% up the left  hemithorax and now extending 30-40% up the left hemithorax. Associated left midlung linear densities. Minimal right costophrenic angle linear subsegmental atelectasis. No pneumothorax. Visualized cardiac silhouette and mediastinal contours are within normal limits. No acute skeletal abnormality. IMPRESSION: Redemonstration of left basilar pigtail chest tube. Mild interval improvement in now mild-to-moderate left pleural effusion Electronically Signed   By: Neita Garnet M.D.   On: 11/07/2022 08:29   DG CHEST PORT 1 VIEW  Result Date: 11/06/2022 CLINICAL DATA:  Chest tube placement.  [id number] EXAM: PORTABLE CHEST 1 VIEW COMPARISON:  11/06/2022 at 4:18 a.m. FINDINGS: There is a large left pleural effusion with basilar atelectasis or consolidation. Similar appearance to prior study. A left chest tube has been placed in the lower chest region. The right lung is clear. Heart size is obscured by the left pleural/parenchymal process. No pneumothorax. IMPRESSION: Large left pleural effusion with infiltration or atelectasis in the left base. Interval placement of a left chest tube. No pneumothorax. Electronically Signed   By: Burman Nieves M.D.   On: 11/06/2022 17:49    Scheduled Meds:  acetaminophen  650 mg Oral Q8H   escitalopram  10 mg Oral Daily   guaiFENesin  1,200 mg Oral BID   ibuprofen  600 mg Oral Q6H   lidocaine  20 mL Intradermal Once   loratadine  5 mg Oral Daily   sodium chloride flush  10 mL Intrapleural Q8H   Continuous Infusions:  sodium chloride Stopped (11/04/22 1705)    LOS: 4 days   Pleas Koch, MD [facility] 12:32 PM  [facility] Available via Epic secure chat 7am-7pm After these hours, please refer to coverage provider listed on [email or web address] 11/08/2022, 12:32 PM

## 2022-11-08 NOTE — Telephone Encounter (Signed)
Please arrange for hospital follow up in 1-2 weeks with an NP.  She will need a Chest XRAY at time of visit.     Canary Brim, MSN, APRN, NP-C, AGACNP-BC [facility] 11/08/2022, 6:27 PM   Please see [email or web address] for pager details.   From 7A-7P if no response, please call [phone] After hours, please call ELink [phone]

## 2022-11-09 ENCOUNTER — Encounter: Payer: Self-pay | Admitting: Family Medicine

## 2022-11-09 ENCOUNTER — Inpatient Hospital Stay (HOSPITAL_COMMUNITY): Payer: BC Managed Care – PPO

## 2022-11-09 DIAGNOSIS — J189 Pneumonia, unspecified organism: Secondary | ICD-10-CM | POA: Diagnosis not present

## 2022-11-09 DIAGNOSIS — C3492 Malignant neoplasm of unspecified part of left bronchus or lung: Secondary | ICD-10-CM | POA: Diagnosis not present

## 2022-11-09 HISTORY — PX: IR PERC PLEURAL DRAIN W/INDWELL CATH W/IMG GUIDE: IMG5383

## 2022-11-09 LAB — CULTURE, BLOOD (ROUTINE X 2)
Culture: NO GROWTH
Culture: NO GROWTH
Special Requests: ADEQUATE
Special Requests: ADEQUATE

## 2022-11-09 LAB — CBC
HCT: 37 % (ref 36.0–46.0)
Hemoglobin: 11.8 g/dL — ABNORMAL LOW (ref 12.0–15.0)
MCH: 29.1 pg (ref 26.0–34.0)
MCHC: 31.9 g/dL (ref 30.0–36.0)
MCV: 91.1 fL (ref 80.0–100.0)
Platelets: 376 10*3/uL (ref 150–400)
RBC: 4.06 MIL/uL (ref 3.87–5.11)
RDW: 12.2 % (ref 11.5–15.5)
WBC: 14.6 10*3/uL — ABNORMAL HIGH (ref 4.0–10.5)
nRBC: 0 % (ref 0.0–0.2)

## 2022-11-09 LAB — COMPREHENSIVE METABOLIC PANEL
ALT: 12 U/L (ref 0–44)
AST: 12 U/L — ABNORMAL LOW (ref 15–41)
Albumin: 2.3 g/dL — ABNORMAL LOW (ref 3.5–5.0)
Alkaline Phosphatase: 49 U/L (ref 38–126)
Anion gap: 9 (ref 5–15)
BUN: <5 mg/dL — ABNORMAL LOW (ref 6–20)
CO2: 24 mmol/L (ref 22–32)
Calcium: 8.1 mg/dL — ABNORMAL LOW (ref 8.9–10.3)
Chloride: 103 mmol/L (ref 98–111)
Creatinine, Ser: 0.43 mg/dL — ABNORMAL LOW (ref 0.44–1.00)
GFR, Estimated: >60 mL/min (ref >60)
Glucose, Bld: 99 mg/dL (ref 70–99)
Potassium: 3.7 mmol/L (ref 3.5–5.1)
Sodium: 136 mmol/L (ref 135–145)
Total Bilirubin: 0.6 mg/dL (ref 0.3–1.2)
Total Protein: 5.6 g/dL — ABNORMAL LOW (ref 6.5–8.1)

## 2022-11-09 LAB — BODY FLUID CULTURE W GRAM STAIN: Culture: NO GROWTH

## 2022-11-09 MED ORDER — ESCITALOPRAM OXALATE 10 MG PO TABS: 10.0000 mg | ORAL_TABLET | Freq: Every day | ORAL | 1 refills | Status: DC

## 2022-11-09 MED ORDER — LIDOCAINE-EPINEPHRINE 1 %-1:100000 IJ SOLN
INTRAMUSCULAR | Status: AC
  Administered 2022-11-09: 10 mL
  Filled 2022-11-09: qty 1

## 2022-11-09 MED ORDER — PSEUDOEPHEDRINE HCL ER 120 MG PO TB12: 120.0000 mg | ORAL_TABLET | Freq: Two times a day (BID) | ORAL | 0 refills | Status: DC

## 2022-11-09 MED ORDER — TRAMADOL HCL 50 MG PO TABS: 100.0000 mg | ORAL_TABLET | Freq: Four times a day (QID) | ORAL | 0 refills | Status: DC | PRN

## 2022-11-09 MED ORDER — PSEUDOEPHEDRINE HCL ER 120 MG PO TB12
120.0000 mg | ORAL_TABLET | Freq: Two times a day (BID) | ORAL | Status: DC
  Administered 2022-11-09: 120 mg via ORAL
  Filled 2022-11-09 (×2): qty 1

## 2022-11-09 MED ORDER — GUAIFENESIN ER 600 MG PO TB12: 1200.0000 mg | ORAL_TABLET | Freq: Two times a day (BID) | ORAL | 0 refills | Status: DC

## 2022-11-09 MED ORDER — MIDAZOLAM HCL 2 MG/2ML IJ SOLN
INTRAMUSCULAR | Status: AC | PRN
  Administered 2022-11-09 (×2): .5 mg via INTRAVENOUS
  Administered 2022-11-09 (×2): 1 mg via INTRAVENOUS

## 2022-11-09 MED ORDER — LORATADINE 10 MG PO TABS: 5.0000 mg | ORAL_TABLET | Freq: Every day | ORAL | 0 refills | Status: DC

## 2022-11-09 MED ORDER — MIDAZOLAM HCL 2 MG/2ML IJ SOLN
INTRAMUSCULAR | Status: AC
  Filled 2022-11-09: qty 4

## 2022-11-09 MED ORDER — CEFAZOLIN SODIUM-DEXTROSE 2-4 GM/100ML-% IV SOLN
INTRAVENOUS | Status: AC
  Filled 2022-11-09: qty 100

## 2022-11-09 MED ORDER — CEFAZOLIN SODIUM-DEXTROSE 2-4 GM/100ML-% IV SOLN: 2.0000 g | INTRAVENOUS | Status: DC

## 2022-11-09 MED ORDER — CEFAZOLIN SODIUM-DEXTROSE 2-4 GM/100ML-% IV SOLN
INTRAVENOUS | Status: AC | PRN
  Administered 2022-11-09: 2 g via INTRAVENOUS

## 2022-11-09 MED ORDER — SALINE SPRAY 0.65 % NA SOLN
1.0000 | NASAL | Status: DC | PRN
  Administered 2022-11-09: 1 via NASAL
  Filled 2022-11-09: qty 44

## 2022-11-09 MED ORDER — ALPRAZOLAM 0.5 MG PO TABS: 0.5000 mg | ORAL_TABLET | Freq: Three times a day (TID) | ORAL | 0 refills | Status: DC | PRN

## 2022-11-09 MED ORDER — LIDOCAINE HCL 1 % IJ SOLN
INTRAMUSCULAR | Status: AC
  Filled 2022-11-09: qty 20

## 2022-11-09 MED ORDER — SALINE SPRAY 0.65 % NA SOLN: 1.0000 | NASAL | 0 refills | Status: DC | PRN

## 2022-11-09 NOTE — Progress Notes (Signed)
PROGRESS NOTE (NOT CONSULT)     NAME:  Melissa Pineda, MRN:  [id number], DOB:  03/08/1986, LOS: 5 ADMISSION DATE:  11/04/2022, CONSULTATION DATE:  1/16 REFERRING MD: Dr. Marland Mcalpine CHIEF COMPLAINT:  pneumonia and pleural effusion   History of Present Illness:  37 y/o F with PMH significant for anxiety who presented to the ED 1/15 with worsening shortness of breath and left flank pain.  She had been seen previously in the ED on 1/10 with cough for one week and was diagnosed with pneumonia, covid and flu were negative.  She was treated with doxycycline and albuterol and discharged home,  however symptoms worsened so she returned.    CXR showed L lingular pneumonia with near complete opacification and CT chest with large left pleural effusion.   Labs showed a WBC of 16k and strep pneumo was negative.   She was admitted to the hospitalists and treated with Rocephin and Azithromycin and has not required [employer] oxygen.  PCCM consulted for pleural effusion.   She reports that her symptoms initially began approximately two months ago with a mild cold, however her dry cough persisted.  She initially went to urgent care and was given sudafed, then flew to [place] for Christmas and was seen again for cough.  States that her CXR was reportedly clear, so was treated with steroids.  She has had night sweats and chills, no measured fevers or weight loss.   Does not smoke or vape, no recent travel, occasionally smokes marijuana, but no other drug use, no regular alcohol use.   Pertinent  Medical History   has a past medical history of Anxiety.  Significant Hospital Events: Including procedures, antibiotic start and stop dates in addition to other pertinent events   1/15 presented to the ED with worsening L-sided pneumonia that failed outpatient treatment  1/16 PCCM consult, thoracentesis 1/17 Small bore chest tube insertion, 2L drained, [place] changed out overnight   Interim History / Subjective:   States that her breathing  is stable  Objective   Blood pressure 114/72, pulse (!) 101, temperature 98.6 F (37 C), temperature source Oral, resp. rate 18, height 5\' 6"  (1.676 m), weight 64.7 kg, last menstrual period 10/14/2022, SpO2 100 %, unknown if currently breastfeeding.        Intake/Output Summary (Last 24 hours) at 11/09/2022 0753 Last data filed at 11/09/2022 0000 Gross per 24 hour  Intake 840 ml  Output --  Net 840 ml   Filed Weights   11/05/22 1721 11/09/22 0552  Weight: 62.1 kg 64.7 kg   Exam: Blood pressure 114/72, pulse (!) 101, temperature 98.6 F (37 C), temperature source Oral, resp. rate 18, height 5\' 6"  (1.676 m), weight 64.7 kg, last menstrual period 10/14/2022, SpO2 100 %, unknown if currently breastfeeding. Gen:      No acute distress HEENT:  EOMI, sclera anicteric Neck:     No masses; no thyromegaly Lungs:    Clear to auscultation bilaterally; normal respiratory effort CV:         Regular rate and rhythm; no murmurs Abd:      + bowel sounds; soft, non-tender; no palpable masses, no distension Ext:    No edema; adequate peripheral perfusion Skin:      Warm and dry; no rash Neuro: alert and oriented x 3 Psych: normal mood and affect   X-rays reviewed today reviewed with slight increase in left lung base opacity, fluid.  Resolved Hospital Problem list     Assessment & Plan:  LLL PNA and Lymphocytic Predominant Pleural Effusion  Stage IVa NON-Small Cell Lung Cancer  Recent URI with failure of outpatient treatment with prednisone, cough suppression & subsequent doxycycline. COVID / Flu negative / RVP. S/p thoracentesis 1/16 with 1.5L volume removed. Subsequent re-accumulation on CXR 1/17.  Pleural fluid with LDH 583, glucose 76, TNC 1,994. Exudative by LDH (ratio 2.1).  S/p chest tube placement.  Cytology consistent with adenocarcinoma consistent with lung primary.   Outpatient follow-up with Dr. Arbutus Ped.  Awaiting molecular studies on tumor cells Off antibiotics Chest x-ray  today reviewed with slight increase in effusion within 1 day.  She is reluctant to get a Pleurx but may end up back in the hospital with reaccumulated effusion before she can get to outpatient treatment. We had a long discussion with patient and mother and they have agreed to get the Pleurx catheter IR order placed.  Situational Anxiety  Continue Lexapro, Xanax as needed   Best Practice (right click and "Reselect all SmartList Selections" daily)  Per primary  Signature:   Chilton Greathouse MD [facility] See Amion for pager  If no response to pager , please call [phone] until 7pm After 7:00 pm call Elink  [phone] 11/09/2022, 7:55 AM

## 2022-11-09 NOTE — Discharge Summary (Signed)
Physician Discharge Summary  Melissa Pineda BJY:[id number] DOB: 04/09/1986 DOA: 11/04/2022  PCP: Patient, No Pcp Per  Admit date: 11/04/2022 Discharge date: 11/09/2022  Time spent: 37 minutes  Recommendations for Outpatient Follow-up:  Needs outpatient close follow-up with Dr. Lorna Few of oncology for options of treatment for NSCLC Pleurx drainage and management as per oncology-Home health nursing has been initiated to help teach this Consider initiation of iron therapy in the outpatient setting  Discharge Diagnoses:  MAIN problem for hospitalization   Stage IV NSCLC Malignant pleural effusion Situational anxiety Sinusitis  Please see below for itemized issues addressed in [place]- refer to other progress notes for clarity if needed  Discharge Condition: Fair  Diet recommendation: Regular  Filed Weights   11/05/22 1721 11/09/22 0552  Weight: 62.1 kg 64.7 kg    History of present illness:  37 year old Caucasian known anxiety She first developed this 8 weeks prior, went to urgent care, received steroids 12/29 at [facility], then when she stopped the steroids, and felt worse , was diagnosed with a pneumonia 10/30/22 in ER and was discharged home on doxycycline and an albuterol inhaler.     returned to the ED 1/15 with worsening shortness of breath, cough, as well as fever.  +  Pleuritic chest pain that was 9 out of 10 in severity and left-sided in the ED  found to have a lingular pneumonia with near complete opacification of the area with a large left-sided pleural effusion.   also reactive lymphadenopathy and she is admitted to the hospital with community-acquired pneumonia and failed outpatient treatment as well as a left-sided pleural effusion.     Chest x-ray --worsening of her pleural effusion    1/16 pulmonary was consulted and they did a bedside thoracentesis which drained 1.9 L of blood-tinged cloudy fluid.  1/17 pigtail catheter placed after rapid reaccumulation of  fluid 1/18 pathology shows adenocarcinoma lung oncology consulted 1/19 chest tube removed    Hospital Course:  stage IV adenocarcinoma lung -Initial thought process was that patient had an infectious etiology but with rapid recurrence, lymphocytic predominance, and now positive cytology this is unlikely -d/w family 1/18 , with oncologist in the room Dr. 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 - Chest tube has been pulled on 1/19, but accumulated rapidly -PleurX catheter placed on 1/20 with 600 cc being drained on 1/20 - teaching needs to be done before patient can comfortably discharge home   Sinusitis - Added Sudafed on 1/20 - Continue Claritin 5 daily as well as saline Ocean Spray -Prescription given for all meds   Anxiety Disorder -Not on any Meds PTA -Had to be given 1 mg of Alprazolam po x1, started Xanax 0.5 3 times daily and limited prescription has been given -Lexapro ordered with 1 refill and patient has been cautioned briefly about side effects and not to operate heavy machinery   Hyponatremia -resolved   Leukocytosis -Probably related to underlying malignancy-would not further check and has received 1 to 2 days of therapy   Normocytic Anemia, iron level 22 saturations 8 -Treat with iron as an outpatient -Start iron on discharge in the outpatient setting as per oncology  Discharge Exam: Vitals:   11/09/22 1226 11/09/22 1255  BP: 105/64 96/83  Pulse: (!) 103 92  Resp: (!) 27 18  Temp:  98.3 F (36.8 C)  SpO2: 97% 97%    Subj on day of d/c   Moderate headache with sinus pressure did not sleep at all and feels she would be better served at home PleurX catheter chest drain she is a little drowsy but mom is present  General Exam on discharge  EOMI NCAT no focal  deficit no icterus no pallor no rales or rhonchi no wheeze but decreased air entry posterolaterally, Pleurx drain noted on left side S1-S2 no murmur Abdomen is soft no rebound No lower extremity edema  Discharge Instructions   Discharge Instructions     Ambulatory Pleural Drainage Schedule   Complete by: As directed    Drain daily, up to max of 1L until patient is only able to drain out . If <163ml for 3 consecutive drains every other day, then call Interventional Radiology [phone]) for evaluation and possible removal.   Diet - low sodium heart healthy   Complete by: As directed    Discharge instructions   Complete by: As directed    please follow up with Dr. Milagros Evener will guide you with regards to next needs and options for therapy You will be discharging with a Pleurx drain and we will get home health to come out and teach you how to use it so that you can drain the fluid when you start feeling "full" I would recommend if you have high fevers chills or any other constitutional symptoms you keep a monitor of them but fevers >101 continuously needs evaluation For your sinus issues we will call in some Sudafed Claritin and other meds please take them Best of luck and TakeCare   Increase activity slowly   Complete by: As directed       Allergies as of 11/09/2022   No Known Allergies      Medication List     STOP taking these medications    doxycycline 100 MG capsule Commonly known as: VIBRAMYCIN   VITAMIN C PO       TAKE these medications    albuterol 108 (90 Base) MCG/ACT inhaler Commonly known as: VENTOLIN HFA Inhale into the lungs.   ALPRAZolam 0.5 MG tablet Commonly known as: XANAX Take 1 tablet (0.5 mg total) by mouth 3 (three) times daily as needed for anxiety.   docusate sodium 100 MG capsule Commonly known as: Colace Take 1 capsule (100 mg total) by mouth 2 (two) times daily.   escitalopram 10 MG tablet Commonly known as: LEXAPRO Take 1  tablet (10 mg total) by mouth daily. Start taking on: November 10, 2022   guaiFENesin 600 MG 12 hr tablet Commonly known as: MUCINEX Take 2 tablets (1,200 mg total) by mouth 2 (two) times daily.   ibuprofen 200 MG tablet Commonly known as: ADVIL Take 400-600 mg by mouth 2 (two) times daily as needed for headache or mild pain.   loratadine 10 MG tablet Commonly known as: CLARITIN Take 0.5 tablets (5 mg total) by mouth daily. Start taking on: November 10, 2022   Mirena (52 MG) 20 MCG/DAY Iud Generic drug: levonorgestrel 1 each by Intrauterine route once.   pseudoephedrine 120 MG 12 hr tablet Commonly known as: SUDAFED Take 1 tablet (120 mg total) by mouth 2 (two) times daily.   sodium chloride 0.65 % Soln nasal spray Commonly known as: OCEAN Place 1 spray into both nostrils as needed for congestion.   traMADol 50 MG tablet Commonly known as: ULTRAM Take 2 tablets (100 mg total) by mouth every 6 (six) hours as  needed for moderate pain.       No Known Allergies  Follow-up Information     [facility] Follow up.   Specialty: Pulmonology Why: Office will call you for an appt to be seen with follow up Chest XRAY. Contact information: [address] [place] [address] [phone]                 The results of significant diagnostics from this hospitalization (including imaging, microbiology, ancillary and laboratory) are listed below for reference.    Significant Diagnostic Studies: DG Chest Port 1 View  Result Date: 11/09/2022 CLINICAL DATA:  Non-small-cell lung cancer.  Drain placement. EXAM: PORTABLE CHEST 1 VIEW COMPARISON:  11/09/2022, earlier same day FINDINGS: Diffuse interstitial opacity in the left lung is similar to prior with persistent left base collapse/consolidation and moderate left pleural effusion. Hazy opacity over the left apex may represent fluid in the fissure or loculated  posterior pleural effusions seen on previous CT imaging. Left pleural drain is new in the interval. Right lung remains clear. Cardiopericardial silhouette is at upper limits of normal for size. The visualized bony structures of the thorax are unremarkable. IMPRESSION: Interval placement of left pleural drain with persistent left base collapse/consolidation and moderate left pleural effusion. Electronically Signed   By: Kennith Center M.D.   On: 11/09/2022 12:55   [place] CHEST (PLEURAL EFFUSION)  Result Date: 11/09/2022 CLINICAL DATA:  Evaluate pleural effusion. EXAM: CHEST ULTRASOUND COMPARISON:  Chest radiograph 11/09/2022 FINDINGS: There is a moderate amount of complicated fluid demonstrated within the left chest compatible with pleural effusion. No significant right pleural effusion identified. IMPRESSION: Moderate left pleural effusion. Electronically Signed   By: Annia Belt M.D.   On: 11/09/2022 09:51   DG CHEST PORT 1 VIEW  Result Date: 11/09/2022 CLINICAL DATA:  Pleural effusion. EXAM: PORTABLE CHEST 1 VIEW COMPARISON:  11/08/2022 FINDINGS: Patient is slightly rotated to the right. Interval removal of left basilar pigtail pleural drainage catheter. Stable to slight interval worsening of large left pleural effusion with loculation over the left upper lobe/apex. No definite left apical pneumothorax. Right lung is clear. Cardiomediastinal silhouette and remainder of the exam is unchanged. IMPRESSION: Stable to slight interval worsening of large left pleural effusion with loculation over the left upper lobe/apex. Interval removal of left basilar pigtail pleural drainage catheter. No definite left pneumothorax. Electronically Signed   By: Elberta Fortis M.D.   On: 11/09/2022 08:11   DG CHEST PORT 1 VIEW  Result Date: 11/08/2022 CLINICAL DATA:  Pleural effusion. EXAM: PORTABLE CHEST 1 VIEW COMPARISON:  AP chest 11/07/2022 at 4:54 a.m. FINDINGS: Unchanged position of left basilar pigtail chest tube.  Mild-to-moderate left pleural effusion with associated left midlung heterogeneous opacities, unchanged. The right lung is clear. Note is made of small left apical pneumothorax, unchanged from 11/07/2022 radiograph and better seen on most recent study of 11/07/2022 chest CT. The visualized cardiac silhouette and mediastinal contours are within normal limits. No acute skeletal abnormality. IMPRESSION: 1. Unchanged position of left basilar pigtail chest tube. 2. Left basilar lower lung opacities, representing a combination of pleural effusion, atelectasis, and left perihilar and lower lobe lung tumor better seen on yesterday's chest CT. 3. Unchanged small left apical pneumothorax. Electronically Signed   By: Neita Garnet M.D.   On: 11/08/2022 08:23   MR BRAIN W WO CONTRAST  Result Date: 11/08/2022 CLINICAL DATA:  Non-small cell lung cancer, staging EXAM: MRI HEAD WITHOUT  AND WITH CONTRAST TECHNIQUE: Multiplanar, multiecho pulse sequences of the brain and surrounding structures were obtained without and with intravenous contrast. CONTRAST:  50mL GADAVIST GADOBUTROL 1 MMOL/ML IV SOLN COMPARISON:  None Available. FINDINGS: Brain: No abnormal parenchymal or meningeal enhancement. No restricted diffusion to suggest acute or subacute infarct. No acute hemorrhage, mass, mass effect, or midline shift. No hydrocephalus or extra-axial collection. No hemosiderin deposition to suggest remote hemorrhage. Normal pituitary. Low lying cerebellar tonsils, which extend 3 mm below the foramen magnum and do not meet criteria for Chiari I malformation. Vascular: Patent arterial flow voids. Skull and upper cervical spine: 8 mm enhancing lesions in the right frontal calvarium (series 16, image 129 and 135). Sinuses/Orbits: Clear paranasal sinuses. No acute finding in the orbits. Other: The mastoid air cells are well aerated. IMPRESSION: 1. No evidence of metastatic disease in the brain. 2. 8 mm enhancing lesions in the right frontal  calvarium, concerning for osseous metastatic disease in the setting of primary lung cancer. Electronically Signed   By: Wiliam Ke M.D.   On: 11/08/2022 01:32   CT CHEST ABDOMEN PELVIS W CONTRAST  Result Date: 11/07/2022 CLINICAL DATA:  Non-small cell lung cancer staging * Tracking Code: BO * EXAM: CT CHEST, ABDOMEN, AND PELVIS WITH CONTRAST TECHNIQUE: Multidetector CT imaging of the chest, abdomen and pelvis was performed following the standard protocol during bolus administration of intravenous contrast. RADIATION DOSE REDUCTION: This exam was performed according to the departmental dose-optimization program which includes automated exposure control, adjustment of the mA and/or kV according to patient size and/or use of iterative reconstruction technique. CONTRAST:  OMNIPAQUE IOHEXOL 300 MG/ML  SOLN COMPARISON:  11/04/2022 CT chest, and CT abdomen from 10/09/2011 FINDINGS: CT CHEST FINDINGS Cardiovascular: Suspected pericardial effusion especially along the cardiac apex, potentially with associated complexity as on image 44 series 2. Mediastinum/Nodes: Bulky but indistinctly marginated right and left paratracheal, AP window, prevascular, left internal mammary, subcarinal, left hilar, and left infrahilar adenopathy. Index right paratracheal node 1.9 cm in short axis, image 22 series 2. Index left internal mammary node 1.4 cm in short axis, image 24 series 2. The hilar and infrahilar adenopathy is somewhat conglomerate and difficult to individually measure. AP window lymph node 1.4 cm in short axis, image 24 series 2. Lungs/Pleura: Substantially reduced size of the left pleural effusion with a left-sided chest tube in place. Scattered loculations of gas in the left pleural space along with loculations of fluid along the major fissure and posteriorly. The left lower lobe bronchus is essentially occluded by central adenopathy in tumor, with the left upper lobe bronchus narrowed due to surrounding  adenopathy. Complex appearance at the left lung base likely due to a combination of tumor in atelectatic lung for example on image 69 series 4. Suspected extensive tumor versus adherent atelectatic lung along the left diaphragm and pleural surface. Irregular deposition of tumor posteriorly along the pleural and diaphragmatic margin for example on image 95 series 4. It is difficult to confidently measure tumor separate from atelectatic lung but suspected tumor along the diaphragmatic surface measures about 9.9 by 6.4 cm on image 47 series 2. Secondary pulmonary lobular interstitial accentuation throughout the left lung. Small left apical hydropneumothorax with air-fluid level. Scattered ground-glass opacity in the aerated portions of the left lower lobe. Right apical pleuroparenchymal scarring. Right middle lobe nodule 0.9 by 0.6 cm, image 103 series 6. In the right lower lobe there are a few nodules measuring up to about 5 mm in diameter which  could be inflammatory or metastatic. Musculoskeletal: Unremarkable CT ABDOMEN PELVIS FINDINGS Hepatobiliary: Unremarkable Pancreas: Unremarkable Spleen: Unremarkable Adrenals/Urinary Tract: No adrenal mass. The kidneys appear unremarkable. There is contrast medium in the collecting systems which could obscure nonobstructive calculi, but there is no hydronephrosis or hydroureter. Stomach/Bowel: Prominent stool throughout the colon favors constipation. Vascular/Lymphatic: A right gastric lymph node measures 10 mm in short axis on image 56 series 2. Reproductive: An IUD is satisfactorily positioned along the endometrium. Adnexa unremarkable. Other: There is a small amount of right eccentric free pelvic fluid in the cul-de-sac on image 103 series 2, possibly physiologic but technically nonspecific. No well-defined nodularity along the omentum or peritoneal surfaces. Musculoskeletal: Mild edema tracks along the left lateral abdominal wall musculature, nonspecific and possibly  related to the left chest tube. No specific indicators of osseous metastatic disease. IMPRESSION: 1. Bulky mediastinal, left hilar, and left infrahilar adenopathy compatible with malignancy. The left lower lobe bronchus is occluded and the left upper lobe bronchus is narrowed due to surrounding adenopathy. There is a large amount of tumor along the left pleural surface and diaphragm, along with additional pleural deposits of tumor, and a loculated left hydropneumothorax with chest tube in place. 2. There is also a 9 by 6 mm right middle lobe nodule and a few small nodules in the right lower lobe which could be inflammatory or metastatic. 3. Secondary pulmonary lobular interstitial accentuation in the left lung, probably from lymphangitic spread of tumor or edema. 4. Suspected pericardial effusion especially along the cardiac apex, potentially with associated complexity. Malignant pericardial effusion not excluded. 5. Small amount of free pelvic fluid in the cul-de-sac, possibly physiologic but technically nonspecific. 6. Prominent stool throughout the colon favors constipation. 7. Mild edema along the left lateral abdominal wall musculature, nonspecific and possibly related to the left chest tube. 8. The only finding of potential extra thoracic malignant involvement is a mildly enlarged right gastric lymph node (1.0 cm in diameter). Electronically Signed   By: Gaylyn Rong M.D.   On: 11/07/2022 21:54   DG CHEST PORT 1 VIEW  Result Date: 11/07/2022 CLINICAL DATA:  Pleural effusion on left. EXAM: PORTABLE CHEST 1 VIEW COMPARISON:  AP chest 11/06/2022, CT chest 11/04/2022 FINDINGS: Redemonstration of left basilar pigtail chest tube. Mild interval improvement in now mild-to-moderate left pleural effusion, previously extending 60% up the left hemithorax and now extending 30-40% up the left hemithorax. Associated left midlung linear densities. Minimal right costophrenic angle linear subsegmental atelectasis. No  pneumothorax. Visualized cardiac silhouette and mediastinal contours are within normal limits. No acute skeletal abnormality. IMPRESSION: Redemonstration of left basilar pigtail chest tube. Mild interval improvement in now mild-to-moderate left pleural effusion Electronically Signed   By: Neita Garnet M.D.   On: 11/07/2022 08:29   DG CHEST PORT 1 VIEW  Result Date: 11/06/2022 CLINICAL DATA:  Chest tube placement.  [id number] EXAM: PORTABLE CHEST 1 VIEW COMPARISON:  11/06/2022 at 4:18 a.m. FINDINGS: There is a large left pleural effusion with basilar atelectasis or consolidation. Similar appearance to prior study. A left chest tube has been placed in the lower chest region. The right lung is clear. Heart size is obscured by the left pleural/parenchymal process. No pneumothorax. IMPRESSION: Large left pleural effusion with infiltration or atelectasis in the left base. Interval placement of a left chest tube. No pneumothorax. Electronically Signed   By: Burman Nieves M.D.   On: 11/06/2022 17:49   DG CHEST PORT 1 VIEW  Result Date: 11/06/2022 CLINICAL DATA:  Follow-up  pleural effusion. EXAM: PORTABLE CHEST 1 VIEW COMPARISON:  11/05/22 FINDINGS: Stable cardiomediastinal contours. There is a large left pleural effusion which appears unchanged in volume from the previous exam. Right lung appears clear. No new findings. IMPRESSION: No change in large left pleural effusion. Electronically Signed   By: Signa Kell M.D.   On: 11/06/2022 08:15   DG CHEST PORT 1 VIEW  Result Date: 11/05/2022 CLINICAL DATA:  Post thoracentesis. EXAM: PORTABLE CHEST 1 VIEW COMPARISON:  Chest x-ray from earlier same day. FINDINGS: Decreased size of the LEFT pleural effusion status post thoracentesis. Large opacity persists at the LEFT lung base, likely residual pleural effusion and/or atelectasis/airspace collapse. RIGHT lung is clear. No pneumothorax is seen. Heart size and mediastinal contours are grossly stable. IMPRESSION: 1.  Decreased size of the LEFT pleural effusion status post thoracentesis. No pneumothorax. 2. Large opacity persists at the LEFT lung base, likely residual pleural effusion and/or atelectasis/airspace collapse. Electronically Signed   By: Bary Richard M.D.   On: 11/05/2022 13:24   DG CHEST PORT 1 VIEW  Result Date: 11/05/2022 CLINICAL DATA:  Short of breath EXAM: PORTABLE CHEST 1 VIEW COMPARISON:  11/04/2022 FINDINGS: LEFT cardiac silhouette is obscured large LEFT effusion. LEFT pleural effusion occupies 75% of LEFT hemithorax and is increased comparison exam. RIGHT lung clear. No pneumothorax. IMPRESSION: Increasing large LEFT effusion occupying 75% of the LEFT hemithorax. Electronically Signed   By: Genevive Bi M.D.   On: 11/05/2022 10:14   CT Angio Chest PE W and/or Wo Contrast  Result Date: 11/04/2022 CLINICAL DATA:  Pneumonia and worsening shortness of breath EXAM: CT ANGIOGRAPHY CHEST WITH CONTRAST TECHNIQUE: Multidetector CT imaging of the chest was performed using the standard protocol during bolus administration of intravenous contrast. Multiplanar CT image reconstructions and MIPs were obtained to evaluate the vascular anatomy. RADIATION DOSE REDUCTION: This exam was performed according to the departmental dose-optimization program which includes automated exposure control, adjustment of the mA and/or kV according to patient size and/or use of iterative reconstruction technique. CONTRAST:  92mL OMNIPAQUE IOHEXOL 350 MG/ML SOLN COMPARISON:  Chest radiograph dated 10/30/2022 FINDINGS: Cardiovascular: The study is high quality for the evaluation of pulmonary embolism. There are no filling defects in the central, lobar, segmental or subsegmental pulmonary artery branches to suggest acute pulmonary embolism. Great vessels are normal in course and caliber. Normal heart size. No significant pericardial fluid/thickening. Mediastinum/Nodes: Imaged thyroid gland without nodules meeting criteria for  imaging follow-up by size. Rightward shift of the mediastinum. Normal esophagus. Multi station lymphadenopathy including 1.3 cm left internal mammary lymph node(5:98), 2.0 cm subcarinal lymph node (4:59), and 1.4 cm prevascular lymph node (4:51). Lungs/Pleura: The central airways are patent. Near-complete atelectasis of the lingula and left lower lobe with heterogeneous consolidation resulting in marked narrowing of the left sided airways. Dependent ground-glass opacity in the left upper lobe. No pneumothorax. Large left pleural effusion. Upper abdomen: Normal. Musculoskeletal: No acute or abnormal lytic or blastic osseous lesions. Review of the MIP images confirms the above findings. IMPRESSION: 1. No acute pulmonary embolism. 2. Near-complete atelectasis of the lingula and left lower lobe with heterogeneous consolidation resulting in marked narrowing of the left sided airways, likely multifocal pneumonia. 3. Large left pleural effusion. 4. Multi station mediastinal lymphadenopathy likely reactive Electronically Signed   By: Agustin Cree M.D.   On: 11/04/2022 14:51   DG Chest Port 1 View  Result Date: 11/04/2022 CLINICAL DATA:  Provided history: Left chest pain. EXAM: PORTABLE CHEST 1 VIEW COMPARISON:  Prior chest radiograph 10/30/2022. FINDINGS: Significant interval increase in size of a left pleural effusion, now moderate-to-large. Underlying atelectasis and/or airspace consolidation within the mid and lower left lung, also significantly progressed. No appreciable airspace consolidation on the right. Portions of the cardiac silhouette are obscured on the left, limiting evaluation of heart size. No evidence of pneumothorax. No acute bony abnormality identified. IMPRESSION: Significant interval increase in size of a left pleural effusion, now moderate-to-large. Underlying atelectasis and/or airspace consolidation within the mid and lower left lung has also significantly progressed from the prior examination of  10/30/2022. Electronically Signed   By: Jackey Loge D.O.   On: 11/04/2022 12:22   DG Chest Portable 1 View  Result Date: 10/30/2022 CLINICAL DATA:  Cough, headache, fatigue, night sweats. EXAM: PORTABLE CHEST 1 VIEW COMPARISON:  Overlapping portions CT abdomen 10/09/2011 FINDINGS: Left lower lobe and possible lingular consolidation. Pleural thickening along the left lateral costophrenic angle may reflect adjacent pleural effusion. The right lung appears clear. Cardiac and mediastinal margins appear normal. IMPRESSION: 1. Left lower lobe and possible lingular consolidation with small left pleural effusion. Findings favor left basilar pneumonia over atelectasis. Followup PA and lateral chest X-ray is recommended in 3-4 weeks following trial of antibiotic therapy to ensure resolution and exclude the unlikely possibility of underlying malignancy. Electronically Signed   By: Gaylyn Rong M.D.   On: 10/30/2022 10:54    Microbiology: Recent Results (from the past 240 hour(s))  Blood culture (routine x 2)     Status: None   Collection Time: 11/04/22  1:20 PM   Specimen: BLOOD RIGHT HAND  Result Value Ref Range Status   Specimen Description   Final    BLOOD RIGHT HAND Performed at [facility], [address], [place], [place] [address]    Special Requests   Final    BOTTLES DRAWN AEROBIC AND ANAEROBIC Blood Culture adequate volume Performed at [facility], [address], [place], [place] [address]    Culture   Final    NO GROWTH 5 DAYS Performed at [facility], [address] [place], [place] [address]    Report Status 11/09/2022 FINAL  Final  Blood culture (routine x 2)     Status: None   Collection Time: 11/04/22  1:40 PM   Specimen: BLOOD  Result Value Ref Range Status   Specimen Description   Final    BLOOD LEFT ANTECUBITAL Performed at [facility], [address], [place], [place] [address]    Special Requests    Final    Blood Culture adequate volume BOTTLES DRAWN AEROBIC AND ANAEROBIC Performed at [facility], [address] [place], [place] [address]    Culture   Final    NO GROWTH 5 DAYS Performed at [facility], [address], [place], [place] [address]    Report Status 11/09/2022 FINAL  Final  Respiratory (~20 pathogens) panel by PCR     Status: None   Collection Time: 11/05/22 10:36 AM   Specimen: Nasopharyngeal Swab; Respiratory  Result Value Ref Range Status   Adenovirus NOT DETECTED NOT DETECTED Final   Coronavirus 229E NOT DETECTED NOT DETECTED Final    Comment: (NOTE) The Coronavirus on the Respiratory Panel, DOES NOT test for the novel  Coronavirus (2019 nCoV)    Coronavirus HKU1 NOT DETECTED NOT DETECTED Final   Coronavirus NL63 NOT DETECTED NOT DETECTED Final   Coronavirus OC43 NOT DETECTED NOT DETECTED Final   Metapneumovirus NOT  DETECTED NOT DETECTED Final   Rhinovirus / Enterovirus NOT DETECTED NOT DETECTED Final   Influenza A NOT DETECTED NOT DETECTED Final   Influenza B NOT DETECTED NOT DETECTED Final   Parainfluenza Virus 1 NOT DETECTED NOT DETECTED Final   Parainfluenza Virus 2 NOT DETECTED NOT DETECTED Final   Parainfluenza Virus 3 NOT DETECTED NOT DETECTED Final   Parainfluenza Virus 4 NOT DETECTED NOT DETECTED Final   Respiratory Syncytial Virus NOT DETECTED NOT DETECTED Final   Bordetella pertussis NOT DETECTED NOT DETECTED Final   Bordetella Parapertussis NOT DETECTED NOT DETECTED Final   Chlamydophila pneumoniae NOT DETECTED NOT DETECTED Final   Mycoplasma pneumoniae NOT DETECTED NOT DETECTED Final    Comment: Performed at [facility], [address] [place], [place] [address]  Body fluid culture w Gram Stain     Status: None   Collection Time: 11/05/22  1:00 PM   Specimen: Pleural Fluid  Result Value Ref Range Status   Specimen Description   Final    PLEURAL Performed at [facility], [address], [place], [place] [address]    Special Requests   Final    NONE Performed at [facility], [address], [place], [place] [address]    Gram Stain   Final    FEW WBC PRESENT, PREDOMINANTLY PMN NO ORGANISMS SEEN    Culture   Final    NO GROWTH 3 DAYS Performed at [facility], [address] [place], [place] [address]    Report Status 11/09/2022 FINAL  Final  Fungus Culture With Stain     Status: None (Preliminary result)   Collection Time: 11/05/22  1:00 PM   Specimen: Pleural Fluid  Result Value Ref Range Status   Fungus Stain Final report  Final    Comment: (NOTE) Performed At: [facility] [address] [place], [place] [id number] Jolene Schimke MD ZG:[id number]    Fungus (Mycology) Culture PENDING  Incomplete   Fungal Source PLEURAL  Final    Comment: Performed at [facility], [address], [place], [place] [address]  Fungus Culture Result     Status: None   Collection Time: 11/05/22  1:00 PM  Result Value Ref Range Status   Result 1 Comment  Final    Comment: (NOTE) KOH/Calcofluor preparation:  no fungus observed. Performed At: [facility] [address] [place], [place] [id number] Jolene Schimke MD ZI:[id number]      Labs: Basic Metabolic Panel: Recent Labs  Lab 11/04/22 1153 11/05/22 0419 11/05/22 0425 11/06/22 0432 11/07/22 0429 11/08/22 0428 11/09/22 0513  NA 134* 136  --  136 136 139 136  K 3.7 3.7  --  3.7 4.3 3.9 3.7  CL 100 103  --  104 101 103 103  CO2 24 23  --  26 27 25 24   GLUCOSE 145* 142*  --  118* 106* 104* 99  BUN 7 5*  --  <5* <5* <5* <5*  CREATININE 0.56 0.49  --  0.65 0.51 0.50 0.43*  CALCIUM 9.0 8.1*  --  8.0* 8.2* 8.2* 8.1*  MG 1.8  --  1.9 1.8  --   --   --   PHOS 2.1*  --  2.5 2.9  --   --   --    Liver Function Tests: Recent Labs  Lab 11/05/22 0419 11/06/22 0432 11/07/22 0429 11/08/22 0428 11/09/22 0513  AST 19 12* 12* 11* 12*  ALT 14  14 13 12  12   ALKPHOS 56 50 49 50 49  BILITOT 0.5 0.7 0.7 0.7 0.6  PROT 6.1* 5.6* 5.8* 6.0* 5.6*  ALBUMIN 3.1* 2.7* 2.6* 2.6* 2.3*   Recent Labs  Lab 11/04/22 1153  LIPASE <10*   No results for input(s): "AMMONIA" in the last 168 hours. CBC: Recent Labs  Lab 11/04/22 1153 11/05/22 0419 11/06/22 0432 11/07/22 0429 11/08/22 0428 11/09/22 0513  WBC 16.6* 15.3* 15.3* 15.0* 14.1* 14.6*  NEUTROABS 13.5*  --  11.3*  --   --   --   HGB 13.1 11.3* 11.4* 12.8 12.5 11.8*  HCT 39.1 35.5* 34.7* 39.6 38.6 37.0  MCV 88.1 91.0 89.0 89.6 89.4 91.1  PLT 347 311 314 323 364 376   Cardiac Enzymes: No results for input(s): "CKTOTAL", "CKMB", "CKMBINDEX", "TROPONINI" in the last 168 hours. BNP: BNP (last 3 results) Recent Labs    11/04/22 1153  BNP 14.3    ProBNP (last 3 results) No results for input(s): "PROBNP" in the last 8760 hours.  CBG: No results for input(s): "GLUCAP" in the last 168 hours.     Signed:  11/06/22 MD   [facility] 11/09/2022, 3:11 PM

## 2022-11-09 NOTE — Consult Note (Signed)
Chief Complaint: Patient was seen in consultation today for left pleurx catheter placement Chief Complaint  Patient presents with   Pneumonia   Shortness of Breath    Referring Physician(s): Mannam,P  Supervising Physician: Richarda Overlie  Patient Status: [facility] - In-pt  History of Present Illness: Melissa Pineda is a 37 y.o. female, non smoker,  with past medical history of anxiety, left lower lobe pneumonia and malignant left pleural effusion, stage IVa non-small cell lung cancer, recent thoracentesis with chest drain placement 1/17 by CCM followed by chest drain removal yesterday (due to minimal output) who presents now with recurrent left pleural effusion. Request now received from CCM for left Pleurx catheter placement.   Past Medical History:  Diagnosis Date   Anxiety    [employer] 11/21/2020    Past Surgical History:  Procedure Laterality Date   WISDOM TOOTH EXTRACTION      Allergies: Patient has no known allergies.  Medications: Prior to Admission medications   Medication Sig Start Date End Date Taking? Authorizing Provider  Ascorbic Acid (VITAMIN C PO) Take 2 tablets by mouth daily.   Yes [provider]  doxycycline (VIBRAMYCIN) 100 MG capsule Take 1 capsule (100 mg total) by mouth 2 (two) times daily. 10/30/22  Yes Curatolo, Adam, DO  ibuprofen (ADVIL) 200 MG tablet Take 400-600 mg by mouth 2 (two) times daily as needed for headache or mild pain.   Yes [provider]  levonorgestrel (MIRENA, 52 MG,) 20 MCG/DAY IUD 1 each by Intrauterine route once. 12/18/21  Yes [provider]  albuterol (VENTOLIN HFA) 108 (90 Base) MCG/ACT inhaler Inhale into the lungs. Patient not taking: Reported on 11/05/2022 10/17/22   [provider]  docusate sodium (COLACE) 100 MG capsule Take 1 capsule (100 mg total) by mouth 2 (two) times daily. Patient not taking: Reported on 11/05/2022 11/01/21   Charlett Nose, MD     Family History  Problem  Relation Age of Onset   Cancer Mother    Cancer Maternal Grandmother    Cancer Maternal Grandfather    Hypertension Paternal Grandmother    Cancer Paternal Grandfather     Social History   Socioeconomic History   Marital status: Married    Spouse name: Not on file   Number of children: Not on file   Years of education: Not on file   Highest education level: Not on file  Occupational History   Not on file  Tobacco Use   Smoking status: Never   Smokeless tobacco: Never  Vaping Use   Vaping Use: Never used  Substance and Sexual Activity   Alcohol use: Yes    Alcohol/week: 7.0 standard drinks of alcohol    Types: 7 Glasses of wine per week    Comment: 1 glass wine in the evening    Drug use: Yes    Types: Marijuana    Comment: occasional   Sexual activity: Yes    Birth control/protection: None  Other Topics Concern   Not on file  Social History Narrative   Not on file   Social Determinants of Health   Financial Resource Strain: Not on file  Food Insecurity: No Food Insecurity (11/04/2022)   Hunger Vital Sign    Worried About Running Out of Food in the Last Year: Never true    Ran Out of Food in the Last Year: Never true  Transportation Needs: No Transportation Needs (11/04/2022)   PRAPARE - Administrator, Civil Service (Medical): No  Lack of Transportation (Non-Medical): No  Physical Activity: Not on file  Stress: Not on file  Social Connections: Not on file      Review of Systems denies fever (but has had occ sweats), abd pain, back pain,vomiting or bleeding; has had occ cough/nausea/left chest discomfort; she is anxious  Vital Signs: BP 114/72   Pulse (!) 101   Temp 98.6 F (37 C) (Oral)   Resp 18   Ht 5\' 6"  (1.676 m)   Wt 142 lb 10.2 oz (64.7 kg)   LMP 10/14/2022 (Exact Date) Comment: Mirena  SpO2 100%   BMI 23.02 kg/m     Physical Exam awake/alert; chest- dim BS left base, right clear; heart- tachy but reg rhythm; abd- soft,+BS,NT;  no LE edema  Imaging: DG CHEST PORT 1 VIEW  Result Date: 11/09/2022 CLINICAL DATA:  Pleural effusion. EXAM: PORTABLE CHEST 1 VIEW COMPARISON:  11/08/2022 FINDINGS: Patient is slightly rotated to the right. Interval removal of left basilar pigtail pleural drainage catheter. Stable to slight interval worsening of large left pleural effusion with loculation over the left upper lobe/apex. No definite left apical pneumothorax. Right lung is clear. Cardiomediastinal silhouette and remainder of the exam is unchanged. IMPRESSION: Stable to slight interval worsening of large left pleural effusion with loculation over the left upper lobe/apex. Interval removal of left basilar pigtail pleural drainage catheter. No definite left pneumothorax. Electronically Signed   By: 11/10/2022 M.D.   On: 11/09/2022 08:11   DG CHEST PORT 1 VIEW  Result Date: 11/08/2022 CLINICAL DATA:  Pleural effusion. EXAM: PORTABLE CHEST 1 VIEW COMPARISON:  AP chest 11/07/2022 at 4:54 a.m. FINDINGS: Unchanged position of left basilar pigtail chest tube. Mild-to-moderate left pleural effusion with associated left midlung heterogeneous opacities, unchanged. The right lung is clear. Note is made of small left apical pneumothorax, unchanged from 11/07/2022 radiograph and better seen on most recent study of 11/07/2022 chest CT. The visualized cardiac silhouette and mediastinal contours are within normal limits. No acute skeletal abnormality. IMPRESSION: 1. Unchanged position of left basilar pigtail chest tube. 2. Left basilar lower lung opacities, representing a combination of pleural effusion, atelectasis, and left perihilar and lower lobe lung tumor better seen on yesterday's chest CT. 3. Unchanged small left apical pneumothorax. Electronically Signed   By: 11/09/2022 M.D.   On: 11/08/2022 08:23   MR BRAIN W WO CONTRAST  Result Date: 11/08/2022 CLINICAL DATA:  Non-small cell lung cancer, staging EXAM: MRI HEAD WITHOUT AND WITH CONTRAST  TECHNIQUE: Multiplanar, multiecho pulse sequences of the brain and surrounding structures were obtained without and with intravenous contrast. CONTRAST:  32mL GADAVIST GADOBUTROL 1 MMOL/ML IV SOLN COMPARISON:  None Available. FINDINGS: Brain: No abnormal parenchymal or meningeal enhancement. No restricted diffusion to suggest acute or subacute infarct. No acute hemorrhage, mass, mass effect, or midline shift. No hydrocephalus or extra-axial collection. No hemosiderin deposition to suggest remote hemorrhage. Normal pituitary. Low lying cerebellar tonsils, which extend 3 mm below the foramen magnum and do not meet criteria for Chiari I malformation. Vascular: Patent arterial flow voids. Skull and upper cervical spine: 8 mm enhancing lesions in the right frontal calvarium (series 16, image 129 and 135). Sinuses/Orbits: Clear paranasal sinuses. No acute finding in the orbits. Other: The mastoid air cells are well aerated. IMPRESSION: 1. No evidence of metastatic disease in the brain. 2. 8 mm enhancing lesions in the right frontal calvarium, concerning for osseous metastatic disease in the setting of primary lung cancer. Electronically Signed   By:  Merilyn Baba M.D.   On: 11/08/2022 01:32   CT CHEST ABDOMEN PELVIS W CONTRAST  Result Date: 11/07/2022 CLINICAL DATA:  Non-small cell lung cancer staging * Tracking Code: BO * EXAM: CT CHEST, ABDOMEN, AND PELVIS WITH CONTRAST TECHNIQUE: Multidetector CT imaging of the chest, abdomen and pelvis was performed following the standard protocol during bolus administration of intravenous contrast. RADIATION DOSE REDUCTION: This exam was performed according to the departmental dose-optimization program which includes automated exposure control, adjustment of the mA and/or kV according to patient size and/or use of iterative reconstruction technique. CONTRAST:  143mL OMNIPAQUE IOHEXOL 300 MG/ML  SOLN COMPARISON:  11/04/2022 CT chest, and CT abdomen from 10/09/2011 FINDINGS: CT  CHEST FINDINGS Cardiovascular: Suspected pericardial effusion especially along the cardiac apex, potentially with associated complexity as on image 44 series 2. Mediastinum/Nodes: Bulky but indistinctly marginated right and left paratracheal, AP window, prevascular, left internal mammary, subcarinal, left hilar, and left infrahilar adenopathy. Index right paratracheal node 1.9 cm in short axis, image 22 series 2. Index left internal mammary node 1.4 cm in short axis, image 24 series 2. The hilar and infrahilar adenopathy is somewhat conglomerate and difficult to individually measure. AP window lymph node 1.4 cm in short axis, image 24 series 2. Lungs/Pleura: Substantially reduced size of the left pleural effusion with a left-sided chest tube in place. Scattered loculations of gas in the left pleural space along with loculations of fluid along the major fissure and posteriorly. The left lower lobe bronchus is essentially occluded by central adenopathy in tumor, with the left upper lobe bronchus narrowed due to surrounding adenopathy. Complex appearance at the left lung base likely due to a combination of tumor in atelectatic lung for example on image 69 series 4. Suspected extensive tumor versus adherent atelectatic lung along the left diaphragm and pleural surface. Irregular deposition of tumor posteriorly along the pleural and diaphragmatic margin for example on image 95 series 4. It is difficult to confidently measure tumor separate from atelectatic lung but suspected tumor along the diaphragmatic surface measures about 9.9 by 6.4 cm on image 47 series 2. Secondary pulmonary lobular interstitial accentuation throughout the left lung. Small left apical hydropneumothorax with air-fluid level. Scattered ground-glass opacity in the aerated portions of the left lower lobe. Right apical pleuroparenchymal scarring. Right middle lobe nodule 0.9 by 0.6 cm, image 103 series 6. In the right lower lobe there are a few nodules  measuring up to about 5 mm in diameter which could be inflammatory or metastatic. Musculoskeletal: Unremarkable CT ABDOMEN PELVIS FINDINGS Hepatobiliary: Unremarkable Pancreas: Unremarkable Spleen: Unremarkable Adrenals/Urinary Tract: No adrenal mass. The kidneys appear unremarkable. There is contrast medium in the collecting systems which could obscure nonobstructive calculi, but there is no hydronephrosis or hydroureter. Stomach/Bowel: Prominent stool throughout the colon favors constipation. Vascular/Lymphatic: A right gastric lymph node measures 10 mm in short axis on image 56 series 2. Reproductive: An IUD is satisfactorily positioned along the endometrium. Adnexa unremarkable. Other: There is a small amount of right eccentric free pelvic fluid in the cul-de-sac on image 103 series 2, possibly physiologic but technically nonspecific. No well-defined nodularity along the omentum or peritoneal surfaces. Musculoskeletal: Mild edema tracks along the left lateral abdominal wall musculature, nonspecific and possibly related to the left chest tube. No specific indicators of osseous metastatic disease. IMPRESSION: 1. Bulky mediastinal, left hilar, and left infrahilar adenopathy compatible with malignancy. The left lower lobe bronchus is occluded and the left upper lobe bronchus is narrowed due to surrounding adenopathy.  There is a large amount of tumor along the left pleural surface and diaphragm, along with additional pleural deposits of tumor, and a loculated left hydropneumothorax with chest tube in place. 2. There is also a 9 by 6 mm right middle lobe nodule and a few small nodules in the right lower lobe which could be inflammatory or metastatic. 3. Secondary pulmonary lobular interstitial accentuation in the left lung, probably from lymphangitic spread of tumor or edema. 4. Suspected pericardial effusion especially along the cardiac apex, potentially with associated complexity. Malignant pericardial effusion not  excluded. 5. Small amount of free pelvic fluid in the cul-de-sac, possibly physiologic but technically nonspecific. 6. Prominent stool throughout the colon favors constipation. 7. Mild edema along the left lateral abdominal wall musculature, nonspecific and possibly related to the left chest tube. 8. The only finding of potential extra thoracic malignant involvement is a mildly enlarged right gastric lymph node (1.0 cm in diameter). Electronically Signed   By: Gaylyn Rong M.D.   On: 11/07/2022 21:54   DG CHEST PORT 1 VIEW  Result Date: 11/07/2022 CLINICAL DATA:  Pleural effusion on left. EXAM: PORTABLE CHEST 1 VIEW COMPARISON:  AP chest 11/06/2022, CT chest 11/04/2022 FINDINGS: Redemonstration of left basilar pigtail chest tube. Mild interval improvement in now mild-to-moderate left pleural effusion, previously extending 60% up the left hemithorax and now extending 30-40% up the left hemithorax. Associated left midlung linear densities. Minimal right costophrenic angle linear subsegmental atelectasis. No pneumothorax. Visualized cardiac silhouette and mediastinal contours are within normal limits. No acute skeletal abnormality. IMPRESSION: Redemonstration of left basilar pigtail chest tube. Mild interval improvement in now mild-to-moderate left pleural effusion Electronically Signed   By: Neita Garnet M.D.   On: 11/07/2022 08:29   DG CHEST PORT 1 VIEW  Result Date: 11/06/2022 CLINICAL DATA:  Chest tube placement.  [id number] EXAM: PORTABLE CHEST 1 VIEW COMPARISON:  11/06/2022 at 4:18 a.m. FINDINGS: There is a large left pleural effusion with basilar atelectasis or consolidation. Similar appearance to prior study. A left chest tube has been placed in the lower chest region. The right lung is clear. Heart size is obscured by the left pleural/parenchymal process. No pneumothorax. IMPRESSION: Large left pleural effusion with infiltration or atelectasis in the left base. Interval placement of a left chest  tube. No pneumothorax. Electronically Signed   By: Burman Nieves M.D.   On: 11/06/2022 17:49   DG CHEST PORT 1 VIEW  Result Date: 11/06/2022 CLINICAL DATA:  Follow-up pleural effusion. EXAM: PORTABLE CHEST 1 VIEW COMPARISON:  11/05/22 FINDINGS: Stable cardiomediastinal contours. There is a large left pleural effusion which appears unchanged in volume from the previous exam. Right lung appears clear. No new findings. IMPRESSION: No change in large left pleural effusion. Electronically Signed   By: Signa Kell M.D.   On: 11/06/2022 08:15   DG CHEST PORT 1 VIEW  Result Date: 11/05/2022 CLINICAL DATA:  Post thoracentesis. EXAM: PORTABLE CHEST 1 VIEW COMPARISON:  Chest x-ray from earlier same day. FINDINGS: Decreased size of the LEFT pleural effusion status post thoracentesis. Large opacity persists at the LEFT lung base, likely residual pleural effusion and/or atelectasis/airspace collapse. RIGHT lung is clear. No pneumothorax is seen. Heart size and mediastinal contours are grossly stable. IMPRESSION: 1. Decreased size of the LEFT pleural effusion status post thoracentesis. No pneumothorax. 2. Large opacity persists at the LEFT lung base, likely residual pleural effusion and/or atelectasis/airspace collapse. Electronically Signed   By: Bary Richard M.D.   On: 11/05/2022 13:24  DG CHEST PORT 1 VIEW  Result Date: 11/05/2022 CLINICAL DATA:  Short of breath EXAM: PORTABLE CHEST 1 VIEW COMPARISON:  11/04/2022 FINDINGS: LEFT cardiac silhouette is obscured large LEFT effusion. LEFT pleural effusion occupies 75% of LEFT hemithorax and is increased comparison exam. RIGHT lung clear. No pneumothorax. IMPRESSION: Increasing large LEFT effusion occupying 75% of the LEFT hemithorax. Electronically Signed   By: Genevive Bi M.D.   On: 11/05/2022 10:14   CT Angio Chest PE W and/or Wo Contrast  Result Date: 11/04/2022 CLINICAL DATA:  Pneumonia and worsening shortness of breath EXAM: CT ANGIOGRAPHY CHEST WITH  CONTRAST TECHNIQUE: Multidetector CT imaging of the chest was performed using the standard protocol during bolus administration of intravenous contrast. Multiplanar CT image reconstructions and MIPs were obtained to evaluate the vascular anatomy. RADIATION DOSE REDUCTION: This exam was performed according to the departmental dose-optimization program which includes automated exposure control, adjustment of the mA and/or kV according to patient size and/or use of iterative reconstruction technique. CONTRAST:  64mL OMNIPAQUE IOHEXOL 350 MG/ML SOLN COMPARISON:  Chest radiograph dated 10/30/2022 FINDINGS: Cardiovascular: The study is high quality for the evaluation of pulmonary embolism. There are no filling defects in the central, lobar, segmental or subsegmental pulmonary artery branches to suggest acute pulmonary embolism. Great vessels are normal in course and caliber. Normal heart size. No significant pericardial fluid/thickening. Mediastinum/Nodes: Imaged thyroid gland without nodules meeting criteria for imaging follow-up by size. Rightward shift of the mediastinum. Normal esophagus. Multi station lymphadenopathy including 1.3 cm left internal mammary lymph node(5:98), 2.0 cm subcarinal lymph node (4:59), and 1.4 cm prevascular lymph node (4:51). Lungs/Pleura: The central airways are patent. Near-complete atelectasis of the lingula and left lower lobe with heterogeneous consolidation resulting in marked narrowing of the left sided airways. Dependent ground-glass opacity in the left upper lobe. No pneumothorax. Large left pleural effusion. Upper abdomen: Normal. Musculoskeletal: No acute or abnormal lytic or blastic osseous lesions. Review of the MIP images confirms the above findings. IMPRESSION: 1. No acute pulmonary embolism. 2. Near-complete atelectasis of the lingula and left lower lobe with heterogeneous consolidation resulting in marked narrowing of the left sided airways, likely multifocal pneumonia. 3.  Large left pleural effusion. 4. Multi station mediastinal lymphadenopathy likely reactive Electronically Signed   By: Agustin Cree M.D.   On: 11/04/2022 14:51   DG Chest Port 1 View  Result Date: 11/04/2022 CLINICAL DATA:  Provided history: Left chest pain. EXAM: PORTABLE CHEST 1 VIEW COMPARISON:  Prior chest radiograph 10/30/2022. FINDINGS: Significant interval increase in size of a left pleural effusion, now moderate-to-large. Underlying atelectasis and/or airspace consolidation within the mid and lower left lung, also significantly progressed. No appreciable airspace consolidation on the right. Portions of the cardiac silhouette are obscured on the left, limiting evaluation of heart size. No evidence of pneumothorax. No acute bony abnormality identified. IMPRESSION: Significant interval increase in size of a left pleural effusion, now moderate-to-large. Underlying atelectasis and/or airspace consolidation within the mid and lower left lung has also significantly progressed from the prior examination of 10/30/2022. Electronically Signed   By: Jackey Loge D.O.   On: 11/04/2022 12:22   DG Chest Portable 1 View  Result Date: 10/30/2022 CLINICAL DATA:  Cough, headache, fatigue, night sweats. EXAM: PORTABLE CHEST 1 VIEW COMPARISON:  Overlapping portions CT abdomen 10/09/2011 FINDINGS: Left lower lobe and possible lingular consolidation. Pleural thickening along the left lateral costophrenic angle may reflect adjacent pleural effusion. The right lung appears clear. Cardiac and mediastinal margins appear normal. IMPRESSION:  1. Left lower lobe and possible lingular consolidation with small left pleural effusion. Findings favor left basilar pneumonia over atelectasis. Followup PA and lateral chest X-ray is recommended in 3-4 weeks following trial of antibiotic therapy to ensure resolution and exclude the unlikely possibility of underlying malignancy. Electronically Signed   By: Gaylyn Rong M.D.   On:  10/30/2022 10:54    Labs:  CBC: Recent Labs    11/06/22 0432 11/07/22 0429 11/08/22 0428 11/09/22 0513  WBC 15.3* 15.0* 14.1* 14.6*  HGB 11.4* 12.8 12.5 11.8*  HCT 34.7* 39.6 38.6 37.0  PLT 314 323 364 376    COAGS: Recent Labs    11/05/22 1110  INR 1.2    BMP: Recent Labs    11/06/22 0432 11/07/22 0429 11/08/22 0428 11/09/22 0513  NA 136 136 139 136  K 3.7 4.3 3.9 3.7  CL 104 101 103 103  CO2 26 27 25 24   GLUCOSE 118* 106* 104* 99  BUN <5* <5* <5* <5*  CALCIUM 8.0* 8.2* 8.2* 8.1*  CREATININE 0.65 0.51 0.50 0.43*  GFRNONAA >60 >60 >60 >60    LIVER FUNCTION TESTS: Recent Labs    11/06/22 0432 11/07/22 0429 11/08/22 0428 11/09/22 0513  BILITOT 0.7 0.7 0.7 0.6  AST 12* 12* 11* 12*  ALT 14 13 12 12   ALKPHOS 50 49 50 49  PROT 5.6* 5.8* 6.0* 5.6*  ALBUMIN 2.7* 2.6* 2.6* 2.3*    TUMOR MARKERS: No results for input(s): "AFPTM", "CEA", "CA199", "CHROMGRNA" in the last 8760 hours.  Assessment and Plan: 37 y.o. female with past medical history of anxiety, left lower lobe pneumonia and malignant left pleural effusion, stage IVa non-small cell lung cancer, recent thoracentesis with chest drain placement 1/17 by CCM followed by chest drain removal yesterday (due to minimal output) who presents now with recurrent left pleural effusion. Request now received from CCM for left Pleurx catheter placement.  Latest imaging studies have been reviewed by Dr. 31. Risks and benefits discussed with the patient/mother including bleeding, infection, damage to adjacent structures/pneumothorax, and sepsis.  All of the patient's questions were answered, patient is agreeable to proceed. Consent signed and in chart. Procedure scheduled for today.   Pt afebrile, WBC 14.6, hgb 11.8, plts nl, creat 0.43, PT/INR nl, pleural fl cx neg to date; not anticoagulated  Thank you for this interesting consult.  I greatly enjoyed meeting Melissa Pineda and look forward to participating in their  care.  A copy of this report was sent to the requesting provider on this date.  Electronically Signed: D. Lowella Dandy, PA-C 11/09/2022, 9:50 AM   I spent a total of 30 minutes    in face to face in clinical consultation, greater than 50% of which was counseling/coordinating care for left Pleurx catheter placement

## 2022-11-09 NOTE — Procedures (Signed)
Interventional Radiology Procedure:   Indications: Symptomatic malignant left pleural effusion  Procedure: Placement of tunneled left pleural catheter  Findings: Pleurx placed in left pleural space.  Catheter placed in largest pocket.  Removed approximately 600 ml of brown fluid (will send for cytology).  Very loculated left pleural fluid with other collections.   Complications: No immediate complications noted.     EBL: Minimal  Plan: Return to inpatient floor.  Bedrest 1 hour.  CXR ordered and fluid sent for cytology.    Melissa Pineda R. Lowella Dandy, MD  Pager: [phone]

## 2022-11-09 NOTE — Progress Notes (Signed)
PROGRESS NOTE   Melissa Pineda  WUJ:[id number] DOB: 1986-04-30 DOA: 11/04/2022 PCP: Patient, No Pcp Per   Brief Narrative:  37 year old Caucasian known anxiety She first developed this 8 weeks prior, went to urgent care, received steroids 12/29 at [facility], then when she stopped the steroids, and felt worse , was diagnosed with a pneumonia 10/30/22 in ER and was discharged home on doxycycline and an albuterol inhaler.    returned to the ED 1/15 with worsening shortness of breath, cough, as well as fever.  +  Pleuritic chest pain that was 9 out of 10 in severity and left-sided in the ED  found to have a lingular pneumonia with near complete opacification of the area with a large left-sided pleural effusion.   also reactive lymphadenopathy and she is admitted to the hospital with community-acquired pneumonia and failed outpatient treatment as well as a left-sided pleural effusion.    Chest x-ray --worsening of her pleural effusion   1/16 pulmonary was consulted and they did a bedside thoracentesis which drained 1.9 L of blood-tinged cloudy fluid.  1/17 pigtail catheter placed after rapid reaccumulation of fluid 1/18 pathology shows adenocarcinoma lung oncology consulted 1/19 chest tube removed  Assessment and Plan:  Likely stage IV adenocarcinoma lung -Initial thought process was that patient had an infectious etiology but with rapid recurrence, lymphocytic predominance, and now positive cytology this is unlikely -d/w family 1/18 , with oncologist in the room Dr. 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 - Chest tube has been pulled on 1/19, but accumulated rapidly -PleurX catheter placed on 1/20 with 600 cc being drained on 1/20 - teaching needs to be done before patient can comfortably discharge  home  Sinusitis - Added Sudafed on 1/20 - Continue Claritin 5 daily as well as saline Ocean Spray  Anxiety Disorder -Not on any Meds PTA -Had to be given 1 mg of Alprazolam po x1, started Xanax 0.5 3 times daily and adjust  -Lexapro ordered and will continue  Hyponatremia -resolved  Leukocytosis -Probably related to underlying malignancy-would not further check and has received 1 to 2 days of therapy  Normocytic Anemia, iron level 22 saturations 8 -Treat with iron as an outpatient -Start iron on discharge  DVT prophylaxis: SCDs Start: 11/04/22 2336    Code Status: Full Code Family Communication: discussed the case with her husband as well as mother at the bedside, aunt over the telephone 1/19  Disposition Plan:  Level of care: Med-Surg Status is: Inpatient Remains inpatient appropriate because:   Further chest tube planning and discussion about removal of the same    Consultants:  Pulmonary  Procedures:  As delineated above and she had a thoracentesis  Antimicrobials:     Subjective:  Moderate to severe headaches overnight-just back from IR and getting the drain placed No fever No chest pain Facial pain and discomfort noted A little sleepy overall coherent Chest is clear She is a little constipated but wants to wait to take a laxative    Objective: Vitals:   11/09/22 1215 11/09/22 1220 11/09/22 1226 11/09/22 1255  BP: (!) 107/59 (!) 106/58 105/64 96/83  Pulse: (!) 101 (!) 101 (!) 103 92  Resp: (!) 27 (!) 29 (!) 27 18  Temp:    98.3 F (36.8 C)  TempSrc:    Oral  SpO2: 100% 97% 97% 97%  Weight:      Height:        Intake/Output Summary (Last 24 hours) at 11/09/2022 1450 Last data filed at 11/09/2022 1146 Gross per 24 hour  Intake 814.61 ml  Output --  Net 814.61 ml    Filed Weights   11/05/22 1721 11/09/22 0552  Weight: 62.1 kg 64.7 kg    Examination: Physical Exam:  A little sleepy overall coherent Decreased air entry left lung fields  and bases posterolaterally S1-S2 no murmur Abdomen soft No lower extremity edema   Data Reviewed: I have personally reviewed following labs and imaging studies  CBC: Recent Labs  Lab 11/04/22 1153 11/05/22 0419 11/06/22 0432 11/07/22 0429 11/08/22 0428 11/09/22 0513  WBC 16.6* 15.3* 15.3* 15.0* 14.1* 14.6*  NEUTROABS 13.5*  --  11.3*  --   --   --   HGB 13.1 11.3* 11.4* 12.8 12.5 11.8*  HCT 39.1 35.5* 34.7* 39.6 38.6 37.0  MCV 88.1 91.0 89.0 89.6 89.4 91.1  PLT 347 311 314 323 364 376    Basic Metabolic Panel: Recent Labs  Lab 11/04/22 1153 11/05/22 0419 11/05/22 0425 11/06/22 0432 11/07/22 0429 11/08/22 0428 11/09/22 0513  NA 134* 136  --  136 136 139 136  K 3.7 3.7  --  3.7 4.3 3.9 3.7  CL 100 103  --  104 101 103 103  CO2 24 23  --  26 27 25 24   GLUCOSE 145* 142*  --  118* 106* 104* 99  BUN 7 5*  --  <5* <5* <5* <5*  CREATININE 0.56 0.49  --  0.65 0.51 0.50 0.43*  CALCIUM 9.0 8.1*  --  8.0* 8.2* 8.2* 8.1*  MG 1.8  --  1.9 1.8  --   --   --   PHOS 2.1*  --  2.5 2.9  --   --   --     GFR: Estimated Creatinine Clearance: 91.9 mL/min (A) (by C-G formula based on SCr of 0.43 mg/dL (L)). Liver Function Tests: Recent Labs  Lab 11/05/22 0419 11/06/22 0432 11/07/22 0429 11/08/22 0428 11/09/22 0513  AST 19 12* 12* 11* 12*  ALT 14 14 13 12 12   ALKPHOS 56 50 49 50 49  BILITOT 0.5 0.7 0.7 0.7 0.6  PROT 6.1* 5.6* 5.8* 6.0* 5.6*  ALBUMIN 3.1* 2.7* 2.6* 2.6* 2.3*    Recent Labs  Lab 11/04/22 1153  LIPASE <10*    No results for input(s): "AMMONIA" in the last 168 hours. Coagulation Profile: Recent Labs  Lab 11/05/22 1110  INR 1.2    Cardiac Enzymes: No results for input(s): "CKTOTAL", "CKMB", "CKMBINDEX", "TROPONINI" in the last 168 hours. BNP (last 3 results) No results for input(s): "PROBNP" in the last 8760 hours. HbA1C: No results for input(s): "HGBA1C" in the last 72 hours. CBG: No results for input(s): "GLUCAP" in the last 168  hours. Lipid Profile: No results for input(s): "CHOL", "HDL", "LDLCALC", "TRIG", "CHOLHDL", "LDLDIRECT" in the last 72 hours. Thyroid Function Tests: No results for input(s): "TSH", "T4TOTAL", "FREET4", "T3FREE", "THYROIDAB" in the last 72 hours. Anemia Panel: No results for input(s): "VITAMINB12", "FOLATE", "FERRITIN", "TIBC", "IRON", "RETICCTPCT" in the last 72 hours.  Sepsis Labs: No results for input(s): "PROCALCITON", "LATICACIDVEN" in the last 168 hours.  Recent Results (from the past 240 hour(s))  Blood culture (routine x 2)     Status: None   Collection Time: 11/04/22  1:20 PM   Specimen: BLOOD RIGHT HAND  Result Value Ref Range Status  Specimen Description   Final    BLOOD RIGHT HAND Performed at [facility], [address] [place], [place] [address]    Special Requests   Final    BOTTLES DRAWN AEROBIC AND ANAEROBIC Blood Culture adequate volume Performed at [facility], [address] [place], [place] [address]    Culture   Final    NO GROWTH 5 DAYS Performed at [facility], [address], [place], [place] [address]    Report Status 11/09/2022 FINAL  Final  Blood culture (routine x 2)     Status: None   Collection Time: 11/04/22  1:40 PM   Specimen: BLOOD  Result Value Ref Range Status   Specimen Description   Final    BLOOD LEFT ANTECUBITAL Performed at [facility], [address] [place], [place] [address]    Special Requests   Final    Blood Culture adequate volume BOTTLES DRAWN AEROBIC AND ANAEROBIC Performed at [facility], [address], [place], [place] [address]    Culture   Final    NO GROWTH 5 DAYS Performed at [facility], [address], [place], [place] [address]    Report Status 11/09/2022 FINAL  Final  Respiratory (~20 pathogens) panel by PCR     Status: None   Collection Time: 11/05/22 10:36 AM   Specimen: Nasopharyngeal Swab; Respiratory   Result Value Ref Range Status   Adenovirus NOT DETECTED NOT DETECTED Final   Coronavirus 229E NOT DETECTED NOT DETECTED Final    Comment: (NOTE) The Coronavirus on the Respiratory Panel, DOES NOT test for the novel  Coronavirus (2019 nCoV)    Coronavirus HKU1 NOT DETECTED NOT DETECTED Final   Coronavirus NL63 NOT DETECTED NOT DETECTED Final   Coronavirus OC43 NOT DETECTED NOT DETECTED Final   Metapneumovirus NOT DETECTED NOT DETECTED Final   Rhinovirus / Enterovirus NOT DETECTED NOT DETECTED Final   Influenza A NOT DETECTED NOT DETECTED Final   Influenza B NOT DETECTED NOT DETECTED Final   Parainfluenza Virus 1 NOT DETECTED NOT DETECTED Final   Parainfluenza Virus 2 NOT DETECTED NOT DETECTED Final   Parainfluenza Virus 3 NOT DETECTED NOT DETECTED Final   Parainfluenza Virus 4 NOT DETECTED NOT DETECTED Final   Respiratory Syncytial Virus NOT DETECTED NOT DETECTED Final   Bordetella pertussis NOT DETECTED NOT DETECTED Final   Bordetella Parapertussis NOT DETECTED NOT DETECTED Final   Chlamydophila pneumoniae NOT DETECTED NOT DETECTED Final   Mycoplasma pneumoniae NOT DETECTED NOT DETECTED Final    Comment: Performed at [facility], [address], [place], [place] [address]  Body fluid culture w Gram Stain     Status: None   Collection Time: 11/05/22  1:00 PM   Specimen: Pleural Fluid  Result Value Ref Range Status   Specimen Description   Final    PLEURAL Performed at [facility], [address] [place], [place] [address]    Special Requests   Final    NONE Performed at [facility], [address] [place], [place] [address]    Gram Stain   Final    FEW WBC PRESENT, PREDOMINANTLY PMN NO ORGANISMS SEEN    Culture   Final    NO GROWTH 3 DAYS Performed at [facility], [address] [place], [place] [address]    Report Status 11/09/2022 FINAL  Final  Fungus Culture With Stain     Status: None (Preliminary  result)    Collection Time: 11/05/22  1:00 PM   Specimen: Pleural Fluid  Result Value Ref Range Status   Fungus Stain Final report  Final    Comment: (NOTE) Performed At: [facility] [address] [place], [place] [id number] Jolene Schimke MD YY:[id number]    Fungus (Mycology) Culture PENDING  Incomplete   Fungal Source PLEURAL  Final    Comment: Performed at [facility], [address] [place], [place] [address]  Fungus Culture Result     Status: None   Collection Time: 11/05/22  1:00 PM  Result Value Ref Range Status   Result 1 Comment  Final    Comment: (NOTE) KOH/Calcofluor preparation:  no fungus observed. Performed At: [facility] [address] [place], [place] [id number] Jolene Schimke MD GB:[id number]     Radiology Studies: [facility] Chest Port 1 View  Result Date: 11/09/2022 CLINICAL DATA:  Non-small-cell lung cancer.  Drain placement. EXAM: PORTABLE CHEST 1 VIEW COMPARISON:  11/09/2022, earlier same day FINDINGS: Diffuse interstitial opacity in the left lung is similar to prior with persistent left base collapse/consolidation and moderate left pleural effusion. Hazy opacity over the left apex may represent fluid in the fissure or loculated posterior pleural effusions seen on previous CT imaging. Left pleural drain is new in the interval. Right lung remains clear. Cardiopericardial silhouette is at upper limits of normal for size. The visualized bony structures of the thorax are unremarkable. IMPRESSION: Interval placement of left pleural drain with persistent left base collapse/consolidation and moderate left pleural effusion. Electronically Signed   By: Kennith Center M.D.   On: 11/09/2022 12:55   [place] CHEST (PLEURAL EFFUSION)  Result Date: 11/09/2022 CLINICAL DATA:  Evaluate pleural effusion. EXAM: CHEST ULTRASOUND COMPARISON:  Chest radiograph 11/09/2022 FINDINGS: There is a moderate amount of complicated fluid demonstrated within the left chest  compatible with pleural effusion. No significant right pleural effusion identified. IMPRESSION: Moderate left pleural effusion. Electronically Signed   By: Annia Belt M.D.   On: 11/09/2022 09:51   DG CHEST PORT 1 VIEW  Result Date: 11/09/2022 CLINICAL DATA:  Pleural effusion. EXAM: PORTABLE CHEST 1 VIEW COMPARISON:  11/08/2022 FINDINGS: Patient is slightly rotated to the right. Interval removal of left basilar pigtail pleural drainage catheter. Stable to slight interval worsening of large left pleural effusion with loculation over the left upper lobe/apex. No definite left apical pneumothorax. Right lung is clear. Cardiomediastinal silhouette and remainder of the exam is unchanged. IMPRESSION: Stable to slight interval worsening of large left pleural effusion with loculation over the left upper lobe/apex. Interval removal of left basilar pigtail pleural drainage catheter. No definite left pneumothorax. Electronically Signed   By: Elberta Fortis M.D.   On: 11/09/2022 08:11   DG CHEST PORT 1 VIEW  Result Date: 11/08/2022 CLINICAL DATA:  Pleural effusion. EXAM: PORTABLE CHEST 1 VIEW COMPARISON:  AP chest 11/07/2022 at 4:54 a.m. FINDINGS: Unchanged position of left basilar pigtail chest tube. Mild-to-moderate left pleural effusion with associated left midlung heterogeneous opacities, unchanged. The right lung is clear. Note is made of small left apical pneumothorax, unchanged from 11/07/2022 radiograph and better seen on most recent study of 11/07/2022 chest CT. The visualized cardiac silhouette and mediastinal contours are within normal limits. No acute skeletal abnormality. IMPRESSION: 1. Unchanged position of left basilar pigtail chest tube. 2. Left basilar lower lung opacities, representing a combination of pleural effusion, atelectasis, and left perihilar and lower lobe lung tumor better seen on yesterday's chest CT. 3. Unchanged  small left apical pneumothorax. Electronically Signed   By: Neita Garnet M.D.    On: 11/08/2022 08:23   MR BRAIN W WO CONTRAST  Result Date: 11/08/2022 CLINICAL DATA:  Non-small cell lung cancer, staging EXAM: MRI HEAD WITHOUT AND WITH CONTRAST TECHNIQUE: Multiplanar, multiecho pulse sequences of the brain and surrounding structures were obtained without and with intravenous contrast. CONTRAST:  55mL GADAVIST GADOBUTROL 1 MMOL/ML IV SOLN COMPARISON:  None Available. FINDINGS: Brain: No abnormal parenchymal or meningeal enhancement. No restricted diffusion to suggest acute or subacute infarct. No acute hemorrhage, mass, mass effect, or midline shift. No hydrocephalus or extra-axial collection. No hemosiderin deposition to suggest remote hemorrhage. Normal pituitary. Low lying cerebellar tonsils, which extend 3 mm below the foramen magnum and do not meet criteria for Chiari I malformation. Vascular: Patent arterial flow voids. Skull and upper cervical spine: 8 mm enhancing lesions in the right frontal calvarium (series 16, image 129 and 135). Sinuses/Orbits: Clear paranasal sinuses. No acute finding in the orbits. Other: The mastoid air cells are well aerated. IMPRESSION: 1. No evidence of metastatic disease in the brain. 2. 8 mm enhancing lesions in the right frontal calvarium, concerning for osseous metastatic disease in the setting of primary lung cancer. Electronically Signed   By: Wiliam Ke M.D.   On: 11/08/2022 01:32   CT CHEST ABDOMEN PELVIS W CONTRAST  Result Date: 11/07/2022 CLINICAL DATA:  Non-small cell lung cancer staging * Tracking Code: BO * EXAM: CT CHEST, ABDOMEN, AND PELVIS WITH CONTRAST TECHNIQUE: Multidetector CT imaging of the chest, abdomen and pelvis was performed following the standard protocol during bolus administration of intravenous contrast. RADIATION DOSE REDUCTION: This exam was performed according to the departmental dose-optimization program which includes automated exposure control, adjustment of the mA and/or kV according to patient size and/or use of  iterative reconstruction technique. CONTRAST:  OMNIPAQUE IOHEXOL 300 MG/ML  SOLN COMPARISON:  11/04/2022 CT chest, and CT abdomen from 10/09/2011 FINDINGS: CT CHEST FINDINGS Cardiovascular: Suspected pericardial effusion especially along the cardiac apex, potentially with associated complexity as on image 44 series 2. Mediastinum/Nodes: Bulky but indistinctly marginated right and left paratracheal, AP window, prevascular, left internal mammary, subcarinal, left hilar, and left infrahilar adenopathy. Index right paratracheal node 1.9 cm in short axis, image 22 series 2. Index left internal mammary node 1.4 cm in short axis, image 24 series 2. The hilar and infrahilar adenopathy is somewhat conglomerate and difficult to individually measure. AP window lymph node 1.4 cm in short axis, image 24 series 2. Lungs/Pleura: Substantially reduced size of the left pleural effusion with a left-sided chest tube in place. Scattered loculations of gas in the left pleural space along with loculations of fluid along the major fissure and posteriorly. The left lower lobe bronchus is essentially occluded by central adenopathy in tumor, with the left upper lobe bronchus narrowed due to surrounding adenopathy. Complex appearance at the left lung base likely due to a combination of tumor in atelectatic lung for example on image 69 series 4. Suspected extensive tumor versus adherent atelectatic lung along the left diaphragm and pleural surface. Irregular deposition of tumor posteriorly along the pleural and diaphragmatic margin for example on image 95 series 4. It is difficult to confidently measure tumor separate from atelectatic lung but suspected tumor along the diaphragmatic surface measures about 9.9 by 6.4 cm on image 47 series 2. Secondary pulmonary lobular interstitial accentuation throughout the left lung. Small left apical hydropneumothorax with air-fluid level. Scattered ground-glass opacity in the aerated  portions of the  left lower lobe. Right apical pleuroparenchymal scarring. Right middle lobe nodule 0.9 by 0.6 cm, image 103 series 6. In the right lower lobe there are a few nodules measuring up to about 5 mm in diameter which could be inflammatory or metastatic. Musculoskeletal: Unremarkable CT ABDOMEN PELVIS FINDINGS Hepatobiliary: Unremarkable Pancreas: Unremarkable Spleen: Unremarkable Adrenals/Urinary Tract: No adrenal mass. The kidneys appear unremarkable. There is contrast medium in the collecting systems which could obscure nonobstructive calculi, but there is no hydronephrosis or hydroureter. Stomach/Bowel: Prominent stool throughout the colon favors constipation. Vascular/Lymphatic: A right gastric lymph node measures 10 mm in short axis on image 56 series 2. Reproductive: An IUD is satisfactorily positioned along the endometrium. Adnexa unremarkable. Other: There is a small amount of right eccentric free pelvic fluid in the cul-de-sac on image 103 series 2, possibly physiologic but technically nonspecific. No well-defined nodularity along the omentum or peritoneal surfaces. Musculoskeletal: Mild edema tracks along the left lateral abdominal wall musculature, nonspecific and possibly related to the left chest tube. No specific indicators of osseous metastatic disease. IMPRESSION: 1. Bulky mediastinal, left hilar, and left infrahilar adenopathy compatible with malignancy. The left lower lobe bronchus is occluded and the left upper lobe bronchus is narrowed due to surrounding adenopathy. There is a large amount of tumor along the left pleural surface and diaphragm, along with additional pleural deposits of tumor, and a loculated left hydropneumothorax with chest tube in place. 2. There is also a 9 by 6 mm right middle lobe nodule and a few small nodules in the right lower lobe which could be inflammatory or metastatic. 3. Secondary pulmonary lobular interstitial accentuation in the left lung, probably from lymphangitic  spread of tumor or edema. 4. Suspected pericardial effusion especially along the cardiac apex, potentially with associated complexity. Malignant pericardial effusion not excluded. 5. Small amount of free pelvic fluid in the cul-de-sac, possibly physiologic but technically nonspecific. 6. Prominent stool throughout the colon favors constipation. 7. Mild edema along the left lateral abdominal wall musculature, nonspecific and possibly related to the left chest tube. 8. The only finding of potential extra thoracic malignant involvement is a mildly enlarged right gastric lymph node (1.0 cm in diameter). Electronically Signed   By: Gaylyn Rong M.D.   On: 11/07/2022 21:54    Scheduled Meds:  escitalopram  10 mg Oral Daily   guaiFENesin  1,200 mg Oral BID   ibuprofen  400 mg Oral QID   lidocaine  20 mL Intradermal Once   loratadine  5 mg Oral Daily   pseudoephedrine  120 mg Oral BID   Continuous Infusions:  sodium chloride Stopped (11/04/22 1705)    ceFAZolin (ANCEF) IV Stopped (11/09/22 1142)    LOS: 5 days   Pleas Koch, MD [facility] 2:50 PM  [facility] Available via Epic secure chat 7am-7pm After these hours, please refer to coverage provider listed on [email or web address] 11/09/2022, 2:50 PM

## 2022-11-09 NOTE — Plan of Care (Signed)

## 2022-11-09 NOTE — TOC Initial Note (Addendum)
Transition of Care [facility]) - Initial/Assessment Note    Patient Details  Name: Melissa Pineda MRN: [id number] Date of Birth: 06-23-1986  Transition of Care [facility]) CM/SW Contact:    Adrian Prows, RN Phone Number: 11/09/2022, 3:10 PM  Clinical Narrative:                 [facility] consult for d/c planning; spoke w/ pt and mother in room; pt says she is from home and she plans to return at d/c; she identified her spouse POC [name] [phone]), and mother [name] [phone]); she has transportation; pt denies IPV, food insecurity, and problems paying her utilities; she says she has glasses for distance but no dentures or HAs; pt says she does not have DME, HH services, or home oxygen; she agrees to [facility] and she does not have an agency preference; spoke w/ Kandee Keen at [facility] and he says the agency can provide services; pt , husband, and mother notified; pt says she had appt at [facility] but cancelled her appt; contact info for [facility] placed in follow up  provider section of d/c instructions; spoke w/ Trey Paula, 2W/ICU RN; he will come to room for pt/family education; pleurex supplies to cover pt until delivery by [facility] will delivered to room; documents  written MD order, insurance card, face sheet) faxed to [facility] ATTN: Catheter System Specialist) [phone]; electronic confirmation received; no TOC needs.   Expected Discharge Plan: Home w Home Health Services Barriers to Discharge: Continued Medical Work up   Patient Goals and CMS Choice Patient states their goals for this hospitalization and ongoing recovery are:: home          Expected Discharge Plan and Services   Discharge Planning Services: CM Consult Post Acute Care Choice: Durable Medical Equipment, Home Health (pleurix drain) Living arrangements for the past 2 months: Single Family Home Expected Discharge Date: 11/09/22               DME Arranged: Chest tube pluerex DME Agency: Other - Comment  ([facility])       HH Arranged: RN HH Agency: [facility] Date [facility] Agency Contacted: 11/09/22 Time HH Agency Contacted: [id number] Representative spoke with at [facility] Agency: Kandee Keen  Prior Living Arrangements/Services Living arrangements for the past 2 months: Single Family Home Lives with:: Spouse Patient language and need for interpreter reviewed:: Yes Do you feel safe going back to the place where you live?: Yes      Need for Family Participation in Patient Care: Yes (Comment) Care giver support system in place?: Yes (comment)   Criminal Activity/Legal Involvement Pertinent to Current Situation/Hospitalization: No - Comment as needed  Activities of Daily Living Home Assistive Devices/Equipment: Eyeglasses ADL Screening (condition at time of admission) Patient's cognitive ability adequate to safely complete daily activities?: Yes Is the patient deaf or have difficulty hearing?: No Does the patient have difficulty seeing, even when wearing glasses/contacts?: No Does the patient have difficulty concentrating, remembering, or making decisions?: No Patient able to express need for assistance with ADLs?: No Does the patient have difficulty dressing or bathing?: No Independently performs ADLs?: Yes (appropriate for developmental age) Does the patient have difficulty walking or climbing stairs?: No Weakness of Legs: None Weakness of Arms/Hands: None  Permission Sought/Granted Permission sought to share information with : Case Manager Permission granted to share information with : Yes, Verbal Permission Granted  Share Information with NAME: Burnard Bunting, RN. CM  Emotional Assessment Appearance:: Appears stated age Attitude/Demeanor/Rapport: Gracious Affect (typically observed): Accepting Orientation: : Oriented to Self, Oriented to Place, Oriented to  Time, Oriented to Situation Alcohol / Substance Use: Not Applicable Psych Involvement: No (comment)  Admission  diagnosis:  Pleural effusion [J90] CAP (community acquired pneumonia) [J18.9] Patient Active Problem List   Diagnosis Date Noted   Adenocarcinoma of left lung, stage 4 (HCC) 11/07/2022   Malignant pleural effusion 11/07/2022   CAP (community acquired pneumonia) 11/04/2022   Pleural effusion 11/04/2022   Hyponatremia 11/04/2022   Normal labor 10/30/2021   Spontaneous vaginal delivery 10/30/2021   Influenza 12/11/2018   Anxiety 05/13/2017   PCP:  Patient, No Pcp Per Pharmacy:   [facility] [place], [place] - [address] [place] [address] Phone: [phone] Fax: [phone]  [facility] [address] [place] [address] Phone: [phone] Fax: [phone]     Social Determinants of Health (SDOH) Social History: SDOH Screenings   Food Insecurity: No Food Insecurity (11/04/2022)  Housing: Low Risk  (11/04/2022)  Transportation Needs: No Transportation Needs (11/04/2022)  Utilities: Not At Risk (11/04/2022)  Depression (PHQ2-9): Low Risk  (11/24/2020)  Tobacco Use: Low Risk  (11/04/2022)   SDOH Interventions:     Readmission Risk Interventions     No data to display

## 2022-11-09 NOTE — Progress Notes (Signed)
Teaching done on use of Pluex drain system.  Patient and family member felt comfortable with process.

## 2022-11-10 LAB — CHOLESTEROL, BODY FLUID: Cholesterol, Fluid: 75 mg/dL

## 2022-11-11 ENCOUNTER — Ambulatory Visit: Payer: BC Managed Care – PPO | Admitting: Family Medicine

## 2022-11-11 ENCOUNTER — Other Ambulatory Visit: Payer: Self-pay | Admitting: Internal Medicine

## 2022-11-11 ENCOUNTER — Encounter (HOSPITAL_COMMUNITY): Payer: Self-pay | Admitting: Pulmonary Disease

## 2022-11-11 ENCOUNTER — Encounter (HOSPITAL_COMMUNITY): Payer: Self-pay | Admitting: Radiology

## 2022-11-11 DIAGNOSIS — J910 Malignant pleural effusion: Secondary | ICD-10-CM | POA: Diagnosis not present

## 2022-11-11 DIAGNOSIS — D72829 Elevated white blood cell count, unspecified: Secondary | ICD-10-CM | POA: Diagnosis not present

## 2022-11-11 DIAGNOSIS — Z48813 Encounter for surgical aftercare following surgery on the respiratory system: Secondary | ICD-10-CM | POA: Diagnosis not present

## 2022-11-11 DIAGNOSIS — Z4801 Encounter for change or removal of surgical wound dressing: Secondary | ICD-10-CM | POA: Diagnosis not present

## 2022-11-11 DIAGNOSIS — C3490 Malignant neoplasm of unspecified part of unspecified bronchus or lung: Secondary | ICD-10-CM

## 2022-11-11 DIAGNOSIS — F419 Anxiety disorder, unspecified: Secondary | ICD-10-CM | POA: Diagnosis not present

## 2022-11-11 DIAGNOSIS — Z7951 Long term (current) use of inhaled steroids: Secondary | ICD-10-CM | POA: Diagnosis not present

## 2022-11-11 DIAGNOSIS — C3492 Malignant neoplasm of unspecified part of left bronchus or lung: Secondary | ICD-10-CM

## 2022-11-11 DIAGNOSIS — Z9181 History of falling: Secondary | ICD-10-CM | POA: Diagnosis not present

## 2022-11-11 DIAGNOSIS — D630 Anemia in neoplastic disease: Secondary | ICD-10-CM | POA: Diagnosis not present

## 2022-11-11 DIAGNOSIS — Z8701 Personal history of pneumonia (recurrent): Secondary | ICD-10-CM | POA: Diagnosis not present

## 2022-11-11 NOTE — Telephone Encounter (Signed)
Called pt but spoke with pt mother stated that pt was busy at the moment and she will have her return call

## 2022-11-12 ENCOUNTER — Inpatient Hospital Stay: Payer: BC Managed Care – PPO | Attending: Internal Medicine

## 2022-11-12 ENCOUNTER — Inpatient Hospital Stay: Payer: BC Managed Care – PPO

## 2022-11-12 ENCOUNTER — Inpatient Hospital Stay (HOSPITAL_BASED_OUTPATIENT_CLINIC_OR_DEPARTMENT_OTHER): Payer: BC Managed Care – PPO | Admitting: Internal Medicine

## 2022-11-12 ENCOUNTER — Other Ambulatory Visit: Payer: Self-pay | Admitting: Medical Oncology

## 2022-11-12 ENCOUNTER — Other Ambulatory Visit: Payer: Self-pay | Admitting: Internal Medicine

## 2022-11-12 ENCOUNTER — Telehealth: Payer: Self-pay | Admitting: Internal Medicine

## 2022-11-12 ENCOUNTER — Other Ambulatory Visit: Payer: Self-pay

## 2022-11-12 VITALS — BP 115/81 | HR 125 | Temp 97.6°F | Resp 18 | Wt 136.9 lb

## 2022-11-12 DIAGNOSIS — J329 Chronic sinusitis, unspecified: Secondary | ICD-10-CM | POA: Diagnosis not present

## 2022-11-12 DIAGNOSIS — J029 Acute pharyngitis, unspecified: Secondary | ICD-10-CM | POA: Diagnosis not present

## 2022-11-12 DIAGNOSIS — Z515 Encounter for palliative care: Secondary | ICD-10-CM

## 2022-11-12 DIAGNOSIS — Z793 Long term (current) use of hormonal contraceptives: Secondary | ICD-10-CM | POA: Diagnosis not present

## 2022-11-12 DIAGNOSIS — Z79899 Other long term (current) drug therapy: Secondary | ICD-10-CM | POA: Diagnosis not present

## 2022-11-12 DIAGNOSIS — R059 Cough, unspecified: Secondary | ICD-10-CM | POA: Diagnosis not present

## 2022-11-12 DIAGNOSIS — J90 Pleural effusion, not elsewhere classified: Secondary | ICD-10-CM | POA: Diagnosis not present

## 2022-11-12 DIAGNOSIS — C3492 Malignant neoplasm of unspecified part of left bronchus or lung: Secondary | ICD-10-CM

## 2022-11-12 DIAGNOSIS — C3490 Malignant neoplasm of unspecified part of unspecified bronchus or lung: Secondary | ICD-10-CM | POA: Diagnosis not present

## 2022-11-12 DIAGNOSIS — D72829 Elevated white blood cell count, unspecified: Secondary | ICD-10-CM | POA: Diagnosis not present

## 2022-11-12 DIAGNOSIS — R110 Nausea: Secondary | ICD-10-CM | POA: Diagnosis not present

## 2022-11-12 DIAGNOSIS — J910 Malignant pleural effusion: Secondary | ICD-10-CM | POA: Insufficient documentation

## 2022-11-12 LAB — CBC WITH DIFFERENTIAL (CANCER CENTER ONLY)
Abs Immature Granulocytes: 0.13 10*3/uL — ABNORMAL HIGH (ref 0.00–0.07)
Basophils Absolute: 0.1 10*3/uL (ref 0.0–0.1)
Basophils Relative: 1 %
Eosinophils Absolute: 1.7 10*3/uL — ABNORMAL HIGH (ref 0.0–0.5)
Eosinophils Relative: 10 %
HCT: 42.2 % (ref 36.0–46.0)
Hemoglobin: 14.3 g/dL (ref 12.0–15.0)
Immature Granulocytes: 1 %
Lymphocytes Relative: 8 %
Lymphs Abs: 1.4 10*3/uL (ref 0.7–4.0)
MCH: 29.2 pg (ref 26.0–34.0)
MCHC: 33.9 g/dL (ref 30.0–36.0)
MCV: 86.1 fL (ref 80.0–100.0)
Monocytes Absolute: 1.1 10*3/uL — ABNORMAL HIGH (ref 0.1–1.0)
Monocytes Relative: 6 %
Neutro Abs: 12.9 10*3/uL — ABNORMAL HIGH (ref 1.7–7.7)
Neutrophils Relative %: 74 %
Platelet Count: 545 10*3/uL — ABNORMAL HIGH (ref 150–400)
RBC: 4.9 MIL/uL (ref 3.87–5.11)
RDW: 12.3 % (ref 11.5–15.5)
WBC Count: 17.2 10*3/uL — ABNORMAL HIGH (ref 4.0–10.5)
nRBC: 0 % (ref 0.0–0.2)

## 2022-11-12 LAB — CMP (CANCER CENTER ONLY)
ALT: 15 U/L (ref 0–44)
AST: 14 U/L — ABNORMAL LOW (ref 15–41)
Albumin: 2.8 g/dL — ABNORMAL LOW (ref 3.5–5.0)
Alkaline Phosphatase: 61 U/L (ref 38–126)
Anion gap: 7 (ref 5–15)
BUN: 5 mg/dL — ABNORMAL LOW (ref 6–20)
CO2: 27 mmol/L (ref 22–32)
Calcium: 8.3 mg/dL — ABNORMAL LOW (ref 8.9–10.3)
Chloride: 101 mmol/L (ref 98–111)
Creatinine: 0.64 mg/dL (ref 0.44–1.00)
GFR, Estimated: >60 mL/min (ref >60)
Glucose, Bld: 109 mg/dL — ABNORMAL HIGH (ref 70–99)
Potassium: 3.7 mmol/L (ref 3.5–5.1)
Sodium: 135 mmol/L (ref 135–145)
Total Bilirubin: 0.4 mg/dL (ref 0.3–1.2)
Total Protein: 5.5 g/dL — ABNORMAL LOW (ref 6.5–8.1)

## 2022-11-12 LAB — CYTOLOGY - NON PAP

## 2022-11-12 MED ORDER — KETOROLAC TROMETHAMINE 10 MG PO TABS: 10.0000 mg | ORAL_TABLET | Freq: Three times a day (TID) | ORAL | 0 refills | Status: DC

## 2022-11-12 MED ORDER — MAGNESIUM OXIDE -MG SUPPLEMENT 400 MG PO CAPS: 400.0000 mg | ORAL_CAPSULE | Freq: Two times a day (BID) | ORAL | 0 refills | Status: DC

## 2022-11-12 MED ORDER — AMOXICILLIN-POT CLAVULANATE 875-125 MG PO TABS: 1.0000 | ORAL_TABLET | Freq: Two times a day (BID) | ORAL | 0 refills | Status: DC

## 2022-11-12 MED ORDER — BENZONATATE 200 MG PO CAPS: 200.0000 mg | ORAL_CAPSULE | Freq: Three times a day (TID) | ORAL | 0 refills | Status: DC | PRN

## 2022-11-12 MED ORDER — ONDANSETRON HCL 8 MG PO TABS: 8.0000 mg | ORAL_TABLET | Freq: Three times a day (TID) | ORAL | 0 refills | Status: DC | PRN

## 2022-11-12 MED ORDER — CELECOXIB 200 MG PO CAPS: 200.0000 mg | ORAL_CAPSULE | Freq: Two times a day (BID) | ORAL | 0 refills | Status: DC

## 2022-11-12 MED ORDER — OXYCODONE HCL 5 MG PO TABS: 5.0000 mg | ORAL_TABLET | ORAL | 0 refills | Status: DC | PRN

## 2022-11-12 MED ORDER — SCOPOLAMINE 1 MG/3DAYS TD PT72: 1.0000 | MEDICATED_PATCH | TRANSDERMAL | 0 refills | Status: DC

## 2022-11-12 MED ORDER — METHYLPREDNISOLONE 4 MG PO TBPK: ORAL_TABLET | ORAL | 0 refills | Status: DC

## 2022-11-12 MED ORDER — FOLIC ACID 1 MG PO TABS: 1.0000 mg | ORAL_TABLET | Freq: Every day | ORAL | 4 refills | Status: DC

## 2022-11-12 MED ORDER — DEXAMETHASONE 6 MG PO TABS: 6.0000 mg | ORAL_TABLET | Freq: Every morning | ORAL | 0 refills | Status: DC

## 2022-11-12 MED ORDER — ONDANSETRON 4 MG PO FILM: 4.0000 mg | ORAL_FILM | Freq: Four times a day (QID) | ORAL | 0 refills | Status: DC | PRN

## 2022-11-12 MED ORDER — CYANOCOBALAMIN 1000 MCG/ML IJ SOLN
1000.0000 ug | Freq: Once | INTRAMUSCULAR | Status: AC
  Administered 2022-11-12: 1000 ug via INTRAMUSCULAR
  Filled 2022-11-12: qty 1

## 2022-11-12 NOTE — Telephone Encounter (Signed)
"  [facility] filled the wrong pain medication".  LVM to call me back. Per Dr. Arbutus Ped , I told pt to take Tylenol for pain and the medrol dose pak is for appetite.

## 2022-11-12 NOTE — Progress Notes (Signed)
START ON PATHWAY REGIMEN - Non-Small Cell Lung     Cycles 1 through up to 6: A cycle is every 21 days:     Bevacizumab-xxxx      Pemetrexed      Carboplatin   **Always confirm dose/schedule in your pharmacy ordering system**  Patient Characteristics: Stage IV Metastatic, Nonsquamous, Awaiting Molecular Test Results and Need to Start Chemotherapy, PS = 0, 1 Therapeutic Status: Stage IV Metastatic Histology: Nonsquamous Cell Broad Molecular Profiling Status: Awaiting Molecular Test Results and Need to Start Chemotherapy ECOG Performance Status: 1 Intent of Therapy: Non-Curative / Palliative Intent, Discussed with Patient

## 2022-11-12 NOTE — Telephone Encounter (Addendum)
Virtual Visit via Telephone Note  I connected with Melissa Pineda on 11/12/22 at  by telephone and verified that I am speaking with the correct person using two identifiers.  Location: Patient: Home Provider: Office   I discussed the limitations, risks, security and privacy concerns of performing an evaluation and management service by telephone and the availability of in person appointments. I also discussed with the patient that there may be a patient responsible charge related to this service. The patient expressed understanding and agreed to proceed.   I discussed the assessment and treatment plan with the patient. The patient was provided an opportunity to ask questions and all were answered. The patient agreed with the plan and demonstrated an understanding of the instructions.   The patient was advised to call back or seek an in-person evaluation if the symptoms worsen or if the condition fails to improve as anticipated.  I provided 60 minutes of non-face-to-face time during this encounter.  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 Recently discharged from the hospital on 1/20. Gene testing and complete staging in progress-there is plan for chemotherapy initiation until gene testing results. Patient was seen today in oncology clinic today for follow-up and for increasingly symptomatic disease. She was given a steroid prednisone dose pack, treated for a sinus infection with Augmentin, given anti-nausea medication(zofran) and xanax for anxiety. She was given tramadol for pain.Since being seen this morning her symptoms have intensified- she is intolerant to tramadol which made her dizzy and  nauseated, the Augmentin has also caused GI issues. She has not started the steroid dose pack and was advised to start following AM due to concerns over stimulant effect when taken in evening. Her diffuse chest pain has intensified as well as pain in her fronto-temporal region and maxilla.  Symptom Assessment:  Severe fronto-temporal head pain, pain and pressure in her bilateral maxilla and face-radiates down her neck and behind her ear on right. Reports discomfort is almost unbearable at times, has not gotten any relief with Ibuprophen/Tylenol or Tramadol. Decongestants have not helped. She has no sinus discharge or drainage, she reports her sinuses are completely dry and raw feeling from her nose down to her throat. MRI obtained on 1/18 does not show sinusitis or abnormal fluid supporting sinus infection. Her nasal passages are clear-pain is described as diffuse and difficult to localize, throbbing. She has an 61m calvarial metastatic lesion in the right frontal region- I suspect her pain is from this lesion and surrounding edema or early evolving disease. Involuntary gag reflex/hypersensitivity: Multiple times during discussion she had uncontrollable episodes of gagging and dry heaving without overt vomiting and not preceded by nausea. Suspect this is from Phrenic nerve irritation or vagal nerve irritation given primary tumor location and involvement of left diaphragm and extensive adenopathy throughout the mediastinum. Chest Wall Pain and persistent dry coughing. Lung mass/pleuritic pain especially at end of pleurx draining procedure. Dyspnea secondary to rapid pleural effusion re-accumulation-requires draining daily with large volume removal. Anxiety/Insomnia: Situational and related to new diagnosis-anxiety worsening by pain and dyspnea. Xanax has helped some at night- but she wakes up in pain and weak. Severe Fatigue and Weakness, describes this as debilitating. Poor Appetite /Nausea/  Vomiting/Diarrhea, multifactorial-cancer progression, medications, GI adenopathy.  Recommendations:  Explained and clarified that palliative care services can begin at the time of diagnosis. She does not need to give up any treatment options- or choose between services as documented in oncology office visit today. I reassured her that patients do substantially better when they receive the standard of oncologic care alongside palliative care support and symptom management. Will schedule her for in person visit in our palliative oncology clinic asap and coordinate with med onc team.  Discontinued Tramadol since this a weak opioid, her cancer related pain is reported as severe. May be worsening her NV. May cause dizziness and HA. Started Oxycodone '5mg'$  q3hrs prn moderate to severe pain. Discussed pain management concepts- not letting pain get to severe level before taking PRN. Changed dose Pack to Decadron -easier to take one pill instead of a handful and better tolerated -will help with inflammatory pain from metastatic disease and reduce adenopathy until she can begin treatment. Will also help with nausea and stimulate appetite and energy levels. Stop decongestants and Augmentin.May need abx prophylaxis with pleurx or prolonged drain. Initiate high dose COX-2 NSAID Celebrex '200mg'$  BID for inflammation- less GI side effects and therapeutic benefits possible in NSCLC.Stop Ibuprofen. Ok to continue Tylenol '100mg'$  TID. Magnesium Supplementation 800 mg daily for neuropathic pain, muscle weakness and bowel function. Zofran film '4mg'$  -difficulty keeping down pills reported. T-Scop patch for nausea-this will improve with steroids and once adenopathy is treated. Continue Alprazolam- may take up to '1mg'$  QHS for insomnia Follow-up on Pulmonary appt for Pleurx management. OTC laxative of choice prn for constipation.  Plan of care discussed in detail with patient and her mother. Instructions were provided on how to  take medications this evening to achieve relief.   Will follow up in AM and schedule in clinic for next available appointment.  Lane Hacker, DO Palliative Medicine   Time: 60 minutes

## 2022-11-12 NOTE — Telephone Encounter (Incomplete)
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 Recently discharged from the hospital on 1/20. Gene testing and complete staging in progress-there is plan for chemotherapy initiation until gene testing results. Patient was seen today in oncology clinic today for follow-up and for increasingly symptomatic disease. She was given a steroid prednisone dose pack, treated for a sinus infection with Augmentin, given anti-nausea medication(zofran) and xanax for anxiety. She was given tramadol for pain.Since being seen this morning her symptoms have intensified- she is intolerant to tramadol which made her dizzy and nauseated, the Augmentin has also caused GI issues. She has not started the steroid dose pack and was advised to start following AM due to concerns over stimulant effect when taken in evening. Her diffuse chest pain has intensified as well as pain in her fronto-temporal region and maxilla.  Symptom Assessment:  Severe fronto-temporal head pain, pain and pressure in her bilateral maxilla and face-radiates down her neck and behind her ear on the left. Reports discomfort is almost unbearable at times, has not gotten any relief with Ibuprophen/Tylenol or Tramadol. Decongestants have not helped. She has no sinus discharge or drainage, she reports her sinuses are completely dry and raw feeling from her nose down to her throat. MRI obtained on 1/18 does not show sinusitis or abnormal fluid supporting sinus infection. Her nasal passages are clear-pain is described as diffuse and difficult to localize, throbbing.

## 2022-11-12 NOTE — Progress Notes (Signed)
[facility] [phone]   [phone]  OFFICE PROGRESS NOTE  Patient, No Pcp Per No address on file  DIAGNOSIS: Stage IVB (T4, N3, M1b) non-small cell lung cancer, adenocarcinoma presented with large left diaphragmatic surface mass in addition to left hilar, infrahilar, AP window, right and left paratracheal, subcarinal as well as left internal mammary lymphadenopathy in addition to right gastric lymph node and solitary Small right frontal calvarial osseous metastatic disease with large malignant left pleural effusion diagnosed in January 2024.    Molecular studies are still pending  PRIOR THERAPY: Status post left Pleurx catheter placement for drainage of recurrent left pleural effusion  CURRENT THERAPY: Induction systemic chemotherapy with carboplatin for AUC of 5, Alimta 500 Mg/M2 and Avastin 15 Mg/KG for 1 cycle until the molecular studies becomes available.  First dose expected on November 19, 2022  INTERVAL HISTORY: Melissa Pineda 37 y.o. female returns to the clinic today for follow-up visit accompanied by her mother.  The patient has a lot of issues going on right now including generalized fatigue and weakness as well as lack of appetite.  She also has nausea and cough as well as sore throat and sinusitis.  She denied having any current chest pain except for occasional pain with the drainage of the left pleural effusion.  She has baseline shortness of breath increased with exertion with the cough and no hemoptysis.  She continues to have few episodes of nausea with no vomiting she had 1 episode of diarrhea last night and no constipation or abdominal pain.  She continues to have headache from the sinus infection.  She has no fever or chills.  She was discharged from the hospital few days ago.  She continues to have drainage of the left pleural effusion around 700 cc every day.  She is here today for evaluation and discussion of her treatment  options.  MEDICAL HISTORY: Past Medical History:  Diagnosis Date   Anxiety    [employer] 11/21/2020    ALLERGIES:  has No Known Allergies.  MEDICATIONS:  Current Outpatient Medications  Medication Sig Dispense Refill   albuterol (VENTOLIN HFA) 108 (90 Base) MCG/ACT inhaler Inhale into the lungs. (Patient not taking: Reported on 11/05/2022)     ALPRAZolam (XANAX) 0.5 MG tablet Take 1 tablet (0.5 mg total) by mouth 3 (three) times daily as needed for anxiety. 60 tablet 0   docusate sodium (COLACE) 100 MG capsule Take 1 capsule (100 mg total) by mouth 2 (two) times daily. (Patient not taking: Reported on 11/05/2022) 10 capsule 0   escitalopram (LEXAPRO) 10 MG tablet Take 1 tablet (10 mg total) by mouth daily. 30 tablet 1   guaiFENesin (MUCINEX) 600 MG 12 hr tablet Take 2 tablets (1,200 mg total) by mouth 2 (two) times daily. 60 tablet 0   ibuprofen (ADVIL) 200 MG tablet Take 400-600 mg by mouth 2 (two) times daily as needed for headache or mild pain.     levonorgestrel (MIRENA, 52 MG,) 20 MCG/DAY IUD 1 each by Intrauterine route once.     loratadine (CLARITIN) 10 MG tablet Take 0.5 tablets (5 mg total) by mouth daily. 30 tablet 0   pseudoephedrine (SUDAFED) 120 MG 12 hr tablet Take 1 tablet (120 mg total) by mouth 2 (two) times daily. 40 tablet 0   sodium chloride (OCEAN) 0.65 % SOLN nasal spray Place 1 spray into both nostrils as needed for congestion. 104 mL 0   traMADol (ULTRAM) 50 MG tablet  Take 2 tablets (100 mg total) by mouth every 6 (six) hours as needed for moderate pain. 45 tablet 0   No current facility-administered medications for this visit.    SURGICAL HISTORY:  Past Surgical History:  Procedure Laterality Date   IR PERC PLEURAL DRAIN W/INDWELL CATH W/IMG GUIDE  11/09/2022   WISDOM TOOTH EXTRACTION      REVIEW OF SYSTEMS:  Constitutional: positive for anorexia and fatigue Eyes: negative Ears, nose, mouth, throat, and face: positive for earaches and nasal  congestion Respiratory: positive for cough, dyspnea on exertion, and pleurisy/chest pain Cardiovascular: negative Gastrointestinal: positive for diarrhea and nausea Genitourinary:negative Integument/breast: negative Hematologic/lymphatic: negative Musculoskeletal:negative Neurological: negative Behavioral/Psych: positive for anxiety Endocrine: negative Allergic/Immunologic: negative   PHYSICAL EXAMINATION: General appearance: alert, cooperative, fatigued, and no distress Head: Normocephalic, without obvious abnormality, atraumatic Neck: no adenopathy, no JVD, supple, symmetrical, trachea midline, and thyroid not enlarged, symmetric, no tenderness/mass/nodules Lymph nodes: Cervical, supraclavicular, and axillary nodes normal. Resp: diminished breath sounds LLL and dullness to percussion LLL Back: symmetric, no curvature. ROM normal. No CVA tenderness. Cardio: Tachycardic GI: soft, non-tender; bowel sounds normal; no masses,  no organomegaly Extremities: extremities normal, atraumatic, no cyanosis or edema Neurologic: Alert and oriented X 3, normal strength and tone. Normal symmetric reflexes. Normal coordination and gait  ECOG PERFORMANCE STATUS: 1 - Symptomatic but completely ambulatory  Blood pressure 115/81, pulse (!) 125, temperature 97.6 F (36.4 C), temperature source Oral, resp. rate 18, weight 136 lb 14.4 oz (62.1 kg), last menstrual period 10/14/2022, SpO2 100 %, unknown if currently breastfeeding.  LABORATORY DATA: Lab Results  Component Value Date   WBC 14.6 (H) 11/09/2022   HGB 11.8 (L) 11/09/2022   HCT 37.0 11/09/2022   MCV 91.1 11/09/2022   PLT 376 11/09/2022      Chemistry      Component Value Date/Time   NA 136 11/09/2022 0513   NA 140 11/24/2020 1106   K 3.7 11/09/2022 0513   CL 103 11/09/2022 0513   CO2 24 11/09/2022 0513   BUN <5 (L) 11/09/2022 0513   BUN 7 11/24/2020 1106   CREATININE 0.43 (L) 11/09/2022 0513      Component Value Date/Time    CALCIUM 8.1 (L) 11/09/2022 0513   ALKPHOS 49 11/09/2022 0513   AST 12 (L) 11/09/2022 0513   ALT 12 11/09/2022 0513   BILITOT 0.6 11/09/2022 0513   BILITOT 1.5 (H) 11/24/2020 1106       RADIOGRAPHIC STUDIES: IR PERC PLEURAL DRAIN W/INDWELL CATH W/IMG GUIDE  Result Date: 11/09/2022 INDICATION: 37 year old with recently diagnosed adenocarcinoma and malignant left pleural effusion. Patient recently had a pigtail chest tube removed and now has re-accumulation of left pleural fluid. Plan for placement of a tunneled left pleural catheter. EXAM: PLACEMENT OF TUNNELED LEFT PLEURAL CATHETER USING ULTRASOUND AND FLUOROSCOPIC GUIDANCE MEDICATIONS: Ancef 2 g ANESTHESIA/SEDATION: Versed 2 mg Patient was monitored by a radiology nurse throughout the procedure. COMPLICATIONS: None immediate. PROCEDURE: Informed written consent was obtained from the patient after a thorough discussion of the procedural risks, benefits and alternatives. All questions were addressed. Maximal Sterile Barrier Technique was utilized including caps, mask, sterile gowns, sterile gloves, sterile drape, hand hygiene and skin antiseptic. A timeout was performed prior to the initiation of the procedure. Patient was placed on her right side. Ultrasound was used to evaluate the left pleural fluid. The left mid axillary region was prepped and draped in sterile fashion. Two areas in the left lateral lower chest were anesthetized  using 1% lidocaine with epinephrine. Two small incisions were made. The PleurX catheter was tunneled between these incisions and the cuff was placed underneath the skin. Using ultrasound guidance, pleural catheter was directed into the pleural space through the more lateral incision. Pleural catheter was confirmed within the pleural fluid using ultrasound and amber colored fluid was aspirated. Wire was advanced into the pleural space and the tract was dilated to accommodate the peel-away sheath. Pleural catheter was advanced  through the peel-away sheath and into the pleural space. Approximately 600 mL of amber colored fluid was removed from the left pleural space. The pleural entrance dermatotomy site was closed using absorbable suture and Dermabond. Catheter was secured to the skin with suture. Dressings were placed. Fluoroscopic and ultrasound images were taken and saved for documentation. FINDINGS: Large pocket of pleural fluid in the left lower chest. However, there is very complex loculated left pleural fluid as well. The largest pocket in left lower chest was targeted for the PleurX catheter placement. Approximately 600 mL of fluid was removed. IMPRESSION: Successful placement of a tunneled left pleural catheter using ultrasound and fluoroscopic guidance. Electronically Signed   By: Richarda Overlie M.D.   On: 11/09/2022 17:37   DG Chest Port 1 View  Result Date: 11/09/2022 CLINICAL DATA:  Non-small-cell lung cancer.  Drain placement. EXAM: PORTABLE CHEST 1 VIEW COMPARISON:  11/09/2022, earlier same day FINDINGS: Diffuse interstitial opacity in the left lung is similar to prior with persistent left base collapse/consolidation and moderate left pleural effusion. Hazy opacity over the left apex may represent fluid in the fissure or loculated posterior pleural effusions seen on previous CT imaging. Left pleural drain is new in the interval. Right lung remains clear. Cardiopericardial silhouette is at upper limits of normal for size. The visualized bony structures of the thorax are unremarkable. IMPRESSION: Interval placement of left pleural drain with persistent left base collapse/consolidation and moderate left pleural effusion. Electronically Signed   By: Kennith Center M.D.   On: 11/09/2022 12:55   [place] CHEST (PLEURAL EFFUSION)  Result Date: 11/09/2022 CLINICAL DATA:  Evaluate pleural effusion. EXAM: CHEST ULTRASOUND COMPARISON:  Chest radiograph 11/09/2022 FINDINGS: There is a moderate amount of complicated fluid demonstrated  within the left chest compatible with pleural effusion. No significant right pleural effusion identified. IMPRESSION: Moderate left pleural effusion. Electronically Signed   By: Annia Belt M.D.   On: 11/09/2022 09:51   DG CHEST PORT 1 VIEW  Result Date: 11/09/2022 CLINICAL DATA:  Pleural effusion. EXAM: PORTABLE CHEST 1 VIEW COMPARISON:  11/08/2022 FINDINGS: Patient is slightly rotated to the right. Interval removal of left basilar pigtail pleural drainage catheter. Stable to slight interval worsening of large left pleural effusion with loculation over the left upper lobe/apex. No definite left apical pneumothorax. Right lung is clear. Cardiomediastinal silhouette and remainder of the exam is unchanged. IMPRESSION: Stable to slight interval worsening of large left pleural effusion with loculation over the left upper lobe/apex. Interval removal of left basilar pigtail pleural drainage catheter. No definite left pneumothorax. Electronically Signed   By: Elberta Fortis M.D.   On: 11/09/2022 08:11   DG CHEST PORT 1 VIEW  Result Date: 11/08/2022 CLINICAL DATA:  Pleural effusion. EXAM: PORTABLE CHEST 1 VIEW COMPARISON:  AP chest 11/07/2022 at 4:54 a.m. FINDINGS: Unchanged position of left basilar pigtail chest tube. Mild-to-moderate left pleural effusion with associated left midlung heterogeneous opacities, unchanged. The right lung is clear. Note is made of small left apical pneumothorax, unchanged from 11/07/2022 radiograph and  better seen on most recent study of 11/07/2022 chest CT. The visualized cardiac silhouette and mediastinal contours are within normal limits. No acute skeletal abnormality. IMPRESSION: 1. Unchanged position of left basilar pigtail chest tube. 2. Left basilar lower lung opacities, representing a combination of pleural effusion, atelectasis, and left perihilar and lower lobe lung tumor better seen on yesterday's chest CT. 3. Unchanged small left apical pneumothorax. Electronically Signed    By: Neita Garnet M.D.   On: 11/08/2022 08:23   MR BRAIN W WO CONTRAST  Result Date: 11/08/2022 CLINICAL DATA:  Non-small cell lung cancer, staging EXAM: MRI HEAD WITHOUT AND WITH CONTRAST TECHNIQUE: Multiplanar, multiecho pulse sequences of the brain and surrounding structures were obtained without and with intravenous contrast. CONTRAST:  76mL GADAVIST GADOBUTROL 1 MMOL/ML IV SOLN COMPARISON:  None Available. FINDINGS: Brain: No abnormal parenchymal or meningeal enhancement. No restricted diffusion to suggest acute or subacute infarct. No acute hemorrhage, mass, mass effect, or midline shift. No hydrocephalus or extra-axial collection. No hemosiderin deposition to suggest remote hemorrhage. Normal pituitary. Low lying cerebellar tonsils, which extend 3 mm below the foramen magnum and do not meet criteria for Chiari I malformation. Vascular: Patent arterial flow voids. Skull and upper cervical spine: 8 mm enhancing lesions in the right frontal calvarium (series 16, image 129 and 135). Sinuses/Orbits: Clear paranasal sinuses. No acute finding in the orbits. Other: The mastoid air cells are well aerated. IMPRESSION: 1. No evidence of metastatic disease in the brain. 2. 8 mm enhancing lesions in the right frontal calvarium, concerning for osseous metastatic disease in the setting of primary lung cancer. Electronically Signed   By: Wiliam Ke M.D.   On: 11/08/2022 01:32   CT CHEST ABDOMEN PELVIS W CONTRAST  Result Date: 11/07/2022 CLINICAL DATA:  Non-small cell lung cancer staging * Tracking Code: BO * EXAM: CT CHEST, ABDOMEN, AND PELVIS WITH CONTRAST TECHNIQUE: Multidetector CT imaging of the chest, abdomen and pelvis was performed following the standard protocol during bolus administration of intravenous contrast. RADIATION DOSE REDUCTION: This exam was performed according to the departmental dose-optimization program which includes automated exposure control, adjustment of the mA and/or kV according to  patient size and/or use of iterative reconstruction technique. CONTRAST:  OMNIPAQUE IOHEXOL 300 MG/ML  SOLN COMPARISON:  11/04/2022 CT chest, and CT abdomen from 10/09/2011 FINDINGS: CT CHEST FINDINGS Cardiovascular: Suspected pericardial effusion especially along the cardiac apex, potentially with associated complexity as on image 44 series 2. Mediastinum/Nodes: Bulky but indistinctly marginated right and left paratracheal, AP window, prevascular, left internal mammary, subcarinal, left hilar, and left infrahilar adenopathy. Index right paratracheal node 1.9 cm in short axis, image 22 series 2. Index left internal mammary node 1.4 cm in short axis, image 24 series 2. The hilar and infrahilar adenopathy is somewhat conglomerate and difficult to individually measure. AP window lymph node 1.4 cm in short axis, image 24 series 2. Lungs/Pleura: Substantially reduced size of the left pleural effusion with a left-sided chest tube in place. Scattered loculations of gas in the left pleural space along with loculations of fluid along the major fissure and posteriorly. The left lower lobe bronchus is essentially occluded by central adenopathy in tumor, with the left upper lobe bronchus narrowed due to surrounding adenopathy. Complex appearance at the left lung base likely due to a combination of tumor in atelectatic lung for example on image 69 series 4. Suspected extensive tumor versus adherent atelectatic lung along the left diaphragm and pleural surface. Irregular deposition of tumor posteriorly  along the pleural and diaphragmatic margin for example on image 95 series 4. It is difficult to confidently measure tumor separate from atelectatic lung but suspected tumor along the diaphragmatic surface measures about 9.9 by 6.4 cm on image 47 series 2. Secondary pulmonary lobular interstitial accentuation throughout the left lung. Small left apical hydropneumothorax with air-fluid level. Scattered ground-glass opacity in  the aerated portions of the left lower lobe. Right apical pleuroparenchymal scarring. Right middle lobe nodule 0.9 by 0.6 cm, image 103 series 6. In the right lower lobe there are a few nodules measuring up to about 5 mm in diameter which could be inflammatory or metastatic. Musculoskeletal: Unremarkable CT ABDOMEN PELVIS FINDINGS Hepatobiliary: Unremarkable Pancreas: Unremarkable Spleen: Unremarkable Adrenals/Urinary Tract: No adrenal mass. The kidneys appear unremarkable. There is contrast medium in the collecting systems which could obscure nonobstructive calculi, but there is no hydronephrosis or hydroureter. Stomach/Bowel: Prominent stool throughout the colon favors constipation. Vascular/Lymphatic: A right gastric lymph node measures 10 mm in short axis on image 56 series 2. Reproductive: An IUD is satisfactorily positioned along the endometrium. Adnexa unremarkable. Other: There is a small amount of right eccentric free pelvic fluid in the cul-de-sac on image 103 series 2, possibly physiologic but technically nonspecific. No well-defined nodularity along the omentum or peritoneal surfaces. Musculoskeletal: Mild edema tracks along the left lateral abdominal wall musculature, nonspecific and possibly related to the left chest tube. No specific indicators of osseous metastatic disease. IMPRESSION: 1. Bulky mediastinal, left hilar, and left infrahilar adenopathy compatible with malignancy. The left lower lobe bronchus is occluded and the left upper lobe bronchus is narrowed due to surrounding adenopathy. There is a large amount of tumor along the left pleural surface and diaphragm, along with additional pleural deposits of tumor, and a loculated left hydropneumothorax with chest tube in place. 2. There is also a 9 by 6 mm right middle lobe nodule and a few small nodules in the right lower lobe which could be inflammatory or metastatic. 3. Secondary pulmonary lobular interstitial accentuation in the left lung,  probably from lymphangitic spread of tumor or edema. 4. Suspected pericardial effusion especially along the cardiac apex, potentially with associated complexity. Malignant pericardial effusion not excluded. 5. Small amount of free pelvic fluid in the cul-de-sac, possibly physiologic but technically nonspecific. 6. Prominent stool throughout the colon favors constipation. 7. Mild edema along the left lateral abdominal wall musculature, nonspecific and possibly related to the left chest tube. 8. The only finding of potential extra thoracic malignant involvement is a mildly enlarged right gastric lymph node (1.0 cm in diameter). Electronically Signed   By: Gaylyn Rong M.D.   On: 11/07/2022 21:54   DG CHEST PORT 1 VIEW  Result Date: 11/07/2022 CLINICAL DATA:  Pleural effusion on left. EXAM: PORTABLE CHEST 1 VIEW COMPARISON:  AP chest 11/06/2022, CT chest 11/04/2022 FINDINGS: Redemonstration of left basilar pigtail chest tube. Mild interval improvement in now mild-to-moderate left pleural effusion, previously extending 60% up the left hemithorax and now extending 30-40% up the left hemithorax. Associated left midlung linear densities. Minimal right costophrenic angle linear subsegmental atelectasis. No pneumothorax. Visualized cardiac silhouette and mediastinal contours are within normal limits. No acute skeletal abnormality. IMPRESSION: Redemonstration of left basilar pigtail chest tube. Mild interval improvement in now mild-to-moderate left pleural effusion Electronically Signed   By: Neita Garnet M.D.   On: 11/07/2022 08:29   DG CHEST PORT 1 VIEW  Result Date: 11/06/2022 CLINICAL DATA:  Chest tube placement.  [id number] EXAM: PORTABLE CHEST  1 VIEW COMPARISON:  11/06/2022 at 4:18 a.m. FINDINGS: There is a large left pleural effusion with basilar atelectasis or consolidation. Similar appearance to prior study. A left chest tube has been placed in the lower chest region. The right lung is clear. Heart size is  obscured by the left pleural/parenchymal process. No pneumothorax. IMPRESSION: Large left pleural effusion with infiltration or atelectasis in the left base. Interval placement of a left chest tube. No pneumothorax. Electronically Signed   By: Burman Nieves M.D.   On: 11/06/2022 17:49   DG CHEST PORT 1 VIEW  Result Date: 11/06/2022 CLINICAL DATA:  Follow-up pleural effusion. EXAM: PORTABLE CHEST 1 VIEW COMPARISON:  11/05/22 FINDINGS: Stable cardiomediastinal contours. There is a large left pleural effusion which appears unchanged in volume from the previous exam. Right lung appears clear. No new findings. IMPRESSION: No change in large left pleural effusion. Electronically Signed   By: Signa Kell M.D.   On: 11/06/2022 08:15   DG CHEST PORT 1 VIEW  Result Date: 11/05/2022 CLINICAL DATA:  Post thoracentesis. EXAM: PORTABLE CHEST 1 VIEW COMPARISON:  Chest x-ray from earlier same day. FINDINGS: Decreased size of the LEFT pleural effusion status post thoracentesis. Large opacity persists at the LEFT lung base, likely residual pleural effusion and/or atelectasis/airspace collapse. RIGHT lung is clear. No pneumothorax is seen. Heart size and mediastinal contours are grossly stable. IMPRESSION: 1. Decreased size of the LEFT pleural effusion status post thoracentesis. No pneumothorax. 2. Large opacity persists at the LEFT lung base, likely residual pleural effusion and/or atelectasis/airspace collapse. Electronically Signed   By: Bary Richard M.D.   On: 11/05/2022 13:24   DG CHEST PORT 1 VIEW  Result Date: 11/05/2022 CLINICAL DATA:  Short of breath EXAM: PORTABLE CHEST 1 VIEW COMPARISON:  11/04/2022 FINDINGS: LEFT cardiac silhouette is obscured large LEFT effusion. LEFT pleural effusion occupies 75% of LEFT hemithorax and is increased comparison exam. RIGHT lung clear. No pneumothorax. IMPRESSION: Increasing large LEFT effusion occupying 75% of the LEFT hemithorax. Electronically Signed   By: Genevive Bi M.D.   On: 11/05/2022 10:14   CT Angio Chest PE W and/or Wo Contrast  Result Date: 11/04/2022 CLINICAL DATA:  Pneumonia and worsening shortness of breath EXAM: CT ANGIOGRAPHY CHEST WITH CONTRAST TECHNIQUE: Multidetector CT imaging of the chest was performed using the standard protocol during bolus administration of intravenous contrast. Multiplanar CT image reconstructions and MIPs were obtained to evaluate the vascular anatomy. RADIATION DOSE REDUCTION: This exam was performed according to the departmental dose-optimization program which includes automated exposure control, adjustment of the mA and/or kV according to patient size and/or use of iterative reconstruction technique. CONTRAST:  41mL OMNIPAQUE IOHEXOL 350 MG/ML SOLN COMPARISON:  Chest radiograph dated 10/30/2022 FINDINGS: Cardiovascular: The study is high quality for the evaluation of pulmonary embolism. There are no filling defects in the central, lobar, segmental or subsegmental pulmonary artery branches to suggest acute pulmonary embolism. Great vessels are normal in course and caliber. Normal heart size. No significant pericardial fluid/thickening. Mediastinum/Nodes: Imaged thyroid gland without nodules meeting criteria for imaging follow-up by size. Rightward shift of the mediastinum. Normal esophagus. Multi station lymphadenopathy including 1.3 cm left internal mammary lymph node(5:98), 2.0 cm subcarinal lymph node (4:59), and 1.4 cm prevascular lymph node (4:51). Lungs/Pleura: The central airways are patent. Near-complete atelectasis of the lingula and left lower lobe with heterogeneous consolidation resulting in marked narrowing of the left sided airways. Dependent ground-glass opacity in the left upper lobe. No pneumothorax. Large left pleural effusion.  Upper abdomen: Normal. Musculoskeletal: No acute or abnormal lytic or blastic osseous lesions. Review of the MIP images confirms the above findings. IMPRESSION: 1. No acute pulmonary  embolism. 2. Near-complete atelectasis of the lingula and left lower lobe with heterogeneous consolidation resulting in marked narrowing of the left sided airways, likely multifocal pneumonia. 3. Large left pleural effusion. 4. Multi station mediastinal lymphadenopathy likely reactive Electronically Signed   By: Agustin Cree M.D.   On: 11/04/2022 14:51   DG Chest Port 1 View  Result Date: 11/04/2022 CLINICAL DATA:  Provided history: Left chest pain. EXAM: PORTABLE CHEST 1 VIEW COMPARISON:  Prior chest radiograph 10/30/2022. FINDINGS: Significant interval increase in size of a left pleural effusion, now moderate-to-large. Underlying atelectasis and/or airspace consolidation within the mid and lower left lung, also significantly progressed. No appreciable airspace consolidation on the right. Portions of the cardiac silhouette are obscured on the left, limiting evaluation of heart size. No evidence of pneumothorax. No acute bony abnormality identified. IMPRESSION: Significant interval increase in size of a left pleural effusion, now moderate-to-large. Underlying atelectasis and/or airspace consolidation within the mid and lower left lung has also significantly progressed from the prior examination of 10/30/2022. Electronically Signed   By: Jackey Loge D.O.   On: 11/04/2022 12:22   DG Chest Portable 1 View  Result Date: 10/30/2022 CLINICAL DATA:  Cough, headache, fatigue, night sweats. EXAM: PORTABLE CHEST 1 VIEW COMPARISON:  Overlapping portions CT abdomen 10/09/2011 FINDINGS: Left lower lobe and possible lingular consolidation. Pleural thickening along the left lateral costophrenic angle may reflect adjacent pleural effusion. The right lung appears clear. Cardiac and mediastinal margins appear normal. IMPRESSION: 1. Left lower lobe and possible lingular consolidation with small left pleural effusion. Findings favor left basilar pneumonia over atelectasis. Followup PA and lateral chest X-ray is recommended in 3-4  weeks following trial of antibiotic therapy to ensure resolution and exclude the unlikely possibility of underlying malignancy. Electronically Signed   By: Gaylyn Rong M.D.   On: 10/30/2022 10:54    ASSESSMENT AND PLAN: This is a very pleasant 37 years old white female with  Stage IVB (T4, N3, M1b) non-small cell lung cancer, adenocarcinoma presented with large left diaphragmatic surface mass in addition to left hilar, infrahilar, AP window, right and left paratracheal, subcarinal as well as left internal mammary lymphadenopathy in addition to right gastric lymph node and solitary small right frontal calvarial osseous metastatic disease with large malignant left pleural effusion diagnosed in January 2024.   Molecular studies sent to [facility] as well as [facility] is still pending. I had a lengthy discussion with the patient and her mother today about her current condition and treatment options.  The patient is currently symptomatic from her disease. She understands that she has incurable condition and all the treatment will be of palliative nature. She is very interested in proceeding with treatment and not considering palliative care at this point. I discussed with her waiting for the molecular studies before restarting treatment versus consideration of 1 cycle of systemic chemotherapy with carboplatin for AUC of 5, Alimta 500 Mg/M2 and Avastin 15 Mg/KG until the molecular studies becomes available.  The patient is symptomatic and she is interested in consideration of the chemotherapy if there is significant delay in the molecular studies. I will arrange for her to receive the first cycle of this treatment next week. I discussed with the patient the adverse effect of this treatment including mild alopecia, nausea and vomiting, myelosuppression, liver or renal dysfunction that  we will continue to monitor closely. If the molecular studies becomes available before proceeding with the first  cycle of treatment and that showed an actionable mutation, we will discontinue the chemotherapy that is planned for next week. For the cough, I will start her on Tessalon Perles For the lack of appetite I will give her Medrol Dosepak today. For the sinusitis and sore throat, I will start the patient empirically on Augmentin 875 mg p.o. twice daily for the next 7 days. For the recurrent right pleural effusion, she will continue the drainage via the Pleurx and I will arrange for her a follow-up appointment with pulmonary medicine for management of her pleural effusion. The patient will receive vitamin B12 injection today. I will call her pharmacy with prescription for Zofran 8 mg p.o. every 8 hours as needed for nausea, folic acid 1 mg p.o. daily in addition to the benzoate and Medrol Dosepak as well as Augmentin. The patient will come back for follow-up visit next week for reevaluation before starting the first cycle of her treatment. I will also refer the patient for a second opinion with Dr. Malachy Chamber at [facility] cancer center for assurance of her current management. She is a scheduled to have a PET scan on November 15, 2022. The patient was advised to call immediately if she has any other concerning symptoms in the interval. The patient voices understanding of current disease status and treatment options and is in agreement with the current care plan.  All questions were answered. The patient knows to call the clinic with any problems, questions or concerns. We can certainly see the patient much sooner if necessary.  The total time spent in the appointment was 55 minutes.  Disclaimer: This note was dictated with voice recognition software. Similar sounding words can inadvertently be transcribed and may not be corrected upon review.

## 2022-11-13 ENCOUNTER — Telehealth: Payer: Self-pay | Admitting: Pulmonary Disease

## 2022-11-13 ENCOUNTER — Encounter (HOSPITAL_COMMUNITY): Payer: Self-pay | Admitting: Internal Medicine

## 2022-11-13 ENCOUNTER — Other Ambulatory Visit: Payer: Self-pay

## 2022-11-13 NOTE — Telephone Encounter (Signed)
Not listed with [place] office Requested Prescriptions  Pending Prescriptions Disp Refills   ondansetron (ZOFRAN) 4 MG tablet [Pharmacy Med Name: ONDANSETRON HCL 4 MG TABLET] 15 tablet 0    Sig: TAKE 1-2 TABLETS BY MOUTH EVERY 6 (SIX) HOURS AS NEEDED.     There is no refill protocol information for this order

## 2022-11-13 NOTE — Telephone Encounter (Signed)
Melissa Pineda [phone]  From [facility] Calling  Needs notes approved.   "Frequency 1 Week 4"she said  Plurex Drain is what they manage  Dr. Valeta Harms is aware. The sales rep spoke with him yesterday.  Pls call to advise. TY.

## 2022-11-14 ENCOUNTER — Other Ambulatory Visit: Payer: Self-pay

## 2022-11-14 NOTE — Telephone Encounter (Signed)
Spoke to pt yesterday and pt has a appt with Tammy 11/15/2022.

## 2022-11-14 NOTE — Telephone Encounter (Signed)
Patient is scheduled 1/26 with Rexene Edison, NP

## 2022-11-15 ENCOUNTER — Encounter: Payer: Self-pay | Admitting: Adult Health

## 2022-11-15 ENCOUNTER — Ambulatory Visit (HOSPITAL_COMMUNITY)
Admission: RE | Admit: 2022-11-15 | Discharge: 2022-11-15 | Disposition: A | Discharge status: Home / Self Care | Payer: BC Managed Care – PPO | Source: Ambulatory Visit | Attending: Internal Medicine | Admitting: Internal Medicine

## 2022-11-15 ENCOUNTER — Telehealth: Payer: Self-pay | Admitting: Internal Medicine

## 2022-11-15 ENCOUNTER — Ambulatory Visit (INDEPENDENT_AMBULATORY_CARE_PROVIDER_SITE_OTHER): Payer: BC Managed Care – PPO | Admitting: Adult Health

## 2022-11-15 ENCOUNTER — Other Ambulatory Visit: Payer: Self-pay | Admitting: Internal Medicine

## 2022-11-15 ENCOUNTER — Telehealth: Payer: Self-pay

## 2022-11-15 ENCOUNTER — Telehealth: Payer: Self-pay | Admitting: Adult Health

## 2022-11-15 ENCOUNTER — Ambulatory Visit (INDEPENDENT_AMBULATORY_CARE_PROVIDER_SITE_OTHER): Payer: BC Managed Care – PPO

## 2022-11-15 VITALS — BP 90/60 | HR 89 | Ht 66.0 in | Wt 135.0 lb

## 2022-11-15 DIAGNOSIS — C3492 Malignant neoplasm of unspecified part of left bronchus or lung: Secondary | ICD-10-CM

## 2022-11-15 DIAGNOSIS — C3490 Malignant neoplasm of unspecified part of unspecified bronchus or lung: Secondary | ICD-10-CM | POA: Diagnosis not present

## 2022-11-15 DIAGNOSIS — J90 Pleural effusion, not elsewhere classified: Secondary | ICD-10-CM

## 2022-11-15 DIAGNOSIS — J910 Malignant pleural effusion: Secondary | ICD-10-CM

## 2022-11-15 LAB — GLUCOSE, CAPILLARY: Glucose-Capillary: 152 mg/dL — ABNORMAL HIGH (ref 70–99)

## 2022-11-15 IMAGING — US US BREAST*L* LIMITED INC AXILLA
1 series · 4 of 4 positions shown · non-contrast
Comparison: Previous exam(s).

CLINICAL DATA: Twelve month follow-up of a left breast mass

EXAM:
ULTRASOUND OF THE LEFT BREAST

[Series 1: us breast*left* limited inc axilla · 0.06mm/px · 4 of 4 slices shown]
[im 1/4]
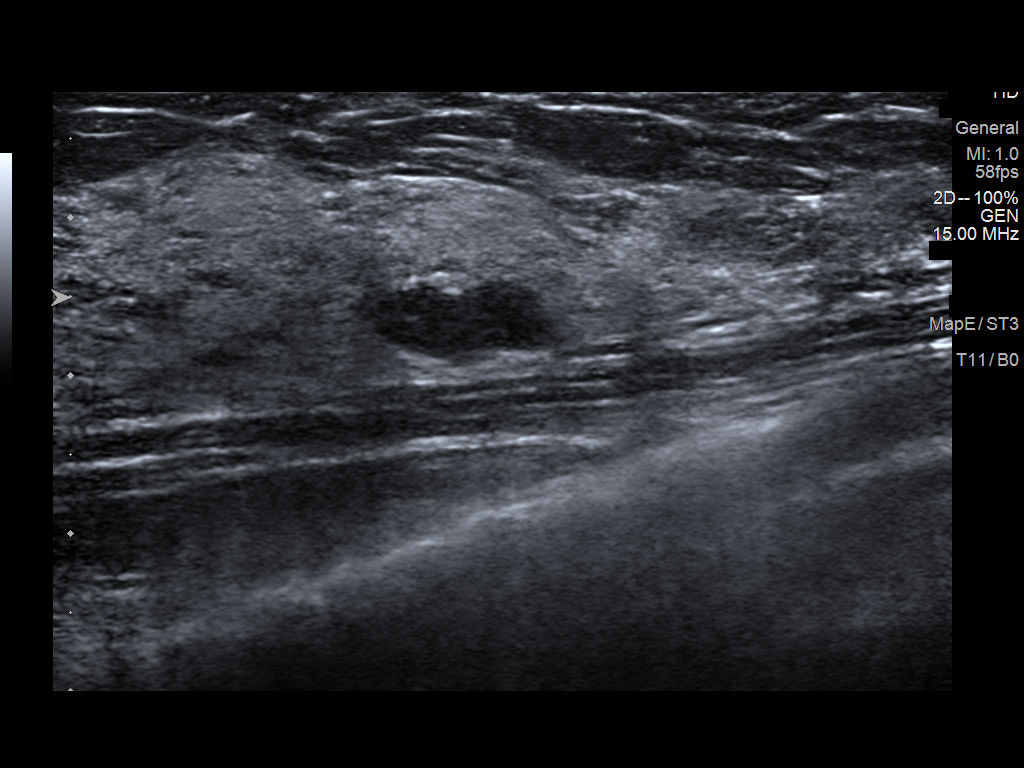
[im 2/4]
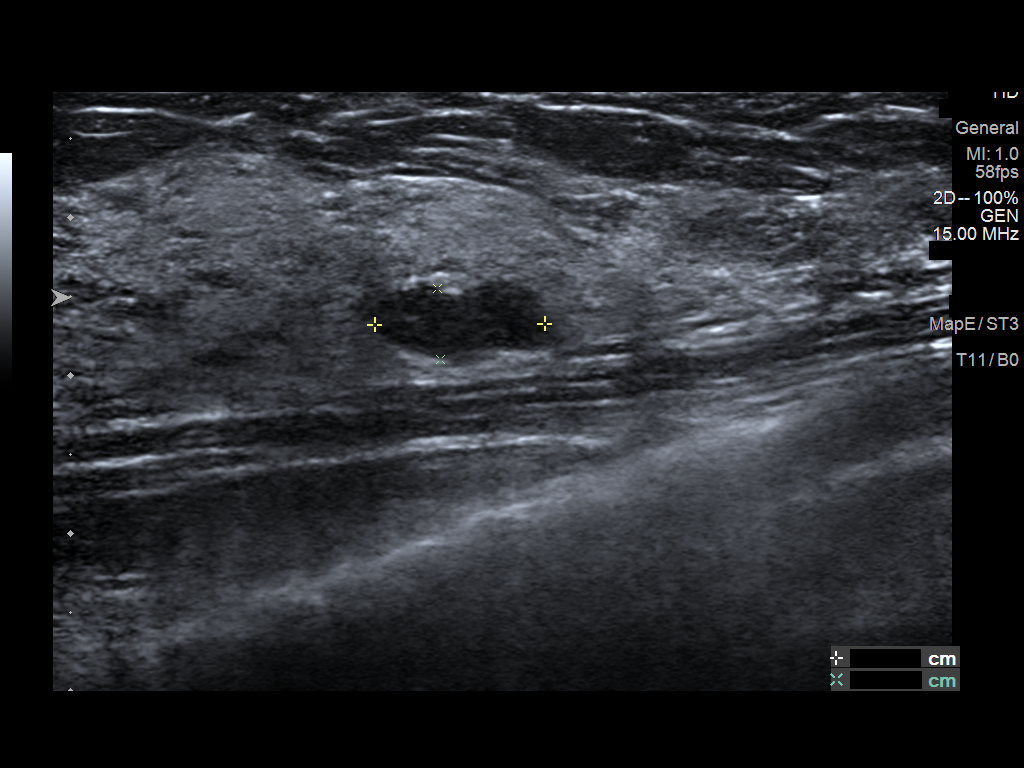
[im 3/4]
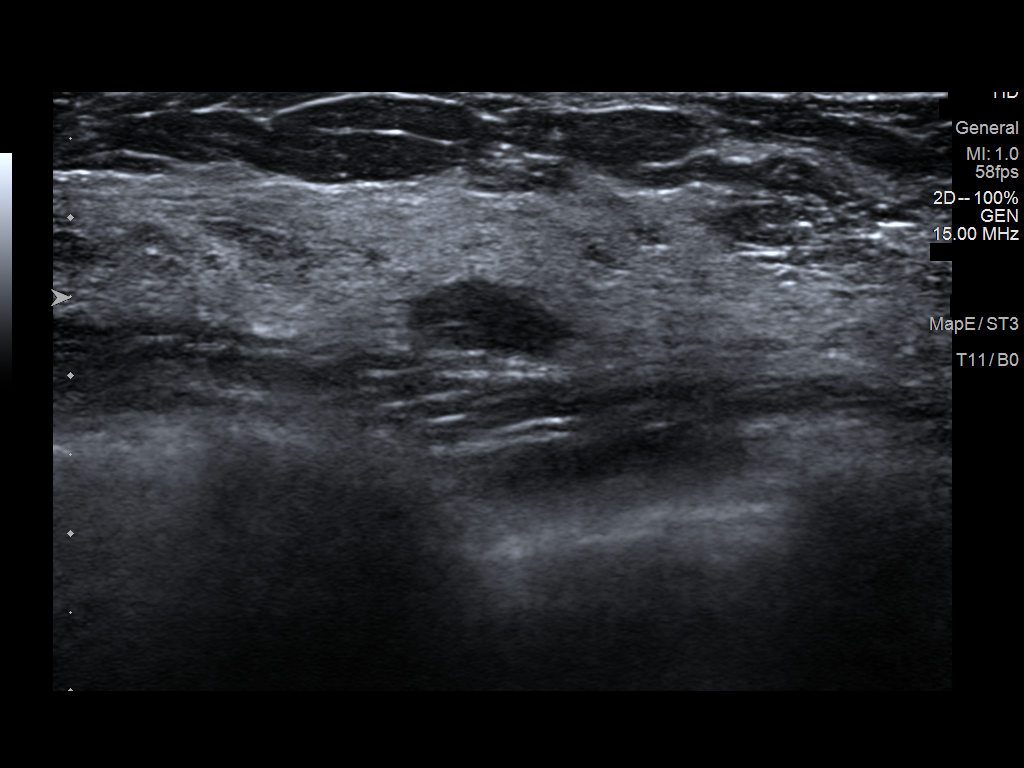
[im 4/4]
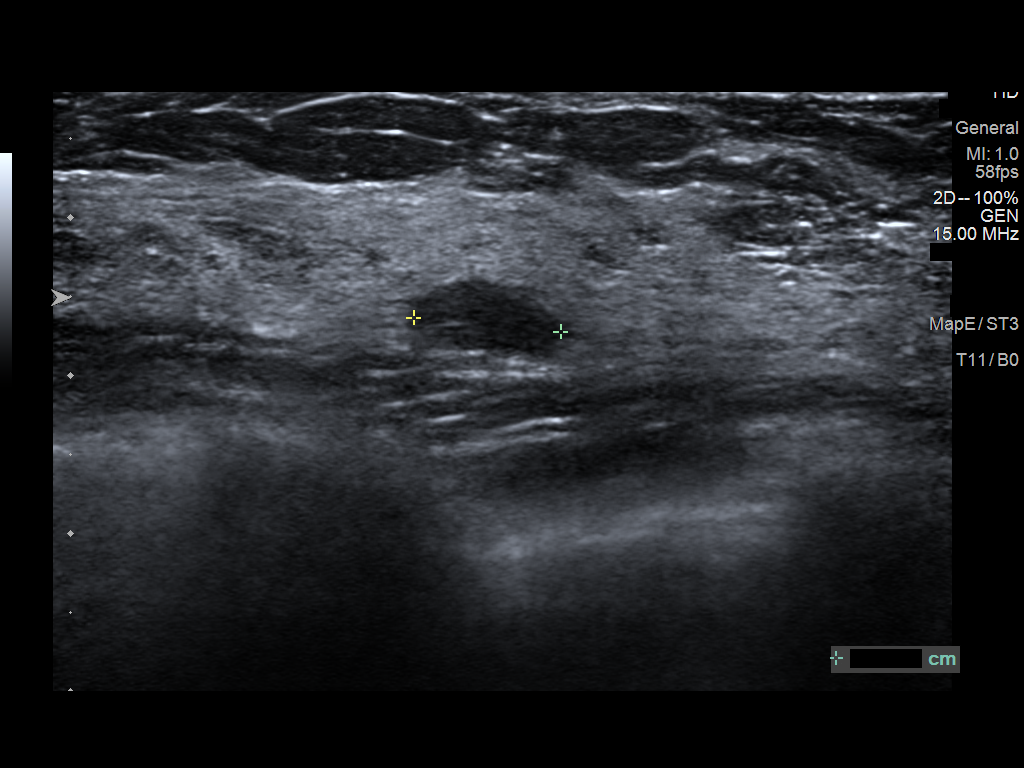

[4 of 4 positions shown; findings below may reference images not displayed]

FINDINGS: Targeted ultrasound is performed, showing a persistent mass in the
left breast at 10 o'clock, 1 cm from the nipple measuring 11 x 5 by
9 mm today versus 14 x 7 x 13 mm in Thursday August, 2019.
IMPRESSION: No significant change in the probably benign left breast mass.

RECOMMENDATION:
Recommend 12 month follow-up ultrasound of the probably benign left
breast mass.

I have discussed the findings and recommendations with the patient.
If applicable, a reminder letter will be sent to the patient
regarding the next appointment.

BI-RADS CATEGORY  3: Probably benign.

## 2022-11-15 MED ORDER — ONDANSETRON 8 MG PO TBDP: 8.0000 mg | ORAL_TABLET | Freq: Three times a day (TID) | ORAL | 1 refills | Status: AC | PRN

## 2022-11-15 MED ORDER — FLUDEOXYGLUCOSE F - 18 (FDG) INJECTION
7.1000 | Freq: Once | INTRAVENOUS | Status: AC
  Administered 2022-11-15: 7.1 via INTRAVENOUS

## 2022-11-15 NOTE — Progress Notes (Unsigned)
CHART NOTE I received the [facility] blood molecular study this afternoon. Fortunately it showed that Melissa Pineda has an actionable mutation as below Detected Alteration(s) / Biomarker(s) Associated FDA-approved therapies Clinical Trial   EZR-ROS1 fusion  approved by FDA Crizotinib, Entrectinib, Repotrectinib  approved in other indication Ceritinib, Lorlatinib Yes 3.6%  She also has PD-L1 expression by [facility] that was 98%. I called Melissa Pineda with the results and I will cancel the chemotherapy that was tentative for next week since the molecular studies are available. There are several medication approved for her condition as listed above. She has an appointment with Melissa Pineda at [facility] for a second opinion and also for assurance that she would receive the appropriate treatment locally. I will arrange for her a follow-up appointment with me after her visit with Melissa Pineda.  We will start the process with our oral pharmacist to get the approval for Entrectinib or repotrectinib early next week.

## 2022-11-15 NOTE — Patient Instructions (Signed)
Pleurx care as discussed  Pleurx drainage daily .  Activity as tolerated.  Keep follow up with Oncology as planned  Follow up in 1 week with Dr. Valeta Harms or Amaro Mangold NP and As needed

## 2022-11-15 NOTE — Telephone Encounter (Signed)
Please give patient any appointment she needs next week. Can double book me , whatever works with her schedule , quick visit to remove pleurx suture .

## 2022-11-15 NOTE — Assessment & Plan Note (Signed)
Stage IVb non-small cell lung cancer adenocarcinoma-large left diaphragmatic surface mass in addition significant lymphadenopathy and large left malignant pleural effusion and right frontal osseous metastatic disease Continue follow-up with oncology.  Plans for upcoming chemotherapy.  Molecular studies are pending.  PET scan is pending for later today Palliative care following . Continue with symptom burden management  Plan  Patient Instructions  Pleurx care as discussed  Pleurx drainage daily .  Activity as tolerated.  Keep follow up with Oncology as planned  Follow up in 1 week with Dr. Valeta Harms or Vela Render NP and As needed

## 2022-11-15 NOTE — Assessment & Plan Note (Signed)
Recurrent left malignant pleural effusion in the setting of stage IV adenocarcinoma of the left lung-status post Pleurx catheter placed November 09, 2022.  Patient is draining well.  Seems to be to breathing.  Chest x-ray shows stable moderate left effusion.  Continue with daily Pleurx care.  Daily drainage. Will have her return next week.  Will remove Pleurx suture.   Plan  Patient Instructions  Pleurx care as discussed  Pleurx drainage daily .  Activity as tolerated.  Keep follow up with Oncology as planned  Follow up in 1 week with Dr. Valeta Harms or Jaramie Bastos NP and As needed

## 2022-11-15 NOTE — Progress Notes (Signed)
@Patient  ID: Melissa Pineda, female    DOB: [birth year], 37 y.o.   MRN: [id number]  Chief Complaint  Patient presents with   Follow-up    Wound care    Referring provider: No ref. provider found  HPI: 37 year old female never smoker seen for pulmonary consult during hospitalization January 2024 for newly diagnosed stage IVb non-small cell lung cancer, adenocarcinoma.  She presented with a large left diaphragmatic surface mass in addition to left hilar, infrahilar peritracheal, subcarinal and left internal mammary lymphadenopathy, right gastric lymph node and solitary small right frontal calvarial osseous metastic disease with large left malignant pleural effusion. She required a left Pleurx catheter for drainage of a recurrent left pleural effusion She has been followed by medical oncology with plans to begin carboplatin, Alimta, Avastin (until molecular studies become available ), treatment to begin November 19, 2022.  TEST/EVENTS :  November 07, 2022 CT chest abdomen and pelvis showed Bulky mediastinal, left hilar, and left infrahilar adenopathy compatible with malignancy. The left lower lobe bronchus is occluded and the left upper lobe bronchus is narrowed due to surrounding adenopathy. There is a large amount of tumor along the left pleural surface and diaphragm, along with additional pleural deposits of tumor, and a loculated left hydropneumothorax with chest tube in place. 2. There is also a 9 by 6 mm right middle lobe nodule and a few small nodules in the right lower lobe which could be inflammatory or metastatic. 3. Secondary pulmonary lobular interstitial accentuation in the left lung, probably from lymphangitic spread of tumor or edema. 4. Suspected pericardial effusion especially along the cardiac apex, potentially with associated complexity. Malignant pericardial effusion not excluded. 5. Small amount of free pelvic fluid in the cul-de-sac, possibly physiologic but  technically nonspecific. 6. Prominent stool throughout the colon favors constipation. 7. Mild edema along the left lateral abdominal wall musculature, nonspecific and possibly related to the left chest tube. 8. The only finding of potential extra thoracic malignant involvement is a mildly enlarged right gastric lymph node (1.0 cm in diameter).  Hospital 10/2022 :  S/p thoracentesis 1/16 with 1.5L volume removed. Subsequent re-accumulation on CXR 1/17. Pleural fluid with LDH 583, glucose 76, TNC 1,994. Exudative by LDH (ratio 2.1). S/p chest tube placement. Cytology consistent with adenocarcinoma consistent with lung primary.   11/15/2022 Follow up ; Post hospital follow-up, metastatic stage IVb non-small cell lung cancer, adenocarcinoma, malignant recurrent pleural effusion, Pleurx catheter Patient presents for a posthospital follow-up.  Patient was recently hospitalized this month. Patient was treated in early January for suspected pneumonia with antibiotics, chest x-ray showed a lingular pneumonia.  She continued to have ongoing symptoms with progressive worsening shortness of breath.  She presented to the emergency room on January 15.  Chest x-ray showed near complete opacification on the left.  CT chest showed a large left pleural effusion.  Pulmonary and critical care was consulted.  Patient underwent thoracentesis and chest tube placement.  Cytology came back showing adenocarcinoma with consistent with lung primary.  Patient is a never smoker.  Patient was seen by medical oncology.  Patient had significant reaccumulation of effusion.  She required Pleurx catheter placed prior to discharge.  Since discharge patient remains very weak, low energy and nauseous.  She has been followed by palliative care.  Has been started on antinausea and pain medications.  Does request prescription for Zofran sublingual in case she cannot tolerate the tablets.  She is also being given transdermal Zofran.  Explained to  her not  to combine to use either or.  Nausea and low appetite are some of her significant symptom burden's.  Patient says she has minimum cough.  No significant shortness of breath.  Patient is draining her Pleurx daily typically getting around 600 to 625 cc.  She says if she goes over this she does have some pain with drainage.  Says no redness at Pleurx site.  She is not able to use the Tegaderm dressing as it causes significant skin irritation.  She is using gauze and a Ace wrap to keep in place. Home care has been set up.  Patient is following with medical oncology.  Plans to begin chemo with carboplatin, Alimta, Avastin on January 30.  Molecular studies are still pending. PET scan is planned for later today.  MRI showed no evidence of metastatic disease in the brain.  8 mm enhancing lesions in the right frontal calvarium concerning for osseous metastatic disease.  Stressful time , Married , has 73 year old.  Family/Friend is with her today in office .   Allergies  Allergen Reactions   Hydrocodone Nausea Only    Dizzy, "crawl out of my skin"   Tramadol Other (See Comments)    "Weird feeling"    Immunization History  Administered Date(s) Administered   Influenza Inj Mdck Quad Pf 07/25/2021   Influenza,inj,Quad PF,6+ Mos 08/19/2019, 11/24/2020   Moderna Sars-Covid-2 Vaccination 01/08/2020, 02/08/2020   Tdap 08/23/2021    Past Medical History:  Diagnosis Date   Anxiety    [employer] 11/21/2020    Tobacco History: Social History   Tobacco Use  Smoking Status Never  Smokeless Tobacco Never   Counseling given: Not Answered   Outpatient Medications Prior to Visit  Medication Sig Dispense Refill   benzonatate (TESSALON) 200 MG capsule Take 1 capsule (200 mg total) by mouth 3 (three) times daily as needed for cough. 20 capsule 0   ALPRAZolam (XANAX) 0.5 MG tablet Take 1 tablet (0.5 mg total) by mouth 3 (three) times daily as needed for anxiety. 60 tablet 0   Ascorbic Acid (VITAMIN  C) 1000 MG tablet Take 1,000 mg by mouth daily.     b complex vitamins capsule Take 1 capsule by mouth daily.     celecoxib (CELEBREX) 200 MG capsule Take 1 capsule (200 mg total) by mouth 2 (two) times daily. 30 capsule 0   cholecalciferol (VITAMIN D3) 25 MCG (1000 UNIT) tablet Take 5,000 Units by mouth daily.     dexamethasone (DECADRON) 6 MG tablet Take 1 tablet (6 mg total) by mouth every morning. Take 1/2 (3mg ) tablet on 1/23. Then take 1 tablet (6mg ) every AM starting on 1/24 or as directed. 10 tablet 0   escitalopram (LEXAPRO) 10 MG tablet Take 1 tablet (10 mg total) by mouth daily. 30 tablet 1   folic acid (FOLVITE) 1 MG tablet Take 1 tablet (1 mg total) by mouth daily. 30 tablet 4   guaiFENesin (MUCINEX) 600 MG 12 hr tablet Take 2 tablets (1,200 mg total) by mouth 2 (two) times daily. (Patient not taking: Reported on 11/15/2022) 60 tablet 0   ketorolac (TORADOL) 10 MG tablet Take 1 tablet (10 mg total) by mouth 3 (three) times daily. (Patient not taking: Reported on 11/15/2022) 9 tablet 0   levonorgestrel (MIRENA, 52 MG,) 20 MCG/DAY IUD 1 each by Intrauterine route once.     Magnesium Oxide -Mg Supplement 400 MG CAPS Take 400 mg by mouth 2 (two) times daily. 60 capsule 0   niacin (,  VITAMIN B3,) 500 MG tablet Take 500 mg by mouth at bedtime.     ondansetron (ZOFRAN) 8 MG tablet Take 1 tablet (8 mg total) by mouth every 8 (eight) hours as needed for nausea or vomiting. 20 tablet 0   Ondansetron 4 MG FILM Take 4-8 mg by mouth every 6 (six) hours as needed. (Patient not taking: Reported on 11/15/2022) 15 each 0   oxyCODONE (ROXICODONE) 5 MG immediate release tablet Take 1 tablet (5 mg total) by mouth every 4 (four) hours as needed for severe pain. 30 tablet 0   scopolamine (TRANSDERM-SCOP) 1 MG/3DAYS Place 1 patch (1.5 mg total) onto the skin every 3 (three) days. 3 patch 0   sodium chloride (OCEAN) 0.65 % SOLN nasal spray Place 1 spray into both nostrils as needed for congestion. 104 mL 0    Turmeric (QC TUMERIC COMPLEX PO) Take 500 mg by mouth daily.     zinc sulfate 220 (50 Zn) MG capsule Take 220 mg by mouth daily.     No facility-administered medications prior to visit.     Review of Systems:   Constitutional:   No  weight loss, night sweats,  Fevers, chills, fatigue, or  lassitude.  HEENT:   No headaches,  Difficulty swallowing,  Tooth/dental problems, or  Sore throat,                No sneezing, itching, ear ache, nasal congestion, post nasal drip,   CV:  No chest pain,  Orthopnea, PND, swelling in lower extremities, anasarca, dizziness, palpitations, syncope.   GI  No heartburn, indigestion, abdominal pain, nausea, vomiting, diarrhea, change in bowel habits, loss of appetite, bloody stools.   Resp: No shortness of breath with exertion or at rest.  No excess mucus, no productive cough,  No non-productive cough,  No coughing up of blood.  No change in color of mucus.  No wheezing.  No chest wall deformity  Skin: no rash or lesions.  GU: no dysuria, change in color of urine, no urgency or frequency.  No flank pain, no hematuria   MS:  No joint pain or swelling.  No decreased range of motion.  No back pain.    Physical Exam  BP 90/60 (BP Location: Left Arm)   Pulse 89   Ht 5\' 6"  (1.676 m)   Wt 135 lb (61.2 kg)   LMP 10/14/2022 (Exact Date) Comment: Mirena  SpO2 95%   BMI 21.79 kg/m   GEN: A/Ox3; pleasant , NAD, well nourished    HEENT:  [place]/AT,  EACs-clear, TMs-wnl, NOSE-clear, THROAT-clear, no lesions, no postnasal drip or exudate noted.   NECK:  Supple w/ fair ROM; no JVD; normal carotid impulses w/o bruits; no thyromegaly or nodules palpated; no lymphadenopathy.    RESP decreased breath sounds on the left  base.   no accessory muscle use, no dullness to percussion Left lateral chest wall Pleurx in place.  Site looks clean and dry with no significant redness.  Dressing clean and dry.  CARD:  RRR, no m/r/g, no peripheral edema, pulses intact, no  cyanosis or clubbing.  GI:   Soft & nt; nml bowel sounds; no organomegaly or masses detected.   Musco: Warm bil, no deformities or joint swelling noted.   Neuro: alert, no focal deficits noted.    Skin: Warm, no lesions or rashes    Lab Results:  CBC    Component Value Date/Time   WBC 17.2 (H) 11/12/2022 0750   WBC 14.6 (H)  11/09/2022 0513   RBC 4.90 11/12/2022 0750   HGB 14.3 11/12/2022 0750   HGB 12.6 11/24/2020 1106   HCT 42.2 11/12/2022 0750   HCT 38.0 11/24/2020 1106   PLT 545 (H) 11/12/2022 0750   PLT 314 11/24/2020 1106   MCV 86.1 11/12/2022 0750   MCV 92 11/24/2020 1106   MCH 29.2 11/12/2022 0750   MCHC 33.9 11/12/2022 0750   RDW 12.3 11/12/2022 0750   RDW 11.8 11/24/2020 1106   LYMPHSABS 1.4 11/12/2022 0750   LYMPHSABS 2.7 11/24/2020 1106   MONOABS 1.1 (H) 11/12/2022 0750   EOSABS 1.7 (H) 11/12/2022 0750   EOSABS 0.2 11/24/2020 1106   BASOSABS 0.1 11/12/2022 0750   BASOSABS 0.0 11/24/2020 1106    BMET    Component Value Date/Time   NA 135 11/12/2022 0750   NA 140 11/24/2020 1106   K 3.7 11/12/2022 0750   CL 101 11/12/2022 0750   CO2 27 11/12/2022 0750   GLUCOSE 109 (H) 11/12/2022 0750   BUN 5 (L) 11/12/2022 0750   BUN 7 11/24/2020 1106   CREATININE 0.64 11/12/2022 0750   CALCIUM 8.3 (L) 11/12/2022 0750   GFRNONAA >60 11/12/2022 0750   GFRAA 119 11/24/2020 1106    BNP    Component Value Date/Time   BNP 14.3 11/04/2022 1153    ProBNP No results found for: "PROBNP"  Imaging: DG Chest 2 View  Result Date: 11/15/2022 CLINICAL DATA:  Malignant pleural effusion. EXAM: CHEST - 2 VIEW COMPARISON:  AP chest 11/09/2022 and CT chest 11/07/2022 FINDINGS: Cardiac silhouette and mediastinal contours are within limits. Moderate left pleural effusion associated left basilar lung opacities, similar to prior 11/09/2022 radiograph and corresponding to the loculated pleural fluid within the left lung posterior, lateral, and major fissure pleura. The right  lung appears clear. Likely unchanged tiny left apical pneumothorax, better seen on prior CT. No acute skeletal abnormality. IMPRESSION: Moderate left pleural effusion associated left basilar lung opacities, similar to prior 11/09/2022 radiograph and corresponding to the loculated pleural fluid, lung atelectasis, and possible infrahilar lymphadenopathy/lung tumor better seen on prior CT. Electronically Signed   By: Yvonne Kendall M.D.   On: 11/15/2022 12:00   IR PERC PLEURAL DRAIN W/INDWELL CATH W/IMG GUIDE  Result Date: 11/09/2022 INDICATION: 37 year old with recently diagnosed adenocarcinoma and malignant left pleural effusion. Patient recently had a pigtail chest tube removed and now has re-accumulation of left pleural fluid. Plan for placement of a tunneled left pleural catheter. EXAM: PLACEMENT OF TUNNELED LEFT PLEURAL CATHETER USING ULTRASOUND AND FLUOROSCOPIC GUIDANCE MEDICATIONS: Ancef 2 g ANESTHESIA/SEDATION: Versed 2 mg Patient was monitored by a radiology nurse throughout the procedure. COMPLICATIONS: None immediate. PROCEDURE: Informed written consent was obtained from the patient after a thorough discussion of the procedural risks, benefits and alternatives. All questions were addressed. Maximal Sterile Barrier Technique was utilized including caps, mask, sterile gowns, sterile gloves, sterile drape, hand hygiene and skin antiseptic. A timeout was performed prior to the initiation of the procedure. Patient was placed on her right side. Ultrasound was used to evaluate the left pleural fluid. The left mid axillary region was prepped and draped in sterile fashion. Two areas in the left lateral lower chest were anesthetized using 1% lidocaine with epinephrine. Two small incisions were made. The PleurX catheter was tunneled between these incisions and the cuff was placed underneath the skin. Using ultrasound guidance, pleural catheter was directed into the pleural space through the more lateral incision.  Pleural catheter was confirmed within  the pleural fluid using ultrasound and amber colored fluid was aspirated. Wire was advanced into the pleural space and the tract was dilated to accommodate the peel-away sheath. Pleural catheter was advanced through the peel-away sheath and into the pleural space. Approximately 600 mL of amber colored fluid was removed from the left pleural space. The pleural entrance dermatotomy site was closed using absorbable suture and Dermabond. Catheter was secured to the skin with suture. Dressings were placed. Fluoroscopic and ultrasound images were taken and saved for documentation. FINDINGS: Large pocket of pleural fluid in the left lower chest. However, there is very complex loculated left pleural fluid as well. The largest pocket in left lower chest was targeted for the PleurX catheter placement. Approximately 600 mL of fluid was removed. IMPRESSION: Successful placement of a tunneled left pleural catheter using ultrasound and fluoroscopic guidance. Electronically Signed   By: Markus Daft M.D.   On: 11/09/2022 17:37   DG Chest Port 1 View  Result Date: 11/09/2022 CLINICAL DATA:  Non-small-cell lung cancer.  Drain placement. EXAM: PORTABLE CHEST 1 VIEW COMPARISON:  11/09/2022, earlier same day FINDINGS: Diffuse interstitial opacity in the left lung is similar to prior with persistent left base collapse/consolidation and moderate left pleural effusion. Hazy opacity over the left apex may represent fluid in the fissure or loculated posterior pleural effusions seen on previous CT imaging. Left pleural drain is new in the interval. Right lung remains clear. Cardiopericardial silhouette is at upper limits of normal for size. The visualized bony structures of the thorax are unremarkable. IMPRESSION: Interval placement of left pleural drain with persistent left base collapse/consolidation and moderate left pleural effusion. Electronically Signed   By: Misty Stanley M.D.   On: 11/09/2022  12:55   [place] CHEST (PLEURAL EFFUSION)  Result Date: 11/09/2022 CLINICAL DATA:  Evaluate pleural effusion. EXAM: CHEST ULTRASOUND COMPARISON:  Chest radiograph 11/09/2022 FINDINGS: There is a moderate amount of complicated fluid demonstrated within the left chest compatible with pleural effusion. No significant right pleural effusion identified. IMPRESSION: Moderate left pleural effusion. Electronically Signed   By: Lovey Newcomer M.D.   On: 11/09/2022 09:51   DG CHEST PORT 1 VIEW  Result Date: 11/09/2022 CLINICAL DATA:  Pleural effusion. EXAM: PORTABLE CHEST 1 VIEW COMPARISON:  11/08/2022 FINDINGS: Patient is slightly rotated to the right. Interval removal of left basilar pigtail pleural drainage catheter. Stable to slight interval worsening of large left pleural effusion with loculation over the left upper lobe/apex. No definite left apical pneumothorax. Right lung is clear. Cardiomediastinal silhouette and remainder of the exam is unchanged. IMPRESSION: Stable to slight interval worsening of large left pleural effusion with loculation over the left upper lobe/apex. Interval removal of left basilar pigtail pleural drainage catheter. No definite left pneumothorax. Electronically Signed   By: Marin Olp M.D.   On: 11/09/2022 08:11   DG CHEST PORT 1 VIEW  Result Date: 11/08/2022 CLINICAL DATA:  Pleural effusion. EXAM: PORTABLE CHEST 1 VIEW COMPARISON:  AP chest 11/07/2022 at 4:54 a.m. FINDINGS: Unchanged position of left basilar pigtail chest tube. Mild-to-moderate left pleural effusion with associated left midlung heterogeneous opacities, unchanged. The right lung is clear. Note is made of small left apical pneumothorax, unchanged from 11/07/2022 radiograph and better seen on most recent study of 11/07/2022 chest CT. The visualized cardiac silhouette and mediastinal contours are within normal limits. No acute skeletal abnormality. IMPRESSION: 1. Unchanged position of left basilar pigtail chest tube. 2. Left  basilar lower lung opacities, representing a combination of pleural effusion,  atelectasis, and left perihilar and lower lobe lung tumor better seen on yesterday's chest CT. 3. Unchanged small left apical pneumothorax. Electronically Signed   By: Yvonne Kendall M.D.   On: 11/08/2022 08:23   MR BRAIN W WO CONTRAST  Result Date: 11/08/2022 CLINICAL DATA:  Non-small cell lung cancer, staging EXAM: MRI HEAD WITHOUT AND WITH CONTRAST TECHNIQUE: Multiplanar, multiecho pulse sequences of the brain and surrounding structures were obtained without and with intravenous contrast. CONTRAST:  18mL GADAVIST GADOBUTROL 1 MMOL/ML IV SOLN COMPARISON:  None Available. FINDINGS: Brain: No abnormal parenchymal or meningeal enhancement. No restricted diffusion to suggest acute or subacute infarct. No acute hemorrhage, mass, mass effect, or midline shift. No hydrocephalus or extra-axial collection. No hemosiderin deposition to suggest remote hemorrhage. Normal pituitary. Low lying cerebellar tonsils, which extend 3 mm below the foramen magnum and do not meet criteria for Chiari I malformation. Vascular: Patent arterial flow voids. Skull and upper cervical spine: 8 mm enhancing lesions in the right frontal calvarium (series 16, image 129 and 135). Sinuses/Orbits: Clear paranasal sinuses. No acute finding in the orbits. Other: The mastoid air cells are well aerated. IMPRESSION: 1. No evidence of metastatic disease in the brain. 2. 8 mm enhancing lesions in the right frontal calvarium, concerning for osseous metastatic disease in the setting of primary lung cancer. Electronically Signed   By: Merilyn Baba M.D.   On: 11/08/2022 01:32   CT CHEST ABDOMEN PELVIS W CONTRAST  Result Date: 11/07/2022 CLINICAL DATA:  Non-small cell lung cancer staging * Tracking Code: BO * EXAM: CT CHEST, ABDOMEN, AND PELVIS WITH CONTRAST TECHNIQUE: Multidetector CT imaging of the chest, abdomen and pelvis was performed following the standard protocol during  bolus administration of intravenous contrast. RADIATION DOSE REDUCTION: This exam was performed according to the departmental dose-optimization program which includes automated exposure control, adjustment of the mA and/or kV according to patient size and/or use of iterative reconstruction technique. CONTRAST:  147mL OMNIPAQUE IOHEXOL 300 MG/ML  SOLN COMPARISON:  11/04/2022 CT chest, and CT abdomen from 10/09/2011 FINDINGS: CT CHEST FINDINGS Cardiovascular: Suspected pericardial effusion especially along the cardiac apex, potentially with associated complexity as on image 44 series 2. Mediastinum/Nodes: Bulky but indistinctly marginated right and left paratracheal, AP window, prevascular, left internal mammary, subcarinal, left hilar, and left infrahilar adenopathy. Index right paratracheal node 1.9 cm in short axis, image 22 series 2. Index left internal mammary node 1.4 cm in short axis, image 24 series 2. The hilar and infrahilar adenopathy is somewhat conglomerate and difficult to individually measure. AP window lymph node 1.4 cm in short axis, image 24 series 2. Lungs/Pleura: Substantially reduced size of the left pleural effusion with a left-sided chest tube in place. Scattered loculations of gas in the left pleural space along with loculations of fluid along the major fissure and posteriorly. The left lower lobe bronchus is essentially occluded by central adenopathy in tumor, with the left upper lobe bronchus narrowed due to surrounding adenopathy. Complex appearance at the left lung base likely due to a combination of tumor in atelectatic lung for example on image 69 series 4. Suspected extensive tumor versus adherent atelectatic lung along the left diaphragm and pleural surface. Irregular deposition of tumor posteriorly along the pleural and diaphragmatic margin for example on image 95 series 4. It is difficult to confidently measure tumor separate from atelectatic lung but suspected tumor along the  diaphragmatic surface measures about 9.9 by 6.4 cm on image 47 series 2. Secondary pulmonary lobular interstitial accentuation  throughout the left lung. Small left apical hydropneumothorax with air-fluid level. Scattered ground-glass opacity in the aerated portions of the left lower lobe. Right apical pleuroparenchymal scarring. Right middle lobe nodule 0.9 by 0.6 cm, image 103 series 6. In the right lower lobe there are a few nodules measuring up to about 5 mm in diameter which could be inflammatory or metastatic. Musculoskeletal: Unremarkable CT ABDOMEN PELVIS FINDINGS Hepatobiliary: Unremarkable Pancreas: Unremarkable Spleen: Unremarkable Adrenals/Urinary Tract: No adrenal mass. The kidneys appear unremarkable. There is contrast medium in the collecting systems which could obscure nonobstructive calculi, but there is no hydronephrosis or hydroureter. Stomach/Bowel: Prominent stool throughout the colon favors constipation. Vascular/Lymphatic: A right gastric lymph node measures 10 mm in short axis on image 56 series 2. Reproductive: An IUD is satisfactorily positioned along the endometrium. Adnexa unremarkable. Other: There is a small amount of right eccentric free pelvic fluid in the cul-de-sac on image 103 series 2, possibly physiologic but technically nonspecific. No well-defined nodularity along the omentum or peritoneal surfaces. Musculoskeletal: Mild edema tracks along the left lateral abdominal wall musculature, nonspecific and possibly related to the left chest tube. No specific indicators of osseous metastatic disease. IMPRESSION: 1. Bulky mediastinal, left hilar, and left infrahilar adenopathy compatible with malignancy. The left lower lobe bronchus is occluded and the left upper lobe bronchus is narrowed due to surrounding adenopathy. There is a large amount of tumor along the left pleural surface and diaphragm, along with additional pleural deposits of tumor, and a loculated left hydropneumothorax with  chest tube in place. 2. There is also a 9 by 6 mm right middle lobe nodule and a few small nodules in the right lower lobe which could be inflammatory or metastatic. 3. Secondary pulmonary lobular interstitial accentuation in the left lung, probably from lymphangitic spread of tumor or edema. 4. Suspected pericardial effusion especially along the cardiac apex, potentially with associated complexity. Malignant pericardial effusion not excluded. 5. Small amount of free pelvic fluid in the cul-de-sac, possibly physiologic but technically nonspecific. 6. Prominent stool throughout the colon favors constipation. 7. Mild edema along the left lateral abdominal wall musculature, nonspecific and possibly related to the left chest tube. 8. The only finding of potential extra thoracic malignant involvement is a mildly enlarged right gastric lymph node (1.0 cm in diameter). Electronically Signed   By: Van Clines M.D.   On: 11/07/2022 21:54   DG CHEST PORT 1 VIEW  Result Date: 11/07/2022 CLINICAL DATA:  Pleural effusion on left. EXAM: PORTABLE CHEST 1 VIEW COMPARISON:  AP chest 11/06/2022, CT chest 11/04/2022 FINDINGS: Redemonstration of left basilar pigtail chest tube. Mild interval improvement in now mild-to-moderate left pleural effusion, previously extending 60% up the left hemithorax and now extending 30-40% up the left hemithorax. Associated left midlung linear densities. Minimal right costophrenic angle linear subsegmental atelectasis. No pneumothorax. Visualized cardiac silhouette and mediastinal contours are within normal limits. No acute skeletal abnormality. IMPRESSION: Redemonstration of left basilar pigtail chest tube. Mild interval improvement in now mild-to-moderate left pleural effusion Electronically Signed   By: Yvonne Kendall M.D.   On: 11/07/2022 08:29   DG CHEST PORT 1 VIEW  Result Date: 11/06/2022 CLINICAL DATA:  Chest tube placement.  [id number] EXAM: PORTABLE CHEST 1 VIEW COMPARISON:   11/06/2022 at 4:18 a.m. FINDINGS: There is a large left pleural effusion with basilar atelectasis or consolidation. Similar appearance to prior study. A left chest tube has been placed in the lower chest region. The right lung is clear. Heart size is obscured  by the left pleural/parenchymal process. No pneumothorax. IMPRESSION: Large left pleural effusion with infiltration or atelectasis in the left base. Interval placement of a left chest tube. No pneumothorax. Electronically Signed   By: Lucienne Capers M.D.   On: 11/06/2022 17:49   DG CHEST PORT 1 VIEW  Result Date: 11/06/2022 CLINICAL DATA:  Follow-up pleural effusion. EXAM: PORTABLE CHEST 1 VIEW COMPARISON:  11/05/22 FINDINGS: Stable cardiomediastinal contours. There is a large left pleural effusion which appears unchanged in volume from the previous exam. Right lung appears clear. No new findings. IMPRESSION: No change in large left pleural effusion. Electronically Signed   By: Kerby Moors M.D.   On: 11/06/2022 08:15   DG CHEST PORT 1 VIEW  Result Date: 11/05/2022 CLINICAL DATA:  Post thoracentesis. EXAM: PORTABLE CHEST 1 VIEW COMPARISON:  Chest x-ray from earlier same day. FINDINGS: Decreased size of the LEFT pleural effusion status post thoracentesis. Large opacity persists at the LEFT lung base, likely residual pleural effusion and/or atelectasis/airspace collapse. RIGHT lung is clear. No pneumothorax is seen. Heart size and mediastinal contours are grossly stable. IMPRESSION: 1. Decreased size of the LEFT pleural effusion status post thoracentesis. No pneumothorax. 2. Large opacity persists at the LEFT lung base, likely residual pleural effusion and/or atelectasis/airspace collapse. Electronically Signed   By: Franki Cabot M.D.   On: 11/05/2022 13:24   DG CHEST PORT 1 VIEW  Result Date: 11/05/2022 CLINICAL DATA:  Short of breath EXAM: PORTABLE CHEST 1 VIEW COMPARISON:  11/04/2022 FINDINGS: LEFT cardiac silhouette is obscured large LEFT  effusion. LEFT pleural effusion occupies 75% of LEFT hemithorax and is increased comparison exam. RIGHT lung clear. No pneumothorax. IMPRESSION: Increasing large LEFT effusion occupying 75% of the LEFT hemithorax. Electronically Signed   By: Suzy Bouchard M.D.   On: 11/05/2022 10:14   CT Angio Chest PE W and/or Wo Contrast  Result Date: 11/04/2022 CLINICAL DATA:  Pneumonia and worsening shortness of breath EXAM: CT ANGIOGRAPHY CHEST WITH CONTRAST TECHNIQUE: Multidetector CT imaging of the chest was performed using the standard protocol during bolus administration of intravenous contrast. Multiplanar CT image reconstructions and MIPs were obtained to evaluate the vascular anatomy. RADIATION DOSE REDUCTION: This exam was performed according to the departmental dose-optimization program which includes automated exposure control, adjustment of the mA and/or kV according to patient size and/or use of iterative reconstruction technique. CONTRAST:  45mL OMNIPAQUE IOHEXOL 350 MG/ML SOLN COMPARISON:  Chest radiograph dated 10/30/2022 FINDINGS: Cardiovascular: The study is high quality for the evaluation of pulmonary embolism. There are no filling defects in the central, lobar, segmental or subsegmental pulmonary artery branches to suggest acute pulmonary embolism. Great vessels are normal in course and caliber. Normal heart size. No significant pericardial fluid/thickening. Mediastinum/Nodes: Imaged thyroid gland without nodules meeting criteria for imaging follow-up by size. Rightward shift of the mediastinum. Normal esophagus. Multi station lymphadenopathy including 1.3 cm left internal mammary lymph node(5:98), 2.0 cm subcarinal lymph node (4:59), and 1.4 cm prevascular lymph node (4:51). Lungs/Pleura: The central airways are patent. Near-complete atelectasis of the lingula and left lower lobe with heterogeneous consolidation resulting in marked narrowing of the left sided airways. Dependent ground-glass opacity in  the left upper lobe. No pneumothorax. Large left pleural effusion. Upper abdomen: Normal. Musculoskeletal: No acute or abnormal lytic or blastic osseous lesions. Review of the MIP images confirms the above findings. IMPRESSION: 1. No acute pulmonary embolism. 2. Near-complete atelectasis of the lingula and left lower lobe with heterogeneous consolidation resulting in marked narrowing of the  left sided airways, likely multifocal pneumonia. 3. Large left pleural effusion. 4. Multi station mediastinal lymphadenopathy likely reactive Electronically Signed   By: Darrin Nipper M.D.   On: 11/04/2022 14:51   DG Chest Port 1 View  Result Date: 11/04/2022 CLINICAL DATA:  Provided history: Left chest pain. EXAM: PORTABLE CHEST 1 VIEW COMPARISON:  Prior chest radiograph 10/30/2022. FINDINGS: Significant interval increase in size of a left pleural effusion, now moderate-to-large. Underlying atelectasis and/or airspace consolidation within the mid and lower left lung, also significantly progressed. No appreciable airspace consolidation on the right. Portions of the cardiac silhouette are obscured on the left, limiting evaluation of heart size. No evidence of pneumothorax. No acute bony abnormality identified. IMPRESSION: Significant interval increase in size of a left pleural effusion, now moderate-to-large. Underlying atelectasis and/or airspace consolidation within the mid and lower left lung has also significantly progressed from the prior examination of 10/30/2022. Electronically Signed   By: Kellie Simmering D.O.   On: 11/04/2022 12:22   DG Chest Portable 1 View  Result Date: 10/30/2022 CLINICAL DATA:  Cough, headache, fatigue, night sweats. EXAM: PORTABLE CHEST 1 VIEW COMPARISON:  Overlapping portions CT abdomen 10/09/2011 FINDINGS: Left lower lobe and possible lingular consolidation. Pleural thickening along the left lateral costophrenic angle may reflect adjacent pleural effusion. The right lung appears clear. Cardiac and  mediastinal margins appear normal. IMPRESSION: 1. Left lower lobe and possible lingular consolidation with small left pleural effusion. Findings favor left basilar pneumonia over atelectasis. Followup PA and lateral chest X-ray is recommended in 3-4 weeks following trial of antibiotic therapy to ensure resolution and exclude the unlikely possibility of underlying malignancy. Electronically Signed   By: Van Clines M.D.   On: 10/30/2022 10:54    cyanocobalamin (VITAMIN B12) injection 1,000 mcg     Date Action Dose Route User   11/12/2022 [phone] Given 1,000 mcg Intramuscular (Left Deltoid) Gobble, Jasmine A, LPN           No data to display          No results found for: "NITRICOXIDE"      Assessment & Plan:   Malignant pleural effusion Recurrent left malignant pleural effusion in the setting of stage IV adenocarcinoma of the left lung-status post Pleurx catheter placed November 09, 2022.  Patient is draining well.  Seems to be to breathing.  Chest x-ray shows stable moderate left effusion.  Continue with daily Pleurx care.  Daily drainage. Will have her return next week.  Will remove Pleurx suture.   Plan  Patient Instructions  Pleurx care as discussed  Pleurx drainage daily .  Activity as tolerated.  Keep follow up with Oncology as planned  Follow up in 1 week with Dr. Valeta Harms or Tashica Provencio NP and As needed       Adenocarcinoma of left lung, stage 4 ([place]) Stage IVb non-small cell lung cancer adenocarcinoma-large left diaphragmatic surface mass in addition significant lymphadenopathy and large left malignant pleural effusion and right frontal osseous metastatic disease Continue follow-up with oncology.  Plans for upcoming chemotherapy.  Molecular studies are pending.  PET scan is pending for later today Palliative care following . Continue with symptom burden management  Plan  Patient Instructions  Pleurx care as discussed  Pleurx drainage daily .  Activity as tolerated.   Keep follow up with Oncology as planned  Follow up in 1 week with Dr. Valeta Harms or Trevian Hayashida NP and As needed       I spent  42  minutes  dedicated to the care of this patient on the date of this encounter to include pre-visit review of records, face-to-face time with the patient discussing conditions above, post visit ordering of testing, clinical documentation with the electronic health record, making appropriate referrals as documented, and communicating necessary findings to members of the patients care team.    Rexene Edison, NP 11/15/2022

## 2022-11-15 NOTE — Telephone Encounter (Signed)
Called patient to confirm upcoming appointments. Patient notified. Patient also had concerns with medications and requested to speak with MD or RN. Forwarded information to [employer].

## 2022-11-15 NOTE — Telephone Encounter (Signed)
Called and spoke with pt and scheduled her OV with TP at date that worked best for her. Made a comment that TP stated that it was okay to DB pt. Nothing further needed.

## 2022-11-15 NOTE — Telephone Encounter (Signed)
patient has some concerns with what medications she can and can't take based off what Dr. Julien Nordmann has spoken with her about and she has some additional questions   Spoke with pt and she saw Dr Hilma Favors on 1/23. Some of her medications were changed and new ones added.  Pt is concerned if in preparation of chemo if it is OK if she takes the new medications, Magnesium Oxide and Celebrex  She has an appt with Palliative Care on 1/29 and wants to review her all medications at that time.

## 2022-11-15 NOTE — Telephone Encounter (Signed)
Plan from [place] 1/26 Patient Instructions  Pleurx care as discussed  Pleurx drainage daily .  Activity as tolerated.  Keep follow up with Oncology as planned  Follow up in 1 week with Dr. Valeta Harms or [place] NP and As needed      Routing the message from front staff to both Tammy as well as Dr. Valeta Harms for them to review the message from pt. Please advise if either of you would be okay with double booking pt on your schedule.

## 2022-11-17 ENCOUNTER — Other Ambulatory Visit: Payer: Self-pay | Admitting: Internal Medicine

## 2022-11-17 DIAGNOSIS — C3490 Malignant neoplasm of unspecified part of unspecified bronchus or lung: Secondary | ICD-10-CM | POA: Diagnosis not present

## 2022-11-17 DIAGNOSIS — C782 Secondary malignant neoplasm of pleura: Secondary | ICD-10-CM | POA: Diagnosis not present

## 2022-11-18 ENCOUNTER — Encounter (HOSPITAL_COMMUNITY): Payer: Self-pay | Admitting: Obstetrics & Gynecology

## 2022-11-18 ENCOUNTER — Telehealth: Payer: Self-pay | Admitting: Obstetrics and Gynecology

## 2022-11-18 ENCOUNTER — Encounter: Payer: Self-pay | Admitting: Internal Medicine

## 2022-11-18 ENCOUNTER — Inpatient Hospital Stay: Payer: BC Managed Care – PPO | Admitting: Nurse Practitioner

## 2022-11-18 ENCOUNTER — Telehealth: Payer: Self-pay | Admitting: Medical Oncology

## 2022-11-18 ENCOUNTER — Inpatient Hospital Stay (HOSPITAL_COMMUNITY): Payer: BC Managed Care – PPO

## 2022-11-18 ENCOUNTER — Encounter (HOSPITAL_COMMUNITY): Payer: Self-pay | Admitting: Internal Medicine

## 2022-11-18 ENCOUNTER — Inpatient Hospital Stay (HOSPITAL_COMMUNITY)
Admission: AD | Admit: 2022-11-18 | Discharge: 2022-11-18 | Disposition: A | Discharge status: Home / Self Care | Payer: BC Managed Care – PPO | Attending: Obstetrics & Gynecology | Admitting: Obstetrics & Gynecology

## 2022-11-18 DIAGNOSIS — O3680X0 Pregnancy with inconclusive fetal viability, not applicable or unspecified: Secondary | ICD-10-CM | POA: Insufficient documentation

## 2022-11-18 DIAGNOSIS — C3490 Malignant neoplasm of unspecified part of unspecified bronchus or lung: Secondary | ICD-10-CM | POA: Diagnosis not present

## 2022-11-18 DIAGNOSIS — Z3A00 Weeks of gestation of pregnancy not specified: Secondary | ICD-10-CM | POA: Insufficient documentation

## 2022-11-18 DIAGNOSIS — C3492 Malignant neoplasm of unspecified part of left bronchus or lung: Secondary | ICD-10-CM | POA: Diagnosis not present

## 2022-11-18 DIAGNOSIS — R7989 Other specified abnormal findings of blood chemistry: Secondary | ICD-10-CM

## 2022-11-18 LAB — HCG, QUANTITATIVE, PREGNANCY: hCG, Beta Chain, Quant, S: 14 m[IU]/mL — ABNORMAL HIGH (ref <5)

## 2022-11-18 MED ORDER — FAMOTIDINE 20 MG PO TABS: 20.0000 mg | ORAL_TABLET | Freq: Once | ORAL | Status: DC

## 2022-11-18 MED ORDER — METOCLOPRAMIDE HCL 5 MG PO TABS: 5.0000 mg | ORAL_TABLET | Freq: Four times a day (QID) | ORAL | 1 refills | Status: DC | PRN

## 2022-11-18 NOTE — Telephone Encounter (Signed)
Prescriber not at this practice Requested Prescriptions  Pending Prescriptions Disp Refills   magnesium oxide (MAG-OX) 400 (240 Mg) MG tablet [Pharmacy Med Name: MAGNESIUM OXIDE 400 MG TABLET] 60 tablet     Sig: TAKE 1 TABLET BY MOUTH TWICE A DAY     There is no refill protocol information for this order

## 2022-11-18 NOTE — Telephone Encounter (Signed)
Pt advised since her chemo is cancelled she can now try the celebrex.  Pt agreed with this plan.

## 2022-11-18 NOTE — MAU Provider Note (Signed)
History     CSN: [id number]  Arrival date and time: 11/18/22 1955   Event Date/Time   First Provider Initiated Contact with Patient 11/18/22 2034      Chief Complaint  Patient presents with   Possible Pregnancy   HPI Melissa Pineda is a 37 y.o. year old [id number] female who presents to MAU reporting she is being seen by [facility] for stage 4 lung cancer. During routine blood work today, she had a positive qualitative HCG. The oncologist wants this possible pregnancy confirmed by quantitative HCG and ultrasound. She reports no pelvic pain, VB or abnormal vaginal discharge. She receives GYN care with [facility] (Dr. Rogue Bussing). She called [facility] today and was told she could come to get HCG drawn, but that an U/S would not be ordered until HCG level is verified to be high enough. She and her spouse decided to come to MAU for HCG and U/S to r/o pregnancy. Her spouse is present and contributing to the history taking.   OB History     Gravida  1   Para  1   Term  1   Preterm      AB      Living  1      SAB      IAB      Ectopic      Multiple  0   Live Births  1           Past Medical History:  Diagnosis Date   Anxiety    [employer] 11/21/2020    Past Surgical History:  Procedure Laterality Date   IR PERC PLEURAL DRAIN W/INDWELL CATH W/IMG GUIDE  11/09/2022   WISDOM TOOTH EXTRACTION      Family History  Problem Relation Age of Onset   Cancer Mother    Cancer Maternal Grandmother    Cancer Maternal Grandfather    Hypertension Paternal Grandmother    Cancer Paternal Grandfather     Social History   Tobacco Use   Smoking status: Never   Smokeless tobacco: Never  Vaping Use   Vaping Use: Never used  Substance Use Topics   Alcohol use: Yes    Alcohol/week: 7.0 standard drinks of alcohol    Types: 7 Glasses of wine per week    Comment: 1 glass wine in the evening    Drug use: Yes    Types: Marijuana    Comment: occasional    Allergies:   Allergies  Allergen Reactions   Hydrocodone Nausea Only    Dizzy, "crawl out of my skin"   Tramadol Other (See Comments)    "Weird feeling"    No medications prior to admission.    Review of Systems  Constitutional: Negative.   HENT: Negative.    Eyes: Negative.   Respiratory:  Positive for chest tightness.   Cardiovascular:  Positive for chest pain.  Endocrine: Negative.   Genitourinary: Negative.   Musculoskeletal: Negative.   Skin: Negative.   Allergic/Immunologic: Negative.   Neurological: Negative.   Hematological: Negative.   Psychiatric/Behavioral: Negative.     Physical Exam   Blood pressure 100/60, pulse (!) 119, temperature 98.9 F (37.2 C), temperature source Oral, resp. rate (!) 22, height 5\' 6"  (1.676 m), last menstrual period 10/14/2022, SpO2 99 %, not currently breastfeeding.  Physical Exam Vitals and nursing note reviewed.  Constitutional:      Appearance: Normal appearance. She is normal weight.  Pulmonary:     Effort: Tachypnea and respiratory distress  present.  Musculoskeletal:        General: Normal range of motion.  Neurological:     Mental Status: She is alert and oriented to person, place, and time.  Psychiatric:        Mood and Affect: Mood normal.        Behavior: Behavior normal.        Thought Content: Thought content normal.        Judgment: Judgment normal.    MAU Course  Procedures  MDM HCG OB U/S< 14 wks  Results for orders placed or performed during the hospital encounter of 11/18/22 (from the past 24 hour(s))  hCG, quantitative, pregnancy     Status: Abnormal   Collection Time: 11/18/22  8:27 PM  Result Value Ref Range   hCG, Beta Chain, Quant, S 14 (H) <5 mIU/mL    US OB LESS THAN 14 WEEKS WITH OB TRANSVAGINAL  Result Date: 11/18/2022 CLINICAL DATA:  Positive urine pregnancy test, IUD placement, evaluate for viability, cancer treatment candidate EXAM: OBSTETRIC <14 WK ULTRASOUND TECHNIQUE: Transabdominal ultrasound was  performed for evaluation of the gestation as well as the maternal uterus and adnexal regions. COMPARISON:  None Available. FINDINGS: Intrauterine gestational sac: None Yolk sac:  Not Visualized. Embryo:  Not Visualized. Cardiac Activity: Not Visualized. Heart Rate: Not visualized. Subchorionic hemorrhage:  None visualized. Maternal uterus/adnexae: IUD positioned in the fundal endometrial cavity. IMPRESSION: 1. No candidate intrauterine gestation identified by ultrasound. No ultrasound evidence of adnexal mass or other findings to suggest ectopic pregnancy. Early pregnancy of unknown location. Recommend ectopic precautions and serial beta HCG to resolution. 2.  IUD positioned in the fundal endometrial cavity. Electronically Signed   By: Delanna Ahmadi M.D.   On: 11/18/2022 21:04     *Consult with Dr. Harolyn Rutherford @ 2155 - notified of patient's complaints, assessments, lab & U/S results, recommended tx plan repeat HCG level in 48 hours at [facility], if HCG level still very low level it is very likely the HCG is elevated due to malignancy in her body   Assessment and Plan  1. Elevated serum hCG in female, not pregnant - See telephone encounter - Patient advised to call [facility] to schedule appt for HCG level on Wednesday 11/20/2022 - Advised that Oncologist with [facility] will be able to access the records and notes from [place] - TC to Dr. Terri Piedra (on-call MD) @ 2205 to notify of patient's lab results and f/u needs>>note made in patient's office chart by Dr. Sherilyn Cooter, CNM 11/19/2022, 8:34 PM

## 2022-11-18 NOTE — Telephone Encounter (Signed)
TC to notify patient of HCG, U/S results and recommendation of Dr. Harolyn Rutherford to repeat HCG at [facility] in 48 hours. Advised patient to call their office in the morning to inform them of the recommended plan. Patient advised that her oncologist will have access to the HCG and U/S results through [place].  TC to Dr. Terri Piedra to notify an leave a message for Dr. Rogue Bussing.  Laury Deep, CNM  11/18/2022 10:05 PM

## 2022-11-18 NOTE — Progress Notes (Signed)
Patient was seen at [facility] for second opinion today re: metastatic lung cancer. They are recommending treatment with oral immunotherapy. Treatment initiation delayed because of +urine pregnancy test. Per patient she needs blood and [place] confirmation of no pregnancy in order to proceed with treatment. She had a negative serum HCG on 1/15, likely false positive and due to tumor expression. Recommended that she go to [facility] ED for evaluation given her high symptom burden and urgent need to start tx for her cancer.   Lane Hacker, DO Palliative Medicine

## 2022-11-18 NOTE — MAU Note (Signed)
.  Melissa Pineda is a 37 y.o. at Unknown here in MAU reporting: Pt seen for oncology in [place] for stage 4 lung cancer, told to come to MAU for labs and [place] for confirmation to determine treatment possible. Pt had a HCG that was elevated today.  LMP: pt has a IUD  Onset of complaint:  Pain score: 7/10 chest chronic  Vitals:   11/18/22 2029  BP: 100/60  Pulse: (!) 119  Resp: (!) 24  Temp: 98.9 F (37.2 C)  SpO2: 99%      Lab orders placed from triage:

## 2022-11-18 NOTE — Telephone Encounter (Signed)
Faxed full moleculars to [facility].

## 2022-11-18 NOTE — Progress Notes (Deleted)
[place]  [phone] [phone]   Name: Melissa Pineda Date: 11/18/2022 MRN: [id number]  DOB: [birth year]  Patient Care Team: Patient, No Pcp Per as PCP - General (Melissa Pineda) Acquanetta Chain, DO as Consulting Physician [facility] and Palliative Medicine)    REASON FOR CONSULTATION: Melissa Pineda is a 37 y.o. female with oncologic medical history including non-small cell lung cancer (10/2022). Status post left Pleurx catheter placement for drainage of recurrent left pleural effusion. Palliative ask to see for symptom management and goals of care.    SOCIAL HISTORY:     reports that she has never smoked. She has never used smokeless tobacco. She reports current alcohol use of about 7.0 standard drinks of alcohol per week. She reports current drug use. Drug: Marijuana.  ADVANCE DIRECTIVES:  None on file  CODE STATUS: Full code  PAST MEDICAL HISTORY: Past Medical History:  Diagnosis Date   Anxiety    [employer] 11/21/2020    PAST SURGICAL HISTORY:  Past Surgical History:  Procedure Laterality Date   IR PERC PLEURAL DRAIN W/INDWELL CATH W/IMG GUIDE  11/09/2022   WISDOM TOOTH EXTRACTION      HEMATOLOGY/ONCOLOGY HISTORY:  Oncology History  Adenocarcinoma of left lung, stage 4 ([place])  11/07/2022 Initial Diagnosis   Adenocarcinoma of left lung, stage 4 ([place])   11/20/2022 - 11/20/2022 Chemotherapy   Patient is on Treatment Plan : LUNG Pemetrexed + Carboplatin + Bevacizumab q21d        ALLERGIES:  is allergic to hydrocodone and tramadol.  MEDICATIONS:  Current Outpatient Medications  Medication Sig Dispense Refill   ALPRAZolam (XANAX) 0.5 MG tablet Take 1 tablet (0.5 mg total) by mouth 3 (three) times daily as needed for anxiety. 60 tablet 0   Ascorbic Acid (VITAMIN C) 1000 MG tablet Take 1,000 mg by mouth daily.     b complex vitamins capsule Take 1 capsule by mouth daily.     benzonatate (TESSALON) 200 MG  capsule Take 1 capsule (200 mg total) by mouth 3 (three) times daily as needed for cough. 20 capsule 0   celecoxib (CELEBREX) 200 MG capsule Take 1 capsule (200 mg total) by mouth 2 (two) times daily. 30 capsule 0   cholecalciferol (VITAMIN D3) 25 MCG (1000 UNIT) tablet Take 5,000 Units by mouth daily.     dexamethasone (DECADRON) 6 MG tablet Take 1 tablet (6 mg total) by mouth every morning. Take 1/2 ('3mg'$ ) tablet on 1/23. Then take 1 tablet ('6mg'$ ) every AM starting on 1/24 or as directed. 10 tablet 0   escitalopram (LEXAPRO) 10 MG tablet Take 1 tablet (10 mg total) by mouth daily. 30 tablet 1   folic acid (FOLVITE) 1 MG tablet Take 1 tablet (1 mg total) by mouth daily. 30 tablet 4   guaiFENesin (MUCINEX) 600 MG 12 hr tablet Take 2 tablets (1,200 mg total) by mouth 2 (two) times daily. (Patient not taking: Reported on 11/15/2022) 60 tablet 0   ketorolac (TORADOL) 10 MG tablet Take 1 tablet (10 mg total) by mouth 3 (three) times daily. (Patient not taking: Reported on 11/15/2022) 9 tablet 0   levonorgestrel (MIRENA, 52 MG,) 20 MCG/DAY IUD 1 each by Intrauterine route once.     Magnesium Oxide -Mg Supplement 400 MG CAPS Take 400 mg by mouth 2 (two) times daily. 60 capsule 0   niacin (,VITAMIN B3,) 500 MG tablet Take 500 mg by mouth at bedtime.     ondansetron (ZOFRAN) 8 MG  tablet Take 1 tablet (8 mg total) by mouth every 8 (eight) hours as needed for nausea or vomiting. 20 tablet 0   ondansetron (ZOFRAN-ODT) 8 MG disintegrating tablet Take 1 tablet (8 mg total) by mouth every 8 (eight) hours as needed for nausea or vomiting. 20 tablet 1   Ondansetron 4 MG FILM Take 4-8 mg by mouth every 6 (six) hours as needed. (Patient not taking: Reported on 11/15/2022) 15 each 0   oxyCODONE (ROXICODONE) 5 MG immediate release tablet Take 1 tablet (5 mg total) by mouth every 4 (four) hours as needed for severe pain. 30 tablet 0   scopolamine (TRANSDERM-SCOP) 1 MG/3DAYS Place 1 patch (1.5 mg total) onto the skin every 3  (three) days. 3 patch 0   sodium chloride (OCEAN) 0.65 % SOLN nasal spray Place 1 spray into both nostrils as needed for congestion. 104 mL 0   Turmeric (QC TUMERIC COMPLEX PO) Take 500 mg by mouth daily.     zinc sulfate 220 (50 Zn) MG capsule Take 220 mg by mouth daily.     No current facility-administered medications for this visit.    VITAL SIGNS: LMP 10/14/2022 (Exact Date) Comment: Mirena There were no vitals filed for this visit.  Estimated body mass index is 21.79 kg/m as calculated from the following:   Height as of 11/15/22: '5\' 6"'$  (1.676 m).   Weight as of 11/15/22: 135 lb (61.2 kg).  LABS: CBC:    Component Value Date/Time   WBC 17.2 (H) 11/12/2022 0750   WBC 14.6 (H) 11/09/2022 0513   HGB 14.3 11/12/2022 0750   HGB 12.6 11/24/2020 1106   HCT 42.2 11/12/2022 0750   HCT 38.0 11/24/2020 1106   PLT 545 (H) 11/12/2022 0750   PLT 314 11/24/2020 1106   MCV 86.1 11/12/2022 0750   MCV 92 11/24/2020 1106   NEUTROABS 12.9 (H) 11/12/2022 0750   NEUTROABS 3.2 11/24/2020 1106   LYMPHSABS 1.4 11/12/2022 0750   LYMPHSABS 2.7 11/24/2020 1106   MONOABS 1.1 (H) 11/12/2022 0750   EOSABS 1.7 (H) 11/12/2022 0750   EOSABS 0.2 11/24/2020 1106   BASOSABS 0.1 11/12/2022 0750   BASOSABS 0.0 11/24/2020 1106   Comprehensive Metabolic Panel:    Component Value Date/Time   NA 135 11/12/2022 0750   NA 140 11/24/2020 1106   K 3.7 11/12/2022 0750   CL 101 11/12/2022 0750   CO2 27 11/12/2022 0750   BUN 5 (L) 11/12/2022 0750   BUN 7 11/24/2020 1106   CREATININE 0.64 11/12/2022 0750   GLUCOSE 109 (H) 11/12/2022 0750   CALCIUM 8.3 (L) 11/12/2022 0750   AST 14 (L) 11/12/2022 0750   ALT 15 11/12/2022 0750   ALKPHOS 61 11/12/2022 0750   BILITOT 0.4 11/12/2022 0750   PROT 5.5 (L) 11/12/2022 0750   PROT 7.7 11/24/2020 1106   ALBUMIN 2.8 (L) 11/12/2022 0750   ALBUMIN 5.0 (H) 11/24/2020 1106    RADIOGRAPHIC STUDIES: NM PET Image Initial (PI) Skull Base To Thigh (F-18 FDG)  Result Date:  11/15/2022 CLINICAL DATA:  Initial treatment strategy for non-small cell left lung cancer. EXAM: NUCLEAR MEDICINE PET SKULL BASE TO THIGH TECHNIQUE: 7.1 mCi F-18 FDG was injected intravenously. Full-ring PET imaging was performed from the skull base to thigh after the radiotracer. CT data was obtained and used for attenuation correction and anatomic localization. Fasting blood glucose: 152 mg/dl COMPARISON:  11/07/2022 CT chest, abdomen and pelvis. FINDINGS: Mediastinal blood pool activity: SUV max 1.8 Liver activity: SUV max  NA NECK: Hypermetabolic small bilateral supraclavicular lymph nodes, for example 0.6 cm on the right with max SUV 7.4 (series 4/image 46). Incidental CT findings: None. CHEST: Intensely hypermetabolic poorly defined left lower lobe 8.6 x 7.4 cm lung mass probably directly contiguous with the subpulmonic left pleural space with max SUV 16.0 (series 4/image 94). Moderate loculated malignant left pleural effusion with widespread hypermetabolic left pleural thickening/pleural soft tissue deposits with representative max SUV 11.4 at the medial apical left pleural space and representative max SUV 13.3 at the extreme peripheral basilar left pleural space. Intensely hypermetabolic bilateral mediastinal adenopathy involving the bilateral paratracheal, subcarinal, AP window, left prevascular, left pericardiophrenic and bilateral internal mammary nodal chains. Representative 2.1 cm right paratracheal node with max SUV 9.4 (series 4/image 62). Representative 1.6 cm left prevascular node with max SUV 9.8 (series 4/image 65). Representative 1.5 cm left internal mammary node with max SUV 12.4 (series 4/image 64). Hypermetabolic left hilar adenopathy with max SUV 12.0. No enlarged or hypermetabolic right axillary nodes. Mildly hypermetabolic nonenlarged 0.7 cm left axillary node with max SUV 3.1 (series 4/image 68). Three scattered solid right pulmonary nodules, largest 0.9 cm in the medial right middle lobe  with low-level hypermetabolism with max SUV 2.0 (series 7/image 44). Incidental CT findings: Basilar left pleural drain in place. Moderate extrinsic narrowing of the central left upper and left lower lobe airways. ABDOMEN/PELVIS: Hypermetabolic 0.8 cm gastrohepatic ligament node with max SUV 6.4 (series 4/image 112). No abnormal hypermetabolic activity within the liver, pancreas, adrenal glands, or spleen. No hypermetabolic lymph nodes in the pelvis. Incidental CT findings: IUD appears grossly well-positioned in the uterine cavity. SKELETON: No focal hypermetabolic activity to suggest skeletal metastasis. Generalized low level marrow uptake throughout the skeleton compatible with reactive marrow state. Incidental CT findings: None. IMPRESSION: 1. Intensely hypermetabolic poorly defined left lower lobe 8.6 cm lung mass probably directly contiguous with the subpulmonic left pleural space, compatible with primary bronchogenic malignancy. 2. Moderate loculated malignant left pleural effusion with widespread hypermetabolic left pleural thickening/pleural soft tissue deposits. 3. Hypermetabolic bilateral supraclavicular, bilateral mediastinal, left hilar and gastrohepatic ligament nodal metastases. 4. Three scattered solid right pulmonary nodules, largest 0.9 cm in the medial right middle lobe with low-level hypermetabolism, compatible with pulmonary metastases. Electronically Signed   By: Ilona Sorrel M.D.   On: 11/15/2022 16:52   MR BRAIN W WO CONTRAST  Result Date: 11/08/2022 CLINICAL DATA:  Non-small cell lung cancer, staging EXAM: MRI HEAD WITHOUT AND WITH CONTRAST TECHNIQUE: Multiplanar, multiecho pulse sequences of the brain and surrounding structures were obtained without and with intravenous contrast. CONTRAST:  6m GADAVIST GADOBUTROL 1 MMOL/ML IV SOLN COMPARISON:  None Available. FINDINGS: Brain: No abnormal parenchymal or meningeal enhancement. No restricted diffusion to suggest acute or subacute infarct.  No acute hemorrhage, mass, mass effect, or midline shift. No hydrocephalus or extra-axial collection. No hemosiderin deposition to suggest remote hemorrhage. Normal pituitary. Low lying cerebellar tonsils, which extend 3 mm below the foramen magnum and do not meet criteria for Chiari I malformation. Vascular: Patent arterial flow voids. Skull and upper cervical spine: 8 mm enhancing lesions in the right frontal calvarium (series 16, image 129 and 135). Sinuses/Orbits: Clear paranasal sinuses. No acute finding in the orbits. Other: The mastoid air cells are well aerated. IMPRESSION: 1. No evidence of metastatic disease in the brain. 2. 8 mm enhancing lesions in the right frontal calvarium, concerning for osseous metastatic disease in the setting of primary lung cancer. Electronically Signed   By: ABryson Ha  Vasan M.D.   On: 11/08/2022 01:32     PERFORMANCE STATUS (ECOG) : {CHL ONC ECOG [id number]  Review of Systems Unless otherwise noted, a complete review of systems is negative.  Physical Exam General: NAD Cardiovascular: regular rate and rhythm Pulmonary: clear ant fields Abdomen: soft, nontender, + bowel sounds GU: no suprapubic tenderness Extremities: no edema, no joint deformities Skin: no rashes Neurological:  IMPRESSION: *** I introduced myself, Yazmeen Woolf RN, and Palliative's role in collaboration with the oncology team. Concept of Palliative Care was introduced as specialized medical care for people and their families living with serious illness.  It focuses on providing relief from the symptoms and stress of a serious illness.  The goal is to improve quality of life for both the patient and the family. Values and goals of care important to patient and family were attempted to be elicited.    We discussed *** current illness and what it means in the larger context of Her on-going co-morbidities. Natural disease trajectory and expectations were discussed.  I discussed the importance of  continued conversation with family and their medical providers regarding overall plan of care and treatment options, ensuring decisions are within the context of the patients values and GOCs.  PLAN: Established therapeutic relationship. Education provided on palliative's role in collaboration with their Oncology/Radiation team. I will plan to see patient back in 2-4 weeks in collaboration to other oncology appointments.    Patient expressed understanding and was in agreement with this plan. She also understands that She can call the clinic at any time with any questions, concerns, or complaints.   Thank you for your referral and allowing Palliative to assist in Melissa Pineda's care.   Number and complexity of problems addressed: ***HIGH - 1 or more chronic illnesses with SEVERE exacerbation, progression, or side effects of treatment - advanced cancer, pain. Any controlled substances utilized were prescribed in the context of palliative care.  Time Total: ***  Visit consisted of counseling and education dealing with the complex and emotionally intense issues of symptom management and palliative care in the setting of serious and potentially life-threatening illness.Greater than 50%  of this time was spent counseling and coordinating care related to the above assessment and plan.  Signed by: Alda Lea, AGPCNP-BC Palliative Medicine Team/[place]

## 2022-11-19 ENCOUNTER — Telehealth: Payer: Self-pay | Admitting: Medical Oncology

## 2022-11-19 ENCOUNTER — Other Ambulatory Visit: Payer: BC Managed Care – PPO

## 2022-11-19 ENCOUNTER — Encounter: Payer: Self-pay | Admitting: Adult Health

## 2022-11-19 ENCOUNTER — Other Ambulatory Visit: Payer: Self-pay | Admitting: *Deleted

## 2022-11-19 ENCOUNTER — Other Ambulatory Visit: Payer: Self-pay | Admitting: Internal Medicine

## 2022-11-19 ENCOUNTER — Ambulatory Visit (INDEPENDENT_AMBULATORY_CARE_PROVIDER_SITE_OTHER): Payer: BC Managed Care – PPO | Admitting: Adult Health

## 2022-11-19 VITALS — BP 98/64 | HR 96 | Ht 66.0 in | Wt 131.6 lb

## 2022-11-19 DIAGNOSIS — J910 Malignant pleural effusion: Secondary | ICD-10-CM | POA: Diagnosis not present

## 2022-11-19 DIAGNOSIS — C3432 Malignant neoplasm of lower lobe, left bronchus or lung: Secondary | ICD-10-CM

## 2022-11-19 DIAGNOSIS — D72829 Elevated white blood cell count, unspecified: Secondary | ICD-10-CM

## 2022-11-19 DIAGNOSIS — C3492 Malignant neoplasm of unspecified part of left bronchus or lung: Secondary | ICD-10-CM

## 2022-11-19 LAB — GUARDANT 360

## 2022-11-19 NOTE — Assessment & Plan Note (Signed)
Recurrent left malignant pleural effusion in the setting of stage IV adenocarcinoma of the left lung requiring Pleurx catheter which was placed November 09, 2022.  Recent PET scan shows moderate loculated left pleural effusion.  Patient is draining well without any known difficulty.  Continue with daily drainage and catheter care.  Suture removed without known difficulty.  Plan  Patient Instructions  Pleurx care as discussed  Pleurx drainage daily .  Activity as tolerated.  Keep follow up with Oncology as planned  Follow up in 4  week with Dr. Valeta Harms or Nashae Maudlin NP and As needed

## 2022-11-19 NOTE — Progress Notes (Signed)
@Patient  ID: Melissa Pineda, female    DOB: 06-Jan-1986, 37 y.o.   MRN: [id number]  Chief Complaint  Patient presents with   Follow-up    Referring provider: No ref. provider found  HPI: 37 year old female never smoker seen for pulmonary consult during hospitalization January 2024 for newly diagnosed stage IVb non-small cell lung cancer, adenocarcinoma.  Initial presentation with a large left diaphragmatic surface mass in addition to extensive adenopathy and small frontal osseous lesion and effusion.  She required a left Pleurx catheter for recurrent malignant effusion. Followed by medical oncology.  TEST/EVENTS :  November 07, 2022 CT chest abdomen and pelvis showed Bulky mediastinal, left hilar, and left infrahilar adenopathy compatible with malignancy. The left lower lobe bronchus is occluded and the left upper lobe bronchus is narrowed due to surrounding adenopathy. There is a large amount of tumor along the left pleural surface and diaphragm, along with additional pleural deposits of tumor, and a loculated left hydropneumothorax with chest tube in place. 2. There is also a 9 by 6 mm right middle lobe nodule and a few small nodules in the right lower lobe which could be inflammatory or metastatic. 3. Secondary pulmonary lobular interstitial accentuation in the left lung, probably from lymphangitic spread of tumor or edema. 4. Suspected pericardial effusion especially along the cardiac apex, potentially with associated complexity. Malignant pericardial effusion not excluded. 5. Small amount of free pelvic fluid in the cul-de-sac, possibly physiologic but technically nonspecific. 6. Prominent stool throughout the colon favors constipation. 7. Mild edema along the left lateral abdominal wall musculature, nonspecific and possibly related to the left chest tube. 8. The only finding of potential extra thoracic malignant involvement is a mildly enlarged right gastric lymph node (1.0 cm  in diameter).   Hospital 10/2022 :  S/p thoracentesis 1/16 with 1.5L volume removed. Subsequent re-accumulation on CXR 1/17. Pleural fluid with LDH 583, glucose 76, TNC 1,994. Exudative by LDH (ratio 2.1). S/p chest tube placement. Cytology consistent with adenocarcinoma consistent with lung primary.   11/19/2022 Follow up: Metastatic stage IVb non-small cell lung cancer, malignant recurrent pleural effusion and Pleurx catheter Patient returns today for 4-day follow-up.  As above patient was seen for consult during admission earlier this month with large lung mass and left pleural effusion along with adenopathy.  Patient required thoracentesis and chest tube placement.  Cytology came back showing adenocarcinoma consistent with lung primary.  Prior to discharge Pleurx catheter was placed on November 09, 2022.  Patient has been draining daily.  Typically getting around 6 to 800 cc out.  Patient says yesterday she drained 800 cc.  Patient is here today to have Pleurx catheter suture removed.  Patient denies any redness or drainage.  PET scan done on November 15, 2022 showed intensely hypermetabolic left lower lobe lung mass measuring 8.6 cm, moderate loculated malignant left pleural effusion with widespread hypermetabolic left pleural thickening and pleural soft tissue deposits, hypermetabolic bilateral supraclavicular, bilateral mediastinal, left hilar and gastrohepatic ligament nodal metastasis, scattered solid right pulmonary nodules largest measuring 0.9 cm with low-level hypermetabolism.  Patient has been seen at [facility] oncology with consult yesterday.  Has been recommended to begin Repotrectinib .  Insurance is requiring a prior authorization.  Patient did have a positive hCG test that required transvaginal ultrasound completed yesterday that showed IUD in place with no intrauterine gestation.  Patient says she continues to feel very poorly and weak with nausea, cough and pain.  Palliative care is following  extensively with symptom management.  She denies any fever or hemoptysis..   Allergies  Allergen Reactions   Hydrocodone Nausea Only    Dizzy, "crawl out of my skin"   Tramadol Other (See Comments)    "Weird feeling"    Immunization History  Administered Date(s) Administered   Influenza Inj Mdck Quad Pf 07/25/2021   Influenza,inj,Quad PF,6+ Mos 08/19/2019, 11/24/2020   Moderna Sars-Covid-2 Vaccination 01/08/2020, 02/08/2020   Tdap 08/23/2021    Past Medical History:  Diagnosis Date   Anxiety    [employer] 11/21/2020    Tobacco History: Social History   Tobacco Use  Smoking Status Never  Smokeless Tobacco Never   Counseling given: Not Answered   Outpatient Medications Prior to Visit  Medication Sig Dispense Refill   ALPRAZolam (XANAX) 0.5 MG tablet Take 1 tablet (0.5 mg total) by mouth 3 (three) times daily as needed for anxiety. 60 tablet 0   Ascorbic Acid (VITAMIN C) 1000 MG tablet Take 1,000 mg by mouth daily.     b complex vitamins capsule Take 1 capsule by mouth daily.     benzonatate (TESSALON) 200 MG capsule Take 1 capsule (200 mg total) by mouth 3 (three) times daily as needed for cough. 20 capsule 0   cholecalciferol (VITAMIN D3) 25 MCG (1000 UNIT) tablet Take 5,000 Units by mouth daily.     dexamethasone (DECADRON) 6 MG tablet Take 1 tablet (6 mg total) by mouth every morning. Take 1/2 (3mg ) tablet on 1/23. Then take 1 tablet (6mg ) every AM starting on 1/24 or as directed. 10 tablet 0   escitalopram (LEXAPRO) 10 MG tablet Take 1 tablet (10 mg total) by mouth daily. 30 tablet 1   folic acid (FOLVITE) 1 MG tablet Take 1 tablet (1 mg total) by mouth daily. 30 tablet 4   guaiFENesin (MUCINEX) 600 MG 12 hr tablet Take 2 tablets (1,200 mg total) by mouth 2 (two) times daily. 60 tablet 0   levonorgestrel (MIRENA, 52 MG,) 20 MCG/DAY IUD 1 each by Intrauterine route once.     Magnesium Oxide -Mg Supplement 400 MG CAPS Take 400 mg by mouth 2 (two) times daily. 60 capsule 0    metoCLOPramide (REGLAN) 5 MG tablet Take 1 tablet (5 mg total) by mouth every 6 (six) hours as needed for nausea. 30 tablet 1   niacin (,VITAMIN B3,) 500 MG tablet Take 500 mg by mouth at bedtime.     ondansetron (ZOFRAN) 8 MG tablet Take 1 tablet (8 mg total) by mouth every 8 (eight) hours as needed for nausea or vomiting. 20 tablet 0   ondansetron (ZOFRAN-ODT) 8 MG disintegrating tablet Take 1 tablet (8 mg total) by mouth every 8 (eight) hours as needed for nausea or vomiting. 20 tablet 1   Ondansetron 4 MG FILM Take 4-8 mg by mouth every 6 (six) hours as needed. 15 each 0   oxyCODONE (ROXICODONE) 5 MG immediate release tablet Take 1 tablet (5 mg total) by mouth every 4 (four) hours as needed for severe pain. 30 tablet 0   scopolamine (TRANSDERM-SCOP) 1 MG/3DAYS Place 1 patch (1.5 mg total) onto the skin every 3 (three) days. 3 patch 0   Turmeric (QC TUMERIC COMPLEX PO) Take 500 mg by mouth daily.     zinc sulfate 220 (50 Zn) MG capsule Take 220 mg by mouth daily.     celecoxib (CELEBREX) 200 MG capsule Take 1 capsule (200 mg total) by mouth 2 (two) times daily. (Patient not taking: Reported on 11/19/2022) 30 capsule 0  sodium chloride (OCEAN) 0.65 % SOLN nasal spray Place 1 spray into both nostrils as needed for congestion. 104 mL 0   Facility-Administered Medications Prior to Visit  Medication Dose Route Frequency Provider Last Rate Last Admin   famotidine (PEPCID) tablet 20 mg  20 mg Oral Once Acquanetta Chain, DO            Physical Exam  BP 98/64 (BP Location: Left Arm, Patient Position: Sitting, Cuff Size: Normal)   Pulse 96   Ht 5\' 6"  (1.676 m)   Wt 131 lb 9.6 oz (59.7 kg)   LMP 10/14/2022 (Exact Date) Comment: Mirena  SpO2 96% Comment: RA  BMI 21.24 kg/m   Focused exam :  GEN: A/Ox3; pleasant , NAD, well nourished  Left chest pleurx cath in place, no redness, dressing clean and dry . Suture was removed at chest wall site,  unable to remove suture along pleurx cath,  left in place.     Lab Results:  CBC    Component Value Date/Time   WBC 17.2 (H) 11/12/2022 0750   WBC 14.6 (H) 11/09/2022 0513   RBC 4.90 11/12/2022 0750   HGB 14.3 11/12/2022 0750   HGB 12.6 11/24/2020 1106   HCT 42.2 11/12/2022 0750   HCT 38.0 11/24/2020 1106   PLT 545 (H) 11/12/2022 0750   PLT 314 11/24/2020 1106   MCV 86.1 11/12/2022 0750   MCV 92 11/24/2020 1106   MCH 29.2 11/12/2022 0750   MCHC 33.9 11/12/2022 0750   RDW 12.3 11/12/2022 0750   RDW 11.8 11/24/2020 1106   LYMPHSABS 1.4 11/12/2022 0750   LYMPHSABS 2.7 11/24/2020 1106   MONOABS 1.1 (H) 11/12/2022 0750   EOSABS 1.7 (H) 11/12/2022 0750   EOSABS 0.2 11/24/2020 1106   BASOSABS 0.1 11/12/2022 0750   BASOSABS 0.0 11/24/2020 1106    BMET    Component Value Date/Time   NA 135 11/12/2022 0750   NA 140 11/24/2020 1106   K 3.7 11/12/2022 0750   CL 101 11/12/2022 0750   CO2 27 11/12/2022 0750   GLUCOSE 109 (H) 11/12/2022 0750   BUN 5 (L) 11/12/2022 0750   BUN 7 11/24/2020 1106   CREATININE 0.64 11/12/2022 0750   CALCIUM 8.3 (L) 11/12/2022 0750   GFRNONAA >60 11/12/2022 0750   GFRAA 119 11/24/2020 1106    BNP    Component Value Date/Time   BNP 14.3 11/04/2022 1153    ProBNP No results found for: "PROBNP"  Imaging: US OB LESS THAN 14 WEEKS WITH OB TRANSVAGINAL  Result Date: 11/18/2022 CLINICAL DATA:  Positive urine pregnancy test, IUD placement, evaluate for viability, cancer treatment candidate EXAM: OBSTETRIC <14 WK ULTRASOUND TECHNIQUE: Transabdominal ultrasound was performed for evaluation of the gestation as well as the maternal uterus and adnexal regions. COMPARISON:  None Available. FINDINGS: Intrauterine gestational sac: None Yolk sac:  Not Visualized. Embryo:  Not Visualized. Cardiac Activity: Not Visualized. Heart Rate: Not visualized. Subchorionic hemorrhage:  None visualized. Maternal uterus/adnexae: IUD positioned in the fundal endometrial cavity. IMPRESSION: 1. No candidate  intrauterine gestation identified by ultrasound. No ultrasound evidence of adnexal mass or other findings to suggest ectopic pregnancy. Early pregnancy of unknown location. Recommend ectopic precautions and serial beta HCG to resolution. 2.  IUD positioned in the fundal endometrial cavity. Electronically Signed   By: Delanna Ahmadi M.D.   On: 11/18/2022 21:04   NM PET Image Initial (PI) Skull Base To Thigh (F-18 FDG)  Result Date: 11/15/2022 CLINICAL DATA:  Initial treatment  strategy for non-small cell left lung cancer. EXAM: NUCLEAR MEDICINE PET SKULL BASE TO THIGH TECHNIQUE: 7.1 mCi F-18 FDG was injected intravenously. Full-ring PET imaging was performed from the skull base to thigh after the radiotracer. CT data was obtained and used for attenuation correction and anatomic localization. Fasting blood glucose: 152 mg/dl COMPARISON:  11/07/2022 CT chest, abdomen and pelvis. FINDINGS: Mediastinal blood pool activity: SUV max 1.8 Liver activity: SUV max NA NECK: Hypermetabolic small bilateral supraclavicular lymph nodes, for example 0.6 cm on the right with max SUV 7.4 (series 4/image 46). Incidental CT findings: None. CHEST: Intensely hypermetabolic poorly defined left lower lobe 8.6 x 7.4 cm lung mass probably directly contiguous with the subpulmonic left pleural space with max SUV 16.0 (series 4/image 94). Moderate loculated malignant left pleural effusion with widespread hypermetabolic left pleural thickening/pleural soft tissue deposits with representative max SUV 11.4 at the medial apical left pleural space and representative max SUV 13.3 at the extreme peripheral basilar left pleural space. Intensely hypermetabolic bilateral mediastinal adenopathy involving the bilateral paratracheal, subcarinal, AP window, left prevascular, left pericardiophrenic and bilateral internal mammary nodal chains. Representative 2.1 cm right paratracheal node with max SUV 9.4 (series 4/image 62). Representative 1.6 cm left  prevascular node with max SUV 9.8 (series 4/image 65). Representative 1.5 cm left internal mammary node with max SUV 12.4 (series 4/image 64). Hypermetabolic left hilar adenopathy with max SUV 12.0. No enlarged or hypermetabolic right axillary nodes. Mildly hypermetabolic nonenlarged 0.7 cm left axillary node with max SUV 3.1 (series 4/image 68). Three scattered solid right pulmonary nodules, largest 0.9 cm in the medial right middle lobe with low-level hypermetabolism with max SUV 2.0 (series 7/image 44). Incidental CT findings: Basilar left pleural drain in place. Moderate extrinsic narrowing of the central left upper and left lower lobe airways. ABDOMEN/PELVIS: Hypermetabolic 0.8 cm gastrohepatic ligament node with max SUV 6.4 (series 4/image 112). No abnormal hypermetabolic activity within the liver, pancreas, adrenal glands, or spleen. No hypermetabolic lymph nodes in the pelvis. Incidental CT findings: IUD appears grossly well-positioned in the uterine cavity. SKELETON: No focal hypermetabolic activity to suggest skeletal metastasis. Generalized low level marrow uptake throughout the skeleton compatible with reactive marrow state. Incidental CT findings: None. IMPRESSION: 1. Intensely hypermetabolic poorly defined left lower lobe 8.6 cm lung mass probably directly contiguous with the subpulmonic left pleural space, compatible with primary bronchogenic malignancy. 2. Moderate loculated malignant left pleural effusion with widespread hypermetabolic left pleural thickening/pleural soft tissue deposits. 3. Hypermetabolic bilateral supraclavicular, bilateral mediastinal, left hilar and gastrohepatic ligament nodal metastases. 4. Three scattered solid right pulmonary nodules, largest 0.9 cm in the medial right middle lobe with low-level hypermetabolism, compatible with pulmonary metastases. Electronically Signed   By: Ilona Sorrel M.D.   On: 11/15/2022 16:52   DG Chest 2 View  Result Date: 11/15/2022 CLINICAL  DATA:  Malignant pleural effusion. EXAM: CHEST - 2 VIEW COMPARISON:  AP chest 11/09/2022 and CT chest 11/07/2022 FINDINGS: Cardiac silhouette and mediastinal contours are within limits. Moderate left pleural effusion associated left basilar lung opacities, similar to prior 11/09/2022 radiograph and corresponding to the loculated pleural fluid within the left lung posterior, lateral, and major fissure pleura. The right lung appears clear. Likely unchanged tiny left apical pneumothorax, better seen on prior CT. No acute skeletal abnormality. IMPRESSION: Moderate left pleural effusion associated left basilar lung opacities, similar to prior 11/09/2022 radiograph and corresponding to the loculated pleural fluid, lung atelectasis, and possible infrahilar lymphadenopathy/lung tumor better seen on prior CT. Electronically Signed  By: Yvonne Kendall M.D.   On: 11/15/2022 12:00   IR PERC PLEURAL DRAIN W/INDWELL CATH W/IMG GUIDE  Result Date: 11/09/2022 INDICATION: 37 year old with recently diagnosed adenocarcinoma and malignant left pleural effusion. Patient recently had a pigtail chest tube removed and now has re-accumulation of left pleural fluid. Plan for placement of a tunneled left pleural catheter. EXAM: PLACEMENT OF TUNNELED LEFT PLEURAL CATHETER USING ULTRASOUND AND FLUOROSCOPIC GUIDANCE MEDICATIONS: Ancef 2 g ANESTHESIA/SEDATION: Versed 2 mg Patient was monitored by a radiology nurse throughout the procedure. COMPLICATIONS: None immediate. PROCEDURE: Informed written consent was obtained from the patient after a thorough discussion of the procedural risks, benefits and alternatives. All questions were addressed. Maximal Sterile Barrier Technique was utilized including caps, mask, sterile gowns, sterile gloves, sterile drape, hand hygiene and skin antiseptic. A timeout was performed prior to the initiation of the procedure. Patient was placed on her right side. Ultrasound was used to evaluate the left pleural  fluid. The left mid axillary region was prepped and draped in sterile fashion. Two areas in the left lateral lower chest were anesthetized using 1% lidocaine with epinephrine. Two small incisions were made. The PleurX catheter was tunneled between these incisions and the cuff was placed underneath the skin. Using ultrasound guidance, pleural catheter was directed into the pleural space through the more lateral incision. Pleural catheter was confirmed within the pleural fluid using ultrasound and amber colored fluid was aspirated. Wire was advanced into the pleural space and the tract was dilated to accommodate the peel-away sheath. Pleural catheter was advanced through the peel-away sheath and into the pleural space. Approximately 600 mL of amber colored fluid was removed from the left pleural space. The pleural entrance dermatotomy site was closed using absorbable suture and Dermabond. Catheter was secured to the skin with suture. Dressings were placed. Fluoroscopic and ultrasound images were taken and saved for documentation. FINDINGS: Large pocket of pleural fluid in the left lower chest. However, there is very complex loculated left pleural fluid as well. The largest pocket in left lower chest was targeted for the PleurX catheter placement. Approximately 600 mL of fluid was removed. IMPRESSION: Successful placement of a tunneled left pleural catheter using ultrasound and fluoroscopic guidance. Electronically Signed   By: Markus Daft M.D.   On: 11/09/2022 17:37   DG Chest Port 1 View  Result Date: 11/09/2022 CLINICAL DATA:  Non-small-cell lung cancer.  Drain placement. EXAM: PORTABLE CHEST 1 VIEW COMPARISON:  11/09/2022, earlier same day FINDINGS: Diffuse interstitial opacity in the left lung is similar to prior with persistent left base collapse/consolidation and moderate left pleural effusion. Hazy opacity over the left apex may represent fluid in the fissure or loculated posterior pleural effusions seen on  previous CT imaging. Left pleural drain is new in the interval. Right lung remains clear. Cardiopericardial silhouette is at upper limits of normal for size. The visualized bony structures of the thorax are unremarkable. IMPRESSION: Interval placement of left pleural drain with persistent left base collapse/consolidation and moderate left pleural effusion. Electronically Signed   By: Misty Stanley M.D.   On: 11/09/2022 12:55   [place] CHEST (PLEURAL EFFUSION)  Result Date: 11/09/2022 CLINICAL DATA:  Evaluate pleural effusion. EXAM: CHEST ULTRASOUND COMPARISON:  Chest radiograph 11/09/2022 FINDINGS: There is a moderate amount of complicated fluid demonstrated within the left chest compatible with pleural effusion. No significant right pleural effusion identified. IMPRESSION: Moderate left pleural effusion. Electronically Signed   By: Lovey Newcomer M.D.   On: 11/09/2022 09:51  DG CHEST PORT 1 VIEW  Result Date: 11/09/2022 CLINICAL DATA:  Pleural effusion. EXAM: PORTABLE CHEST 1 VIEW COMPARISON:  11/08/2022 FINDINGS: Patient is slightly rotated to the right. Interval removal of left basilar pigtail pleural drainage catheter. Stable to slight interval worsening of large left pleural effusion with loculation over the left upper lobe/apex. No definite left apical pneumothorax. Right lung is clear. Cardiomediastinal silhouette and remainder of the exam is unchanged. IMPRESSION: Stable to slight interval worsening of large left pleural effusion with loculation over the left upper lobe/apex. Interval removal of left basilar pigtail pleural drainage catheter. No definite left pneumothorax. Electronically Signed   By: Marin Olp M.D.   On: 11/09/2022 08:11   DG CHEST PORT 1 VIEW  Result Date: 11/08/2022 CLINICAL DATA:  Pleural effusion. EXAM: PORTABLE CHEST 1 VIEW COMPARISON:  AP chest 11/07/2022 at 4:54 a.m. FINDINGS: Unchanged position of left basilar pigtail chest tube. Mild-to-moderate left pleural effusion with  associated left midlung heterogeneous opacities, unchanged. The right lung is clear. Note is made of small left apical pneumothorax, unchanged from 11/07/2022 radiograph and better seen on most recent study of 11/07/2022 chest CT. The visualized cardiac silhouette and mediastinal contours are within normal limits. No acute skeletal abnormality. IMPRESSION: 1. Unchanged position of left basilar pigtail chest tube. 2. Left basilar lower lung opacities, representing a combination of pleural effusion, atelectasis, and left perihilar and lower lobe lung tumor better seen on yesterday's chest CT. 3. Unchanged small left apical pneumothorax. Electronically Signed   By: Yvonne Kendall M.D.   On: 11/08/2022 08:23   MR BRAIN W WO CONTRAST  Result Date: 11/08/2022 CLINICAL DATA:  Non-small cell lung cancer, staging EXAM: MRI HEAD WITHOUT AND WITH CONTRAST TECHNIQUE: Multiplanar, multiecho pulse sequences of the brain and surrounding structures were obtained without and with intravenous contrast. CONTRAST:  19mL GADAVIST GADOBUTROL 1 MMOL/ML IV SOLN COMPARISON:  None Available. FINDINGS: Brain: No abnormal parenchymal or meningeal enhancement. No restricted diffusion to suggest acute or subacute infarct. No acute hemorrhage, mass, mass effect, or midline shift. No hydrocephalus or extra-axial collection. No hemosiderin deposition to suggest remote hemorrhage. Normal pituitary. Low lying cerebellar tonsils, which extend 3 mm below the foramen magnum and do not meet criteria for Chiari I malformation. Vascular: Patent arterial flow voids. Skull and upper cervical spine: 8 mm enhancing lesions in the right frontal calvarium (series 16, image 129 and 135). Sinuses/Orbits: Clear paranasal sinuses. No acute finding in the orbits. Other: The mastoid air cells are well aerated. IMPRESSION: 1. No evidence of metastatic disease in the brain. 2. 8 mm enhancing lesions in the right frontal calvarium, concerning for osseous metastatic  disease in the setting of primary lung cancer. Electronically Signed   By: Merilyn Baba M.D.   On: 11/08/2022 01:32   CT CHEST ABDOMEN PELVIS W CONTRAST  Result Date: 11/07/2022 CLINICAL DATA:  Non-small cell lung cancer staging * Tracking Code: BO * EXAM: CT CHEST, ABDOMEN, AND PELVIS WITH CONTRAST TECHNIQUE: Multidetector CT imaging of the chest, abdomen and pelvis was performed following the standard protocol during bolus administration of intravenous contrast. RADIATION DOSE REDUCTION: This exam was performed according to the departmental dose-optimization program which includes automated exposure control, adjustment of the mA and/or kV according to patient size and/or use of iterative reconstruction technique. CONTRAST:  13mL OMNIPAQUE IOHEXOL 300 MG/ML  SOLN COMPARISON:  11/04/2022 CT chest, and CT abdomen from 10/09/2011 FINDINGS: CT CHEST FINDINGS Cardiovascular: Suspected pericardial effusion especially along the cardiac apex, potentially with  associated complexity as on image 44 series 2. Mediastinum/Nodes: Bulky but indistinctly marginated right and left paratracheal, AP window, prevascular, left internal mammary, subcarinal, left hilar, and left infrahilar adenopathy. Index right paratracheal node 1.9 cm in short axis, image 22 series 2. Index left internal mammary node 1.4 cm in short axis, image 24 series 2. The hilar and infrahilar adenopathy is somewhat conglomerate and difficult to individually measure. AP window lymph node 1.4 cm in short axis, image 24 series 2. Lungs/Pleura: Substantially reduced size of the left pleural effusion with a left-sided chest tube in place. Scattered loculations of gas in the left pleural space along with loculations of fluid along the major fissure and posteriorly. The left lower lobe bronchus is essentially occluded by central adenopathy in tumor, with the left upper lobe bronchus narrowed due to surrounding adenopathy. Complex appearance at the left lung base  likely due to a combination of tumor in atelectatic lung for example on image 69 series 4. Suspected extensive tumor versus adherent atelectatic lung along the left diaphragm and pleural surface. Irregular deposition of tumor posteriorly along the pleural and diaphragmatic margin for example on image 95 series 4. It is difficult to confidently measure tumor separate from atelectatic lung but suspected tumor along the diaphragmatic surface measures about 9.9 by 6.4 cm on image 47 series 2. Secondary pulmonary lobular interstitial accentuation throughout the left lung. Small left apical hydropneumothorax with air-fluid level. Scattered ground-glass opacity in the aerated portions of the left lower lobe. Right apical pleuroparenchymal scarring. Right middle lobe nodule 0.9 by 0.6 cm, image 103 series 6. In the right lower lobe there are a few nodules measuring up to about 5 mm in diameter which could be inflammatory or metastatic. Musculoskeletal: Unremarkable CT ABDOMEN PELVIS FINDINGS Hepatobiliary: Unremarkable Pancreas: Unremarkable Spleen: Unremarkable Adrenals/Urinary Tract: No adrenal mass. The kidneys appear unremarkable. There is contrast medium in the collecting systems which could obscure nonobstructive calculi, but there is no hydronephrosis or hydroureter. Stomach/Bowel: Prominent stool throughout the colon favors constipation. Vascular/Lymphatic: A right gastric lymph node measures 10 mm in short axis on image 56 series 2. Reproductive: An IUD is satisfactorily positioned along the endometrium. Adnexa unremarkable. Other: There is a small amount of right eccentric free pelvic fluid in the cul-de-sac on image 103 series 2, possibly physiologic but technically nonspecific. No well-defined nodularity along the omentum or peritoneal surfaces. Musculoskeletal: Mild edema tracks along the left lateral abdominal wall musculature, nonspecific and possibly related to the left chest tube. No specific indicators of  osseous metastatic disease. IMPRESSION: 1. Bulky mediastinal, left hilar, and left infrahilar adenopathy compatible with malignancy. The left lower lobe bronchus is occluded and the left upper lobe bronchus is narrowed due to surrounding adenopathy. There is a large amount of tumor along the left pleural surface and diaphragm, along with additional pleural deposits of tumor, and a loculated left hydropneumothorax with chest tube in place. 2. There is also a 9 by 6 mm right middle lobe nodule and a few small nodules in the right lower lobe which could be inflammatory or metastatic. 3. Secondary pulmonary lobular interstitial accentuation in the left lung, probably from lymphangitic spread of tumor or edema. 4. Suspected pericardial effusion especially along the cardiac apex, potentially with associated complexity. Malignant pericardial effusion not excluded. 5. Small amount of free pelvic fluid in the cul-de-sac, possibly physiologic but technically nonspecific. 6. Prominent stool throughout the colon favors constipation. 7. Mild edema along the left lateral abdominal wall musculature, nonspecific and possibly  related to the left chest tube. 8. The only finding of potential extra thoracic malignant involvement is a mildly enlarged right gastric lymph node (1.0 cm in diameter). Electronically Signed   By: Van Clines M.D.   On: 11/07/2022 21:54   DG CHEST PORT 1 VIEW  Result Date: 11/07/2022 CLINICAL DATA:  Pleural effusion on left. EXAM: PORTABLE CHEST 1 VIEW COMPARISON:  AP chest 11/06/2022, CT chest 11/04/2022 FINDINGS: Redemonstration of left basilar pigtail chest tube. Mild interval improvement in now mild-to-moderate left pleural effusion, previously extending 60% up the left hemithorax and now extending 30-40% up the left hemithorax. Associated left midlung linear densities. Minimal right costophrenic angle linear subsegmental atelectasis. No pneumothorax. Visualized cardiac silhouette and  mediastinal contours are within normal limits. No acute skeletal abnormality. IMPRESSION: Redemonstration of left basilar pigtail chest tube. Mild interval improvement in now mild-to-moderate left pleural effusion Electronically Signed   By: Yvonne Kendall M.D.   On: 11/07/2022 08:29   DG CHEST PORT 1 VIEW  Result Date: 11/06/2022 CLINICAL DATA:  Chest tube placement.  [id number] EXAM: PORTABLE CHEST 1 VIEW COMPARISON:  11/06/2022 at 4:18 a.m. FINDINGS: There is a large left pleural effusion with basilar atelectasis or consolidation. Similar appearance to prior study. A left chest tube has been placed in the lower chest region. The right lung is clear. Heart size is obscured by the left pleural/parenchymal process. No pneumothorax. IMPRESSION: Large left pleural effusion with infiltration or atelectasis in the left base. Interval placement of a left chest tube. No pneumothorax. Electronically Signed   By: Lucienne Capers M.D.   On: 11/06/2022 17:49   DG CHEST PORT 1 VIEW  Result Date: 11/06/2022 CLINICAL DATA:  Follow-up pleural effusion. EXAM: PORTABLE CHEST 1 VIEW COMPARISON:  11/05/22 FINDINGS: Stable cardiomediastinal contours. There is a large left pleural effusion which appears unchanged in volume from the previous exam. Right lung appears clear. No new findings. IMPRESSION: No change in large left pleural effusion. Electronically Signed   By: Kerby Moors M.D.   On: 11/06/2022 08:15   DG CHEST PORT 1 VIEW  Result Date: 11/05/2022 CLINICAL DATA:  Post thoracentesis. EXAM: PORTABLE CHEST 1 VIEW COMPARISON:  Chest x-ray from earlier same day. FINDINGS: Decreased size of the LEFT pleural effusion status post thoracentesis. Large opacity persists at the LEFT lung base, likely residual pleural effusion and/or atelectasis/airspace collapse. RIGHT lung is clear. No pneumothorax is seen. Heart size and mediastinal contours are grossly stable. IMPRESSION: 1. Decreased size of the LEFT pleural effusion status  post thoracentesis. No pneumothorax. 2. Large opacity persists at the LEFT lung base, likely residual pleural effusion and/or atelectasis/airspace collapse. Electronically Signed   By: Franki Cabot M.D.   On: 11/05/2022 13:24   DG CHEST PORT 1 VIEW  Result Date: 11/05/2022 CLINICAL DATA:  Short of breath EXAM: PORTABLE CHEST 1 VIEW COMPARISON:  11/04/2022 FINDINGS: LEFT cardiac silhouette is obscured large LEFT effusion. LEFT pleural effusion occupies 75% of LEFT hemithorax and is increased comparison exam. RIGHT lung clear. No pneumothorax. IMPRESSION: Increasing large LEFT effusion occupying 75% of the LEFT hemithorax. Electronically Signed   By: Suzy Bouchard M.D.   On: 11/05/2022 10:14   CT Angio Chest PE W and/or Wo Contrast  Result Date: 11/04/2022 CLINICAL DATA:  Pneumonia and worsening shortness of breath EXAM: CT ANGIOGRAPHY CHEST WITH CONTRAST TECHNIQUE: Multidetector CT imaging of the chest was performed using the standard protocol during bolus administration of intravenous contrast. Multiplanar CT image reconstructions and MIPs were obtained  to evaluate the vascular anatomy. RADIATION DOSE REDUCTION: This exam was performed according to the departmental dose-optimization program which includes automated exposure control, adjustment of the mA and/or kV according to patient size and/or use of iterative reconstruction technique. CONTRAST:  64mL OMNIPAQUE IOHEXOL 350 MG/ML SOLN COMPARISON:  Chest radiograph dated 10/30/2022 FINDINGS: Cardiovascular: The study is high quality for the evaluation of pulmonary embolism. There are no filling defects in the central, lobar, segmental or subsegmental pulmonary artery branches to suggest acute pulmonary embolism. Great vessels are normal in course and caliber. Normal heart size. No significant pericardial fluid/thickening. Mediastinum/Nodes: Imaged thyroid gland without nodules meeting criteria for imaging follow-up by size. Rightward shift of the  mediastinum. Normal esophagus. Multi station lymphadenopathy including 1.3 cm left internal mammary lymph node(5:98), 2.0 cm subcarinal lymph node (4:59), and 1.4 cm prevascular lymph node (4:51). Lungs/Pleura: The central airways are patent. Near-complete atelectasis of the lingula and left lower lobe with heterogeneous consolidation resulting in marked narrowing of the left sided airways. Dependent ground-glass opacity in the left upper lobe. No pneumothorax. Large left pleural effusion. Upper abdomen: Normal. Musculoskeletal: No acute or abnormal lytic or blastic osseous lesions. Review of the MIP images confirms the above findings. IMPRESSION: 1. No acute pulmonary embolism. 2. Near-complete atelectasis of the lingula and left lower lobe with heterogeneous consolidation resulting in marked narrowing of the left sided airways, likely multifocal pneumonia. 3. Large left pleural effusion. 4. Multi station mediastinal lymphadenopathy likely reactive Electronically Signed   By: Darrin Nipper M.D.   On: 11/04/2022 14:51   DG Chest Port 1 View  Result Date: 11/04/2022 CLINICAL DATA:  Provided history: Left chest pain. EXAM: PORTABLE CHEST 1 VIEW COMPARISON:  Prior chest radiograph 10/30/2022. FINDINGS: Significant interval increase in size of a left pleural effusion, now moderate-to-large. Underlying atelectasis and/or airspace consolidation within the mid and lower left lung, also significantly progressed. No appreciable airspace consolidation on the right. Portions of the cardiac silhouette are obscured on the left, limiting evaluation of heart size. No evidence of pneumothorax. No acute bony abnormality identified. IMPRESSION: Significant interval increase in size of a left pleural effusion, now moderate-to-large. Underlying atelectasis and/or airspace consolidation within the mid and lower left lung has also significantly progressed from the prior examination of 10/30/2022. Electronically Signed   By: Kellie Simmering  D.O.   On: 11/04/2022 12:22   DG Chest Portable 1 View  Result Date: 10/30/2022 CLINICAL DATA:  Cough, headache, fatigue, night sweats. EXAM: PORTABLE CHEST 1 VIEW COMPARISON:  Overlapping portions CT abdomen 10/09/2011 FINDINGS: Left lower lobe and possible lingular consolidation. Pleural thickening along the left lateral costophrenic angle may reflect adjacent pleural effusion. The right lung appears clear. Cardiac and mediastinal margins appear normal. IMPRESSION: 1. Left lower lobe and possible lingular consolidation with small left pleural effusion. Findings favor left basilar pneumonia over atelectasis. Followup PA and lateral chest X-ray is recommended in 3-4 weeks following trial of antibiotic therapy to ensure resolution and exclude the unlikely possibility of underlying malignancy. Electronically Signed   By: Van Clines M.D.   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  Recent PET scan shows moderate loculated left pleural effusion.  Patient is draining well without any known difficulty.  Continue with daily drainage and catheter care.  Suture removed without known difficulty.  Plan  Patient Instructions  Pleurx care as discussed  Pleurx drainage daily .  Activity as tolerated.  Keep follow up with Oncology as planned  Follow up in 4  week with Dr. Valeta Harms or Lindsay Soulliere NP and As needed        Rexene Edison, NP 11/19/2022

## 2022-11-19 NOTE — Patient Instructions (Signed)
Pleurx care as discussed  Pleurx drainage daily .  Activity as tolerated.  Keep follow up with Oncology as planned  Follow up in 4  week with Dr. Valeta Harms or Ryatt Corsino NP and As needed

## 2022-11-19 NOTE — Telephone Encounter (Signed)
Lab and infusion appt cancelled for tomorrow.

## 2022-11-19 NOTE — Assessment & Plan Note (Signed)
Late add: Labs reviewed in care everywhere showed white count was elevated at 50,000 questionable etiology.  Patient without fever or other infectious symptoms. Patient has appointment with oncology tomorrow with repeat labs with CBC. Called patient at home advised to keep appointment and labs-if remains elevated will need further evaluation

## 2022-11-20 ENCOUNTER — Other Ambulatory Visit: Payer: Self-pay

## 2022-11-20 ENCOUNTER — Inpatient Hospital Stay: Payer: BC Managed Care – PPO

## 2022-11-20 ENCOUNTER — Inpatient Hospital Stay (HOSPITAL_BASED_OUTPATIENT_CLINIC_OR_DEPARTMENT_OTHER): Payer: BC Managed Care – PPO | Admitting: Internal Medicine

## 2022-11-20 ENCOUNTER — Encounter: Payer: Self-pay | Admitting: Internal Medicine

## 2022-11-20 ENCOUNTER — Telehealth: Payer: Self-pay | Admitting: Pharmacist

## 2022-11-20 ENCOUNTER — Other Ambulatory Visit (HOSPITAL_COMMUNITY): Payer: Self-pay

## 2022-11-20 ENCOUNTER — Other Ambulatory Visit: Payer: Self-pay | Admitting: Medical Oncology

## 2022-11-20 VITALS — BP 111/77 | HR 98 | Temp 97.8°F | Resp 15 | Ht 66.0 in | Wt 130.2 lb

## 2022-11-20 DIAGNOSIS — J029 Acute pharyngitis, unspecified: Secondary | ICD-10-CM | POA: Diagnosis not present

## 2022-11-20 DIAGNOSIS — C3492 Malignant neoplasm of unspecified part of left bronchus or lung: Secondary | ICD-10-CM

## 2022-11-20 DIAGNOSIS — C3490 Malignant neoplasm of unspecified part of unspecified bronchus or lung: Secondary | ICD-10-CM | POA: Diagnosis not present

## 2022-11-20 DIAGNOSIS — D72829 Elevated white blood cell count, unspecified: Secondary | ICD-10-CM | POA: Diagnosis not present

## 2022-11-20 DIAGNOSIS — R110 Nausea: Secondary | ICD-10-CM | POA: Diagnosis not present

## 2022-11-20 DIAGNOSIS — R059 Cough, unspecified: Secondary | ICD-10-CM | POA: Diagnosis not present

## 2022-11-20 DIAGNOSIS — J910 Malignant pleural effusion: Secondary | ICD-10-CM | POA: Diagnosis not present

## 2022-11-20 DIAGNOSIS — Z79899 Other long term (current) drug therapy: Secondary | ICD-10-CM | POA: Diagnosis not present

## 2022-11-20 DIAGNOSIS — Z793 Long term (current) use of hormonal contraceptives: Secondary | ICD-10-CM | POA: Diagnosis not present

## 2022-11-20 DIAGNOSIS — J329 Chronic sinusitis, unspecified: Secondary | ICD-10-CM | POA: Diagnosis not present

## 2022-11-20 LAB — CBC WITH DIFFERENTIAL (CANCER CENTER ONLY)
Abs Immature Granulocytes: 1.87 10*3/uL — ABNORMAL HIGH (ref 0.00–0.07)
Basophils Absolute: 0 10*3/uL (ref 0.0–0.1)
Basophils Relative: 0 %
Eosinophils Absolute: 0.3 10*3/uL (ref 0.0–0.5)
Eosinophils Relative: 0 %
HCT: 44.7 % (ref 36.0–46.0)
Hemoglobin: 15.4 g/dL — ABNORMAL HIGH (ref 12.0–15.0)
Immature Granulocytes: 3 %
Lymphocytes Relative: 4 %
Lymphs Abs: 2.5 10*3/uL (ref 0.7–4.0)
MCH: 29.3 pg (ref 26.0–34.0)
MCHC: 34.5 g/dL (ref 30.0–36.0)
MCV: 85.1 fL (ref 80.0–100.0)
Monocytes Absolute: 2.9 10*3/uL — ABNORMAL HIGH (ref 0.1–1.0)
Monocytes Relative: 4 %
Neutro Abs: 60.2 10*3/uL — ABNORMAL HIGH (ref 1.7–7.7)
Neutrophils Relative %: 89 %
Platelet Count: 142 10*3/uL — ABNORMAL LOW (ref 150–400)
RBC: 5.25 MIL/uL — ABNORMAL HIGH (ref 3.87–5.11)
RDW: 13 % (ref 11.5–15.5)
Smear Review: NORMAL
WBC Count: 67.8 10*3/uL (ref 4.0–10.5)
nRBC: 0 % (ref 0.0–0.2)

## 2022-11-20 LAB — CMP (CANCER CENTER ONLY)
ALT: 46 U/L — ABNORMAL HIGH (ref 0–44)
AST: 14 U/L — ABNORMAL LOW (ref 15–41)
Albumin: 2.9 g/dL — ABNORMAL LOW (ref 3.5–5.0)
Alkaline Phosphatase: 72 U/L (ref 38–126)
Anion gap: 8 (ref 5–15)
BUN: 15 mg/dL (ref 6–20)
CO2: 28 mmol/L (ref 22–32)
Calcium: 8.4 mg/dL — ABNORMAL LOW (ref 8.9–10.3)
Chloride: 94 mmol/L — ABNORMAL LOW (ref 98–111)
Creatinine: 0.9 mg/dL (ref 0.44–1.00)
GFR, Estimated: >60 mL/min (ref >60)
Glucose, Bld: 145 mg/dL — ABNORMAL HIGH (ref 70–99)
Potassium: 5.3 mmol/L — ABNORMAL HIGH (ref 3.5–5.1)
Sodium: 130 mmol/L — ABNORMAL LOW (ref 135–145)
Total Bilirubin: 0.6 mg/dL (ref 0.3–1.2)
Total Protein: 5.7 g/dL — ABNORMAL LOW (ref 6.5–8.1)

## 2022-11-20 LAB — PREGNANCY, URINE: Preg Test, Ur: NEGATIVE

## 2022-11-20 LAB — HCG, SERUM, QUALITATIVE: Preg, Serum: NEGATIVE

## 2022-11-20 LAB — URIC ACID: Uric Acid, Serum: 4.3 mg/dL (ref 2.5–7.1)

## 2022-11-20 MED ORDER — REPOTRECTINIB 40 MG PO CAPS
160.0000 mg | ORAL_CAPSULE | Freq: Every day | ORAL | 0 refills | Status: DC
  Filled 2022-11-20: qty 56, fill #0
  Filled 2022-11-22: qty 56, 56d supply, fill #0

## 2022-11-20 MED ORDER — BENZONATATE 200 MG PO CAPS: 200.0000 mg | ORAL_CAPSULE | Freq: Four times a day (QID) | ORAL | 2 refills | Status: DC | PRN

## 2022-11-20 MED ORDER — OXYCODONE HCL 5 MG PO TABS: 5.0000 mg | ORAL_TABLET | ORAL | 0 refills | Status: DC | PRN

## 2022-11-20 MED ORDER — REPOTRECTINIB 40 MG PO CAPS
160.0000 | ORAL_CAPSULE | Freq: Every day | ORAL | 0 refills | Status: DC
  Filled 2022-11-20: qty 60, fill #0

## 2022-11-20 NOTE — Progress Notes (Signed)
DISCONTINUE ON PATHWAY REGIMEN - Non-Small Cell Lung     Cycles 1 through up to 6: A cycle is every 21 days:     Bevacizumab-xxxx      Pemetrexed      Carboplatin   **Always confirm dose/schedule in your pharmacy ordering system**  REASON: Other Reason PRIOR TREATMENT: [id number]: Carboplatin AUC=5 + Pemetrexed 500 mg/m2 + Bevacizumab 15 mg/kg q21 Days x 1 Cycle TREATMENT RESPONSE: Unable to Evaluate  START OFF PATHWAY REGIMEN - Non-Small Cell Lung   [id number]:Repotrectinib PO D1-28 q28 Days:   Cycle 1: A cycle is 28 days:     Repotrectinib      Repotrectinib    Cycles 2 and beyond: A cycle is every 28 days:     Repotrectinib   **Always confirm dose/schedule in your pharmacy ordering system**  Patient Characteristics: Stage IV Metastatic, Nonsquamous, Molecular Analysis Completed, Molecular Alteration Present and Eligible for Molecular Targeted Therapy, Initial Molecular Targeted Therapy, ROS1 Fusion/Rearrangement Positive Therapeutic Status: Stage IV Metastatic Histology: Nonsquamous Cell Broad Molecular Profiling Status: Molecular Analysis Completed Molecular Analysis Results: Alteration Present and Eligible for Molecular Targeted Therapy Molecular Alteration Present: ROS1 Fusion/Rearrangement Positive Molecular Targeted Line of Therapy: Initial Molecular Targeted Therapy Intent of Therapy: Non-Curative / Palliative Intent, Discussed with Patient

## 2022-11-20 NOTE — Telephone Encounter (Signed)
Oral Chemotherapy Pharmacist Encounter   Notified by Dr. Julien Nordmann about new prescription for Augtryo (repotrectinib) for patient with stage IV non-small cell lung cancer, EZR-ROS1 fusion positive, planned duration until disease progression or unacceptable drug toxicity.  Met with patient in clinic and she stated that she has been in contact with [place] team for getting access to medication. Spoke with Mendel Ryder, CPhT with [facility] and she stated that patient's insurance had denied Augtryo as this is not on patient's formulary, so they are working on the appeal process and have also started the manufacturer assistance process. She stated she submitted the application to [employer] on 3/55/21. Patient has also been applied for a 30-day free trial through [employer] while awaiting finalized manufacturer assistance process.   Oral chemotherapy clinic will continue to to follow.  Leron Croak, PharmD, BCPS, [facility] Hematology/Oncology Clinical Pharmacist [facility] and [place] [phone] 11/20/2022 11:38 AM

## 2022-11-20 NOTE — Progress Notes (Signed)
[place] [phone]   [phone]  OFFICE PROGRESS NOTE  Patient, No Pcp Per No address on file  DIAGNOSIS: Stage IVB (T4, N3, M1b) non-small cell lung cancer, adenocarcinoma presented with large left diaphragmatic surface mass in addition to left hilar, infrahilar, AP window, right and left paratracheal, subcarinal as well as left internal mammary lymphadenopathy in addition to right gastric lymph node and solitary Small right frontal calvarial osseous metastatic disease with large malignant left pleural effusion diagnosed in January 2024.    Molecular studies by [employer] as well as [employer] blood test showed:   EZR-ROS1 fusion approved by FDA Crizotinib, Entrectinib, Repotrectinib   approved in other indication Ceritinib, Lorlatinib Yes      3.6%   PD-L1 expression by foundation 1 that was 98%.   PRIOR THERAPY: Status post left Pleurx catheter placement for drainage of recurrent left pleural effusion  CURRENT THERAPY: Repotrectinib (Augtyro) 160 mg p.o. daily for the first 2 weeks and then 160 mg p.o. twice daily after that if tolerated.  She is expected to start the first dose of this treatment in few days once the drugs become available.  INTERVAL HISTORY: Melissa Pineda [age] y.o. female returns to the clinic today for follow-up visit accompanied by her mother.  The patient continues to complain of the persistent cough as well as pain on the left side of the chest.  She is currently manage by the palliative care team for her pain issues.  She is on oxycodone and gabapentin.  She also has been on high-dose Decadron 6 mg every day and her total white blood count is significantly elevated today likely secondary to her steroid treatment.  For the cough she is on treatment with benzoate.  The patient was seen by Dr. Carlis Abbott at [facility] for a second opinion he is in agreement with the treatment with repotrectinib.   Unfortunately there is some insurance issues with the coverage but they started the process at [place] with the manufacture for a 30-day supply until her insurance issues resolved.  She will give [place] a scary moment when her urine pregnancy test at [facility] was positive.  She had a previous serum pregnancy test 2 weeks before at [facility] that was negative.  The patient had vaginal ultrasound at [facility] that showed absence of any pregnancy.  She is here today for reevaluation and discussion of her treatment options.  She was tentatively on schedule for 1 cycle of systemic chemotherapy pending the molecular study but this chemotherapy is now canceled since we had the molecular study and she had positive ROS 1 fusion.  MEDICAL HISTORY: Past Medical History:  Diagnosis Date   Anxiety    [employer] 11/21/2020    ALLERGIES:  is allergic to hydrocodone and tramadol.  MEDICATIONS:  Current Outpatient Medications  Medication Sig Dispense Refill   ALPRAZolam (XANAX) 0.5 MG tablet Take 1 tablet (0.5 mg total) by mouth 3 (three) times daily as needed for anxiety. 60 tablet 0   Ascorbic Acid (VITAMIN C) 1000 MG tablet Take 1,000 mg by mouth daily.     b complex vitamins capsule Take 1 capsule by mouth daily.     benzonatate (TESSALON) 200 MG capsule Take 1 capsule (200 mg total) by mouth 3 (three) times daily as needed for cough. 20 capsule 0   celecoxib (CELEBREX) 200 MG capsule Take 1 capsule (200 mg total) by mouth 2 (two) times daily. (Patient not taking:  Reported on 11/19/2022) 30 capsule 0   cholecalciferol (VITAMIN D3) 25 MCG (1000 UNIT) tablet Take 5,000 Units by mouth daily.     dexamethasone (DECADRON) 6 MG tablet Take 1 tablet (6 mg total) by mouth every morning. Take 1/2 (3mg ) tablet on 1/23. Then take 1 tablet (6mg ) every AM starting on 1/24 or as directed. 10 tablet 0   escitalopram (LEXAPRO) 10 MG tablet Take 1 tablet (10 mg total) by mouth daily. 30 tablet 1   folic acid (FOLVITE) 1 MG tablet Take 1  tablet (1 mg total) by mouth daily. 30 tablet 4   guaiFENesin (MUCINEX) 600 MG 12 hr tablet Take 2 tablets (1,200 mg total) by mouth 2 (two) times daily. 60 tablet 0   levonorgestrel (MIRENA, 52 MG,) 20 MCG/DAY IUD 1 each by Intrauterine route once.     Magnesium Oxide -Mg Supplement 400 MG CAPS Take 400 mg by mouth 2 (two) times daily. 60 capsule 0   metoCLOPramide (REGLAN) 5 MG tablet Take 1 tablet (5 mg total) by mouth every 6 (six) hours as needed for nausea. 30 tablet 1   niacin (,VITAMIN B3,) 500 MG tablet Take 500 mg by mouth at bedtime.     ondansetron (ZOFRAN) 8 MG tablet Take 1 tablet (8 mg total) by mouth every 8 (eight) hours as needed for nausea or vomiting. 20 tablet 0   ondansetron (ZOFRAN-ODT) 8 MG disintegrating tablet Take 1 tablet (8 mg total) by mouth every 8 (eight) hours as needed for nausea or vomiting. 20 tablet 1   Ondansetron 4 MG FILM Take 4-8 mg by mouth every 6 (six) hours as needed. 15 each 0   oxyCODONE (ROXICODONE) 5 MG immediate release tablet Take 1 tablet (5 mg total) by mouth every 4 (four) hours as needed for severe pain. 30 tablet 0   scopolamine (TRANSDERM-SCOP) 1 MG/3DAYS Place 1 patch (1.5 mg total) onto the skin every 3 (three) days. 3 patch 0   Turmeric (QC TUMERIC COMPLEX PO) Take 500 mg by mouth daily.     zinc sulfate 220 (50 Zn) MG capsule Take 220 mg by mouth daily.     Current Facility-Administered Medications  Medication Dose Route Frequency Provider Last Rate Last Admin   famotidine (PEPCID) tablet 20 mg  20 mg Oral Once Acquanetta Chain, DO        SURGICAL HISTORY:  Past Surgical History:  Procedure Laterality Date   IR PERC PLEURAL DRAIN W/INDWELL CATH W/IMG GUIDE  11/09/2022   WISDOM TOOTH EXTRACTION      REVIEW OF SYSTEMS:  Constitutional: positive for anorexia and fatigue Eyes: positive for visual disturbance Ears, nose, mouth, throat, and face: positive for nasal congestion Respiratory: positive for cough, dyspnea on exertion,  and pleurisy/chest pain Cardiovascular: negative Gastrointestinal: negative Genitourinary:negative Integument/breast: negative Hematologic/lymphatic: negative Musculoskeletal:negative Neurological: negative Behavioral/Psych: positive for anxiety Endocrine: negative Allergic/Immunologic: negative   PHYSICAL EXAMINATION: General appearance: alert, cooperative, fatigued, and no distress Head: Normocephalic, without obvious abnormality, atraumatic Neck: no adenopathy, no JVD, supple, symmetrical, trachea midline, and thyroid not enlarged, symmetric, no tenderness/mass/nodules Lymph nodes: Cervical, supraclavicular, and axillary nodes normal. Resp: diminished breath sounds LLL and dullness to percussion LLL Back: symmetric, no curvature. ROM normal. No CVA tenderness. Cardio: regular rate and rhythm, S1, S2 normal, no murmur, click, rub or gallop and Tachycardic GI: soft, non-tender; bowel sounds normal; no masses,  no organomegaly Extremities: extremities normal, atraumatic, no cyanosis or edema Neurologic: Alert and oriented X 3, normal strength and tone.  Normal symmetric reflexes. Normal coordination and gait  ECOG PERFORMANCE STATUS: 1 - Symptomatic but completely ambulatory  Blood pressure 111/77, pulse 98, temperature 97.8 F (36.6 C), temperature source Temporal, resp. rate 15, height 5\' 6"  (1.676 m), weight 130 lb 3.2 oz (59.1 kg), last menstrual period 10/14/2022, SpO2 97 %, not currently breastfeeding.  LABORATORY DATA: Lab Results  Component Value Date   WBC 67.8 (HH) 11/20/2022   HGB 15.4 (H) 11/20/2022   HCT 44.7 11/20/2022   MCV 85.1 11/20/2022   PLT 142 (L) 11/20/2022      Chemistry      Component Value Date/Time   NA 130 (L) 11/20/2022 0906   NA 140 11/24/2020 1106   K 5.3 (H) 11/20/2022 0906   CL 94 (L) 11/20/2022 0906   CO2 28 11/20/2022 0906   BUN 15 11/20/2022 0906   BUN 7 11/24/2020 1106   CREATININE 0.90 11/20/2022 0906      Component Value Date/Time    CALCIUM 8.4 (L) 11/20/2022 0906   ALKPHOS 72 11/20/2022 0906   AST 14 (L) 11/20/2022 0906   ALT 46 (H) 11/20/2022 0906   BILITOT 0.6 11/20/2022 0906       RADIOGRAPHIC STUDIES: US OB LESS THAN 14 WEEKS WITH OB TRANSVAGINAL  Result Date: 11/18/2022 CLINICAL DATA:  Positive urine pregnancy test, IUD placement, evaluate for viability, cancer treatment candidate EXAM: OBSTETRIC <14 WK ULTRASOUND TECHNIQUE: Transabdominal ultrasound was performed for evaluation of the gestation as well as the maternal uterus and adnexal regions. COMPARISON:  None Available. FINDINGS: Intrauterine gestational sac: None Yolk sac:  Not Visualized. Embryo:  Not Visualized. Cardiac Activity: Not Visualized. Heart Rate: Not visualized. Subchorionic hemorrhage:  None visualized. Maternal uterus/adnexae: IUD positioned in the fundal endometrial cavity. IMPRESSION: 1. No candidate intrauterine gestation identified by ultrasound. No ultrasound evidence of adnexal mass or other findings to suggest ectopic pregnancy. Early pregnancy of unknown location. Recommend ectopic precautions and serial beta HCG to resolution. 2.  IUD positioned in the fundal endometrial cavity. Electronically Signed   By: Delanna Ahmadi M.D.   On: 11/18/2022 21:04   NM PET Image Initial (PI) Skull Base To Thigh (F-18 FDG)  Result Date: 11/15/2022 CLINICAL DATA:  Initial treatment strategy for non-small cell left lung cancer. EXAM: NUCLEAR MEDICINE PET SKULL BASE TO THIGH TECHNIQUE: 7.1 mCi F-18 FDG was injected intravenously. Full-ring PET imaging was performed from the skull base to thigh after the radiotracer. CT data was obtained and used for attenuation correction and anatomic localization. Fasting blood glucose: 152 mg/dl COMPARISON:  11/07/2022 CT chest, abdomen and pelvis. FINDINGS: Mediastinal blood pool activity: SUV max 1.8 Liver activity: SUV max NA NECK: Hypermetabolic small bilateral supraclavicular lymph nodes, for example 0.6 cm on the right  with max SUV 7.4 (series 4/image 46). Incidental CT findings: None. CHEST: Intensely hypermetabolic poorly defined left lower lobe 8.6 x 7.4 cm lung mass probably directly contiguous with the subpulmonic left pleural space with max SUV 16.0 (series 4/image 94). Moderate loculated malignant left pleural effusion with widespread hypermetabolic left pleural thickening/pleural soft tissue deposits with representative max SUV 11.4 at the medial apical left pleural space and representative max SUV 13.3 at the extreme peripheral basilar left pleural space. Intensely hypermetabolic bilateral mediastinal adenopathy involving the bilateral paratracheal, subcarinal, AP window, left prevascular, left pericardiophrenic and bilateral internal mammary nodal chains. Representative 2.1 cm right paratracheal node with max SUV 9.4 (series 4/image 62). Representative 1.6 cm left prevascular node with max SUV 9.8 (series 4/image 65).  Representative 1.5 cm left internal mammary node with max SUV 12.4 (series 4/image 64). Hypermetabolic left hilar adenopathy with max SUV 12.0. No enlarged or hypermetabolic right axillary nodes. Mildly hypermetabolic nonenlarged 0.7 cm left axillary node with max SUV 3.1 (series 4/image 68). Three scattered solid right pulmonary nodules, largest 0.9 cm in the medial right middle lobe with low-level hypermetabolism with max SUV 2.0 (series 7/image 44). Incidental CT findings: Basilar left pleural drain in place. Moderate extrinsic narrowing of the central left upper and left lower lobe airways. ABDOMEN/PELVIS: Hypermetabolic 0.8 cm gastrohepatic ligament node with max SUV 6.4 (series 4/image 112). No abnormal hypermetabolic activity within the liver, pancreas, adrenal glands, or spleen. No hypermetabolic lymph nodes in the pelvis. Incidental CT findings: IUD appears grossly well-positioned in the uterine cavity. SKELETON: No focal hypermetabolic activity to suggest skeletal metastasis. Generalized low level  marrow uptake throughout the skeleton compatible with reactive marrow state. Incidental CT findings: None. IMPRESSION: 1. Intensely hypermetabolic poorly defined left lower lobe 8.6 cm lung mass probably directly contiguous with the subpulmonic left pleural space, compatible with primary bronchogenic malignancy. 2. Moderate loculated malignant left pleural effusion with widespread hypermetabolic left pleural thickening/pleural soft tissue deposits. 3. Hypermetabolic bilateral supraclavicular, bilateral mediastinal, left hilar and gastrohepatic ligament nodal metastases. 4. Three scattered solid right pulmonary nodules, largest 0.9 cm in the medial right middle lobe with low-level hypermetabolism, compatible with pulmonary metastases. Electronically Signed   By: Ilona Sorrel M.D.   On: 11/15/2022 16:52   DG Chest 2 View  Result Date: 11/15/2022 CLINICAL DATA:  Malignant pleural effusion. EXAM: CHEST - 2 VIEW COMPARISON:  AP chest 11/09/2022 and CT chest 11/07/2022 FINDINGS: Cardiac silhouette and mediastinal contours are within limits. Moderate left pleural effusion associated left basilar lung opacities, similar to prior 11/09/2022 radiograph and corresponding to the loculated pleural fluid within the left lung posterior, lateral, and major fissure pleura. The right lung appears clear. Likely unchanged tiny left apical pneumothorax, better seen on prior CT. No acute skeletal abnormality. IMPRESSION: Moderate left pleural effusion associated left basilar lung opacities, similar to prior 11/09/2022 radiograph and corresponding to the loculated pleural fluid, lung atelectasis, and possible infrahilar lymphadenopathy/lung tumor better seen on prior CT. Electronically Signed   By: Yvonne Kendall M.D.   On: 11/15/2022 12:00   IR PERC PLEURAL DRAIN W/INDWELL CATH W/IMG GUIDE  Result Date: 11/09/2022 INDICATION: 37 year old with recently diagnosed adenocarcinoma and malignant left pleural effusion. Patient recently  had a pigtail chest tube removed and now has re-accumulation of left pleural fluid. Plan for placement of a tunneled left pleural catheter. EXAM: PLACEMENT OF TUNNELED LEFT PLEURAL CATHETER USING ULTRASOUND AND FLUOROSCOPIC GUIDANCE MEDICATIONS: Ancef 2 g ANESTHESIA/SEDATION: Versed 2 mg Patient was monitored by a radiology nurse throughout the procedure. COMPLICATIONS: None immediate. PROCEDURE: Informed written consent was obtained from the patient after a thorough discussion of the procedural risks, benefits and alternatives. All questions were addressed. Maximal Sterile Barrier Technique was utilized including caps, mask, sterile gowns, sterile gloves, sterile drape, hand hygiene and skin antiseptic. A timeout was performed prior to the initiation of the procedure. Patient was placed on her right side. Ultrasound was used to evaluate the left pleural fluid. The left mid axillary region was prepped and draped in sterile fashion. Two areas in the left lateral lower chest were anesthetized using 1% lidocaine with epinephrine. Two small incisions were made. The PleurX catheter was tunneled between these incisions and the cuff was placed underneath the skin. Using ultrasound  guidance, pleural catheter was directed into the pleural space through the more lateral incision. Pleural catheter was confirmed within the pleural fluid using ultrasound and amber colored fluid was aspirated. Wire was advanced into the pleural space and the tract was dilated to accommodate the peel-away sheath. Pleural catheter was advanced through the peel-away sheath and into the pleural space. Approximately 600 mL of amber colored fluid was removed from the left pleural space. The pleural entrance dermatotomy site was closed using absorbable suture and Dermabond. Catheter was secured to the skin with suture. Dressings were placed. Fluoroscopic and ultrasound images were taken and saved for documentation. FINDINGS: Large pocket of pleural  fluid in the left lower chest. However, there is very complex loculated left pleural fluid as well. The largest pocket in left lower chest was targeted for the PleurX catheter placement. Approximately 600 mL of fluid was removed. IMPRESSION: Successful placement of a tunneled left pleural catheter using ultrasound and fluoroscopic guidance. Electronically Signed   By: Markus Daft M.D.   On: 11/09/2022 17:37   DG Chest Port 1 View  Result Date: 11/09/2022 CLINICAL DATA:  Non-small-cell lung cancer.  Drain placement. EXAM: PORTABLE CHEST 1 VIEW COMPARISON:  11/09/2022, earlier same day FINDINGS: Diffuse interstitial opacity in the left lung is similar to prior with persistent left base collapse/consolidation and moderate left pleural effusion. Hazy opacity over the left apex may represent fluid in the fissure or loculated posterior pleural effusions seen on previous CT imaging. Left pleural drain is new in the interval. Right lung remains clear. Cardiopericardial silhouette is at upper limits of normal for size. The visualized bony structures of the thorax are unremarkable. IMPRESSION: Interval placement of left pleural drain with persistent left base collapse/consolidation and moderate left pleural effusion. Electronically Signed   By: Misty Stanley M.D.   On: 11/09/2022 12:55   [place] CHEST (PLEURAL EFFUSION)  Result Date: 11/09/2022 CLINICAL DATA:  Evaluate pleural effusion. EXAM: CHEST ULTRASOUND COMPARISON:  Chest radiograph 11/09/2022 FINDINGS: There is a moderate amount of complicated fluid demonstrated within the left chest compatible with pleural effusion. No significant right pleural effusion identified. IMPRESSION: Moderate left pleural effusion. Electronically Signed   By: Lovey Newcomer M.D.   On: 11/09/2022 09:51   DG CHEST PORT 1 VIEW  Result Date: 11/09/2022 CLINICAL DATA:  Pleural effusion. EXAM: PORTABLE CHEST 1 VIEW COMPARISON:  11/08/2022 FINDINGS: Patient is slightly rotated to the right.  Interval removal of left basilar pigtail pleural drainage catheter. Stable to slight interval worsening of large left pleural effusion with loculation over the left upper lobe/apex. No definite left apical pneumothorax. Right lung is clear. Cardiomediastinal silhouette and remainder of the exam is unchanged. IMPRESSION: Stable to slight interval worsening of large left pleural effusion with loculation over the left upper lobe/apex. Interval removal of left basilar pigtail pleural drainage catheter. No definite left pneumothorax. Electronically Signed   By: Marin Olp M.D.   On: 11/09/2022 08:11   DG CHEST PORT 1 VIEW  Result Date: 11/08/2022 CLINICAL DATA:  Pleural effusion. EXAM: PORTABLE CHEST 1 VIEW COMPARISON:  AP chest 11/07/2022 at 4:54 a.m. FINDINGS: Unchanged position of left basilar pigtail chest tube. Mild-to-moderate left pleural effusion with associated left midlung heterogeneous opacities, unchanged. The right lung is clear. Note is made of small left apical pneumothorax, unchanged from 11/07/2022 radiograph and better seen on most recent study of 11/07/2022 chest CT. The visualized cardiac silhouette and mediastinal contours are within normal limits. No acute skeletal abnormality. IMPRESSION: 1. Unchanged  position of left basilar pigtail chest tube. 2. Left basilar lower lung opacities, representing a combination of pleural effusion, atelectasis, and left perihilar and lower lobe lung tumor better seen on yesterday's chest CT. 3. Unchanged small left apical pneumothorax. Electronically Signed   By: Yvonne Kendall M.D.   On: 11/08/2022 08:23   MR BRAIN W WO CONTRAST  Result Date: 11/08/2022 CLINICAL DATA:  Non-small cell lung cancer, staging EXAM: MRI HEAD WITHOUT AND WITH CONTRAST TECHNIQUE: Multiplanar, multiecho pulse sequences of the brain and surrounding structures were obtained without and with intravenous contrast. CONTRAST:  80mL GADAVIST GADOBUTROL 1 MMOL/ML IV SOLN COMPARISON:  None  Available. FINDINGS: Brain: No abnormal parenchymal or meningeal enhancement. No restricted diffusion to suggest acute or subacute infarct. No acute hemorrhage, mass, mass effect, or midline shift. No hydrocephalus or extra-axial collection. No hemosiderin deposition to suggest remote hemorrhage. Normal pituitary. Low lying cerebellar tonsils, which extend 3 mm below the foramen magnum and do not meet criteria for Chiari I malformation. Vascular: Patent arterial flow voids. Skull and upper cervical spine: 8 mm enhancing lesions in the right frontal calvarium (series 16, image 129 and 135). Sinuses/Orbits: Clear paranasal sinuses. No acute finding in the orbits. Other: The mastoid air cells are well aerated. IMPRESSION: 1. No evidence of metastatic disease in the brain. 2. 8 mm enhancing lesions in the right frontal calvarium, concerning for osseous metastatic disease in the setting of primary lung cancer. Electronically Signed   By: Merilyn Baba M.D.   On: 11/08/2022 01:32   CT CHEST ABDOMEN PELVIS W CONTRAST  Result Date: 11/07/2022 CLINICAL DATA:  Non-small cell lung cancer staging * Tracking Code: BO * EXAM: CT CHEST, ABDOMEN, AND PELVIS WITH CONTRAST TECHNIQUE: Multidetector CT imaging of the chest, abdomen and pelvis was performed following the standard protocol during bolus administration of intravenous contrast. RADIATION DOSE REDUCTION: This exam was performed according to the departmental dose-optimization program which includes automated exposure control, adjustment of the mA and/or kV according to patient size and/or use of iterative reconstruction technique. CONTRAST:  169mL OMNIPAQUE IOHEXOL 300 MG/ML  SOLN COMPARISON:  11/04/2022 CT chest, and CT abdomen from 10/09/2011 FINDINGS: CT CHEST FINDINGS Cardiovascular: Suspected pericardial effusion especially along the cardiac apex, potentially with associated complexity as on image 44 series 2. Mediastinum/Nodes: Bulky but indistinctly marginated  right and left paratracheal, AP window, prevascular, left internal mammary, subcarinal, left hilar, and left infrahilar adenopathy. Index right paratracheal node 1.9 cm in short axis, image 22 series 2. Index left internal mammary node 1.4 cm in short axis, image 24 series 2. The hilar and infrahilar adenopathy is somewhat conglomerate and difficult to individually measure. AP window lymph node 1.4 cm in short axis, image 24 series 2. Lungs/Pleura: Substantially reduced size of the left pleural effusion with a left-sided chest tube in place. Scattered loculations of gas in the left pleural space along with loculations of fluid along the major fissure and posteriorly. The left lower lobe bronchus is essentially occluded by central adenopathy in tumor, with the left upper lobe bronchus narrowed due to surrounding adenopathy. Complex appearance at the left lung base likely due to a combination of tumor in atelectatic lung for example on image 69 series 4. Suspected extensive tumor versus adherent atelectatic lung along the left diaphragm and pleural surface. Irregular deposition of tumor posteriorly along the pleural and diaphragmatic margin for example on image 95 series 4. It is difficult to confidently measure tumor separate from atelectatic lung but suspected tumor along  the diaphragmatic surface measures about 9.9 by 6.4 cm on image 47 series 2. Secondary pulmonary lobular interstitial accentuation throughout the left lung. Small left apical hydropneumothorax with air-fluid level. Scattered ground-glass opacity in the aerated portions of the left lower lobe. Right apical pleuroparenchymal scarring. Right middle lobe nodule 0.9 by 0.6 cm, image 103 series 6. In the right lower lobe there are a few nodules measuring up to about 5 mm in diameter which could be inflammatory or metastatic. Musculoskeletal: Unremarkable CT ABDOMEN PELVIS FINDINGS Hepatobiliary: Unremarkable Pancreas: Unremarkable Spleen: Unremarkable  Adrenals/Urinary Tract: No adrenal mass. The kidneys appear unremarkable. There is contrast medium in the collecting systems which could obscure nonobstructive calculi, but there is no hydronephrosis or hydroureter. Stomach/Bowel: Prominent stool throughout the colon favors constipation. Vascular/Lymphatic: A right gastric lymph node measures 10 mm in short axis on image 56 series 2. Reproductive: An IUD is satisfactorily positioned along the endometrium. Adnexa unremarkable. Other: There is a small amount of right eccentric free pelvic fluid in the cul-de-sac on image 103 series 2, possibly physiologic but technically nonspecific. No well-defined nodularity along the omentum or peritoneal surfaces. Musculoskeletal: Mild edema tracks along the left lateral abdominal wall musculature, nonspecific and possibly related to the left chest tube. No specific indicators of osseous metastatic disease. IMPRESSION: 1. Bulky mediastinal, left hilar, and left infrahilar adenopathy compatible with malignancy. The left lower lobe bronchus is occluded and the left upper lobe bronchus is narrowed due to surrounding adenopathy. There is a large amount of tumor along the left pleural surface and diaphragm, along with additional pleural deposits of tumor, and a loculated left hydropneumothorax with chest tube in place. 2. There is also a 9 by 6 mm right middle lobe nodule and a few small nodules in the right lower lobe which could be inflammatory or metastatic. 3. Secondary pulmonary lobular interstitial accentuation in the left lung, probably from lymphangitic spread of tumor or edema. 4. Suspected pericardial effusion especially along the cardiac apex, potentially with associated complexity. Malignant pericardial effusion not excluded. 5. Small amount of free pelvic fluid in the cul-de-sac, possibly physiologic but technically nonspecific. 6. Prominent stool throughout the colon favors constipation. 7. Mild edema along the left  lateral abdominal wall musculature, nonspecific and possibly related to the left chest tube. 8. The only finding of potential extra thoracic malignant involvement is a mildly enlarged right gastric lymph node (1.0 cm in diameter). Electronically Signed   By: Van Clines M.D.   On: 11/07/2022 21:54   DG CHEST PORT 1 VIEW  Result Date: 11/07/2022 CLINICAL DATA:  Pleural effusion on left. EXAM: PORTABLE CHEST 1 VIEW COMPARISON:  AP chest 11/06/2022, CT chest 11/04/2022 FINDINGS: Redemonstration of left basilar pigtail chest tube. Mild interval improvement in now mild-to-moderate left pleural effusion, previously extending 60% up the left hemithorax and now extending 30-40% up the left hemithorax. Associated left midlung linear densities. Minimal right costophrenic angle linear subsegmental atelectasis. No pneumothorax. Visualized cardiac silhouette and mediastinal contours are within normal limits. No acute skeletal abnormality. IMPRESSION: Redemonstration of left basilar pigtail chest tube. Mild interval improvement in now mild-to-moderate left pleural effusion Electronically Signed   By: Yvonne Kendall M.D.   On: 11/07/2022 08:29   DG CHEST PORT 1 VIEW  Result Date: 11/06/2022 CLINICAL DATA:  Chest tube placement.  [id number] EXAM: PORTABLE CHEST 1 VIEW COMPARISON:  11/06/2022 at 4:18 a.m. FINDINGS: There is a large left pleural effusion with basilar atelectasis or consolidation. Similar appearance to prior study. A left  chest tube has been placed in the lower chest region. The right lung is clear. Heart size is obscured by the left pleural/parenchymal process. No pneumothorax. IMPRESSION: Large left pleural effusion with infiltration or atelectasis in the left base. Interval placement of a left chest tube. No pneumothorax. Electronically Signed   By: Lucienne Capers M.D.   On: 11/06/2022 17:49   DG CHEST PORT 1 VIEW  Result Date: 11/06/2022 CLINICAL DATA:  Follow-up pleural effusion. EXAM: PORTABLE  CHEST 1 VIEW COMPARISON:  11/05/22 FINDINGS: Stable cardiomediastinal contours. There is a large left pleural effusion which appears unchanged in volume from the previous exam. Right lung appears clear. No new findings. IMPRESSION: No change in large left pleural effusion. Electronically Signed   By: Kerby Moors M.D.   On: 11/06/2022 08:15   DG CHEST PORT 1 VIEW  Result Date: 11/05/2022 CLINICAL DATA:  Post thoracentesis. EXAM: PORTABLE CHEST 1 VIEW COMPARISON:  Chest x-ray from earlier same day. FINDINGS: Decreased size of the LEFT pleural effusion status post thoracentesis. Large opacity persists at the LEFT lung base, likely residual pleural effusion and/or atelectasis/airspace collapse. RIGHT lung is clear. No pneumothorax is seen. Heart size and mediastinal contours are grossly stable. IMPRESSION: 1. Decreased size of the LEFT pleural effusion status post thoracentesis. No pneumothorax. 2. Large opacity persists at the LEFT lung base, likely residual pleural effusion and/or atelectasis/airspace collapse. Electronically Signed   By: Franki Cabot M.D.   On: 11/05/2022 13:24   DG CHEST PORT 1 VIEW  Result Date: 11/05/2022 CLINICAL DATA:  Short of breath EXAM: PORTABLE CHEST 1 VIEW COMPARISON:  11/04/2022 FINDINGS: LEFT cardiac silhouette is obscured large LEFT effusion. LEFT pleural effusion occupies 75% of LEFT hemithorax and is increased comparison exam. RIGHT lung clear. No pneumothorax. IMPRESSION: Increasing large LEFT effusion occupying 75% of the LEFT hemithorax. Electronically Signed   By: Suzy Bouchard M.D.   On: 11/05/2022 10:14   CT Angio Chest PE W and/or Wo Contrast  Result Date: 11/04/2022 CLINICAL DATA:  Pneumonia and worsening shortness of breath EXAM: CT ANGIOGRAPHY CHEST WITH CONTRAST TECHNIQUE: Multidetector CT imaging of the chest was performed using the standard protocol during bolus administration of intravenous contrast. Multiplanar CT image reconstructions and MIPs were  obtained to evaluate the vascular anatomy. RADIATION DOSE REDUCTION: This exam was performed according to the departmental dose-optimization program which includes automated exposure control, adjustment of the mA and/or kV according to patient size and/or use of iterative reconstruction technique. CONTRAST:  62mL OMNIPAQUE IOHEXOL 350 MG/ML SOLN COMPARISON:  Chest radiograph dated 10/30/2022 FINDINGS: Cardiovascular: The study is high quality for the evaluation of pulmonary embolism. There are no filling defects in the central, lobar, segmental or subsegmental pulmonary artery branches to suggest acute pulmonary embolism. Great vessels are normal in course and caliber. Normal heart size. No significant pericardial fluid/thickening. Mediastinum/Nodes: Imaged thyroid gland without nodules meeting criteria for imaging follow-up by size. Rightward shift of the mediastinum. Normal esophagus. Multi station lymphadenopathy including 1.3 cm left internal mammary lymph node(5:98), 2.0 cm subcarinal lymph node (4:59), and 1.4 cm prevascular lymph node (4:51). Lungs/Pleura: The central airways are patent. Near-complete atelectasis of the lingula and left lower lobe with heterogeneous consolidation resulting in marked narrowing of the left sided airways. Dependent ground-glass opacity in the left upper lobe. No pneumothorax. Large left pleural effusion. Upper abdomen: Normal. Musculoskeletal: No acute or abnormal lytic or blastic osseous lesions. Review of the MIP images confirms the above findings. IMPRESSION: 1. No acute pulmonary embolism.  2. Near-complete atelectasis of the lingula and left lower lobe with heterogeneous consolidation resulting in marked narrowing of the left sided airways, likely multifocal pneumonia. 3. Large left pleural effusion. 4. Multi station mediastinal lymphadenopathy likely reactive Electronically Signed   By: Darrin Nipper M.D.   On: 11/04/2022 14:51   DG Chest Port 1 View  Result Date:  11/04/2022 CLINICAL DATA:  Provided history: Left chest pain. EXAM: PORTABLE CHEST 1 VIEW COMPARISON:  Prior chest radiograph 10/30/2022. FINDINGS: Significant interval increase in size of a left pleural effusion, now moderate-to-large. Underlying atelectasis and/or airspace consolidation within the mid and lower left lung, also significantly progressed. No appreciable airspace consolidation on the right. Portions of the cardiac silhouette are obscured on the left, limiting evaluation of heart size. No evidence of pneumothorax. No acute bony abnormality identified. IMPRESSION: Significant interval increase in size of a left pleural effusion, now moderate-to-large. Underlying atelectasis and/or airspace consolidation within the mid and lower left lung has also significantly progressed from the prior examination of 10/30/2022. Electronically Signed   By: Kellie Simmering D.O.   On: 11/04/2022 12:22   DG Chest Portable 1 View  Result Date: 10/30/2022 CLINICAL DATA:  Cough, headache, fatigue, night sweats. EXAM: PORTABLE CHEST 1 VIEW COMPARISON:  Overlapping portions CT abdomen 10/09/2011 FINDINGS: Left lower lobe and possible lingular consolidation. Pleural thickening along the left lateral costophrenic angle may reflect adjacent pleural effusion. The right lung appears clear. Cardiac and mediastinal margins appear normal. IMPRESSION: 1. Left lower lobe and possible lingular consolidation with small left pleural effusion. Findings favor left basilar pneumonia over atelectasis. Followup PA and lateral chest X-ray is recommended in 3-4 weeks following trial of antibiotic therapy to ensure resolution and exclude the unlikely possibility of underlying malignancy. Electronically Signed   By: Van Clines M.D.   On: 10/30/2022 10:54    ASSESSMENT AND PLAN: This is a very pleasant 37 years old white female with  Stage IVB (T4, N3, M1b) non-small cell lung cancer, adenocarcinoma presented with large left diaphragmatic  surface mass in addition to left hilar, infrahilar, AP window, right and left paratracheal, subcarinal as well as left internal mammary lymphadenopathy in addition to right gastric lymph node and solitary small right frontal calvarial osseous metastatic disease with large malignant left pleural effusion diagnosed in January 2024.   Fortunately her molecular studies showed positive ROS1 fusion.  The patient also had PD-L1 expression of 98%. She had a PET scan performed recently that showed finding consistent with the previous imaging studies and no evidence of liver metastasis.  I personally and independently reviewed the imaging studies and showed the images to the patient and her mother. I had a lengthy discussion with the patient and her mother today about her current condition and treatment options.  I explained to the patient that there are several targeted therapies approved for treatment of patient with ROS1 fusion including the most approval with repotrectinib (Augtyro).  Other options include Entrectinib, crizotinib and Lorlatinib. I provided the patient with information about the efficacy as well as the adverse effect of repotrectinib and Entrectinib and we made the shared decision of treatment with repotrectinib initially as 160 mg p.o. daily for 2 weeks followed by increase of the dose to 160 mg p.o. twice daily if she tolerated the first 2 weeks well. I discussed with the patient the adverse effect of this treatment including but not limited to dizziness, peripheral neuropathy, some cognitive disorders as well as nausea, vomiting, diarrhea, fatigue, muscle weakness,  visual disorders and weight gain.  I also arrange for the patient to meet with the pharmacist for oral oncolytic for education and also to help the patient with the refill of her medication. I hope the patient will be able to start her treatment in the next few days. Her leukocytosis today is likely secondary to the treatment of  steroid over the last 2 weeks. I repeated her urine and serum pregnancy test and these were negative.  I strongly recommend for the patient to take extra precaution to avoid any pregnancy during her treatment. For the cough, I will start her on Tessalon Perles For pain management she is currently on oxycodone and followed by the palliative care team. I will see the patient back for follow-up visit in around 2-3 weeks for evaluation and repeat blood work. She was advised to call immediately if she has any other concerning symptoms in the interval. The patient voices understanding of current disease status and treatment options and is in agreement with the current care plan.  All questions were answered. The patient knows to call the clinic with any problems, questions or concerns. We can certainly see the patient much sooner if necessary.  The total time spent in the appointment was 55 minutes.  Disclaimer: This note was dictated with voice recognition software. Similar sounding words can inadvertently be transcribed and may not be corrected upon review.

## 2022-11-21 ENCOUNTER — Telehealth: Payer: Self-pay | Admitting: Medical Oncology

## 2022-11-21 ENCOUNTER — Telehealth: Payer: Self-pay | Admitting: Pulmonary Disease

## 2022-11-21 ENCOUNTER — Encounter: Payer: Self-pay | Admitting: Medical Oncology

## 2022-11-21 ENCOUNTER — Other Ambulatory Visit (HOSPITAL_COMMUNITY): Payer: Self-pay

## 2022-11-21 ENCOUNTER — Ambulatory Visit: Payer: BC Managed Care – PPO | Admitting: Pulmonary Disease

## 2022-11-21 DIAGNOSIS — C3492 Malignant neoplasm of unspecified part of left bronchus or lung: Secondary | ICD-10-CM | POA: Diagnosis not present

## 2022-11-21 DIAGNOSIS — F419 Anxiety disorder, unspecified: Secondary | ICD-10-CM | POA: Diagnosis not present

## 2022-11-21 DIAGNOSIS — Z4801 Encounter for change or removal of surgical wound dressing: Secondary | ICD-10-CM | POA: Diagnosis not present

## 2022-11-21 DIAGNOSIS — Z8701 Personal history of pneumonia (recurrent): Secondary | ICD-10-CM | POA: Diagnosis not present

## 2022-11-21 DIAGNOSIS — J910 Malignant pleural effusion: Secondary | ICD-10-CM | POA: Diagnosis not present

## 2022-11-21 DIAGNOSIS — Z7951 Long term (current) use of inhaled steroids: Secondary | ICD-10-CM | POA: Diagnosis not present

## 2022-11-21 DIAGNOSIS — D72829 Elevated white blood cell count, unspecified: Secondary | ICD-10-CM | POA: Diagnosis not present

## 2022-11-21 DIAGNOSIS — D630 Anemia in neoplastic disease: Secondary | ICD-10-CM | POA: Diagnosis not present

## 2022-11-21 DIAGNOSIS — Z48813 Encounter for surgical aftercare following surgery on the respiratory system: Secondary | ICD-10-CM | POA: Diagnosis not present

## 2022-11-21 DIAGNOSIS — Z9181 History of falling: Secondary | ICD-10-CM | POA: Diagnosis not present

## 2022-11-21 NOTE — Telephone Encounter (Signed)
[facility] reported pt heart rate 126-134.She wants parameters for when to call.   I called pt and she said she is not any more SOB than usual and her pain is not new or different. I instructed her to go to ED for worsening SOB, onset of acute chest pain or she feels dizzy , lightheaded . She voiced understanding.  She urgently requested an out of work letter be e-mailed  today for her FMLA extension. Done.  Constipation - she is starting miralax  Updated Melissa Pineda re pt can take tylenol and take Miralax.

## 2022-11-21 NOTE — Telephone Encounter (Signed)
Oral Chemotherapy Pharmacist Encounter   Called and spoke with our Access and Reimbursement Manager, Erline Levine, for [employer] to discuss trying to expedite patient's enrollment for patient assistance as patient is very eager to get started and has even offered to pay out of pocket cash price for drug (estimated cost of this would be ~$8000 for 14 day supply).  Erline Levine is working on trying to expedite process as quickly as possible for patient at this time. She advised waiting to see if patient is eligible for bridge program as she is commercially insured and if she is facing a delay of 5 or more days of starting therapy. This would help patient avoid spending out of pocket cash price for medication. Waiting on follow up for next steps from [employer] manager at this time.   Oral chemotherapy clinic will continue to follow.  Leron Croak, PharmD, BCPS, BCOP Hematology/Oncology Clinical Pharmacist [facility] [place] [phone] 11/21/2022 11:27 AM

## 2022-11-21 NOTE — Telephone Encounter (Signed)
[facility] Nurse calling. Needs Dr.'s OK for her to She is not applying the Tegaderm dressing on the plurex. She is using foam and gaze due to her skin reaction to the Tegaderm. If Dr is not OK with that please call. [phone] . Dr.  saw her with it in last visit and she said he was OK with it.

## 2022-11-21 NOTE — Telephone Encounter (Signed)
Called Melissa Pineda back at [facility] and told her verbally that Dr Valeta Harms is ok with how she is taking care of her skin. Nothing further needed

## 2022-11-21 NOTE — Telephone Encounter (Signed)
Called and spoke with Nurse Eustaquio Maize from [facility] and she is needing verbal order that it is ok   Needs Dr.'s OK for her to She is not applying the Tegaderm dressing on the plurex. She is using foam and gaze due to her skin reaction to the Tegaderm.   Apparently when she used the Tegaderm dressage she broke out in a really bad rash or hives per nurse. She is currently using foam and gauze due to her sensitive skin.  Sir are you ok with this

## 2022-11-22 ENCOUNTER — Other Ambulatory Visit (HOSPITAL_BASED_OUTPATIENT_CLINIC_OR_DEPARTMENT_OTHER): Payer: Self-pay

## 2022-11-22 ENCOUNTER — Other Ambulatory Visit (HOSPITAL_COMMUNITY): Payer: Self-pay

## 2022-11-22 ENCOUNTER — Other Ambulatory Visit: Payer: Self-pay | Admitting: Internal Medicine

## 2022-11-22 DIAGNOSIS — J910 Malignant pleural effusion: Secondary | ICD-10-CM | POA: Diagnosis not present

## 2022-11-22 DIAGNOSIS — C3490 Malignant neoplasm of unspecified part of unspecified bronchus or lung: Secondary | ICD-10-CM | POA: Diagnosis not present

## 2022-11-22 NOTE — Telephone Encounter (Signed)
Oral Chemotherapy Pharmacist Encounter   Received follow up from [employer] representative that free trial offer for Augtyro is being processed at [facility], and once ready to ship out, they will reach out to patient to set up shipment. Augtyro will likely not arrive to patient until next week at the earliest. Obtaining the medication has taken significant coordination of care as patient's insurance does not cover this as it is not on their formulary. As Izola Price is a new to market oncology specialty medication, this has taken extensive coordination with the manufacturer to get medication in patient's hand. Typical turn around time for this type of specialty medication is a minimum of 5 business days, but can range upwards to 10-14 business days when manufacturer assistance is required.   Patient has elected in the meantime to start therapy with Rozlytrek (entrectinib) for EZR-ROS1 fusion positive stage IV non-small cell lung cancer until Augtyro arrives. Patient discussed this with Dr. Julien Nordmann 11/22/22. Plan per Dr. Julien Nordmann will be for patient to start standard dose of Rozlytrek 200 mg capsules, 3 capsules (600 mg total) by mouth daily.   Patient's mother in law came to the cancer center and picked up a 30 day supply sample of [place] for patient to start 11/22/22.  Counseled patient's mother in law on administration, dosing, side effects, monitoring, drug-food interactions, safe handling, storage, and disposal.  Patient will take Rozlytrek 200mg  capsules, 3 capsules (600mg ) by mouth once daily without regard to food.  Patient's mother in law knows patient will need to avoid grapefruit and grapefruit juice.  Rozlytrek start date: 11/22/22  Side effects include but not limited to: GI effects (such as constipation, diarrhea, nausea, taste changes), fatigue, edema, CNS effects (dizzines, cognitive dysfunction, mood disorders, sleep disturbances), CBC changes (anemia, neutropenia), electrolyte  imbalances (hyponatremia, hypocalcemia, hyperuricemia), increase in liver enzymes, and visual disturbances.    Reviewed importance of keeping a medication schedule and plan for any missed doses. No barriers to medication adherence identified.  Leron Croak, PharmD, BCPS, BCOP Hematology/Oncology Clinical Pharmacist [facility] [place] [phone] 11/22/2022 12:24 PM

## 2022-11-22 NOTE — Telephone Encounter (Signed)
Oral Oncology Pharmacist Encounter  Received follow up email update from [employer] that patient is estimated to receive Augtyro from [facility] today, 11/22/22 by 7PM. They have spoken with the patient and made her aware. Patient will start Augtyro therapy at this time and not the Rozlytrek samples that were picked up 11/22/22.  Leron Croak, PharmD, BCPS, BCOP Hematology/Oncology Clinical Pharmacist [facility] [place] [phone] 11/22/2022 1:04 PM

## 2022-11-25 ENCOUNTER — Telehealth: Payer: Self-pay | Admitting: Pulmonary Disease

## 2022-11-25 ENCOUNTER — Other Ambulatory Visit: Payer: Self-pay | Admitting: Internal Medicine

## 2022-11-25 ENCOUNTER — Telehealth: Payer: Self-pay | Admitting: Medical Oncology

## 2022-11-25 ENCOUNTER — Other Ambulatory Visit: Payer: Self-pay | Admitting: Medical Oncology

## 2022-11-25 DIAGNOSIS — C3492 Malignant neoplasm of unspecified part of left bronchus or lung: Secondary | ICD-10-CM

## 2022-11-25 DIAGNOSIS — M79604 Pain in right leg: Secondary | ICD-10-CM

## 2022-11-25 NOTE — Telephone Encounter (Signed)
Patient is double booked tomorrow, 2/6 at 2:30pm. Thanks Tammy!

## 2022-11-25 NOTE — Telephone Encounter (Signed)
Patient is double booked tomorrow, 2/6 at 2:30pm. Nothing further needed.

## 2022-11-25 NOTE — Telephone Encounter (Signed)
Dr Julien Nordmann ordered ultrasound for lower leg pain.

## 2022-11-25 NOTE — Telephone Encounter (Signed)
Bilateral lower leg pain @ shins and behind knees

## 2022-11-25 NOTE — Telephone Encounter (Signed)
Im in [place] today, hate for her to drive down here.  Melissa Pineda has an opening this evening see if she can see Melissa Pineda ,  If not double book me in am when I am back to [place].  Please contact office for sooner follow up if symptoms do not improve or worsen or seek emergency care

## 2022-11-26 ENCOUNTER — Other Ambulatory Visit: Payer: Self-pay | Admitting: Internal Medicine

## 2022-11-26 ENCOUNTER — Encounter: Payer: Self-pay | Admitting: Vascular Surgery

## 2022-11-26 ENCOUNTER — Telehealth: Payer: Self-pay | Admitting: Internal Medicine

## 2022-11-26 ENCOUNTER — Ambulatory Visit (HOSPITAL_BASED_OUTPATIENT_CLINIC_OR_DEPARTMENT_OTHER)
Admission: RE | Admit: 2022-11-26 | Discharge: 2022-11-26 | Disposition: A | Discharge status: Home / Self Care | Payer: BC Managed Care – PPO | Source: Ambulatory Visit | Attending: Internal Medicine | Admitting: Internal Medicine

## 2022-11-26 ENCOUNTER — Other Ambulatory Visit: Payer: Self-pay | Admitting: *Deleted

## 2022-11-26 ENCOUNTER — Ambulatory Visit: Payer: BC Managed Care – PPO

## 2022-11-26 ENCOUNTER — Other Ambulatory Visit (HOSPITAL_COMMUNITY): Payer: Self-pay | Admitting: Internal Medicine

## 2022-11-26 ENCOUNTER — Telehealth: Payer: Self-pay | Admitting: Medical Oncology

## 2022-11-26 ENCOUNTER — Ambulatory Visit: Payer: BC Managed Care – PPO | Admitting: Adult Health

## 2022-11-26 ENCOUNTER — Other Ambulatory Visit: Payer: Self-pay

## 2022-11-26 ENCOUNTER — Inpatient Hospital Stay (HOSPITAL_COMMUNITY)
Admit: 2022-11-26 | Discharge: 2022-11-29 | DRG: 271 | Disposition: A | Discharge status: Home / Self Care | Payer: BC Managed Care – PPO | Source: Ambulatory Visit | Attending: Vascular Surgery | Admitting: Vascular Surgery

## 2022-11-26 ENCOUNTER — Inpatient Hospital Stay: Payer: BC Managed Care – PPO | Attending: Internal Medicine

## 2022-11-26 ENCOUNTER — Encounter (HOSPITAL_COMMUNITY): Payer: Self-pay

## 2022-11-26 ENCOUNTER — Ambulatory Visit (INDEPENDENT_AMBULATORY_CARE_PROVIDER_SITE_OTHER): Payer: BC Managed Care – PPO | Admitting: Vascular Surgery

## 2022-11-26 ENCOUNTER — Inpatient Hospital Stay (HOSPITAL_COMMUNITY): Payer: BC Managed Care – PPO

## 2022-11-26 DIAGNOSIS — Z7901 Long term (current) use of anticoagulants: Secondary | ICD-10-CM | POA: Diagnosis not present

## 2022-11-26 DIAGNOSIS — M79604 Pain in right leg: Secondary | ICD-10-CM

## 2022-11-26 DIAGNOSIS — R000 Tachycardia, unspecified: Secondary | ICD-10-CM | POA: Diagnosis not present

## 2022-11-26 DIAGNOSIS — I70222 Atherosclerosis of native arteries of extremities with rest pain, left leg: Principal | ICD-10-CM | POA: Diagnosis present

## 2022-11-26 DIAGNOSIS — Z7902 Long term (current) use of antithrombotics/antiplatelets: Secondary | ICD-10-CM | POA: Diagnosis not present

## 2022-11-26 DIAGNOSIS — Z8249 Family history of ischemic heart disease and other diseases of the circulatory system: Secondary | ICD-10-CM | POA: Diagnosis not present

## 2022-11-26 DIAGNOSIS — G893 Neoplasm related pain (acute) (chronic): Secondary | ICD-10-CM | POA: Diagnosis not present

## 2022-11-26 DIAGNOSIS — Z809 Family history of malignant neoplasm, unspecified: Secondary | ICD-10-CM

## 2022-11-26 DIAGNOSIS — R530 Neoplastic (malignant) related fatigue: Secondary | ICD-10-CM | POA: Diagnosis present

## 2022-11-26 DIAGNOSIS — Z7982 Long term (current) use of aspirin: Secondary | ICD-10-CM | POA: Diagnosis not present

## 2022-11-26 DIAGNOSIS — I70223 Atherosclerosis of native arteries of extremities with rest pain, bilateral legs: Secondary | ICD-10-CM | POA: Diagnosis not present

## 2022-11-26 DIAGNOSIS — C779 Secondary and unspecified malignant neoplasm of lymph node, unspecified: Secondary | ICD-10-CM | POA: Diagnosis present

## 2022-11-26 DIAGNOSIS — K5900 Constipation, unspecified: Secondary | ICD-10-CM | POA: Diagnosis not present

## 2022-11-26 DIAGNOSIS — J910 Malignant pleural effusion: Secondary | ICD-10-CM | POA: Diagnosis present

## 2022-11-26 DIAGNOSIS — J90 Pleural effusion, not elsewhere classified: Secondary | ICD-10-CM | POA: Diagnosis not present

## 2022-11-26 DIAGNOSIS — I743 Embolism and thrombosis of arteries of the lower extremities: Secondary | ICD-10-CM | POA: Diagnosis present

## 2022-11-26 DIAGNOSIS — C3492 Malignant neoplasm of unspecified part of left bronchus or lung: Secondary | ICD-10-CM | POA: Diagnosis present

## 2022-11-26 DIAGNOSIS — E46 Unspecified protein-calorie malnutrition: Secondary | ICD-10-CM | POA: Diagnosis not present

## 2022-11-26 DIAGNOSIS — J9811 Atelectasis: Secondary | ICD-10-CM | POA: Diagnosis not present

## 2022-11-26 DIAGNOSIS — M79605 Pain in left leg: Secondary | ICD-10-CM | POA: Diagnosis not present

## 2022-11-26 DIAGNOSIS — I739 Peripheral vascular disease, unspecified: Secondary | ICD-10-CM | POA: Diagnosis not present

## 2022-11-26 DIAGNOSIS — D649 Anemia, unspecified: Secondary | ICD-10-CM | POA: Diagnosis present

## 2022-11-26 DIAGNOSIS — F419 Anxiety disorder, unspecified: Secondary | ICD-10-CM | POA: Diagnosis not present

## 2022-11-26 DIAGNOSIS — Z6820 Body mass index (BMI) 20.0-20.9, adult: Secondary | ICD-10-CM

## 2022-11-26 DIAGNOSIS — G4700 Insomnia, unspecified: Secondary | ICD-10-CM | POA: Diagnosis not present

## 2022-11-26 DIAGNOSIS — D72829 Elevated white blood cell count, unspecified: Secondary | ICD-10-CM | POA: Diagnosis present

## 2022-11-26 DIAGNOSIS — Z885 Allergy status to narcotic agent status: Secondary | ICD-10-CM

## 2022-11-26 DIAGNOSIS — C3432 Malignant neoplasm of lower lobe, left bronchus or lung: Secondary | ICD-10-CM | POA: Diagnosis not present

## 2022-11-26 DIAGNOSIS — Z79899 Other long term (current) drug therapy: Secondary | ICD-10-CM | POA: Diagnosis not present

## 2022-11-26 DIAGNOSIS — C3490 Malignant neoplasm of unspecified part of unspecified bronchus or lung: Secondary | ICD-10-CM | POA: Diagnosis not present

## 2022-11-26 DIAGNOSIS — C7951 Secondary malignant neoplasm of bone: Secondary | ICD-10-CM | POA: Diagnosis present

## 2022-11-26 DIAGNOSIS — R911 Solitary pulmonary nodule: Secondary | ICD-10-CM | POA: Diagnosis not present

## 2022-11-26 DIAGNOSIS — I70229 Atherosclerosis of native arteries of extremities with rest pain, unspecified extremity: Secondary | ICD-10-CM | POA: Diagnosis not present

## 2022-11-26 DIAGNOSIS — Z515 Encounter for palliative care: Secondary | ICD-10-CM | POA: Diagnosis not present

## 2022-11-26 DIAGNOSIS — I70202 Unspecified atherosclerosis of native arteries of extremities, left leg: Secondary | ICD-10-CM

## 2022-11-26 DIAGNOSIS — I513 Intracardiac thrombosis, not elsewhere classified: Secondary | ICD-10-CM | POA: Diagnosis not present

## 2022-11-26 LAB — HCG, SERUM, QUALITATIVE: Preg, Serum: NEGATIVE

## 2022-11-26 LAB — COMPREHENSIVE METABOLIC PANEL
ALT: 30 U/L (ref 0–44)
ALT: 35 U/L (ref 0–44)
AST: 21 U/L (ref 15–41)
AST: 27 U/L (ref 15–41)
Albumin: 3.1 g/dL — ABNORMAL LOW (ref 3.5–5.0)
Albumin: 2.5 g/dL — ABNORMAL LOW (ref 3.5–5.0)
Alkaline Phosphatase: 71 U/L (ref 38–126)
Alkaline Phosphatase: 65 U/L (ref 38–126)
Anion gap: 5 (ref 5–15)
Anion gap: 8 (ref 5–15)
BUN: 10 mg/dL (ref 6–20)
BUN: 16 mg/dL (ref 6–20)
CO2: 30 mmol/L (ref 22–32)
CO2: 25 mmol/L (ref 22–32)
Calcium: 8.6 mg/dL — ABNORMAL LOW (ref 8.9–10.3)
Calcium: 8.2 mg/dL — ABNORMAL LOW (ref 8.9–10.3)
Chloride: 100 mmol/L (ref 98–111)
Chloride: 99 mmol/L (ref 98–111)
Creatinine, Ser: 0.73 mg/dL (ref 0.44–1.00)
Creatinine, Ser: 0.82 mg/dL (ref 0.44–1.00)
GFR, Estimated: >60 mL/min (ref >60)
GFR, Estimated: >60 mL/min (ref >60)
Glucose, Bld: 89 mg/dL (ref 70–99)
Glucose, Bld: 99 mg/dL (ref 70–99)
Potassium: 4.3 mmol/L (ref 3.5–5.1)
Potassium: 4 mmol/L (ref 3.5–5.1)
Sodium: 135 mmol/L (ref 135–145)
Sodium: 132 mmol/L — ABNORMAL LOW (ref 135–145)
Total Bilirubin: 0.4 mg/dL (ref 0.3–1.2)
Total Bilirubin: 0.2 mg/dL — ABNORMAL LOW (ref 0.3–1.2)
Total Protein: 5.7 g/dL — ABNORMAL LOW (ref 6.5–8.1)
Total Protein: 5.3 g/dL — ABNORMAL LOW (ref 6.5–8.1)

## 2022-11-26 LAB — CBC WITH DIFFERENTIAL/PLATELET
Abs Immature Granulocytes: 0.41 10*3/uL — ABNORMAL HIGH (ref 0.00–0.07)
Basophils Absolute: 0.1 10*3/uL (ref 0.0–0.1)
Basophils Relative: 0 %
Eosinophils Absolute: 0.4 10*3/uL (ref 0.0–0.5)
Eosinophils Relative: 2 %
HCT: 36.7 % (ref 36.0–46.0)
Hemoglobin: 12.5 g/dL (ref 12.0–15.0)
Immature Granulocytes: 3 %
Lymphocytes Relative: 11 %
Lymphs Abs: 1.8 10*3/uL (ref 0.7–4.0)
MCH: 28.8 pg (ref 26.0–34.0)
MCHC: 34.1 g/dL (ref 30.0–36.0)
MCV: 84.6 fL (ref 80.0–100.0)
Monocytes Absolute: 1.3 10*3/uL — ABNORMAL HIGH (ref 0.1–1.0)
Monocytes Relative: 8 %
Neutro Abs: 12.7 10*3/uL — ABNORMAL HIGH (ref 1.7–7.7)
Neutrophils Relative %: 76 %
Platelets: 306 10*3/uL (ref 150–400)
RBC: 4.34 MIL/uL (ref 3.87–5.11)
RDW: 13.9 % (ref 11.5–15.5)
WBC: 16.5 10*3/uL — ABNORMAL HIGH (ref 4.0–10.5)
nRBC: 0 % (ref 0.0–0.2)

## 2022-11-26 LAB — CBC
HCT: 34.3 % — ABNORMAL LOW (ref 36.0–46.0)
Hemoglobin: 11.3 g/dL — ABNORMAL LOW (ref 12.0–15.0)
MCH: 28.3 pg (ref 26.0–34.0)
MCHC: 32.9 g/dL (ref 30.0–36.0)
MCV: 86 fL (ref 80.0–100.0)
Platelets: 293 10*3/uL (ref 150–400)
RBC: 3.99 MIL/uL (ref 3.87–5.11)
RDW: 13.8 % (ref 11.5–15.5)
WBC: 14.7 10*3/uL — ABNORMAL HIGH (ref 4.0–10.5)
nRBC: 0 % (ref 0.0–0.2)

## 2022-11-26 LAB — PROTIME-INR
INR: 1 (ref 0.8–1.2)
Prothrombin Time: 13 seconds (ref 11.4–15.2)

## 2022-11-26 LAB — URIC ACID: Uric Acid, Serum: 3.8 mg/dL (ref 2.5–7.1)

## 2022-11-26 MED ORDER — ONDANSETRON HCL 4 MG/2ML IJ SOLN
4.0000 mg | Freq: Four times a day (QID) | INTRAMUSCULAR | Status: DC | PRN
  Administered 2022-11-27 (×2): 4 mg via INTRAVENOUS
  Filled 2022-11-26 (×2): qty 2

## 2022-11-26 MED ORDER — HEPARIN BOLUS VIA INFUSION
4000.0000 [IU] | Freq: Once | INTRAVENOUS | Status: AC
  Administered 2022-11-26: 4000 [IU] via INTRAVENOUS
  Filled 2022-11-26: qty 4000

## 2022-11-26 MED ORDER — LABETALOL HCL 5 MG/ML IV SOLN: 10.0000 mg | INTRAVENOUS | Status: DC | PRN

## 2022-11-26 MED ORDER — HEPARIN (PORCINE) 25000 UT/250ML-% IV SOLN
1250.0000 [IU]/h | INTRAVENOUS | Status: DC
  Administered 2022-11-26: 1100 [IU]/h via INTRAVENOUS
  Filled 2022-11-26: qty 250

## 2022-11-26 MED ORDER — SODIUM CHLORIDE 0.9 % IV SOLN: 250.0000 mL | INTRAVENOUS | Status: DC | PRN

## 2022-11-26 MED ORDER — METOPROLOL TARTRATE 5 MG/5ML IV SOLN: 2.0000 mg | INTRAVENOUS | Status: DC | PRN

## 2022-11-26 MED ORDER — BISACODYL 10 MG RE SUPP: 10.0000 mg | Freq: Every day | RECTAL | Status: DC | PRN

## 2022-11-26 MED ORDER — MORPHINE SULFATE (PF) 2 MG/ML IV SOLN
2.0000 mg | INTRAVENOUS | Status: DC | PRN
  Administered 2022-11-27 (×2): 2 mg via INTRAVENOUS
  Filled 2022-11-26 (×2): qty 1

## 2022-11-26 MED ORDER — SODIUM CHLORIDE 0.9% FLUSH: 3.0000 mL | INTRAVENOUS | Status: DC | PRN

## 2022-11-26 MED ORDER — ACETAMINOPHEN 325 MG PO TABS
325.0000 mg | ORAL_TABLET | ORAL | Status: DC | PRN
  Administered 2022-11-26: 650 mg via ORAL
  Filled 2022-11-26: qty 2

## 2022-11-26 MED ORDER — POTASSIUM CHLORIDE CRYS ER 20 MEQ PO TBCR
20.0000 meq | EXTENDED_RELEASE_TABLET | Freq: Once | ORAL | Status: AC
  Administered 2022-11-26: 20 meq via ORAL
  Filled 2022-11-26: qty 1

## 2022-11-26 MED ORDER — PHENOL 1.4 % MT LIQD: 1.0000 | OROMUCOSAL | Status: DC | PRN

## 2022-11-26 MED ORDER — ALPRAZOLAM 0.5 MG PO TABS
0.5000 mg | ORAL_TABLET | Freq: Three times a day (TID) | ORAL | Status: DC | PRN
  Administered 2022-11-26: 0.5 mg via ORAL
  Filled 2022-11-26: qty 1

## 2022-11-26 MED ORDER — POLYETHYLENE GLYCOL 3350 17 G PO PACK: 17.0000 g | PACK | Freq: Every day | ORAL | Status: DC | PRN

## 2022-11-26 MED ORDER — SODIUM CHLORIDE 0.9 % IV SOLN: INTRAVENOUS | Status: DC

## 2022-11-26 MED ORDER — ALUM & MAG HYDROXIDE-SIMETH 200-200-20 MG/5ML PO SUSP: 15.0000 mL | ORAL | Status: DC | PRN

## 2022-11-26 MED ORDER — ACETAMINOPHEN 650 MG RE SUPP: 325.0000 mg | RECTAL | Status: DC | PRN

## 2022-11-26 MED ORDER — SODIUM CHLORIDE 0.9% FLUSH
3.0000 mL | Freq: Two times a day (BID) | INTRAVENOUS | Status: DC
  Administered 2022-11-26: 3 mL via INTRAVENOUS

## 2022-11-26 MED ORDER — OXYCODONE HCL 5 MG PO TABS
2.5000 mg | ORAL_TABLET | ORAL | Status: DC | PRN
  Administered 2022-11-27: 2.5 mg via ORAL
  Filled 2022-11-26 (×2): qty 1

## 2022-11-26 MED ORDER — GUAIFENESIN-DM 100-10 MG/5ML PO SYRP: 15.0000 mL | ORAL_SOLUTION | ORAL | Status: DC | PRN

## 2022-11-26 MED ORDER — HYDRALAZINE HCL 20 MG/ML IJ SOLN: 5.0000 mg | INTRAMUSCULAR | Status: DC | PRN

## 2022-11-26 MED ORDER — PANTOPRAZOLE SODIUM 40 MG PO TBEC
40.0000 mg | DELAYED_RELEASE_TABLET | Freq: Every day | ORAL | Status: DC
  Administered 2022-11-26 – 2022-11-27 (×2): 40 mg via ORAL
  Filled 2022-11-26 (×2): qty 1

## 2022-11-26 NOTE — Telephone Encounter (Signed)
Called patient regarding upcoming February appointments, patient is notified.

## 2022-11-26 NOTE — Progress Notes (Signed)
VASCULAR AND VEIN SPECIALISTS OF   ASSESSMENT / PLAN: 37 y.o. female with subacute bilateral lower extremity ischemia causing rest pain in the left lower extremity and claudication in the right lower extremity.  Given absence of risk factors, young age, and stage IV lung cancer, strongly suspect cardioembolic or thrombotic cause.   Plan direct admission to hospital.  Start heparin drip on arrival.  N.p.o. after midnight.  Plan angiography tomorrow.   CHIEF COMPLAINT: Severe leg pain  HISTORY OF PRESENT ILLNESS: Melissa Pineda is a 37 y.o. female who was urgently added into the clinic today for evaluation of ultrasound findings.  The patient has had an unusual course.  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  She is a never smoker.  She had malignant effusion and required a Pleurx catheter.  She reports worsening leg pain over the past several days.  Prior to this, she describes leg pain like shinsplints.  She reports severe pain about the metatarsals and toes on the left, classic for ischemic rest pain.  She reports cramping discomfort in the right leg, which is bearable to her.  VASCULAR SURGICAL HISTORY: none  VASCULAR RISK FACTORS: Negative history of stroke / transient ischemic attack. Negative history of coronary artery disease.  Negative history of diabetes mellitus.  Negative history of smoking. Negative history of hypertension.  Negative history of chronic kidney disease.  Negative history of chronic obstructive pulmonary disease.  FUNCTIONAL STATUS: ECOG performance status: (0) Fully active, able to carry on all predisease performance without restriction Ambulatory status: Ambulatory within the community without limits  Past Medical History:  Diagnosis Date   Anxiety    Phreesia 11/21/2020   Cancer [facility])     Past Surgical History:   Procedure Laterality Date   IR PERC PLEURAL DRAIN W/INDWELL CATH W/IMG GUIDE  11/09/2022   WISDOM TOOTH EXTRACTION      Family History  Problem Relation Age of Onset   Cancer Mother    Cancer Maternal Grandmother    Cancer Maternal Grandfather    Hypertension Paternal Grandmother    Cancer Paternal Grandfather     Social History   Socioeconomic History   Marital status: Married    Spouse name: Not on file   Number of children: Not on file   Years of education: Not on file   Highest education level: Not on file  Occupational History   Not on file  Tobacco Use   Smoking status: Never   Smokeless tobacco: Never  Vaping Use   Vaping Use: Never used  Substance and Sexual Activity   Alcohol use: Yes    Alcohol/week: 7.0 standard drinks of alcohol    Types: 7 Glasses of wine per week    Comment: 1 glass wine in the evening    Drug use: Yes    Types: Marijuana    Comment: occasional   Sexual activity: Yes    Birth control/protection: None  Other Topics Concern   Not on file  Social History Narrative   Not on file   Social Determinants of Health   Financial Resource Strain: Not on file  Food Insecurity: No Food Insecurity (11/04/2022)   Hunger Vital Sign    Worried About Running Out of Food in the Last Year: Never true    Ran Out of Food in the Last Year: Never true  Transportation Needs: No  Transportation Needs (11/04/2022)   PRAPARE - Hydrologist (Medical): No    Lack of Transportation (Non-Medical): No  Physical Activity: Not on file  Stress: Not on file  Social Connections: Not on file  Intimate Partner Violence: Not At Risk (11/04/2022)   Humiliation, Afraid, Rape, and Kick questionnaire    Fear of Current or Ex-Partner: No    Emotionally Abused: No    Physically Abused: No    Sexually Abused: No    Allergies  Allergen Reactions   Hydrocodone Nausea Only    Dizzy, "crawl out of my skin"   Tramadol Other (See Comments)     "Weird feeling"    Current Outpatient Medications  Medication Sig Dispense Refill   ALPRAZolam (XANAX) 0.5 MG tablet Take 1 tablet (0.5 mg total) by mouth 3 (three) times daily as needed for anxiety. 60 tablet 0   Ascorbic Acid (VITAMIN C) 1000 MG tablet Take 1,000 mg by mouth daily.     b complex vitamins capsule Take 1 capsule by mouth daily.     benzonatate (TESSALON) 200 MG capsule Take 1 capsule (200 mg total) by mouth 4 (four) times daily as needed for cough. 120 capsule 2   cholecalciferol (VITAMIN D3) 25 MCG (1000 UNIT) tablet Take 5,000 Units by mouth daily.     dexamethasone (DECADRON) 6 MG tablet Take 1 tablet (6 mg total) by mouth every morning. Take 1/2 (3mg ) tablet on 1/23. Then take 1 tablet (6mg ) every AM starting on 1/24 or as directed. 10 tablet 0   folic acid (FOLVITE) 1 MG tablet Take 1 tablet (1 mg total) by mouth daily. 30 tablet 4   gabapentin (NEURONTIN) 100 MG capsule Take 100 mg by mouth 2 (two) times daily.  Start 100 mg twice a day x 3 days, if tolerating, increase to 200 mg twice a day. Take 2 capsules (200 mg total) by mouth 2 (two) times daily     levonorgestrel (MIRENA, 52 MG,) 20 MCG/DAY IUD 1 each by Intrauterine route once.     Magnesium Oxide -Mg Supplement 400 MG CAPS Take 400 mg by mouth 2 (two) times daily. 60 capsule 0   metoCLOPramide (REGLAN) 5 MG tablet Take 1 tablet (5 mg total) by mouth every 6 (six) hours as needed for nausea. 30 tablet 1   niacin (,VITAMIN B3,) 500 MG tablet Take 500 mg by mouth at bedtime.     ondansetron (ZOFRAN) 8 MG tablet Take 1 tablet (8 mg total) by mouth every 8 (eight) hours as needed for nausea or vomiting. 20 tablet 0   ondansetron (ZOFRAN-ODT) 8 MG disintegrating tablet Take 1 tablet (8 mg total) by mouth every 8 (eight) hours as needed for nausea or vomiting. 20 tablet 1   Ondansetron 4 MG FILM Take 4-8 mg by mouth every 6 (six) hours as needed. 15 each 0   oxyCODONE (ROXICODONE) 5 MG immediate release tablet Take 1  tablet (5 mg total) by mouth every 4 (four) hours as needed for severe pain. 120 tablet 0   Repotrectinib 40 MG CAPS Take 160 mg by mouth daily. 56 capsule 0   scopolamine (TRANSDERM-SCOP) 1 MG/3DAYS Place 1 patch (1.5 mg total) onto the skin every 3 (three) days. 3 patch 0   Turmeric (QC TUMERIC COMPLEX PO) Take 500 mg by mouth daily.     zinc sulfate 220 (50 Zn) MG capsule Take 220 mg by mouth daily.     Current Facility-Administered Medications  Medication Dose Route Frequency Provider Last Rate Last Admin   famotidine (PEPCID) tablet 20 mg  20 mg Oral Once Lane Hacker L, DO        PHYSICAL EXAM Vitals:   11/26/22 1304  BP: 121/86  Pulse: (!) 101  Resp: 20  Temp: 98.4 F (36.9 C)  SpO2: 100%  Weight: 130 lb (59 kg)  Height: 5\' 6"  (1.676 m)   Young woman.  Anxious. Mild tachycardia Regular rate and rhythm Left foot cool.  Right foot cool No palpable pedal pulses.    PERTINENT LABORATORY AND RADIOLOGIC DATA  Most recent CBC    Latest Ref Rng & Units 11/26/2022   11:58 AM 11/20/2022    9:06 AM 11/12/2022    7:50 AM  CBC  WBC 4.0 - 10.5 K/uL 16.5  67.8  17.2   Hemoglobin 12.0 - 15.0 g/dL 12.5  15.4  14.3   Hematocrit 36.0 - 46.0 % 36.7  44.7  42.2   Platelets 150 - 400 K/uL 306  142  545      Most recent CMP    Latest Ref Rng & Units 11/26/2022   11:58 AM 11/20/2022    9:06 AM 11/12/2022    7:50 AM  CMP  Glucose 70 - 99 mg/dL 89  145  109   BUN 6 - 20 mg/dL 10  15  5    Creatinine 0.44 - 1.00 mg/dL 0.73  0.90  0.64   Sodium 135 - 145 mmol/L 135  130  135   Potassium 3.5 - 5.1 mmol/L 4.3  5.3  3.7   Chloride 98 - 111 mmol/L 100  94  101   CO2 22 - 32 mmol/L 30  28  27    Calcium 8.9 - 10.3 mg/dL 8.6  8.4  8.3   Total Protein 6.5 - 8.1 g/dL 5.7  5.7  5.5   Total Bilirubin 0.3 - 1.2 mg/dL 0.4  0.6  0.4   Alkaline Phos 38 - 126 U/L 71  72  61   AST 15 - 41 U/L 21  14  14    ALT 0 - 44 U/L 30  46  15     Renal function Estimated Creatinine Clearance: 90.5  mL/min (by C-G formula based on SCr of 0.73 mg/dL).  Hgb A1c MFr Bld (%)  Date Value  11/24/2020 5.0    LDL Chol Calc (NIH)  Date Value Ref Range Status  11/24/2020 89 0 - 99 mg/dL Final     Vascular Imaging: Arterial duplex reviewed.  Left profunda occlusion. Popliteal occlusions noted bilaterally. Tibial occlusions bilaterally. Toe PPG noted on the left (more symptomatic side). Absent toe PPG on the right (less symptomatic side)  Yevonne Aline. Stanford Breed, MD [facility] Vascular and Vein Specialists of [facility] Phone Number: [phone] 11/26/2022 6:22 PM   Total time spent on preparing this encounter including chart review, data review, collecting history, examining the patient, coordinating care for this new patient, 60 minutes.  Portions of this report may have been transcribed using voice recognition software.  Every effort has been made to ensure accuracy; however, inadvertent computerized transcription errors may still be present.

## 2022-11-26 NOTE — Progress Notes (Unsigned)
cx

## 2022-11-26 NOTE — H&P (Signed)
Seen patient this evening after admission to hospital.   She has motor and sensory function in bilateral feet.  She has Doppler flow in posterior tibial arteries bilaterally. See my note from earlier today for full H&P details. Reviewed plan for tomorrow.  Melissa Pineda. Stanford Breed, MD [facility] Vascular and Vein Specialists of [facility] Phone Number: [phone] 11/26/2022 9:59 PM

## 2022-11-26 NOTE — Telephone Encounter (Signed)
Pt stated she will be admitted to [facility] tonight for more vascular diagnostic testing .Marland Kitchen Pt wants assurance that Dr Julien Nordmann and Dr Luan Pulling discussed her case. I told her that I suspected they did because a referral was made.  She received her Repotrectinib today and she will get it tomorrow in the hospital .

## 2022-11-26 NOTE — Progress Notes (Signed)
Lower extremity venous bilateral study completed. Results were discussed with Melissa Nordmann, MD. Orders placed for lower extremity arterial duplex bilateral study given incidental findings. Arterial duplex performed. Patient referred to [facility] for vascular consult today.   Darlin Coco, RDMS, RVT

## 2022-11-26 NOTE — Telephone Encounter (Signed)
Oral Chemotherapy Pharmacist Encounter   Spoke with patient's mother today to follow up regarding patient's oral chemotherapy medication: Augtyro (reprotrectinib)  30 day free trial offer of Augtyro is out for delivery today (tracking number through [place]: [id number]). Patient's mother aware patient to start this 11/27/22.  Plan for Augtyro is for patient to take 4 capsules, (160 mg total) by mouth daily for 14 days, then if tolerated increase to 4 capsules (160 mg total) by mouth 2 (two) times daily with or without food.  Patient has follow up scheduled at [facility] on  12/11/22 (day 15 of Augtyro) where patient will be assessed if OK to increase to the maintenance dose of 4 capsules (160 mg total) by mouth 2 (two) times daily.   Similar to the entrectinib, patient will need to avoid grapefruit and grapefruit juice while on Augtyro.   Side effects include, but are not limited to: CNS disturbances, hyperuricemia, changes in liver function, constipation, dysguesia, myalgias/increased CPK  Full manufacturer assistance approval through [employer] is still pending, and oral chemotherapy clinic will continue to follow the status of this.  Patient's mother expressed appreciation and understanding. No further questions at this time. She has the phone number to the oral chemotherapy clinic for any questions.   Leron Croak, PharmD, BCPS, [facility] Hematology/Oncology Clinical Pharmacist [facility] [place] [phone] 11/26/2022 3:16 PM

## 2022-11-26 NOTE — Progress Notes (Signed)
ANTICOAGULATION CONSULT NOTE - Initial Consult  Pharmacy Consult for heparin Indication: bilateral lower extremity ischemia awaiting angiography  Allergies  Allergen Reactions   Hydrocodone Nausea Only    Dizzy, "crawl out of my skin"   Tramadol Other (See Comments)    "Weird feeling"    Patient Measurements: Height: 5\' 6"  (167.6 cm) Weight: 59 kg (130 lb 1.1 oz) IBW/kg (Calculated) : 59.3  Vital Signs: Temp: 98.4 F (36.9 C) (02/06 1304) BP: 121/86 (02/06 1304) Pulse Rate: 101 (02/06 1304)  Labs: Recent Labs    11/26/22 1158  HGB 12.5  HCT 36.7  PLT 306  CREATININE 0.73    Estimated Creatinine Clearance: 90.5 mL/min (by C-G formula based on SCr of 0.73 mg/dL).   Medical History: Past Medical History:  Diagnosis Date   Anxiety    Phreesia 11/21/2020   Cancer ([place])     Medications:  Facility-Administered Medications Prior to Admission  Medication Dose Route Frequency Provider Last Rate Last Admin   famotidine (PEPCID) tablet 20 mg  20 mg Oral Once Acquanetta Chain, DO       Medications Prior to Admission  Medication Sig Dispense Refill Last Dose   ALPRAZolam (XANAX) 0.5 MG tablet Take 1 tablet (0.5 mg total) by mouth 3 (three) times daily as needed for anxiety. 60 tablet 0    Ascorbic Acid (VITAMIN C) 1000 MG tablet Take 1,000 mg by mouth daily.      b complex vitamins capsule Take 1 capsule by mouth daily.      benzonatate (TESSALON) 200 MG capsule Take 1 capsule (200 mg total) by mouth 4 (four) times daily as needed for cough. 120 capsule 2    cholecalciferol (VITAMIN D3) 25 MCG (1000 UNIT) tablet Take 5,000 Units by mouth daily.      dexamethasone (DECADRON) 6 MG tablet Take 1 tablet (6 mg total) by mouth every morning. Take 1/2 (3mg ) tablet on 1/23. Then take 1 tablet (6mg ) every AM starting on 1/24 or as directed. 10 tablet 0    folic acid (FOLVITE) 1 MG tablet Take 1 tablet (1 mg total) by mouth daily. 30 tablet 4    gabapentin (NEURONTIN) 100 MG  capsule Take 100 mg by mouth 2 (two) times daily.  Start 100 mg twice a day x 3 days, if tolerating, increase to 200 mg twice a day. Take 2 capsules (200 mg total) by mouth 2 (two) times daily      levonorgestrel (MIRENA, 52 MG,) 20 MCG/DAY IUD 1 each by Intrauterine route once.      Magnesium Oxide -Mg Supplement 400 MG CAPS Take 400 mg by mouth 2 (two) times daily. 60 capsule 0    metoCLOPramide (REGLAN) 5 MG tablet Take 1 tablet (5 mg total) by mouth every 6 (six) hours as needed for nausea. 30 tablet 1    niacin (,VITAMIN B3,) 500 MG tablet Take 500 mg by mouth at bedtime.      ondansetron (ZOFRAN) 8 MG tablet Take 1 tablet (8 mg total) by mouth every 8 (eight) hours as needed for nausea or vomiting. 20 tablet 0    ondansetron (ZOFRAN-ODT) 8 MG disintegrating tablet Take 1 tablet (8 mg total) by mouth every 8 (eight) hours as needed for nausea or vomiting. 20 tablet 1    Ondansetron 4 MG FILM Take 4-8 mg by mouth every 6 (six) hours as needed. 15 each 0    oxyCODONE (ROXICODONE) 5 MG immediate release tablet Take 1 tablet (5 mg total)  by mouth every 4 (four) hours as needed for severe pain. 120 tablet 0    Repotrectinib 40 MG CAPS Take 160 mg by mouth daily. 56 capsule 0    scopolamine (TRANSDERM-SCOP) 1 MG/3DAYS Place 1 patch (1.5 mg total) onto the skin every 3 (three) days. 3 patch 0    Turmeric (QC TUMERIC COMPLEX PO) Take 500 mg by mouth daily.      zinc sulfate 220 (50 Zn) MG capsule Take 220 mg by mouth daily.      Scheduled:   pantoprazole  40 mg Oral Daily   potassium chloride  20-40 mEq Oral Once   sodium chloride flush  3 mL Intravenous Q12H   Infusions:   sodium chloride     sodium chloride      Assessment: 37yo female admitted by vascular surgery for LE ischemia causing pain at rest in LLE and claudication in RLE, has stage IV lung Ca without other risk factors so suspected to be cardioembolic or thrombotic, now awaiting angiography >> to begin heparin.  Goal of Therapy:   Heparin level 0.3-0.7 units/ml Monitor platelets by anticoagulation protocol: Yes   Plan:  Heparin 4000 units IV bolus x1 followed by infusion at 1100 units/hr. Monitor heparin levels and CBC.  Wynona Neat, PharmD, BCPS  11/26/2022,7:36 PM

## 2022-11-26 NOTE — Progress Notes (Signed)
   11/26/22 1940  Vitals  Temp 98.2 F (36.8 C)  Temp Source Oral  BP 126/89  MAP (mmHg) 100  BP Location Right Arm  BP Method Automatic  Patient Position (if appropriate) Sitting  Pulse Rate (!) 109  Pulse Rate Source Monitor  Resp 18  Level of Consciousness  Level of Consciousness Alert  MEWS COLOR  MEWS Score Color Green  Oxygen Therapy  SpO2 99 %  O2 Device Room Air  PCA/Epidural/Spinal Assessment  Respiratory Pattern Regular;Unlabored  MEWS Score  MEWS Temp 0  MEWS Systolic 0  MEWS Pulse 1  MEWS RR 0  MEWS LOC 0  MEWS Score 1   Pt admitted to [id number]. Pt oriented to unit. CCMD notified. CHG bath given. Call light within reach. Pt made comfortable. Pt's family at bedside.

## 2022-11-27 ENCOUNTER — Ambulatory Visit (HOSPITAL_COMMUNITY)
Admission: RE | Admit: 2022-11-27 | Payer: BC Managed Care – PPO | Source: Home / Self Care | Admitting: Vascular Surgery

## 2022-11-27 ENCOUNTER — Inpatient Hospital Stay (HOSPITAL_COMMUNITY)
Disposition: A | Discharge status: Home / Self Care | Payer: Self-pay | Source: Ambulatory Visit | Attending: Vascular Surgery

## 2022-11-27 ENCOUNTER — Encounter (HOSPITAL_COMMUNITY): Payer: Self-pay | Admitting: Anesthesiology

## 2022-11-27 ENCOUNTER — Telehealth: Payer: Self-pay | Admitting: Medical Oncology

## 2022-11-27 ENCOUNTER — Inpatient Hospital Stay (HOSPITAL_COMMUNITY): Payer: BC Managed Care – PPO

## 2022-11-27 DIAGNOSIS — I70229 Atherosclerosis of native arteries of extremities with rest pain, unspecified extremity: Secondary | ICD-10-CM

## 2022-11-27 DIAGNOSIS — G893 Neoplasm related pain (acute) (chronic): Secondary | ICD-10-CM | POA: Diagnosis present

## 2022-11-27 DIAGNOSIS — I739 Peripheral vascular disease, unspecified: Secondary | ICD-10-CM | POA: Diagnosis not present

## 2022-11-27 DIAGNOSIS — I743 Embolism and thrombosis of arteries of the lower extremities: Secondary | ICD-10-CM

## 2022-11-27 HISTORY — PX: PERIPHERAL VASCULAR THROMBECTOMY: CATH118306

## 2022-11-27 HISTORY — PX: ABDOMINAL AORTOGRAM W/LOWER EXTREMITY: CATH118223

## 2022-11-27 HISTORY — PX: PERIPHERAL VASCULAR ATHERECTOMY: CATH118256

## 2022-11-27 LAB — URINALYSIS, ROUTINE W REFLEX MICROSCOPIC
Bilirubin Urine: NEGATIVE
Glucose, UA: NEGATIVE mg/dL
Hgb urine dipstick: NEGATIVE
Ketones, ur: NEGATIVE mg/dL
Leukocytes,Ua: NEGATIVE
Nitrite: NEGATIVE
Protein, ur: NEGATIVE mg/dL
Specific Gravity, Urine: 1.005 (ref 1.005–1.030)
pH: 6 (ref 5.0–8.0)

## 2022-11-27 LAB — ECHOCARDIOGRAM COMPLETE
AR max vel: 2.76 cm2
AV Area VTI: 2.83 cm2
AV Area mean vel: 2.74 cm2
AV Mean grad: 3 mmHg
AV Peak grad: 4.8 mmHg
Ao pk vel: 1.1 m/s
S' Lateral: 3.2 cm

## 2022-11-27 LAB — HEPARIN LEVEL (UNFRACTIONATED)
Heparin Unfractionated: 0.1 IU/mL — ABNORMAL LOW (ref 0.30–0.70)
Heparin Unfractionated: 0.26 IU/mL — ABNORMAL LOW (ref 0.30–0.70)
Heparin Unfractionated: 0.25 IU/mL — ABNORMAL LOW (ref 0.30–0.70)

## 2022-11-27 LAB — SURGICAL PCR SCREEN
MRSA, PCR: NEGATIVE
Staphylococcus aureus: NEGATIVE

## 2022-11-27 SURGERY — ABDOMINAL AORTOGRAM W/LOWER EXTREMITY
Anesthesia: LOCAL

## 2022-11-27 MED ORDER — LABETALOL HCL 5 MG/ML IV SOLN: 10.0000 mg | INTRAVENOUS | Status: DC | PRN

## 2022-11-27 MED ORDER — MIDAZOLAM HCL 2 MG/2ML IJ SOLN
INTRAMUSCULAR | Status: DC | PRN
  Administered 2022-11-27: 1 mg via INTRAVENOUS

## 2022-11-27 MED ORDER — FENTANYL CITRATE (PF) 100 MCG/2ML IJ SOLN
INTRAMUSCULAR | Status: DC | PRN
  Administered 2022-11-27: 50 ug via INTRAVENOUS
  Administered 2022-11-27 (×2): 25 ug via INTRAVENOUS

## 2022-11-27 MED ORDER — LIDOCAINE HCL (PF) 1 % IJ SOLN
INTRAMUSCULAR | Status: DC | PRN
  Administered 2022-11-27: 20 mL

## 2022-11-27 MED ORDER — HEPARIN (PORCINE) IN NACL 1000-0.9 UT/500ML-% IV SOLN
INTRAVENOUS | Status: DC | PRN
  Administered 2022-11-27 (×2): 500 mL

## 2022-11-27 MED ORDER — HEPARIN BOLUS VIA INFUSION
750.0000 [IU] | Freq: Once | INTRAVENOUS | Status: AC
  Administered 2022-11-27: 750 [IU] via INTRAVENOUS
  Filled 2022-11-27: qty 750

## 2022-11-27 MED ORDER — HEPARIN SODIUM (PORCINE) 1000 UNIT/ML IJ SOLN
INTRAMUSCULAR | Status: DC | PRN
  Administered 2022-11-27: 2000 [IU] via INTRAVENOUS
  Administered 2022-11-27: 6000 [IU] via INTRAVENOUS

## 2022-11-27 MED ORDER — HEPARIN (PORCINE) IN NACL 1000-0.9 UT/500ML-% IV SOLN
INTRAVENOUS | Status: AC
  Filled 2022-11-27: qty 1000

## 2022-11-27 MED ORDER — DIAZEPAM 5 MG/ML IJ SOLN
2.5000 mg | INTRAMUSCULAR | Status: DC | PRN
  Administered 2022-11-27: 2.5 mg via INTRAVENOUS
  Filled 2022-11-27 (×2): qty 2

## 2022-11-27 MED ORDER — ACETAMINOPHEN 325 MG PO TABS
650.0000 mg | ORAL_TABLET | ORAL | Status: DC | PRN
  Administered 2022-11-28 (×3): 650 mg via ORAL
  Filled 2022-11-27 (×3): qty 2

## 2022-11-27 MED ORDER — SODIUM CHLORIDE 0.9% FLUSH
3.0000 mL | Freq: Two times a day (BID) | INTRAVENOUS | Status: DC
  Administered 2022-11-27: 3 mL via INTRAVENOUS

## 2022-11-27 MED ORDER — FENTANYL CITRATE (PF) 100 MCG/2ML IJ SOLN
INTRAMUSCULAR | Status: AC
  Filled 2022-11-27: qty 2

## 2022-11-27 MED ORDER — MIDAZOLAM HCL 5 MG/5ML IJ SOLN
INTRAMUSCULAR | Status: AC
  Filled 2022-11-27: qty 5

## 2022-11-27 MED ORDER — ONDANSETRON HCL 4 MG/2ML IJ SOLN
4.0000 mg | Freq: Four times a day (QID) | INTRAMUSCULAR | Status: DC | PRN
  Administered 2022-11-27 – 2022-11-28 (×2): 4 mg via INTRAVENOUS
  Filled 2022-11-27 (×2): qty 2

## 2022-11-27 MED ORDER — REPOTRECTINIB 40 MG PO CAPS
160.0000 mg | ORAL_CAPSULE | Freq: Every day | ORAL | Status: DC
  Administered 2022-11-27 – 2022-11-29 (×3): 160 mg via ORAL

## 2022-11-27 MED ORDER — HEPARIN BOLUS VIA INFUSION
2500.0000 [IU] | Freq: Once | INTRAVENOUS | Status: AC
  Administered 2022-11-27: 2500 [IU] via INTRAVENOUS
  Filled 2022-11-27: qty 2500

## 2022-11-27 MED ORDER — MAGNESIUM OXIDE -MG SUPPLEMENT 400 (240 MG) MG PO TABS
400.0000 mg | ORAL_TABLET | Freq: Two times a day (BID) | ORAL | Status: DC
  Administered 2022-11-28 – 2022-11-29 (×4): 400 mg via ORAL
  Filled 2022-11-27 (×4): qty 1

## 2022-11-27 MED ORDER — SODIUM CHLORIDE 0.9% FLUSH: 3.0000 mL | INTRAVENOUS | Status: DC | PRN

## 2022-11-27 MED ORDER — OXYCODONE HCL 5 MG PO TABS
10.0000 mg | ORAL_TABLET | ORAL | Status: DC | PRN
  Administered 2022-11-27: 10 mg via ORAL
  Filled 2022-11-27: qty 2

## 2022-11-27 MED ORDER — LIDOCAINE HCL (PF) 1 % IJ SOLN
INTRAMUSCULAR | Status: AC
  Filled 2022-11-27: qty 30

## 2022-11-27 MED ORDER — SODIUM CHLORIDE 0.9 % WEIGHT BASED INFUSION
1.0000 mL/kg/h | INTRAVENOUS | Status: AC
  Administered 2022-11-27: 1 mL/kg/h via INTRAVENOUS

## 2022-11-27 MED ORDER — ASPIRIN 81 MG PO TBEC
81.0000 mg | DELAYED_RELEASE_TABLET | Freq: Every day | ORAL | Status: DC
  Administered 2022-11-27 – 2022-11-29 (×3): 81 mg via ORAL
  Filled 2022-11-27 (×3): qty 1

## 2022-11-27 MED ORDER — HEPARIN (PORCINE) 25000 UT/250ML-% IV SOLN
1400.0000 [IU]/h | INTRAVENOUS | Status: AC
  Administered 2022-11-27: 1350 [IU]/h via INTRAVENOUS
  Administered 2022-11-28: 1400 [IU]/h via INTRAVENOUS
  Filled 2022-11-27 (×2): qty 250

## 2022-11-27 MED ORDER — SODIUM CHLORIDE 0.9 % IV SOLN: 250.0000 mL | INTRAVENOUS | Status: DC | PRN

## 2022-11-27 MED ORDER — SENNOSIDES-DOCUSATE SODIUM 8.6-50 MG PO TABS
2.0000 | ORAL_TABLET | Freq: Every day | ORAL | Status: DC
  Administered 2022-11-27 – 2022-11-28 (×2): 2 via ORAL
  Filled 2022-11-27 (×2): qty 2

## 2022-11-27 MED ORDER — HYDROMORPHONE HCL 1 MG/ML IJ SOLN
0.5000 mg | INTRAMUSCULAR | Status: DC | PRN
  Administered 2022-11-27 – 2022-11-28 (×4): 0.5 mg via INTRAVENOUS
  Filled 2022-11-27 (×4): qty 0.5

## 2022-11-27 MED ORDER — HYDRALAZINE HCL 20 MG/ML IJ SOLN: 5.0000 mg | INTRAMUSCULAR | Status: DC | PRN

## 2022-11-27 SURGICAL SUPPLY — 30 items
BALLN STERLING OTW 3X220X150 (BALLOONS) ×2
BALLN STERLING SL OTW 3X80X150 (BALLOONS) ×2
BALLOON STERLING OTW 3X220X150 (BALLOONS) IMPLANT
BALLOON STRLNG SL OTW 3X80X150 (BALLOONS) IMPLANT
CATH ANGIO 5F PIGTAIL 65CM (CATHETERS) IMPLANT
CATH AURYON ATHERECTOMY 1.5 (CATHETERS) IMPLANT
CATH CROSS OVER TEMPO 5F (CATHETERS) IMPLANT
CATH CXI SUPP 2.6F 150 ANG (CATHETERS) IMPLANT
CATH JETI 6FR 120 (CATHETERS) IMPLANT
CATH JETI 8FR 100 (CATHETERS) IMPLANT
CATH TEMPO AQUA 5F 100CM (CATHETERS) IMPLANT
CLOSURE PERCLOSE PROSTYLE (VASCULAR PRODUCTS) IMPLANT
GLIDEWIRE ADV .035X260CM (WIRE) IMPLANT
KIT ACCESSORY JETI (KITS) IMPLANT
KIT ENCORE 26 ADVANTAGE (KITS) IMPLANT
KIT PV (KITS) ×2 IMPLANT
SHEATH CATAPULT 7F 45 MP (SHEATH) IMPLANT
SHEATH CATAPULT 8F 45 ST (SHEATH) IMPLANT
SHEATH PINNACLE 5F 10CM (SHEATH) IMPLANT
SHEATH PINNACLE 6F 10CM (SHEATH) IMPLANT
SHEATH PROBE COVER 6X72 (BAG) IMPLANT
STOPCOCK MORSE 400PSI 3WAY (MISCELLANEOUS) IMPLANT
SYR MEDRAD MARK 7 150ML (SYRINGE) ×2 IMPLANT
TRANSDUCER W/STOPCOCK (MISCELLANEOUS) ×2 IMPLANT
TRAY PV CATH (CUSTOM PROCEDURE TRAY) ×2 IMPLANT
TUBING CIL FLEX 10 FLL-RA (TUBING) IMPLANT
WIRE BENTSON .035X145CM (WIRE) IMPLANT
WIRE G V18X300CM (WIRE) IMPLANT
WIRE MICRO SET SILHO 5FR 7 (SHEATH) IMPLANT
WIRE SPARTACORE .014X300CM (WIRE) IMPLANT

## 2022-11-27 NOTE — Progress Notes (Signed)
VASCULAR AND VEIN SPECIALISTS OF [place] PROGRESS NOTE  ASSESSMENT / PLAN: Melissa Pineda is a 37 y.o. female with subacute thrombosis of left profunda femoris and popliteal artery causing ischemic rest pain. Plan angiography today.   SUBJECTIVE: Pain continues in left foot.   OBJECTIVE: BP 107/68   Pulse 92   Temp 97.8 F (36.6 C) (Oral)   Resp 19   Ht 5\' 6"  (1.676 m)   Wt 59 kg   LMP 10/14/2022 (Exact Date) Comment: Mirena  SpO2 100%   BMI 20.99 kg/m   Intake/Output Summary (Last 24 hours) at 11/27/2022 1301 Last data filed at 11/27/2022 0200 Gross per 24 hour  Intake --  Output 400 ml  Net -400 ml    Anxious Regular rate and rhythm Unlabored breathing Unchanged appearance of bilateral feet     Latest Ref Rng & Units 11/26/2022    8:24 PM 11/26/2022   11:58 AM 11/20/2022    9:06 AM  CBC  WBC 4.0 - 10.5 K/uL 14.7  16.5  67.8   Hemoglobin 12.0 - 15.0 g/dL 11.3  12.5  15.4   Hematocrit 36.0 - 46.0 % 34.3  36.7  44.7   Platelets 150 - 400 K/uL 293  306  142         Latest Ref Rng & Units 11/26/2022    8:24 PM 11/26/2022   11:58 AM 11/20/2022    9:06 AM  CMP  Glucose 70 - 99 mg/dL 99  89  145   BUN 6 - 20 mg/dL 16  10  15    Creatinine 0.44 - 1.00 mg/dL 0.82  0.73  0.90   Sodium 135 - 145 mmol/L 132  135  130   Potassium 3.5 - 5.1 mmol/L 4.0  4.3  5.3   Chloride 98 - 111 mmol/L 99  100  94   CO2 22 - 32 mmol/L 25  30  28    Calcium 8.9 - 10.3 mg/dL 8.2  8.6  8.4   Total Protein 6.5 - 8.1 g/dL 5.3  5.7  5.7   Total Bilirubin 0.3 - 1.2 mg/dL 0.2  0.4  0.6   Alkaline Phos 38 - 126 U/L 65  71  72   AST 15 - 41 U/L 27  21  14    ALT 0 - 44 U/L 35  30  46     Estimated Creatinine Clearance: 88.3 mL/min (by C-G formula based on SCr of 0.82 mg/dL).  Yevonne Aline. Stanford Breed, MD [facility] Vascular and Vein Specialists of [facility] Phone Number: [phone] 11/27/2022 1:01 PM

## 2022-11-27 NOTE — Op Note (Signed)
DATE OF SERVICE: 11/27/2022  PATIENT:  Melissa Pineda  37 y.o. female  PRE-OPERATIVE DIAGNOSIS: subacute thrombosis of left profunda and popliteal arteries causing ischemic rest pain  POST-OPERATIVE DIAGNOSIS:  Same  PROCEDURE:   1) Ultrasound guided right common femoral artery access 2) Aortogram 3) left lower extremity angiogram with third order cannulation  4) mechanical thrombectomy of left profunda femoris artery (8 [place] JETi) 5) mechanical thrombectomy of left popliteal artery  (6 [place] JETi) 6) laser atherectomy and angioplasty (3 x 256mm) of left anterior tibial artery 7) Conscious sedation (129 minutes)   SURGEON:  Yevonne Aline. Stanford Breed, MD  ASSISTANT: none  ANESTHESIA:   local and IV sedation  ESTIMATED BLOOD LOSS: 722mL  LOCAL MEDICATIONS USED:  LIDOCAINE   COUNTS: confirmed correct.  PATIENT DISPOSITION:  PACU - hemodynamically stable.   Delay start of Pharmacological VTE agent (>24hrs) due to surgical blood loss or risk of bleeding: no  INDICATION FOR PROCEDURE: Melissa Pineda is a 37 y.o. female with occlusion of left profunda femoris and popliteal arteries causing ischemic rest pain.  I felt this was likely subacute given the weeks long onset of symptoms.  Preoperative duplex suggested left profunda femoris and popliteal artery occlusions. After careful discussion of risks, benefits, and alternatives the patient was offered angiography with possible intervention. The patient understood and wished to proceed.  OPERATIVE FINDINGS:  Terminal aorta and iliac arteries: Widely patent without flow-limiting stenosis  Left lower extremity: Common femoral artery: Healthy and widely patent Profunda femoris artery: Occluded at its origin.  Reconstitution at second-order branching in the mid thigh. Superficial femoral artery: Healthy and widely patent Popliteal artery: Above-knee segment healthy and widely patent.  Artery abruptly occludes behind the knee.  Robust  collateralization around the popliteal occlusion suggesting chronicity to the occlusion.   Anterior tibial artery: Reconstitutes several centimeters distal to its origin from geniculate collaterals Tibioperoneal trunk: Occluded Peroneal artery:  Reconstitutes several centimeters distal to its origin from geniculate collaterals Posterior tibial artery:  Reconstitutes several centimeters distal to its origin from geniculate collaterals. Pedal circulation: Fills via collaterals  DESCRIPTION OF PROCEDURE: After identification of the patient in the pre-operative holding area, the patient was transferred to the operating room. The patient was positioned supine on the operating room table.  Anesthesia was induced. The groins was prepped and draped in standard fashion. A surgical pause was performed confirming correct patient, procedure, and operative location.  The right groin was anesthetized with subcutaneous injection of 1% lidocaine. Using ultrasound guidance, the right common femoral artery was accessed with micropuncture technique. Fluoroscopy was used to confirm cannulation over the femoral head. The 68F sheath was upsized to 78F.   A Benson wire was advanced into the distal aorta. Over the wire an omni flush catheter was advanced to the level of L2. Aortogram was performed - see above for details.  The left common iliac artery was selected with an omniflush catheter and Glidewire advantage guidewire. The wire was advanced into the common femoral artery. Over the wire the omni flush catheter was advanced into the external iliac artery. Selective angiography was performed - see above for details.   The decision was made to intervene. The patient was heparinized with 7000 units of heparin. The 78F sheath was exchanged for a 8 [place] by 45 cm sheath. Selective angiography of the left lower extremity was performed prior to intervention.   The lesions were treated with: mechanical thrombectomy of left  profunda femoris artery (8 [place] JETi) mechanical thrombectomy of  left popliteal artery  (6 [place] JETi) laser atherectomy and angioplasty (3 x 270mm) of left anterior tibial artery  Completion angiography revealed:  Profunda femoris thrombosis resolved Popliteal artery occlusion recanalized with combination of thrombectomy, angioplasty, and arthrectomy. Not able to reestablish inline flow because of significant thrombus in the ankle and foot. The foot does fill in some of the native vessels do opacify with contrast.    A Perclose device was used to close the arteriotomy. Hemostasis was excellent upon completion.  Conscious sedation was administered with the use of IV fentanyl and midazolam under continuous physician and nurse monitoring.  Heart rate, blood pressure, and oxygen saturation were continuously monitored.  Total sedation time was 129 minutes  Upon completion of the case instrument and sharps counts were confirmed correct. The patient was transferred to the PACU in good condition. I was present for all portions of the procedure.  PLAN: Resume heparin.  Monitor for clinical improvement.  Will reevaluate in the morning.  If foot pain persists, will proceed to the OR for operative thrombectomy.  Yevonne Aline. Stanford Breed, MD [facility] Vascular and Vein Specialists of [facility] Phone Number: [phone] 11/27/2022 1:11 PM

## 2022-11-27 NOTE — Plan of Care (Signed)

## 2022-11-27 NOTE — Assessment & Plan Note (Signed)
Pleurx catheter in place

## 2022-11-27 NOTE — Progress Notes (Signed)
ANTICOAGULATION CONSULT NOTE - Follow Up Consult  Pharmacy Consult for Heparin Indication: lower extremity arterial occlusion  Allergies  Allergen Reactions   Hydrocodone Nausea Only    Dizzy, "crawl out of my skin"   Tramadol Other (See Comments)    "Weird feeling"    Patient Measurements: Height: 5\' 6"  (167.6 cm) Weight: 59 kg (130 lb 1.1 oz) IBW/kg (Calculated) : 59.3 Heparin Dosing Weight: 59 kg  Vital Signs: Temp: 97.7 F (36.5 C) (02/07 1623) Temp Source: Oral (02/07 1955) BP: 111/71 (02/07 1955) Pulse Rate: 98 (02/07 1955)  Labs: Recent Labs    11/26/22 1158 11/26/22 2024 11/27/22 0155 11/27/22 1004 11/27/22 2150  HGB 12.5 11.3*  --   --   --   HCT 36.7 34.3*  --   --   --   PLT 306 293  --   --   --   LABPROT  --  13.0  --   --   --   INR  --  1.0  --   --   --   HEPARINUNFRC  --   --  0.10* 0.26* 0.25*  CREATININE 0.73 0.82  --   --   --      Estimated Creatinine Clearance: 88.3 mL/min (by C-G formula based on SCr of 0.82 mg/dL).  Assessment: 37yo female admitted by vascular surgery 11/26/22 for LE ischemia causing pain at rest in LLE and claudication in RLE. Has stage IV lung Ca without other risk factors so suspected to be cardioembolic or thrombotic. S/p angiography, thrombectomy and atherectomy.  Heparin drip resumed 2 hrs after procedure, ~3pm.  Groin site noted without bleeding or hematoma.  Heparin level this am just prior to holding for procedure was 0.26 on 1250 units/hr. Just below target range. Total heparin during procedure = 8000 units. Heparin re-started at 1350 units/hr post-procedure.  Six hour level after resuming heparin is 0.25 which is below goal.  No issues with bleeding per RN.  Goal of Therapy:  Heparin level 0.3-0.7 units/ml Monitor platelets by anticoagulation protocol: Yes   Plan:  Give 750 units IV heparin bolus Increase heparin infusion to 1450 units/hr Repeat heparin level at 0500 Daily heparin level and CBC while on  heparin.  Lorelei Pont, PharmD, BCPS 11/27/2022 10:51 PM ED Clinical Pharmacist -  [phone]

## 2022-11-27 NOTE — Progress Notes (Signed)
ANTICOAGULATION CONSULT NOTE - Initial Consult  Pharmacy Consult for heparin Indication: bilateral lower extremity ischemia awaiting angiography  Allergies  Allergen Reactions   Hydrocodone Nausea Only    Dizzy, "crawl out of my skin"   Tramadol Other (See Comments)    "Weird feeling"    Patient Measurements: Height: 5\' 6"  (167.6 cm) Weight: 59 kg (130 lb 1.1 oz) IBW/kg (Calculated) : 59.3  Vital Signs: Temp: 98 F (36.7 C) (02/06 2353) Temp Source: Oral (02/06 2353) BP: 116/68 (02/06 2353) Pulse Rate: 106 (02/06 2353)  Labs: Recent Labs    11/26/22 1158 11/26/22 2024 11/27/22 0155  HGB 12.5 11.3*  --   HCT 36.7 34.3*  --   PLT 306 293  --   LABPROT  --  13.0  --   INR  --  1.0  --   HEPARINUNFRC  --   --  0.10*  CREATININE 0.73 0.82  --      Estimated Creatinine Clearance: 88.3 mL/min (by C-G formula based on SCr of 0.82 mg/dL).   Medical History: Past Medical History:  Diagnosis Date   Anxiety    Phreesia 11/21/2020   Cancer ([facility])     Medications:  Facility-Administered Medications Prior to Admission  Medication Dose Route Frequency Provider Last Rate Last Admin   famotidine (PEPCID) tablet 20 mg  20 mg Oral Once Acquanetta Chain, DO       Medications Prior to Admission  Medication Sig Dispense Refill Last Dose   ALPRAZolam (XANAX) 0.5 MG tablet Take 1 tablet (0.5 mg total) by mouth 3 (three) times daily as needed for anxiety. 60 tablet 0    Ascorbic Acid (VITAMIN C) 1000 MG tablet Take 1,000 mg by mouth daily.      b complex vitamins capsule Take 1 capsule by mouth daily.      benzonatate (TESSALON) 200 MG capsule Take 1 capsule (200 mg total) by mouth 4 (four) times daily as needed for cough. 120 capsule 2    cholecalciferol (VITAMIN D3) 25 MCG (1000 UNIT) tablet Take 5,000 Units by mouth daily.      dexamethasone (DECADRON) 6 MG tablet Take 1 tablet (6 mg total) by mouth every morning. Take 1/2 (3mg ) tablet on 1/23. Then take 1 tablet (6mg )  every AM starting on 1/24 or as directed. 10 tablet 0    folic acid (FOLVITE) 1 MG tablet Take 1 tablet (1 mg total) by mouth daily. 30 tablet 4    gabapentin (NEURONTIN) 100 MG capsule Take 100 mg by mouth 2 (two) times daily.  Start 100 mg twice a day x 3 days, if tolerating, increase to 200 mg twice a day. Take 2 capsules (200 mg total) by mouth 2 (two) times daily      levonorgestrel (MIRENA, 52 MG,) 20 MCG/DAY IUD 1 each by Intrauterine route once.      Magnesium Oxide -Mg Supplement 400 MG CAPS Take 400 mg by mouth 2 (two) times daily. 60 capsule 0    metoCLOPramide (REGLAN) 5 MG tablet Take 1 tablet (5 mg total) by mouth every 6 (six) hours as needed for nausea. 30 tablet 1    niacin (,VITAMIN B3,) 500 MG tablet Take 500 mg by mouth at bedtime.      ondansetron (ZOFRAN) 8 MG tablet Take 1 tablet (8 mg total) by mouth every 8 (eight) hours as needed for nausea or vomiting. 20 tablet 0    ondansetron (ZOFRAN-ODT) 8 MG disintegrating tablet Take 1 tablet (8 mg  total) by mouth every 8 (eight) hours as needed for nausea or vomiting. 20 tablet 1    Ondansetron 4 MG FILM Take 4-8 mg by mouth every 6 (six) hours as needed. 15 each 0    oxyCODONE (ROXICODONE) 5 MG immediate release tablet Take 1 tablet (5 mg total) by mouth every 4 (four) hours as needed for severe pain. 120 tablet 0    Repotrectinib 40 MG CAPS Take 160 mg by mouth daily. 56 capsule 0    scopolamine (TRANSDERM-SCOP) 1 MG/3DAYS Place 1 patch (1.5 mg total) onto the skin every 3 (three) days. 3 patch 0    Turmeric (QC TUMERIC COMPLEX PO) Take 500 mg by mouth daily.      zinc sulfate 220 (50 Zn) MG capsule Take 220 mg by mouth daily.      Scheduled:   heparin  2,500 Units Intravenous Once   pantoprazole  40 mg Oral Daily   sodium chloride flush  3 mL Intravenous Q12H   Infusions:   sodium chloride     sodium chloride 100 mL/hr at 11/26/22 2200   heparin 1,100 Units/hr (11/26/22 2208)    Assessment: 37yo female admitted by  vascular surgery for LE ischemia causing pain at rest in LLE and claudication in RLE, has stage IV lung Ca without other risk factors so suspected to be cardioembolic or thrombotic, now awaiting angiography >> to begin heparin.   Heparin level came back subtherapeutic. We will give small bolus and increase rate.  Goal of Therapy:  Heparin level 0.3-0.7 units/ml Monitor platelets by anticoagulation protocol: Yes   Plan:  Heparin 2500 units IV bolus x1 followed by increase infusion to 1250 units/hr. Check 6 hr HL  Onnie Boer, PharmD, [place], AAHIVP, CPP Infectious Disease Pharmacist 11/27/2022 3:09 AM

## 2022-11-27 NOTE — Progress Notes (Signed)
Pt came back to rm 24 from cath lab. Reinitiated tele. VSS. Call bell within reach.   Lavenia Atlas, RN

## 2022-11-27 NOTE — Progress Notes (Addendum)
Pt  complains of severe pain within one and half hours of receiving Morphine IV and 5 minutes of receiving Oxycodone. Lower extremity pulses(dorsalis pedis) detected by doppler.  Orvan Falconer, MD notified.  Pt complaining of pain 10/10. Pt has refused all other pain medications. Pt educated.

## 2022-11-27 NOTE — Progress Notes (Signed)
Dr. Trula Slade gave a verbal order for RN to start home dose of Xanax.

## 2022-11-27 NOTE — Progress Notes (Signed)
  Echocardiogram 2D Echocardiogram has been performed.  Melissa Pineda 11/27/2022, 3:38 PM

## 2022-11-27 NOTE — Consult Note (Signed)
Consultation Note Date: 11/27/2022   Patient Name: Melissa Pineda  DOB: [birth year]  MRN: [id number]  Age / Sex: 37 y.o., female  PCP: Patient, No Pcp Per Referring Physician: Cherre Robins, MD  Reason for Consultation: Non pain symptom management, Pain control, and Psychosocial/spiritual support  Patient Narrative:  Melissa Pineda is a 37 yo woman with newly diagnosed ROS1 Non-Small Cell Lung Cancer, stage IV at time of diagnosis s/p pleurx for malignant pleural effusion and known diffuse metastatic lymphadenopathy. She initiated targeted immunotherapy 5 days ago with Ertectinib and is scheduled to start Augtyro, a new oral immunotherapy medication today. She has had a very high symptom burden related to pain, dyspnea, severe fatigue and nausea. She requested palliative care support for symptom management on 1/23 at which time she was awaiting tumor genetic studies and complete staging and evaluation. She received a short course of decadron, Cox-2 NSAID and opioid titration/rotation.  She was admitted last PM urgently by vascular surgery after evaluation for acute onset leg pain, swelling and rubor led to the finding of critical limb ischemia in bilateral lower extremities and arterial occlusion.   Patient contacted me directly to notify me of her pending admission and requested ongoing acute care support.  HPI/Subjective: Difficult night with pain and symptom management. She is opioid sensitive and nervous about taking medications that she has not previously experienced.Minimal pain control with administered dose of morphine and she reported difficulty getting other pain medications. Patient took Oxycodone 10mg  from her home medication and got some relief, earlier this AM when I was notified of her pain and distress I ordered IV hydrmorphone which seemed to work well for her. She is understandably anxious about her  condition and procedures planned for today. Her mother and Aunt (physician) were at bedside when I arrived this AM. Awaiting Angiogram. They have questions about whether or not to start Augtyro and about pleurx drain management.  ASSESSMENT: Lower extremity pain from Critical Limb Ischemia, Lower extremity arterial occulusion, planned arteriogram and possible intervention today- discussed with Dr. Roselie Awkward in Vascular Lab. Metastatic ROS1-mutation Lung Cancer, on Targeted Immunotherapy Hypercoagulable with advanced malignancy-thrombotic risk high given her tumor type. 2D Echo pending.  Thrombus to be sent to pathology if possible. Cancer related Pain, Bulky adenopathy, Pleuritic pain with drain Cancer related Fatigue Nausea/Reduced Appetite: Has been improving since starting tx for cancer, premedicating with zofran prior to opioid and also has been using a scop patch.  Protein Calorie Malnutrition, due to cancer, Albumin is 2.5. Weight loss. Mild-Moderate Constipation Cancer Related Fatigue-Severe Situational Anxiety and Appropriate Grief Response to Cancer Diagnosis  RECOMMENDATIONS Oxycodone 10mg  q4PRN for moderate pain. Hydromorphone 0.5mg  IV q2prn severe pain. Valium 2.5mg  IV q4PRN anxiety/insomnia/distress Tylenol 1000mg  TID. Stop home COX-2 NSAID now that she is on anticoagulation/increased bleeding risk. Supplemental O2 PRN, Monitor frequently for pain and response to opioids. Senna-S schedule QHS for constipation/monitor closely -may need additional tx. Nutrition Consult and Protein Supplementation. Continue Magnesium supplementation 400mg  BID. Orders placed for Pleurx draining and canister per home  regimen. CXR improved on admission. Hopefully can get this removed soon. Overall decreased effusion output over past week. Zofran IV for nausea-may need additional medication- Compazine or Reglan can be added if needed.  Continue GI Protection-PPI or H2-Blocker daily.   Patient goals of  care are for aggressive treatment of her lung cancer and to also have her symptoms managed well to improve her QOL. She values shared medical decision making, detailed information and access to her care team. She has excellent family support. She is hopeful that targeted treatment will give her both length of life and QOL.   Palliative Care will be available during her acute hospitalization and provide follow-up in our clinic after discharge.    I have reviewed the medical record, interviewed the patient and family, and examined the patient. The following aspects are pertinent.  Past Medical History:  Diagnosis Date   Anxiety    Phreesia 11/21/2020   Cancer [facility])    Social History   Socioeconomic History   Marital status: Married    Spouse name: Not on file   Number of children: Not on file   Years of education: Not on file   Highest education level: Not on file  Occupational History   Not on file  Tobacco Use   Smoking status: Never   Smokeless tobacco: Never  Vaping Use   Vaping Use: Never used  Substance and Sexual Activity   Alcohol use: Yes    Alcohol/week: 7.0 standard drinks of alcohol    Types: 7 Glasses of wine per week    Comment: 1 glass wine in the evening    Drug use: Yes    Types: Marijuana    Comment: occasional   Sexual activity: Yes    Birth control/protection: None  Other Topics Concern   Not on file  Social History Narrative   Not on file   Social Determinants of Health   Financial Resource Strain: Not on file  Food Insecurity: No Food Insecurity (11/26/2022)   Hunger Vital Sign    Worried About Running Out of Food in the Last Year: Never true    Ran Out of Food in the Last Year: Never true  Transportation Needs: No Transportation Needs (11/26/2022)   PRAPARE - Hydrologist (Medical): No    Lack of Transportation (Non-Medical): No  Physical Activity: Not on file  Stress: Not on file  Social Connections: Not on file    Family History  Problem Relation Age of Onset   Cancer Mother    Cancer Maternal Grandmother    Cancer Maternal Grandfather    Hypertension Paternal Grandmother    Cancer Paternal Grandfather    Scheduled Meds:  aspirin EC  81 mg Oral Daily   repotrectinib  160 mg Oral Daily   senna-docusate  2 tablet Oral QHS   sodium chloride flush  3 mL Intravenous Q12H   Continuous Infusions:  sodium chloride     sodium chloride 1 mL/kg/hr (11/27/22 1607)   heparin 1,350 Units/hr (11/27/22 2122)   PRN Meds:.sodium chloride, acetaminophen, ALPRAZolam, diazepam, hydrALAZINE, HYDROmorphone (DILAUDID) injection, labetalol, ondansetron (ZOFRAN) IV, oxyCODONE, sodium chloride flush Medications Prior to Admission:  Prior to Admission medications   Medication Sig Start Date End Date Taking? Authorizing Provider  ALPRAZolam Duanne Moron) 0.5 MG tablet Take 1 tablet (0.5 mg total) by mouth 3 (three) times daily as needed for anxiety. Patient taking differently: Take 0.5 mg by mouth daily as needed for anxiety. 11/09/22  Yes Nita Sells, MD  benzonatate (TESSALON) 200 MG capsule Take 1 capsule (200 mg total) by mouth 4 (four) times daily as needed for cough. Patient taking differently: Take 200 mg by mouth daily as needed for cough. 11/20/22  Yes Acquanetta Chain, DO  levonorgestrel (MIRENA, 52 MG,) 20 MCG/DAY IUD 1 each by Intrauterine route once. 12/18/21  Yes [provider]  ondansetron (ZOFRAN) 8 MG tablet Take 1 tablet (8 mg total) by mouth every 8 (eight) hours as needed for nausea or vomiting. 11/12/22  Yes Curt Bears, MD  oxyCODONE (ROXICODONE) 5 MG immediate release tablet Take 1 tablet (5 mg total) by mouth every 4 (four) hours as needed for severe pain. Patient taking differently: Take 5 mg by mouth every 6 (six) hours as needed for severe pain. 11/20/22  Yes Lane Hacker L, DO  Repotrectinib 40 MG CAPS Take 160 mg by mouth daily. 11/20/22  Yes Curt Bears, MD   Turmeric (QC TUMERIC COMPLEX PO) Take 500 mg by mouth daily.   Yes [provider]  zinc sulfate 220 (50 Zn) MG capsule Take 220 mg by mouth daily.   Yes [provider]  dexamethasone (DECADRON) 6 MG tablet Take 1 tablet (6 mg total) by mouth every morning. Take 1/2 (3mg ) tablet on 1/23. Then take 1 tablet (6mg ) every AM starting on 1/24 or as directed. Patient not taking: Reported on 11/27/2022 11/12/22   Acquanetta Chain, DO  folic acid (FOLVITE) 1 MG tablet Take 1 tablet (1 mg total) by mouth daily. Patient not taking: Reported on 11/27/2022 11/12/22   Curt Bears, MD  gabapentin (NEURONTIN) 100 MG capsule Take 100 mg by mouth 2 (two) times daily.  Start 100 mg twice a day x 3 days, if tolerating, increase to 200 mg twice a day. Take 2 capsules (200 mg total) by mouth 2 (two) times daily Patient not taking: Reported on 11/27/2022 11/19/22   [provider]  Magnesium Oxide -Mg Supplement 400 MG CAPS Take 400 mg by mouth 2 (two) times daily. Patient not taking: Reported on 11/27/2022 11/12/22   Acquanetta Chain, DO  metoCLOPramide (REGLAN) 5 MG tablet Take 1 tablet (5 mg total) by mouth every 6 (six) hours as needed for nausea. Patient not taking: Reported on 11/27/2022 11/18/22   Lane Hacker L, DO  ondansetron (ZOFRAN-ODT) 8 MG disintegrating tablet Take 1 tablet (8 mg total) by mouth every 8 (eight) hours as needed for nausea or vomiting. Patient not taking: Reported on 11/27/2022 11/15/22   Parrett, Fonnie Mu, NP  Ondansetron 4 MG FILM Take 4-8 mg by mouth every 6 (six) hours as needed. Patient not taking: Reported on 11/27/2022 11/12/22   Acquanetta Chain, DO   Allergies  Allergen Reactions   Hydrocodone Nausea Only    Dizzy, "crawl out of my skin"   Tramadol Other (See Comments)    "Weird feeling"     Physical Exam  Vital Signs: BP 111/71 (BP Location: Right Arm)   Pulse 98   Temp 97.7 F (36.5 C) (Oral)   Resp 20   Ht 5\' 6"  (1.676 m)   Wt 59  kg   LMP 10/14/2022 (Exact Date) Comment: Mirena  SpO2 96%   BMI 20.99 kg/m  Pain Scale: 0-10 POSS *See Group Information*: 1-Acceptable,Awake and alert Pain Score: 5    SpO2: SpO2: 96 % O2 Device:SpO2: 96 % O2 Flow Rate: .O2 Flow Rate (L/min): 2 L/min  IO: Intake/output summary:  Intake/Output Summary (Last  24 hours) at 11/27/2022 2211 Last data filed at 11/27/2022 2107 Gross per 24 hour  Intake 1734.52 ml  Output 400 ml  Net 1334.52 ml    LBM: Last BM Date : 11/25/22 Baseline Weight: Weight: 59 kg Most recent weight: Weight: 59 kg  Left foot is hyperemic and extremely painful to touch. Right foot appears pale and slightly dusky in appearance. No edema or pitting edema noted. DP are not palpable. She is able to bear weight but has significant pain with ambulation. Needs assistance.  I reviewed all documentation, labs, imaging and diagnostics related to this admission.  Lane Hacker, DO Palliative Medicine   Time Total: 90 minutes  Greater than 50%  of this time was spent counseling and coordinating care related to the above assessment and plan.  Signed by: Lane Hacker, DO   Please contact Palliative Medicine Team phone at [phone] for questions and concerns.  For individual provider: See Shea Evans

## 2022-11-27 NOTE — Progress Notes (Signed)
CHART NOTE The send can resume her home treatment with the targeted therapy with repotrectinib 160 mg p.o. daily during her hospitalization.

## 2022-11-27 NOTE — Telephone Encounter (Addendum)
1457-He asked to speak to [facility] re pt immunotherapy. I could not understanding the first 3 digits of his phone number. Note in chart from [facility] re immunotherapy.

## 2022-11-27 NOTE — Progress Notes (Signed)
ANTICOAGULATION CONSULT NOTE - Follow Up Consult  Pharmacy Consult for Heparin Indication: lower extremity arterial occlusion  Allergies  Allergen Reactions   Hydrocodone Nausea Only    Dizzy, "crawl out of my skin"   Tramadol Other (See Comments)    "Weird feeling"    Patient Measurements: Height: 5\' 6"  (167.6 cm) Weight: 59 kg (130 lb 1.1 oz) IBW/kg (Calculated) : 59.3 Heparin Dosing Weight: 59 kg  Vital Signs: Temp: 97.7 F (36.5 C) (02/07 1345) Temp Source: Axillary (02/07 1345) BP: 117/75 (02/07 1345) Pulse Rate: 98 (02/07 1345)  Labs: Recent Labs    11/26/22 1158 11/26/22 2024 11/27/22 0155 11/27/22 1004  HGB 12.5 11.3*  --   --   HCT 36.7 34.3*  --   --   PLT 306 293  --   --   LABPROT  --  13.0  --   --   INR  --  1.0  --   --   HEPARINUNFRC  --   --  0.10* 0.26*  CREATININE 0.73 0.82  --   --     Estimated Creatinine Clearance: 88.3 mL/min (by C-G formula based on SCr of 0.82 mg/dL).  Assessment: 37yo female admitted by vascular surgery 11/26/22 for LE ischemia causing pain at rest in LLE and claudication in RLE. Has stage IV lung Ca without other risk factors so suspected to be cardioembolic or thrombotic. S/p angiography, thrombectomy and atherectomy.  Heparin drip to resume 2 hrs after procedure, ~3pm.  Groin site noted without bleeding or hematoma.  Heparin level this am just prior to holding for procedure was 0.26 on 1250 units/hr. Just below target range. Total heparin during procedure = 8000 units.  Goal of Therapy:  Heparin level 0.3-0.7 units/ml Monitor platelets by anticoagulation protocol: Yes   Plan:  Resume heparin drip ~3pm at 1350 units/hr Heparin level ~6 hrs after drip resumes. Daily heparin level and CBC while on heparin.  Arty Baumgartner, RPh 11/27/2022,2:30 PM

## 2022-11-27 NOTE — Progress Notes (Signed)
DIAGNOSIS: Stage IVB (T4, N3, M1b) non-small cell lung cancer, adenocarcinoma presented with large left diaphragmatic surface mass in addition to left hilar, infrahilar, AP window, right and left paratracheal, subcarinal as well as left internal mammary lymphadenopathy in addition to right gastric lymph node and solitary Small right frontal calvarial osseous metastatic disease with large malignant left pleural effusion diagnosed in January 2024.     Molecular studies by [employer] as well as [employer] blood test showed:    EZR-ROS1 fusion approved by FDA Crizotinib, Entrectinib, Repotrectinib   approved in other indication Ceritinib, Lorlatinib Yes      3.6%   PD-L1 expression by [employer] that was 98%.     PRIOR THERAPY: Status post left Pleurx catheter placement for drainage of recurrent left pleural effusion   CURRENT THERAPY: Repotrectinib (Augtyro) 160 mg p.o. daily for the first 2 weeks and then 160 mg p.o. twice daily after that if tolerated.  She initially started treatment with Entrectinib 600 mg p.o. daily for 4 days until repotrectinib was available and she is starting the first dose on November 27, 2022  Subjective: The patient seen and examined today.  Her mother and husband were available at the bedside.  She has been tolerating her treatment with Entrectinib fairly well with less drainage of the left pleural effusion.  She complains of swelling of the lower extremities 2 days ago and I ordered Doppler of the lower extremity to rule out deep venous thrombosis.  It did not show any evidence for deep venous thrombosis but unfortunately showed concerning finding of arterial occlusion.  She had arterial Doppler performed yesterday and it showed total occlusion noted in the popliteal artery and extending into the tibioperoneal trunk with total occlusion noted in the peroneal artery and absent great toe waveform.  There was also total occlusion noted in the deep femoral artery and  total occlusion noted in the mid to distal popliteal artery and extending into the tibioperoneal clinical chronic on the left with total occlusion noted in the distal segment of the anterior tibial artery and total occlusion of the peroneal artery as well as the dorsalis pedis artery.  The patient was seen quickly yesterday by Dr. Stanford Breed and she was admitted to the hospital for angiography, thrombectomy and arthrectomy.  She is currently on heparin drip.  She is feeling a little bit better.  She had 2D echo but the results are still pending.  She denied having any current nausea, vomiting, diarrhea or constipation.  Objective: Vital signs in last 24 hours: Temp:  [97.6 F (36.4 C)-98.2 F (36.8 C)] 97.7 F (36.5 C) (02/07 1623) Pulse Rate:  [0-109] 103 (02/07 1623) Resp:  [11-22] 20 (02/07 1625) BP: (100-127)/(62-98) 125/74 (02/07 1625) SpO2:  [91 %-100 %] 98 % (02/07 1623) FiO2 (%):  [0 %] 0 % (02/06 1933) Weight:  [130 lb 1.1 oz (59 kg)] 130 lb 1.1 oz (59 kg) (02/06 1900)  Intake/Output from previous day: 02/06 0701 - 02/07 0700 In: -  Out: 400 [Urine:400] Intake/Output this shift: Total I/O In: 1667 [P.O.:180; I.V.:1487] Out: -   General appearance: alert, cooperative, fatigued, and no distress Resp: diminished breath sounds LLL and dullness to percussion LLL Cardio: regular rate and rhythm, S1, S2 normal, no murmur, click, rub or gallop GI: soft, non-tender; bowel sounds normal; no masses,  no organomegaly Extremities: edema trace edema  Lab Results:  Recent Labs    11/26/22 1158 11/26/22 2024  WBC 16.5* 14.7*  HGB 12.5 11.3*  HCT 36.7 34.3*  PLT 306 293   BMET Recent Labs    11/26/22 1158 11/26/22 2024  NA 135 132*  K 4.3 4.0  CL 100 99  CO2 30 25  GLUCOSE 89 99  BUN 10 16  CREATININE 0.73 0.82  CALCIUM 8.6* 8.2*    Studies/Results: VAS [place] LOWER EXTREMITY ARTERIAL DUPLEX  Result Date: 11/26/2022 LOWER EXTREMITY ARTERIAL DUPLEX STUDY Patient Name:  Melissa Pineda  Date of Exam:   11/26/2022 Medical Rec #: [id number]     Accession #:    [id number] Date of Birth: October 10, 1986      Patient Gender: F Patient Age:   37 years Exam Location:  [facility] Procedure:      VAS [place] LOWER EXTREMITY ARTERIAL DUPLEX Referring Phys: [facility] Sacoya Mcgourty --------------------------------------------------------------------------------  Indications: Incidental finding on lower extremity venous examination- bilateral              poplital artery occlusion in the setting of pain for 1-2 weeks. New              paresthesia sensation- left worse than right. Other Factors: Stage 4 lung cancer.  Current ABI: N/A Comparison Study: No prior studies. Performing Technologist: Darlin Coco RDMS RVT  Examination Guidelines: A complete evaluation includes B-mode imaging, spectral Doppler, color Doppler, and power Doppler as needed of all accessible portions of each vessel. Bilateral testing is considered an integral part of a complete examination. Limited examinations for reoccurring indications may be performed as noted.  +-----------+--------+-----+--------+---------+-----------+ RIGHT      PSV cm/sRatioStenosisWaveform Comments    +-----------+--------+-----+--------+---------+-----------+ CFA Prox   74                   triphasic            +-----------+--------+-----+--------+---------+-----------+ CFA Mid    61                   triphasic            +-----------+--------+-----+--------+---------+-----------+ CFA Distal 59                   triphasic            +-----------+--------+-----+--------+---------+-----------+ DFA        74                   triphasic            +-----------+--------+-----+--------+---------+-----------+ SFA Prox   69                   triphasic            +-----------+--------+-----+--------+---------+-----------+ SFA Mid    76                   triphasic             +-----------+--------+-----+--------+---------+-----------+ SFA Distal 48                   triphasic            +-----------+--------+-----+--------+---------+-----------+ POP Prox   33                   triphasic            +-----------+--------+-----+--------+---------+-----------+ POP Mid                 occluded                     +-----------+--------+-----+--------+---------+-----------+  POP Distal              occluded                     +-----------+--------+-----+--------+---------+-----------+ TP Trunk                occluded                     +-----------+--------+-----+--------+---------+-----------+ ATA Prox   22                   biphasic             +-----------+--------+-----+--------+---------+-----------+ ATA Mid    16                   biphasic             +-----------+--------+-----+--------+---------+-----------+ ATA Distal 15                   biphasic             +-----------+--------+-----+--------+---------+-----------+ PTA Prox   12                   biphasic Collaterals +-----------+--------+-----+--------+---------+-----------+ PTA Mid    27                   biphasic             +-----------+--------+-----+--------+---------+-----------+ PTA Distal 27                   biphasic             +-----------+--------+-----+--------+---------+-----------+ PERO Prox               occluded                     +-----------+--------+-----+--------+---------+-----------+ PERO Mid                occluded                     +-----------+--------+-----+--------+---------+-----------+ PERO Distal             occluded                     +-----------+--------+-----+--------+---------+-----------+ DP         8                    biphasic             +-----------+--------+-----+--------+---------+-----------+  +-----------+--------+-----+--------+----------+--------------------+ LEFT       PSV  cm/sRatioStenosisWaveform  Comments             +-----------+--------+-----+--------+----------+--------------------+ CFA Prox   57                   triphasic                      +-----------+--------+-----+--------+----------+--------------------+ CFA Mid    67                   triphasic                      +-----------+--------+-----+--------+----------+--------------------+ CFA Distal 71                   triphasic                      +-----------+--------+-----+--------+----------+--------------------+  DFA                     occluded                               +-----------+--------+-----+--------+----------+--------------------+ SFA Prox   69                   triphasic                      +-----------+--------+-----+--------+----------+--------------------+ SFA Mid    92                   triphasic                      +-----------+--------+-----+--------+----------+--------------------+ SFA Distal 42                   triphasic                      +-----------+--------+-----+--------+----------+--------------------+ POP Prox   37                   triphasic                      +-----------+--------+-----+--------+----------+--------------------+ POP Mid                 occluded                               +-----------+--------+-----+--------+----------+--------------------+ POP Distal              occluded                               +-----------+--------+-----+--------+----------+--------------------+ TP Trunk                occluded                               +-----------+--------+-----+--------+----------+--------------------+ ATA Prox   41                   biphasic                       +-----------+--------+-----+--------+----------+--------------------+ ATA Mid    22                   biphasic                       +-----------+--------+-----+--------+----------+--------------------+ ATA  Distal              occluded                               +-----------+--------+-----+--------+----------+--------------------+ PTA Prox                occluded          Collaterals prox-mid +-----------+--------+-----+--------+----------+--------------------+ PTA Mid    25                   monophasic                     +-----------+--------+-----+--------+----------+--------------------+  PTA Distal 23                   monophasic                     +-----------+--------+-----+--------+----------+--------------------+ PERO Prox               occluded                               +-----------+--------+-----+--------+----------+--------------------+ PERO Mid                occluded                               +-----------+--------+-----+--------+----------+--------------------+ PERO Distal25                             Collateral           +-----------+--------+-----+--------+----------+--------------------+ DP                      occluded                               +-----------+--------+-----+--------+----------+--------------------+  Summary: Right: Total occlusion noted in the popliteal artery and extending into the tibioperoneal trunk. Total occlusion noted in the peroneal artery. Absent great toe waveform. Left: Total occlusion noted in the deep femoral artery. Total occlusion noted in the mid to distal popliteal artery and extending into the tibioperoneal trunk. Total occlusion noted in the distal segment of the anterior tibial artery. Total occlusion noted in the peroneal artery. Total occlusion noted in the dorsal pedis artery. Dampened great toe waveform.  See table(s) above for measurements and observations. Electronically signed by Harold Barban MD on 11/26/2022 at 11:48:53 PM.    Final    VAS [place] LOWER EXTREMITY VENOUS (DVT)  Result Date: 11/26/2022  Lower Venous DVT Study Patient Name:  Melissa Pineda  Date of Exam:   11/26/2022 Medical Rec #:  [id number]     Accession #:    [id number] Date of Birth: July 05, 1986      Patient Gender: F Patient Age:   37 years Exam Location:  [facility] Procedure:      VAS [place] LOWER EXTREMITY VENOUS (DVT) Referring Phys: [facility] --------------------------------------------------------------------------------  Indications: Pain in bilateral lower extremities. Left worse than right. Patient reports pain for approximately 1-2 weeks, described as "shin splint" sensation, pain behind knees, and new numbness/tingling of feet.  Risk Factors: Cancer - Stage 4, Lung. Comparison Study: No prior studies. Performing Technologist: Darlin Coco RDMS, RVT  Examination Guidelines: A complete evaluation includes B-mode imaging, spectral Doppler, color Doppler, and power Doppler as needed of all accessible portions of each vessel. Bilateral testing is considered an integral part of a complete examination. Limited examinations for reoccurring indications may be performed as noted. The reflux portion of the exam is performed with the patient in reverse Trendelenburg.  +---------+---------------+---------+-----------+----------+--------------+ RIGHT    CompressibilityPhasicitySpontaneityPropertiesThrombus Aging +---------+---------------+---------+-----------+----------+--------------+ CFV      Full           Yes      Yes                                 +---------+---------------+---------+-----------+----------+--------------+  SFJ      Full                                                        +---------+---------------+---------+-----------+----------+--------------+ FV Prox  Full                                                        +---------+---------------+---------+-----------+----------+--------------+ FV Mid   Full                                                        +---------+---------------+---------+-----------+----------+--------------+ FV DistalFull                                                         +---------+---------------+---------+-----------+----------+--------------+ PFV      Full                                                        +---------+---------------+---------+-----------+----------+--------------+ POP      Full           Yes      Yes                                 +---------+---------------+---------+-----------+----------+--------------+ PTV      Full                                                        +---------+---------------+---------+-----------+----------+--------------+ PERO     Full                                                        +---------+---------------+---------+-----------+----------+--------------+   +---------+---------------+---------+-----------+----------+--------------+ LEFT     CompressibilityPhasicitySpontaneityPropertiesThrombus Aging +---------+---------------+---------+-----------+----------+--------------+ CFV      Full           Yes      Yes                                 +---------+---------------+---------+-----------+----------+--------------+ SFJ      Full                                                        +---------+---------------+---------+-----------+----------+--------------+  FV Prox  Full                                                        +---------+---------------+---------+-----------+----------+--------------+ FV Mid   Full                                                        +---------+---------------+---------+-----------+----------+--------------+ FV DistalFull                                                        +---------+---------------+---------+-----------+----------+--------------+ PFV      Full                                                        +---------+---------------+---------+-----------+----------+--------------+ POP      Full           Yes      Yes                                  +---------+---------------+---------+-----------+----------+--------------+ PTV      Full                                                        +---------+---------------+---------+-----------+----------+--------------+ PERO     Full                                                        +---------+---------------+---------+-----------+----------+--------------+     Summary: RIGHT: - There is no evidence of deep vein thrombosis in the lower extremity.  - No cystic structure found in the popliteal fossa.  - Occlusion of the RIGHT popliteal artery was noted incidentally on examination with collateralized PTA/DPA flow distally.  LEFT: - There is no evidence of deep vein thrombosis in the lower extremity.  - No cystic structure found in the popliteal fossa.  - - Occlusion of the LEFT popliteal artery was noted incidentally on examination with collateralized PTA/DPA flow distally.  *See table(s) above for measurements and observations. Electronically signed by Harold Barban MD on 11/26/2022 at 11:48:26 PM.    Final    DG Chest 1 View  Result Date: 11/26/2022 CLINICAL DATA:  Follow-up pleural effusion EXAM: CHEST  1 VIEW COMPARISON:  11/15/2022 FINDINGS: Right chest remains clear. Pleural catheter remains in place at the inferior pleural space on the left. There is a persistent pleural effusion on the left, moderate in size, with atelectasis of the left lower  lung. IMPRESSION: Persistent moderate left effusion with left lower lung atelectasis. Pleural catheter remains in place at the inferior left pleural space. Electronically Signed   By: Nelson Chimes M.D.   On: 11/26/2022 20:59    Medications: I have reviewed the patient's current medications.  CODE STATUS: Full code  Assessment/Plan: This is a very pleasant 37 years old white female with Stage IVB (T4, N3, M1b) non-small cell lung cancer, adenocarcinoma presented with large left diaphragmatic surface mass in addition to left hilar, infrahilar, AP  window, right and left paratracheal, subcarinal as well as left internal mammary lymphadenopathy in addition to right gastric lymph node and solitary Small right frontal calvarial osseous metastatic disease with large malignant left pleural effusion diagnosed in January 2024.   She had molecular studies that was positive for EZR-ROS1 fusion.   She started treatment initially with Entrectinib for few days until the approval of repotrectinib which became available to the patient today.  She will start the first dose of repotrectinib 160 mg p.o. daily for the next 2 weeks before switching to 160 mg p.o. twice daily as tolerated.  I recommended for the patient to continue her treatment with the targeted therapy as planned.  I will continue to monitor her blood work and condition closely. For the arterial embolic occlusion, the patient underwent angiography, thrombectomy and atherectomy by Dr. Stanford Breed.  She is feeling a little bit better but she will be n.p.o. after midnight for possibility of another surgical intervention tomorrow. The patient has 2D echo but the results are still pending. I will also arrange for the patient to have hypercoagulable panel on outpatient basis after discharge.  She has no family history of clotting or coagulation issues. For the left pleural effusion, the patient will continue drainage and this may change to every other day and eventually once or twice a week until resolution of the left pleural effusion and discontinuation of the Pleurx catheter by pulmonary medicine. The patient and her family had a lot of questions and I answered them completely to their satisfactions.  Thank you so much for taking good care of of Ms. Tonkovich.  I will continue to follow the patient with you and assist in her management on as-needed basis during her hospitalization and I will arrange a follow-up appointment for her after discharge for close monitoring of her condition.   LOS: 1 day    Melissa Pineda 11/27/2022

## 2022-11-28 ENCOUNTER — Telehealth: Payer: Self-pay | Admitting: *Deleted

## 2022-11-28 ENCOUNTER — Inpatient Hospital Stay (HOSPITAL_COMMUNITY): Payer: BC Managed Care – PPO

## 2022-11-28 ENCOUNTER — Inpatient Hospital Stay (HOSPITAL_COMMUNITY)
Admission: RE | Admit: 2022-11-28 | Payer: BC Managed Care – PPO | Source: Home / Self Care | Admitting: Vascular Surgery

## 2022-11-28 ENCOUNTER — Encounter: Payer: Self-pay | Admitting: *Deleted

## 2022-11-28 ENCOUNTER — Encounter (HOSPITAL_COMMUNITY): Payer: Self-pay | Admitting: Vascular Surgery

## 2022-11-28 ENCOUNTER — Encounter (HOSPITAL_COMMUNITY)
Disposition: A | Discharge status: Home / Self Care | Payer: Self-pay | Source: Ambulatory Visit | Attending: Vascular Surgery

## 2022-11-28 DIAGNOSIS — Z515 Encounter for palliative care: Secondary | ICD-10-CM

## 2022-11-28 DIAGNOSIS — I743 Embolism and thrombosis of arteries of the lower extremities: Secondary | ICD-10-CM

## 2022-11-28 LAB — CBC
HCT: 26.2 % — ABNORMAL LOW (ref 36.0–46.0)
Hemoglobin: 9.1 g/dL — ABNORMAL LOW (ref 12.0–15.0)
MCH: 29.1 pg (ref 26.0–34.0)
MCHC: 34.7 g/dL (ref 30.0–36.0)
MCV: 83.7 fL (ref 80.0–100.0)
Platelets: 312 K/uL (ref 150–400)
RBC: 3.13 MIL/uL — ABNORMAL LOW (ref 3.87–5.11)
RDW: 13.9 % (ref 11.5–15.5)
WBC: 10.2 K/uL (ref 4.0–10.5)
nRBC: 0 % (ref 0.0–0.2)

## 2022-11-28 LAB — HEPARIN LEVEL (UNFRACTIONATED)
Heparin Unfractionated: 0.45 [IU]/mL (ref 0.30–0.70)
Heparin Unfractionated: 0.64 IU/mL (ref 0.30–0.70)

## 2022-11-28 LAB — LIPID PANEL
Cholesterol: 166 mg/dL (ref 0–200)
HDL: 36 mg/dL — ABNORMAL LOW (ref >40)
LDL Cholesterol: 104 mg/dL — ABNORMAL HIGH (ref 0–99)
Total CHOL/HDL Ratio: 4.6 RATIO
Triglycerides: 131 mg/dL (ref <150)
VLDL: 26 mg/dL (ref 0–40)

## 2022-11-28 LAB — CK: Total CK: 27 U/L — ABNORMAL LOW (ref 38–234)

## 2022-11-28 SURGERY — CREATION, BYPASS, ARTERIAL, POPLITEAL TO TIBIAL, USING GRAFT
Anesthesia: Choice | Laterality: Left

## 2022-11-28 MED ORDER — IOHEXOL 350 MG/ML SOLN
75.0000 mL | Freq: Once | INTRAVENOUS | Status: AC | PRN
  Administered 2022-11-28: 75 mL via INTRAVENOUS

## 2022-11-28 MED ORDER — CLOPIDOGREL BISULFATE 75 MG PO TABS
75.0000 mg | ORAL_TABLET | Freq: Every day | ORAL | Status: DC
  Administered 2022-11-29: 75 mg via ORAL
  Filled 2022-11-28: qty 1

## 2022-11-28 MED ORDER — PANTOPRAZOLE SODIUM 40 MG PO TBEC
40.0000 mg | DELAYED_RELEASE_TABLET | Freq: Every day | ORAL | Status: DC
  Administered 2022-11-28 – 2022-11-29 (×2): 40 mg via ORAL
  Filled 2022-11-28 (×2): qty 1

## 2022-11-28 MED ORDER — RIVAROXABAN 20 MG PO TABS: 20.0000 mg | ORAL_TABLET | Freq: Every day | ORAL | Status: DC

## 2022-11-28 MED ORDER — RIVAROXABAN 15 MG PO TABS
15.0000 mg | ORAL_TABLET | Freq: Two times a day (BID) | ORAL | Status: DC
  Administered 2022-11-28 – 2022-11-29 (×3): 15 mg via ORAL
  Filled 2022-11-28 (×3): qty 1

## 2022-11-28 MED FILL — Midazolam HCl Inj 5 MG/5ML (Base Equivalent): INTRAMUSCULAR | Qty: 1 | Status: AC

## 2022-11-28 NOTE — Telephone Encounter (Signed)
Letter completed and emailed to patient

## 2022-11-28 NOTE — Telephone Encounter (Signed)
Pt called requesting a letter stating she needs to be on leave for 1 month and " will be re-evaluated based on response to treatment".  Please advise.  She has a previous letter that has her out until tomorrow.

## 2022-11-28 NOTE — Evaluation (Signed)
Physical Therapy Evaluation Patient Details Name: Melissa Pineda MRN: [id number] DOB: Jan 13, 1986 Today's Date: 11/28/2022  History of Present Illness  37 yo female s/p LLE angiography with mechanical thrombectomy profunda femoris and popliteal artery, laser atherectomy and angioplasty (3 x 274mm) of left anterior tibial artery on 2/7.  PMH includes newly diagnosed ROS1 Non-Small Cell Lung Cancer, stage IV at time of diagnosis s/p pleurx for malignant pleural effusion and known diffuse metastatic lymphadenopathy.  Clinical Impression   Pt presents with LLE pain, min unsteadiness during mobility which pt endorses is baseline, and min decreased activity tolerance. Pt to benefit from acute PT to address deficits. Pt ambulated hallway distance with increased time and periods of unsteadiness, all pt-corrected. Pt has support of husband and family at d/c, no follow up PT needs anticipated but will continue to follow while acute for improving activity tolerance and higher level balance.          Recommendations for follow up therapy are one component of a multi-disciplinary discharge planning process, led by the attending physician.  Recommendations may be updated based on patient status, additional functional criteria and insurance authorization.  Follow Up Recommendations No PT follow up      Assistance Recommended at Discharge PRN  Patient can return home with the following       Equipment Recommendations None recommended by PT  Recommendations for Other Services       Functional Status Assessment Patient has had a recent decline in their functional status and demonstrates the ability to make significant improvements in function in a reasonable and predictable amount of time.     Precautions / Restrictions Precautions Precautions: Fall Restrictions Weight Bearing Restrictions: No      Mobility  Bed Mobility Overal bed mobility: Modified Independent                   Transfers Overall transfer level: Modified independent                      Ambulation/Gait Ambulation/Gait assistance: Modified independent (Device/Increase time) Gait Distance (Feet): 350 Feet Assistive device: None Gait Pattern/deviations: Step-through pattern, Decreased stride length, Drifts right/left, Narrow base of support Gait velocity: decr     General Gait Details: mod I for increased time, pt with occasional unsteadiness all pt-corrected, pt reports this is due to LLE pain and immunotherapy treatment  Stairs Stairs:  (pt declines the need to practice)          Wheelchair Mobility    Modified Rankin (Stroke Patients Only)       Balance Overall balance assessment: Modified Independent                                           Pertinent Vitals/Pain Pain Assessment Pain Assessment: 0-10 Pain Score: 2  Pain Location: LLE Pain Descriptors / Indicators: Sore Pain Intervention(s): Limited activity within patient's tolerance, Monitored during session, Repositioned    Home Living Family/patient expects to be discharged to:: Private residence Living Arrangements: Spouse/significant other Available Help at Discharge: Family Type of Home: House Home Access: Stairs to enter   Technical brewer of Steps: a few   Home Layout: One level Home Equipment: None      Prior Function Prior Level of Function : Independent/Modified Independent  Hand Dominance   Dominant Hand: Right    Extremity/Trunk Assessment   Upper Extremity Assessment Upper Extremity Assessment: Defer to OT evaluation    Lower Extremity Assessment Lower Extremity Assessment: Overall WFL for tasks assessed;LLE deficits/detail LLE Deficits / Details: anticipated post-op soreness    Cervical / Trunk Assessment Cervical / Trunk Assessment: Normal  Communication   Communication: No difficulties  Cognition Arousal/Alertness:  Awake/alert Behavior During Therapy: WFL for tasks assessed/performed Overall Cognitive Status: Within Functional Limits for tasks assessed                                          General Comments      Exercises     Assessment/Plan    PT Assessment Patient needs continued PT services  PT Problem List Decreased mobility;Decreased activity tolerance;Decreased balance;Decreased knowledge of use of DME;Pain       PT Treatment Interventions DME instruction;Therapeutic activities;Gait training;Therapeutic exercise;Patient/family education;Balance training;Stair training;Functional mobility training;Neuromuscular re-education    PT Goals (Current goals can be found in the Care Plan section)  Acute Rehab PT Goals Patient Stated Goal: home PT Goal Formulation: With patient Time For Goal Achievement: 12/12/22 Potential to Achieve Goals: Good    Frequency Min 3X/week     Co-evaluation               AM-PAC PT "6 Clicks" Mobility  Outcome Measure Help needed turning from your back to your side while in a flat bed without using bedrails?: None Help needed moving from lying on your back to sitting on the side of a flat bed without using bedrails?: None Help needed moving to and from a bed to a chair (including a wheelchair)?: None Help needed standing up from a chair using your arms (e.g., wheelchair or bedside chair)?: None Help needed to walk in hospital room?: None Help needed climbing 3-5 steps with a railing? : A Little 6 Click Score: 23    End of Session   Activity Tolerance: Patient tolerated treatment well Patient left: in bed;with call bell/phone within reach;with family/visitor present Nurse Communication: Mobility status PT Visit Diagnosis: Other abnormalities of gait and mobility (R26.89);Pain Pain - Right/Left: Left Pain - part of body: Leg    Time: 9924-2683 PT Time Calculation (min) (ACUTE ONLY): 12 min   Charges:   PT Evaluation $PT  Eval Low Complexity: 1 Low         Aprill Banko S, PT DPT Acute Rehabilitation Services Pager [phone]  Office [phone]   Roxine Caddy E Ruffin Pyo 11/28/2022, 3:59 PM

## 2022-11-28 NOTE — Progress Notes (Signed)
[facility] [place] PROGRESS NOTE  ASSESSMENT / PLAN: Scott Fix is a 37 y.o. 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  Pain has resolved this morning.  She continues to be pain-free today.  I am hopeful we have rounded the corner, and we have improved her blood flow enough to get her out of trouble.  We will start her on triple therapy with aspirin, Plavix, and DOAC.  Will check CT angiogram of the chest tonight.  Patient would like to be discharged tomorrow if she continues to feel well.  I think this is appropriate.  SUBJECTIVE: Doing much better today.  Foot feels improved over the course of the day over my serial evaluations.  She was able to get up and walk without claudication type symptoms.  OBJECTIVE: BP (!) 110/58 (BP Location: Right Arm)   Pulse (!) 108   Temp 97.9 F (36.6 C) (Oral)   Resp 14   Ht 5\' 6"  (1.676 m)   Wt 59 kg   LMP 10/14/2022 (Exact Date) Comment: Mirena  SpO2 97%   BMI 20.99 kg/m   Intake/Output Summary (Last 24 hours) at 11/28/2022 1704 Last data filed at 11/28/2022 1200 Gross per 24 hour  Intake 414.62 ml  Output 250 ml  Net 164.62 ml    No acute distress Brisk Doppler flow in the right foot Right foot is pink     Latest Ref Rng & Units 11/28/2022    1:08 AM 11/26/2022    8:24 PM 11/26/2022   11:58 AM  CBC  WBC 4.0 - 10.5 K/uL 10.2  14.7  16.5   Hemoglobin 12.0 - 15.0 g/dL 9.1  11.3  12.5   Hematocrit 36.0 - 46.0 % 26.2  34.3  36.7   Platelets 150 - 400 K/uL 312  293  306         Latest Ref Rng & Units 11/26/2022    8:24 PM 11/26/2022   11:58 AM 11/20/2022    9:06 AM  CMP  Glucose 70 - 99 mg/dL 99  89  145   BUN 6 - 20 mg/dL 16  10  15    Creatinine 0.44 - 1.00 mg/dL 0.82  0.73  0.90   Sodium 135 - 145 mmol/L 132  135  130   Potassium 3.5 - 5.1 mmol/L 4.0  4.3  5.3   Chloride 98 - 111 mmol/L 99  100  94   CO2 22 - 32 mmol/L 25  30  28    Calcium 8.9 - 10.3 mg/dL  8.2  8.6  8.4   Total Protein 6.5 - 8.1 g/dL 5.3  5.7  5.7   Total Bilirubin 0.3 - 1.2 mg/dL 0.2  0.4  0.6   Alkaline Phos 38 - 126 U/L 65  71  72   AST 15 - 41 U/L 27  21  14    ALT 0 - 44 U/L 35  30  46     Estimated Creatinine Clearance: 88.3 mL/min (by C-G formula based on SCr of 0.82 mg/dL).  Yevonne Aline. Stanford Breed, MD [facility] Phone Number: [phone] 11/28/2022 5:04 PM

## 2022-11-28 NOTE — Progress Notes (Signed)
This nurse scanned letter from patients chart to her email address of [email or web address] per the patient request due to her being inpatient at this time. No concerns noted at this time.

## 2022-11-28 NOTE — Progress Notes (Addendum)
Hold xalrelto po and continued heparin drip until further notice per pharmacist's advice.  Lavenia Atlas, RN

## 2022-11-28 NOTE — Progress Notes (Signed)
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 arteries now s/p thrombectomy  Allergies  Allergen Reactions   Hydrocodone Nausea Only    Dizzy, "crawl out of my skin"   Tramadol Other (See Comments)    "Weird feeling"    Patient Measurements: Height: 5\' 6"  (167.6 cm) Weight: 59 kg (130 lb 1.1 oz) IBW/kg (Calculated) : 59.3  Vital Signs: Temp: 98.4 F (36.9 C) (02/08 1725) Temp Source: Oral (02/08 1725) BP: 113/72 (02/08 1725) Pulse Rate: 104 (02/08 1725)  Labs: Recent Labs    11/26/22 1158 11/26/22 2024 11/27/22 0155 11/27/22 2150 11/28/22 0108 11/28/22 0903  HGB 12.5 11.3*  --   --  9.1*  --   HCT 36.7 34.3*  --   --  26.2*  --   PLT 306 293  --   --  312  --   LABPROT  --  13.0  --   --   --   --   INR  --  1.0  --   --   --   --   HEPARINUNFRC  --   --    < > 0.25* 0.45 0.64  CREATININE 0.73 0.82  --   --   --   --   CKTOTAL  --   --   --   --  27*  --    < > = values in this interval not displayed.    Estimated Creatinine Clearance: 88.3 mL/min (by C-G formula based on SCr of 0.82 mg/dL).   Medical History: Past Medical History:  Diagnosis Date   Anxiety    [employer] 11/21/2020   Cancer ([place])     Medications:  Facility-Administered Medications Prior to Admission  Medication Dose Route Frequency Provider Last Rate Last Admin   famotidine (PEPCID) tablet 20 mg  20 mg Oral Once Acquanetta Chain, DO       Medications Prior to Admission  Medication Sig Dispense Refill Last Dose   ALPRAZolam (XANAX) 0.5 MG tablet Take 1 tablet (0.5 mg total) by mouth 3 (three) times daily as needed for anxiety. (Patient taking differently: Take 0.5 mg by mouth daily as needed for anxiety.) 60 tablet 0 11/26/2022   benzonatate (TESSALON) 200 MG capsule Take 1 capsule (200 mg total) by mouth 4 (four) times daily as needed for cough. (Patient taking differently: Take 200 mg by  mouth daily as needed for cough.) 120 capsule 2 11/26/2022   levonorgestrel (MIRENA, 52 MG,) 20 MCG/DAY IUD 1 each by Intrauterine route once.   unknown   ondansetron (ZOFRAN) 8 MG tablet Take 1 tablet (8 mg total) by mouth every 8 (eight) hours as needed for nausea or vomiting. 20 tablet 0 11/26/2022   oxyCODONE (ROXICODONE) 5 MG immediate release tablet Take 1 tablet (5 mg total) by mouth every 4 (four) hours as needed for severe pain. (Patient taking differently: Take 5 mg by mouth every 6 (six) hours as needed for severe pain.) 120 tablet 0 11/26/2022   Repotrectinib 40 MG CAPS Take 160 mg by mouth daily. 56 capsule 0 11/26/2022   Turmeric (QC TUMERIC COMPLEX PO) Take 500 mg by mouth daily.   11/26/2022   zinc sulfate 220 (50 Zn) MG capsule Take 220 mg by mouth daily.   11/26/2022   dexamethasone (DECADRON) 6 MG tablet Take 1 tablet (6 mg total) by mouth every morning. Take 1/2 (3mg ) tablet on 1/23. Then take 1 tablet (  6mg ) every AM starting on 1/24 or as directed. (Patient not taking: Reported on 11/27/2022) 10 tablet 0 Not Taking   folic acid (FOLVITE) 1 MG tablet Take 1 tablet (1 mg total) by mouth daily. (Patient not taking: Reported on 11/27/2022) 30 tablet 4 Not Taking   gabapentin (NEURONTIN) 100 MG capsule Take 100 mg by mouth 2 (two) times daily.  Start 100 mg twice a day x 3 days, if tolerating, increase to 200 mg twice a day. Take 2 capsules (200 mg total) by mouth 2 (two) times daily (Patient not taking: Reported on 11/27/2022)   Not Taking   Magnesium Oxide -Mg Supplement 400 MG CAPS Take 400 mg by mouth 2 (two) times daily. (Patient not taking: Reported on 11/27/2022) 60 capsule 0 Not Taking   metoCLOPramide (REGLAN) 5 MG tablet Take 1 tablet (5 mg total) by mouth every 6 (six) hours as needed for nausea. (Patient not taking: Reported on 11/27/2022) 30 tablet 1 Not Taking   ondansetron (ZOFRAN-ODT) 8 MG disintegrating tablet Take 1 tablet (8 mg total) by mouth every 8 (eight) hours as needed for nausea or  vomiting. (Patient not taking: Reported on 11/27/2022) 20 tablet 1 Completed Course   Ondansetron 4 MG FILM Take 4-8 mg by mouth every 6 (six) hours as needed. (Patient not taking: Reported on 11/27/2022) 15 each 0 Completed Course   Scheduled:   aspirin EC  81 mg Oral Daily   clopidogrel  75 mg Oral Daily   magnesium oxide  400 mg Oral BID   pantoprazole  40 mg Oral Daily   repotrectinib  160 mg Oral Daily   Rivaroxaban  15 mg Oral BID WC   Followed by   Derrill Memo ON 12/19/2022] rivaroxaban  20 mg Oral Q supper   senna-docusate  2 tablet Oral QHS   sodium chloride flush  3 mL Intravenous Q12H   Infusions:   sodium chloride     heparin 1,400 Units/hr (11/28/22 1518)    Assessment: 37yo female admitted with subacute thrombosis of left profunda and popliteal arteries, now s/p thrombectomy, has been on UFH therapy, to transition to Xarelto.  Plan:  Stop heparin with first [place] dose. Xarelto 15mg  PO BID x21d followed by 15mg  daily.  Wynona Neat, PharmD, BCPS  11/28/2022,5:26 PM

## 2022-11-28 NOTE — Progress Notes (Addendum)
ANTICOAGULATION CONSULT NOTE - Follow Up Consult  Pharmacy Consult for Heparin Indication: lower extremity arterial occlusion  Allergies  Allergen Reactions   Hydrocodone Nausea Only    Dizzy, "crawl out of my skin"   Tramadol Other (See Comments)    "Weird feeling"    Patient Measurements: Height: 5\' 6"  (167.6 cm) Weight: 59 kg (130 lb 1.1 oz) IBW/kg (Calculated) : 59.3 Heparin Dosing Weight: 59 kg  Vital Signs: Temp: 98.1 F (36.7 C) (02/08 0824) Temp Source: Oral (02/08 0824) BP: 111/72 (02/08 0824) Pulse Rate: 105 (02/08 0824)  Labs: Recent Labs    11/26/22 1158 11/26/22 2024 11/27/22 0155 11/27/22 1004 11/27/22 2150 11/28/22 0108  HGB 12.5 11.3*  --   --   --  9.1*  HCT 36.7 34.3*  --   --   --  26.2*  PLT 306 293  --   --   --  312  LABPROT  --  13.0  --   --   --   --   INR  --  1.0  --   --   --   --   HEPARINUNFRC  --   --    < > 0.26* 0.25* 0.45  CREATININE 0.73 0.82  --   --   --   --   CKTOTAL  --   --   --   --   --  27*   < > = values in this interval not displayed.     Estimated Creatinine Clearance: 88.3 mL/min (by C-G formula based on SCr of 0.82 mg/dL).  Assessment: 37yo female admitted by vascular surgery 11/26/22 for LE ischemia causing pain at rest in LLE and claudication in RLE. Has stage IV lung Ca without other risk factors so suspected to be cardioembolic or thrombotic. S/p angiography, thrombectomy and atherectomy on 2/7.  Heparin drip resumed 2 hrs after procedure.  Groin site noted without bleeding or hematoma.  Heparin level is therapeutic at 0.45 on 1450 units/hr. No bleeding noted, Hgb down to 9.1 post-op, platelets are normal.  Goal of Therapy:  Heparin level 0.3-0.7 units/ml Monitor platelets by anticoagulation protocol: Yes   Plan:  Continue heparin infusion at 1450 units/hr Confirmatory heparin level ~09:30  Daily heparin level and CBC Monitor for s/sx of bleeding  Thank you for involving pharmacy in this patient's  care.  Renold Genta, PharmD, BCPS Clinical Pharmacist Clinical phone for 11/28/2022 is [phone] 11/28/2022 8:33 AM  Addendum: Confirmatory heparin level is therapeutic at 0.64 on 1450 units/hr but appears to be trending up  Decrease heparin infusion to 1400 units/hr F/U am labs  [facility], PharmD, BCPS 10:41 AM

## 2022-11-29 ENCOUNTER — Other Ambulatory Visit (HOSPITAL_COMMUNITY): Payer: Self-pay

## 2022-11-29 ENCOUNTER — Telehealth: Payer: Self-pay | Admitting: Pulmonary Disease

## 2022-11-29 ENCOUNTER — Telehealth: Payer: Self-pay

## 2022-11-29 DIAGNOSIS — I513 Intracardiac thrombosis, not elsewhere classified: Secondary | ICD-10-CM

## 2022-11-29 DIAGNOSIS — C3490 Malignant neoplasm of unspecified part of unspecified bronchus or lung: Secondary | ICD-10-CM

## 2022-11-29 DIAGNOSIS — R000 Tachycardia, unspecified: Secondary | ICD-10-CM | POA: Diagnosis not present

## 2022-11-29 DIAGNOSIS — I70229 Atherosclerosis of native arteries of extremities with rest pain, unspecified extremity: Secondary | ICD-10-CM

## 2022-11-29 MED ORDER — RIVAROXABAN 20 MG PO TABS: 20.0000 mg | ORAL_TABLET | Freq: Every day | ORAL | 3 refills | Status: DC

## 2022-11-29 MED ORDER — CLOPIDOGREL BISULFATE 75 MG PO TABS: 75.0000 mg | ORAL_TABLET | Freq: Every day | ORAL | 11 refills | Status: DC

## 2022-11-29 MED ORDER — ASPIRIN 81 MG PO TBEC: 81.0000 mg | DELAYED_RELEASE_TABLET | Freq: Every day | ORAL | 12 refills | Status: DC

## 2022-11-29 MED ORDER — RIVAROXABAN 15 MG PO TABS: 15.0000 mg | ORAL_TABLET | Freq: Two times a day (BID) | ORAL | 0 refills | Status: DC

## 2022-11-29 NOTE — Consult Note (Addendum)
Cardiology Consultation:   Patient ID: Melissa Pineda MRN: [id number]; DOB: November 07, 1985  Admit date: 11/26/2022 Date of Consult: 11/29/2022  Primary Care Provider: Patient, No Pcp [facility] Cardiologist: None  [facility] Electrophysiologist:  None    Patient Profile:   Melissa Pineda is a 37 y.o. female   Who is being seen today for the evaluation of ith a hx of anxiety disorder who was diagnosed with pneumonia after presenting to the emergency room in January.  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 Luan Pulling.  History of Present Illness:   Melissa Pineda with a hx of anxiety disorder who was diagnosed with pneumonia after presenting to the emergency room in January.  She was placed on doxycycline.  She returned to the emergency room on 11/04/2022 with worsening shortness of breath and fever.  She complained of pleuritic chest pain 9 out of 10 on the left side.  In the ER she was found to have lingular pneumonia with near complete opacification of the area as well as a large left-sided pleural effusion felt to be parapneumonic and reactive lymphadenopathy.  She was mated for community-acquired pneumonia that had failed outpatient treatment.  She was started on IV azithromycin and Rocephin.  She underwent left thoracentesis removing almost 2 L of blood-tinged cloudy fluid.  Chest CT demonstrated mediastinal lymphadenopathy, large left pleural effusion with near complete atelectasis of the lingula and left lower lobe and dependent groundglass opacity in left upper lobe.  She is also had complained of night sweats for several weeks.  She also underwent chest tube placement for drainage of fluid.  Unfortunately fluid came back  malignant likely lung primary.  Oncology consulted and diagnosed with stage IVa non-small cell lung cancer and large malignant pleural effusion with mediastinal lymphadenopathy.  The patient has never smoked.  Unfortunately had reaccumulation of fluid after removal of initial chest tube and had to have a Pleurx catheter placed.  She was subsequently discharged home on January 20.  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  She was started on induction chemotherapy with carboplatinum, Alimta to an Avastin until molecular studies come back.  She was seen at [facility] and ultimately was started on Reporectinib.    She called into pulmonary on 11/25/2022 complaining of bilateral lower leg pain at rest in the left lower extremity and claudication in the right lower extremity.  Arterial duplex showed a left profound occlusion, bilateral popliteal occlusions, bilateral tibial occlusions and was admitted to the hospital immediately with critical limb ischemia.  She underwent left lower extremity angiogram and mechanical thrombectomy of the left profunda femoris artery, mechanical thrombectomy of the left popliteal artery and laser atherectomy and angioplasty of the left anterior tibial artery.   Chest CTA was repeated showing a thrombus within the lateral aspect of the left atrium between the left pulmonary vein ostia and possible cortical infarct in the upper pole right kidney.  Cardiology is now consulted for further evaluation  Patient denies  any history of heart arrhythmias including atrial fibrillation.  She is hypercoagulable given her underlying adeno CA.    Past Medical History:  Diagnosis Date   Anxiety    Phreesia  11/21/2020   Cancer [facility])     Past Surgical History:  Procedure Laterality Date   ABDOMINAL AORTOGRAM W/LOWER EXTREMITY N/A 11/27/2022   Procedure: ABDOMINAL AORTOGRAM W/LOWER EXTREMITY;  Surgeon: Cherre Robins, MD;  Location: [facility];  Service: Cardiovascular;  Laterality: N/A;   IR PERC PLEURAL DRAIN W/INDWELL CATH W/IMG GUIDE  11/09/2022   PERIPHERAL VASCULAR ATHERECTOMY  11/27/2022   Procedure: PERIPHERAL VASCULAR ATHERECTOMY;  Surgeon: Cherre Robins, MD;  Location: [place] [facility];  Service: Cardiovascular;;   PERIPHERAL VASCULAR THROMBECTOMY  11/27/2022   Procedure: PERIPHERAL VASCULAR THROMBECTOMY;  Surgeon: Cherre Robins, MD;  Location: [place] [facility];  Service: Cardiovascular;;   WISDOM TOOTH EXTRACTION       Home Medications:  Prior to Admission medications   Medication Sig Start Date End Date Taking? Authorizing Provider  ALPRAZolam Duanne Moron) 0.5 MG tablet Take 1 tablet (0.5 mg total) by mouth 3 (three) times daily as needed for anxiety. Patient taking differently: Take 0.5 mg by mouth daily as needed for anxiety. 11/09/22  Yes Nita Sells, MD  benzonatate (TESSALON) 200 MG capsule Take 1 capsule (200 mg total) by mouth 4 (four) times daily as needed for cough. Patient taking differently: Take 200 mg by mouth daily as needed for cough. 11/20/22  Yes Acquanetta Chain, DO  levonorgestrel (MIRENA, 52 MG,) 20 MCG/DAY IUD 1 each by Intrauterine route once. 12/18/21  Yes [provider]  ondansetron (ZOFRAN) 8 MG tablet Take 1 tablet (8 mg total) by mouth every 8 (eight) hours as needed for nausea or vomiting. 11/12/22  Yes Curt Bears, MD  oxyCODONE (ROXICODONE) 5 MG immediate release tablet Take 1 tablet (5 mg total) by mouth every 4 (four) hours as needed for severe pain. Patient taking differently: Take 5 mg by mouth every 6 (six) hours as needed for severe pain. 11/20/22  Yes Lane Hacker L, DO  Repotrectinib 40 MG CAPS Take 160 mg by  mouth daily. 11/20/22  Yes Curt Bears, MD  Turmeric (QC TUMERIC COMPLEX PO) Take 500 mg by mouth daily.   Yes [provider]  zinc sulfate 220 (50 Zn) MG capsule Take 220 mg by mouth daily.   Yes [provider]  aspirin EC 81 MG tablet Take 1 tablet (81 mg total) by mouth daily. Swallow whole. 11/29/22   Ulyses Amor, PA-C  clopidogrel (PLAVIX) 75 MG tablet Take 1 tablet (75 mg total) by mouth daily. 11/29/22   Ulyses Amor, PA-C  dexamethasone (DECADRON) 6 MG tablet Take 1 tablet (6 mg total) by mouth every morning. Take 1/2 (3mg ) tablet on 1/23. Then take 1 tablet (6mg ) every AM starting on 1/24 or as directed. Patient not taking: Reported on 11/27/2022 11/12/22   Acquanetta Chain, DO  folic acid (FOLVITE) 1 MG tablet Take 1 tablet (1 mg total) by mouth daily. Patient not taking: Reported on 11/27/2022 11/12/22   Curt Bears, MD  gabapentin (NEURONTIN) 100 MG capsule Take 100 mg by mouth 2 (two) times daily.  Start 100 mg twice a day x 3 days, if tolerating, increase to 200 mg twice a day. Take 2 capsules (200 mg total) by mouth 2 (two) times daily Patient not taking: Reported on 11/27/2022 11/19/22   [provider]  Magnesium  Oxide -Mg Supplement 400 MG CAPS Take 400 mg by mouth 2 (two) times daily. Patient not taking: Reported on 11/27/2022 11/12/22   Acquanetta Chain, DO  metoCLOPramide (REGLAN) 5 MG tablet Take 1 tablet (5 mg total) by mouth every 6 (six) hours as needed for nausea. Patient not taking: Reported on 11/27/2022 11/18/22   Lane Hacker L, DO  ondansetron (ZOFRAN-ODT) 8 MG disintegrating tablet Take 1 tablet (8 mg total) by mouth every 8 (eight) hours as needed for nausea or vomiting. Patient not taking: Reported on 11/27/2022 11/15/22   Parrett, Fonnie Mu, NP  Ondansetron 4 MG FILM Take 4-8 mg by mouth every 6 (six) hours as needed. Patient not taking: Reported on 11/27/2022 11/12/22   Acquanetta Chain, DO  Rivaroxaban (XARELTO) 15 MG TABS  tablet Take 1 tablet (15 mg total) by mouth 2 (two) times daily with a meal. 11/29/22   Ulyses Amor, PA-C  rivaroxaban (XARELTO) 20 MG TABS tablet Take 1 tablet (20 mg total) by mouth daily with supper. 12/19/22   Ulyses Amor, PA-C    Inpatient Medications: Scheduled Meds:  aspirin EC  81 mg Oral Daily   clopidogrel  75 mg Oral Daily   magnesium oxide  400 mg Oral BID   pantoprazole  40 mg Oral Daily   repotrectinib  160 mg Oral Daily   Rivaroxaban  15 mg Oral BID WC   Followed by   Derrill Memo ON 12/19/2022] rivaroxaban  20 mg Oral Q supper   senna-docusate  2 tablet Oral QHS   sodium chloride flush  3 mL Intravenous Q12H   Continuous Infusions:  sodium chloride     PRN Meds: sodium chloride, acetaminophen, ALPRAZolam, diazepam, hydrALAZINE, HYDROmorphone (DILAUDID) injection, labetalol, ondansetron (ZOFRAN) IV, oxyCODONE, sodium chloride flush  Allergies:    Allergies  Allergen Reactions   Hydrocodone Nausea Only    Dizzy, "crawl out of my skin"   Tramadol Other (See Comments)    "Weird feeling"    Social History:   Social History   Socioeconomic History   Marital status: Married    Spouse name: Not on file   Number of children: Not on file   Years of education: Not on file   Highest education level: Not on file  Occupational History   Not on file  Tobacco Use   Smoking status: Never   Smokeless tobacco: Never  Vaping Use   Vaping Use: Never used  Substance and Sexual Activity   Alcohol use: Yes    Alcohol/week: 7.0 standard drinks of alcohol    Types: 7 Glasses of wine per week    Comment: 1 glass wine in the evening    Drug use: Yes    Types: Marijuana    Comment: occasional   Sexual activity: Yes    Birth control/protection: None  Other Topics Concern   Not on file  Social History Narrative   Not on file   Social Determinants of Health   Financial Resource Strain: Not on file  Food Insecurity: No Food Insecurity (11/26/2022)   Hunger Vital Sign     Worried About Running Out of Food in the Last Year: Never true    Ran Out of Food in the Last Year: Never true  Transportation Needs: No Transportation Needs (11/26/2022)   PRAPARE - Hydrologist (Medical): No    Lack of Transportation (Non-Medical): No  Physical Activity: Not on file  Stress: Not on file  Social  Connections: Not on file  Intimate Partner Violence: Not At Risk (11/26/2022)   Humiliation, Afraid, Rape, and Kick questionnaire    Fear of Current or Ex-Partner: No    Emotionally Abused: No    Physically Abused: No    Sexually Abused: No    Family History:    Family History  Problem Relation Age of Onset   Cancer Mother    Cancer Maternal Grandmother    Cancer Maternal Grandfather    Hypertension Paternal Grandmother    Cancer Paternal Grandfather      ROS:  Please see the history of present illness.   All other ROS reviewed and negative.     Physical Exam/Data:   Vitals:   11/28/22 2334 11/29/22 0000 11/29/22 0428 11/29/22 1325  BP: 102/68  (!) 113/58 125/85  Pulse: (!) 110  (!) 106 (!) 110  Resp: 15 16 16 20   Temp: 98.2 F (36.8 C)  98.4 F (36.9 C) 98.2 F (36.8 C)  TempSrc: Oral  Oral Oral  SpO2: 96%  98% 100%  Weight:      Height:       No intake or output data in the 24 hours ending 11/29/22 1336    11/26/2022    7:00 PM 11/26/2022    1:04 PM 11/20/2022    9:36 AM  Last 3 Weights  Weight (lbs) 130 lb 1.1 oz 130 lb 130 lb 3.2 oz  Weight (kg) 59 kg 58.968 kg 59.058 kg     Body mass index is 20.99 kg/m.  General:  Well nourished, well developed, in no acute distress HEENT: normal Lymph: no adenopathy Neck: no JVD Endocrine:  No thryomegaly Vascular: No carotid bruits; FA pulses 2+ bilaterally without bruits  Cardiac:  normal S1, S2; tachycardic and regular; no murmur  Lungs:  clear to auscultation bilaterally, no wheezing, rhonchi or rales  Abd: soft, nontender, no hepatomegaly  Ext: no edema Musculoskeletal:  No  deformities, BUE and BLE strength normal and equal Skin: warm and dry  Neuro:  CNs 2-12 intact, no focal abnormalities noted Psych:  Normal affect   EKG:  The EKG was personally reviewed and demonstrates: Sinus tachycardia at 129 bpm from EKG 11/05/2022.  No EKG done this admission Telemetry:  Telemetry was personally reviewed and demonstrates: Sinus tachycardia 120 bpm  Relevant CV Studies: 2D echo 11/29/22 IMPRESSIONS    1. Left ventricular ejection fraction, by estimation, is 50 to 55%. The  left ventricle has low normal function. The left ventricle has no regional  wall motion abnormalities. Left ventricular diastolic parameters were  normal.   2. Right ventricular systolic function is normal. The right ventricular  size is normal.   3. The mitral valve is normal in structure. No evidence of mitral valve  regurgitation. No evidence of mitral stenosis.   4. The aortic valve was not well visualized. Aortic valve regurgitation  is not visualized. No aortic stenosis is present.   5. The inferior vena cava is normal in size with greater than 50%  respiratory variability, suggesting right atrial pressure of 3 mmHg.   Laboratory Data:  High Sensitivity Troponin:   Recent Labs  Lab 11/04/22 1153  TROPONINIHS 5     Chemistry Recent Labs  Lab 11/26/22 1158 11/26/22 2024  NA 135 132*  K 4.3 4.0  CL 100 99  CO2 30 25  GLUCOSE 89 99  BUN 10 16  CREATININE 0.73 0.82  CALCIUM 8.6* 8.2*  GFRNONAA >60 >60  ANIONGAP 5  8    Recent Labs  Lab 11/26/22 1158 11/26/22 2024  PROT 5.7* 5.3*  ALBUMIN 3.1* 2.5*  AST 21 27  ALT 30 35  ALKPHOS 71 65  BILITOT 0.4 0.2*   Hematology Recent Labs  Lab 11/26/22 1158 11/26/22 2024 11/28/22 0108  WBC 16.5* 14.7* 10.2  RBC 4.34 3.99 3.13*  HGB 12.5 11.3* 9.1*  HCT 36.7 34.3* 26.2*  MCV 84.6 86.0 83.7  MCH 28.8 28.3 29.1  MCHC 34.1 32.9 34.7  RDW 13.9 13.8 13.9  PLT 306 293 312   BNPNo results for input(s): "BNP", "PROBNP" in the  last 168 hours.  DDimer No results for input(s): "DDIMER" in the last 168 hours.   Radiology/Studies:  CT ANGIO CHEST AORTA W/ & OR WO/CM & GATING ([facility] ONLY)  Result Date: 11/28/2022 CLINICAL DATA:  Thrombosis of lower extremities. Evaluate source. Known metastatic left lower lobe lung cancer EXAM: CT ANGIOGRAPHY CHEST WITHOUT AND WITH CONTRAST TECHNIQUE: Multidetector CT imaging of the chest was performed using the standard protocol before and after bolus administration of intravenous contrast. Multiplanar CT image reconstructions and MIPs were obtained to evaluate the vascular anatomy. RADIATION DOSE REDUCTION: This exam was performed according to the departmental dose-optimization program which includes automated exposure control, adjustment of the mA and/or kV according to patient size and/or use of iterative reconstruction technique. CONTRAST:  94mL OMNIPAQUE IOHEXOL 350 MG/ML SOLN COMPARISON:  11/26/2022, 11/15/2022 FINDINGS: Cardiovascular: The heart is mildly enlarged, with prominent biventricular dilatation. There is adherent thrombus within the left atrium seen between the ostia of the left pulmonary veins, measuring 1.3 x 0.8 cm. This is the likely source for lower extremity emboli. The thoracic aorta is unremarkable without aneurysm or dissection. Opacification of the pulmonary vasculature is suboptimal due to timing of contrast bolus. There is no large central pulmonary embolus. Mediastinum/Nodes: Diffuse lymphadenopathy throughout the mediastinum and left hilar regions consistent with the metabolically active adenopathy on recent PET scan. No significant change. Thyroid, trachea, and esophagus are grossly unremarkable. Lungs/Pleura: Masslike left lower lobe consolidation seen on prior exams is again identified, consistent with known history of left lower lobe adenocarcinoma. The circumferential left pleural thickening seen on prior exams is again identified. Decreased left pleural fluid  with indwelling left pleural drain. Stable nodularity within the right middle and right lower lobe consistent with contralateral pulmonary metastases. There are no new areas of consolidation within either lung. No pneumothorax. Central airways are patent. Upper Abdomen: There is a small area of decreased cortical enhancement within the upper pole right kidney, incompletely imaged due to slice selection. Renal infarct cannot be excluded in light of the left atrial thrombus described above and history of lower extremity thromboembolic disease. No other acute upper abdominal findings. Musculoskeletal: No acute or destructive bony lesions. Reconstructed images demonstrate no additional findings. Review of the MIP images confirms the above findings. IMPRESSION: 1. Thrombus within the lateral aspect of the left atrium between the left pulmonary vein ostia. 2. Stable findings of metastatic lung cancer, with primary left lower lobe masslike consolidation, left-sided pleural thickening, mediastinal and hilar adenopathy, and right pulmonary nodules as seen on recent PET scan. 3. Decreased left pleural effusion with indwelling left pleural drain. 4. Possible cortical infarct upper pole right kidney, incompletely evaluated on this study. Critical Value/emergent results were called by telephone at the time of interpretation on 11/28/2022 at 8:29 pm to provider DR ROBBINS, who verbally acknowledged these results. Electronically Signed   By: Diana Eves.D.  On: 11/28/2022 20:40   PERIPHERAL VASCULAR CATHETERIZATION  Result Date: 11/27/2022 DATE OF SERVICE: 11/27/2022  PATIENT:  Amela Parlow  37 y.o. female  PRE-OPERATIVE DIAGNOSIS: subacute thrombosis of left profunda and popliteal arteries causing ischemic rest pain  POST-OPERATIVE DIAGNOSIS:  Same  PROCEDURE:  1) Ultrasound guided right common femoral artery access 2) Aortogram 3) left lower extremity angiogram with third order cannulation 4) mechanical thrombectomy of left  profunda femoris artery (8 [place] JETi) 5) mechanical thrombectomy of left popliteal artery  (6 [place] JETi) 6) laser atherectomy and angioplasty (3 x 224mm) of left anterior tibial artery 7) Conscious sedation (129 minutes)   SURGEON:  Yevonne Aline. Stanford Breed, MD  ASSISTANT: none  ANESTHESIA:   local and IV sedation  ESTIMATED BLOOD LOSS: 722mL  LOCAL MEDICATIONS USED:  LIDOCAINE  COUNTS: confirmed correct.  PATIENT DISPOSITION:  PACU - hemodynamically stable.  Delay start of Pharmacological VTE agent (>24hrs) due to surgical blood loss or risk of bleeding: no  INDICATION FOR PROCEDURE: Maricela Schreur is a 37 y.o. female with occlusion of left profunda femoris and popliteal arteries causing ischemic rest pain.  I felt this was likely subacute given the weeks long onset of symptoms.  Preoperative duplex suggested left profunda femoris and popliteal artery occlusions. After careful discussion of risks, benefits, and alternatives the patient was offered angiography with possible intervention. The patient understood and wished to proceed.  OPERATIVE FINDINGS: Terminal aorta and iliac arteries: Widely patent without flow-limiting stenosis  Left lower extremity: Common femoral artery: Healthy and widely patent Profunda femoris artery: Occluded at its origin.  Reconstitution at second-order branching in the mid thigh. Superficial femoral artery: Healthy and widely patent Popliteal artery: Above-knee segment healthy and widely patent.  Artery abruptly occludes behind the knee.  Robust collateralization around the popliteal occlusion suggesting chronicity to the occlusion.  Anterior tibial artery: Reconstitutes several centimeters distal to its origin from geniculate collaterals Tibioperoneal trunk: Occluded Peroneal artery:  Reconstitutes several centimeters distal to its origin from geniculate collaterals Posterior tibial artery:  Reconstitutes several centimeters distal to its origin from geniculate collaterals. Pedal  circulation: Fills via collaterals  DESCRIPTION OF PROCEDURE: After identification of the patient in the pre-operative holding area, the patient was transferred to the operating room. The patient was positioned supine on the operating room table.  Anesthesia was induced. The groins was prepped and draped in standard fashion. A surgical pause was performed confirming correct patient, procedure, and operative location.  The right groin was anesthetized with subcutaneous injection of 1% lidocaine. Using ultrasound guidance, the right common femoral artery was accessed with micropuncture technique. Fluoroscopy was used to confirm cannulation over the femoral head. The 50F sheath was upsized to 42F.  A Benson wire was advanced into the distal aorta. Over the wire an omni flush catheter was advanced to the level of L2. Aortogram was performed - see above for details.  The left common iliac artery was selected with an omniflush catheter and Glidewire advantage guidewire. The wire was advanced into the common femoral artery. Over the wire the omni flush catheter was advanced into the external iliac artery. Selective angiography was performed - see above for details.  The decision was made to intervene. The patient was heparinized with 7000 units of heparin. The 42F sheath was exchanged for a 8 [place] by 45 cm sheath. Selective angiography of the left lower extremity was performed prior to intervention.  The lesions were treated with: mechanical thrombectomy of left profunda femoris artery (8 [place] JETi)  mechanical thrombectomy of left popliteal artery  (6 [place] JETi) laser atherectomy and angioplasty (3 x 258mm) of left anterior tibial artery  Completion angiography revealed: Profunda femoris thrombosis resolved Popliteal artery occlusion recanalized with combination of thrombectomy, angioplasty, and arthrectomy. Not able to reestablish inline flow because of significant thrombus in the ankle and foot. The foot does fill in  some of the native vessels do opacify with contrast.   A Perclose device was used to close the arteriotomy. Hemostasis was excellent upon completion.  Conscious sedation was administered with the use of IV fentanyl and midazolam under continuous physician and nurse monitoring.  Heart rate, blood pressure, and oxygen saturation were continuously monitored.  Total sedation time was 129 minutes  Upon completion of the case instrument and sharps counts were confirmed correct. The patient was transferred to the PACU in good condition. I was present for all portions of the procedure.  PLAN: Resume heparin.  Monitor for clinical improvement.  Will reevaluate in the morning.  If foot pain persists, will proceed to the OR for operative thrombectomy.  Yevonne Aline. Stanford Breed, MD [facility] Phone Number: [phone] 11/27/2022 1:11 PM    ECHOCARDIOGRAM COMPLETE  Result Date: 11/27/2022    ECHOCARDIOGRAM REPORT   Patient Name:   RYLIN SAEZ Date of Exam: 11/27/2022 Medical Rec #:  [id number]    Height:       66.0 in Accession #:    [id number]   Weight:       130.1 lb Date of Birth:  1985/10/22     BSA:          1.666 m Patient Age:    37 years     BP:           125/74 mmHg Patient Gender: F            HR:           110 bpm. Exam Location:  Inpatient Procedure: 2D Echo, Cardiac Doppler and Color Doppler Indications:    PAD  History:        Patient has no prior history of Echocardiogram examinations.                 Lung cancer.  Sonographer:    Clayton Lefort Referring Phys: [id number] [place]  1. Left ventricular ejection fraction, by estimation, is 50 to 55%. The left ventricle has low normal function. The left ventricle has no regional wall motion abnormalities. Left ventricular diastolic parameters were normal.  2. Right ventricular systolic function is normal. The right ventricular size is normal.  3. The mitral valve is normal in structure. No evidence of mitral valve  regurgitation. No evidence of mitral stenosis.  4. The aortic valve was not well visualized. Aortic valve regurgitation is not visualized. No aortic stenosis is present.  5. The inferior vena cava is normal in size with greater than 50% respiratory variability, suggesting right atrial pressure of 3 mmHg. FINDINGS  Left Ventricle: Left ventricular ejection fraction, by estimation, is 50 to 55%. The left ventricle has low normal function. The left ventricle has no regional wall motion abnormalities. The left ventricular internal cavity size was normal in size. There is no left ventricular hypertrophy. Left ventricular diastolic parameters were normal. Right Ventricle: The right ventricular size is normal. No increase in right ventricular wall thickness. Right ventricular systolic function is normal. Left Atrium: Left atrial size was normal in size. Right Atrium: Right atrial  size was normal in size. Pericardium: There is no evidence of pericardial effusion. Mitral Valve: The mitral valve is normal in structure. No evidence of mitral valve regurgitation. No evidence of mitral valve stenosis. Tricuspid Valve: The tricuspid valve is normal in structure. Tricuspid valve regurgitation is trivial. Aortic Valve: The aortic valve was not well visualized. Aortic valve regurgitation is not visualized. No aortic stenosis is present. Aortic valve mean gradient measures 3.0 mmHg. Aortic valve peak gradient measures 4.8 mmHg. Aortic valve area, by VTI measures 2.83 cm. Pulmonic Valve: The pulmonic valve was not well visualized. Pulmonic valve regurgitation is trivial. Aorta: The aortic root and ascending aorta are structurally normal, with no evidence of dilitation. Venous: The inferior vena cava is normal in size with greater than 50% respiratory variability, suggesting right atrial pressure of 3 mmHg. IAS/Shunts: The interatrial septum was not well visualized.  LEFT VENTRICLE PLAX 2D LVIDd:         4.40 cm LVIDs:         3.20 cm  LV PW:         0.90 cm LV IVS:        0.70 cm LVOT diam:     2.10 cm LV SV:         50 LV SV Index:   30 LVOT Area:     3.46 cm  RIGHT VENTRICLE             IVC RV Basal diam:  2.40 cm     IVC diam: 1.20 cm RV S prime:     12.10 cm/s TAPSE (M-mode): 2.2 cm LEFT ATRIUM             Index        RIGHT ATRIUM          Index LA diam:        2.20 cm 1.32 cm/m   RA Area:     7.89 cm LA Vol (A2C):   28.1 ml 16.87 ml/m  RA Volume:   13.40 ml 8.05 ml/m LA Vol (A4C):   30.6 ml 18.37 ml/m LA Biplane Vol: 31.8 ml 19.09 ml/m  AORTIC VALVE AV Area (Vmax):    2.76 cm AV Area (Vmean):   2.74 cm AV Area (VTI):     2.83 cm AV Vmax:           110.00 cm/s AV Vmean:          82.300 cm/s AV VTI:            0.175 m AV Peak Grad:      4.8 mmHg AV Mean Grad:      3.0 mmHg LVOT Vmax:         87.70 cm/s LVOT Vmean:        65.200 cm/s LVOT VTI:          0.143 m LVOT/AV VTI ratio: 0.82  AORTA Ao Root diam: 3.30 cm Ao Asc diam:  2.70 cm  SHUNTS Systemic VTI:  0.14 m Systemic Diam: 2.10 cm Oswaldo Milian MD Electronically signed by Oswaldo Milian MD Signature Date/Time: 11/27/2022/6:00:10 PM    Final    VAS [place] LOWER EXTREMITY ARTERIAL DUPLEX  Result Date: 11/26/2022 LOWER EXTREMITY ARTERIAL DUPLEX STUDY Patient Name:  NIANI MOURER  Date of Exam:   11/26/2022 Medical Rec #: [id number]     Accession #:    [id number] Date of Birth: [birth year]      Patient Gender: F Patient Age:   89 years Exam  Location:  [facility] Procedure:      VAS [place] LOWER EXTREMITY ARTERIAL DUPLEX Referring Phys: [facility] MOHAMED --------------------------------------------------------------------------------  Indications: Incidental finding on lower extremity venous examination- bilateral              poplital artery occlusion in the setting of pain for 1-2 weeks. New              paresthesia sensation- left worse than right. Other Factors: Stage 4 lung cancer.  Current ABI: N/A Comparison Study: No prior studies. Performing Technologist: Darlin Coco  RDMS RVT  Examination Guidelines: A complete evaluation includes B-mode imaging, spectral Doppler, color Doppler, and power Doppler as needed of all accessible portions of each vessel. Bilateral testing is considered an integral part of a complete examination. Limited examinations for reoccurring indications may be performed as noted.  +-----------+--------+-----+--------+---------+-----------+ RIGHT      PSV cm/sRatioStenosisWaveform Comments    +-----------+--------+-----+--------+---------+-----------+ CFA Prox   74                   triphasic            +-----------+--------+-----+--------+---------+-----------+ CFA Mid    61                   triphasic            +-----------+--------+-----+--------+---------+-----------+ CFA Distal 59                   triphasic            +-----------+--------+-----+--------+---------+-----------+ DFA        74                   triphasic            +-----------+--------+-----+--------+---------+-----------+ SFA Prox   69                   triphasic            +-----------+--------+-----+--------+---------+-----------+ SFA Mid    76                   triphasic            +-----------+--------+-----+--------+---------+-----------+ SFA Distal 48                   triphasic            +-----------+--------+-----+--------+---------+-----------+ POP Prox   33                   triphasic            +-----------+--------+-----+--------+---------+-----------+ POP Mid                 occluded                     +-----------+--------+-----+--------+---------+-----------+ POP Distal              occluded                     +-----------+--------+-----+--------+---------+-----------+ TP Trunk                occluded                     +-----------+--------+-----+--------+---------+-----------+ ATA Prox   22                   biphasic              +-----------+--------+-----+--------+---------+-----------+  ATA Mid    16                   biphasic             +-----------+--------+-----+--------+---------+-----------+ ATA Distal 15                   biphasic             +-----------+--------+-----+--------+---------+-----------+ PTA Prox   12                   biphasic Collaterals +-----------+--------+-----+--------+---------+-----------+ PTA Mid    27                   biphasic             +-----------+--------+-----+--------+---------+-----------+ PTA Distal 27                   biphasic             +-----------+--------+-----+--------+---------+-----------+ PERO Prox               occluded                     +-----------+--------+-----+--------+---------+-----------+ PERO Mid                occluded                     +-----------+--------+-----+--------+---------+-----------+ PERO Distal             occluded                     +-----------+--------+-----+--------+---------+-----------+ DP         8                    biphasic             +-----------+--------+-----+--------+---------+-----------+  +-----------+--------+-----+--------+----------+--------------------+ LEFT       PSV cm/sRatioStenosisWaveform  Comments             +-----------+--------+-----+--------+----------+--------------------+ CFA Prox   57                   triphasic                      +-----------+--------+-----+--------+----------+--------------------+ CFA Mid    67                   triphasic                      +-----------+--------+-----+--------+----------+--------------------+ CFA Distal 71                   triphasic                      +-----------+--------+-----+--------+----------+--------------------+ DFA                     occluded                               +-----------+--------+-----+--------+----------+--------------------+ SFA Prox   69                    triphasic                      +-----------+--------+-----+--------+----------+--------------------+ SFA Mid    92  triphasic                      +-----------+--------+-----+--------+----------+--------------------+ SFA Distal 42                   triphasic                      +-----------+--------+-----+--------+----------+--------------------+ POP Prox   37                   triphasic                      +-----------+--------+-----+--------+----------+--------------------+ POP Mid                 occluded                               +-----------+--------+-----+--------+----------+--------------------+ POP Distal              occluded                               +-----------+--------+-----+--------+----------+--------------------+ TP Trunk                occluded                               +-----------+--------+-----+--------+----------+--------------------+ ATA Prox   41                   biphasic                       +-----------+--------+-----+--------+----------+--------------------+ ATA Mid    22                   biphasic                       +-----------+--------+-----+--------+----------+--------------------+ ATA Distal              occluded                               +-----------+--------+-----+--------+----------+--------------------+ PTA Prox                occluded          Collaterals prox-mid +-----------+--------+-----+--------+----------+--------------------+ PTA Mid    25                   monophasic                     +-----------+--------+-----+--------+----------+--------------------+ PTA Distal 23                   monophasic                     +-----------+--------+-----+--------+----------+--------------------+ PERO Prox               occluded                               +-----------+--------+-----+--------+----------+--------------------+ PERO Mid                 occluded                               +-----------+--------+-----+--------+----------+--------------------+  PERO Distal25                             Collateral           +-----------+--------+-----+--------+----------+--------------------+ DP                      occluded                               +-----------+--------+-----+--------+----------+--------------------+  Summary: Right: Total occlusion noted in the popliteal artery and extending into the tibioperoneal trunk. Total occlusion noted in the peroneal artery. Absent great toe waveform. Left: Total occlusion noted in the deep femoral artery. Total occlusion noted in the mid to distal popliteal artery and extending into the tibioperoneal trunk. Total occlusion noted in the distal segment of the anterior tibial artery. Total occlusion noted in the peroneal artery. Total occlusion noted in the dorsal pedis artery. Dampened great toe waveform.  See table(s) above for measurements and observations. Electronically signed by Harold Barban MD on 11/26/2022 at 11:48:53 PM.    Final    VAS [place] LOWER EXTREMITY VENOUS (DVT)  Result Date: 11/26/2022  Lower Venous DVT Study Patient Name:  ASYA DERRYBERRY  Date of Exam:   11/26/2022 Medical Rec #: [id number]     Accession #:    [id number] Date of Birth: 1986/07/03      Patient Gender: F Patient Age:   14 years Exam Location:  [facility] Procedure:      VAS [place] LOWER EXTREMITY VENOUS (DVT) Referring Phys: [facility] MOHAMED --------------------------------------------------------------------------------  Indications: Pain in bilateral lower extremities. Left worse than right. Patient reports pain for approximately 1-2 weeks, described as "shin splint" sensation, pain behind knees, and new numbness/tingling of feet.  Risk Factors: Cancer - Stage 4, Lung. Comparison Study: No prior studies. Performing Technologist: Darlin Coco RDMS, RVT  Examination Guidelines: A complete evaluation includes B-mode  imaging, spectral Doppler, color Doppler, and power Doppler as needed of all accessible portions of each vessel. Bilateral testing is considered an integral part of a complete examination. Limited examinations for reoccurring indications may be performed as noted. The reflux portion of the exam is performed with the patient in reverse Trendelenburg.  +---------+---------------+---------+-----------+----------+--------------+ RIGHT    CompressibilityPhasicitySpontaneityPropertiesThrombus Aging +---------+---------------+---------+-----------+----------+--------------+ CFV      Full           Yes      Yes                                 +---------+---------------+---------+-----------+----------+--------------+ SFJ      Full                                                        +---------+---------------+---------+-----------+----------+--------------+ FV Prox  Full                                                        +---------+---------------+---------+-----------+----------+--------------+ FV Mid   Full                                                        +---------+---------------+---------+-----------+----------+--------------+  FV DistalFull                                                        +---------+---------------+---------+-----------+----------+--------------+ PFV      Full                                                        +---------+---------------+---------+-----------+----------+--------------+ POP      Full           Yes      Yes                                 +---------+---------------+---------+-----------+----------+--------------+ PTV      Full                                                        +---------+---------------+---------+-----------+----------+--------------+ PERO     Full                                                        +---------+---------------+---------+-----------+----------+--------------+    +---------+---------------+---------+-----------+----------+--------------+ LEFT     CompressibilityPhasicitySpontaneityPropertiesThrombus Aging +---------+---------------+---------+-----------+----------+--------------+ CFV      Full           Yes      Yes                                 +---------+---------------+---------+-----------+----------+--------------+ SFJ      Full                                                        +---------+---------------+---------+-----------+----------+--------------+ FV Prox  Full                                                        +---------+---------------+---------+-----------+----------+--------------+ FV Mid   Full                                                        +---------+---------------+---------+-----------+----------+--------------+ FV DistalFull                                                        +---------+---------------+---------+-----------+----------+--------------+  PFV      Full                                                        +---------+---------------+---------+-----------+----------+--------------+ POP      Full           Yes      Yes                                 +---------+---------------+---------+-----------+----------+--------------+ PTV      Full                                                        +---------+---------------+---------+-----------+----------+--------------+ PERO     Full                                                        +---------+---------------+---------+-----------+----------+--------------+     Summary: RIGHT: - There is no evidence of deep vein thrombosis in the lower extremity.  - No cystic structure found in the popliteal fossa.  - Occlusion of the RIGHT popliteal artery was noted incidentally on examination with collateralized PTA/DPA flow distally.  LEFT: - There is no evidence of deep vein thrombosis in the lower extremity.  - No  cystic structure found in the popliteal fossa.  - - Occlusion of the LEFT popliteal artery was noted incidentally on examination with collateralized PTA/DPA flow distally.  *See table(s) above for measurements and observations. Electronically signed by Harold Barban MD on 11/26/2022 at 11:48:26 PM.    Final    DG Chest 1 View  Result Date: 11/26/2022 CLINICAL DATA:  Follow-up pleural effusion EXAM: CHEST  1 VIEW COMPARISON:  11/15/2022 FINDINGS: Right chest remains clear. Pleural catheter remains in place at the inferior pleural space on the left. There is a persistent pleural effusion on the left, moderate in size, with atelectasis of the left lower lung. IMPRESSION: Persistent moderate left effusion with left lower lung atelectasis. Pleural catheter remains in place at the inferior left pleural space. Electronically Signed   By: Nelson Chimes M.D.   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   -Chest CT concerning for possible thrombus in the proximity of the lateral in between the left upper and left lower pulmonary vein ostia -Cannot be fully appreciated on current chest CT and would recommend further imaging -In the setting of normal left atrial size and no atrial arrhythmias, it would be an odd place to have a Denovo thrombus form although she is likely hypercoagulable from her underlying adeno CA -Will review with advanced imaging colleagues to see if any further testing necessary to try to delineate this further since nothing was seen on echo but that it is a poor area to evaluate on transthoracic echo -Agree with recommendations to continue DAPT with aspirin 81 mg daily, Plavix  75 mg daily and Xarelto 20 mg daily.  2.  Sinus tachycardia -She has been tachycardic all through her hospital stay with heart rates in the 120s.  Not sure if anxiety is driving her tachycardia -CT read possible pericardial  effusion but 2D echo did not show any pericardial effusion -She is not hypoxic but is anemic with a hemoglobin 9.8 which could be driving some of her tachycardia -TSH normal at 0.88 this admission  3.  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 Margaretann Loveless, 2D echo and PET SCAN , telemetry, EKGs, labs and examining patient as well as establishing an assessment and plan that was discussed with the patient.  > 50% of time was spent in direct patient care.       For questions or updates, please contact [place] Please consult [email or web address] for contact info under    Signed, Fransico Him, MD  11/29/2022 1:36 PM

## 2022-11-29 NOTE — Telephone Encounter (Signed)
Pt called stating that the pharmacy would not fill her Xarelto prescription for anything less than a 90 day supply.  Reviewed pt's chart, returned call for clarification, two identifiers used. Pt stated that the [facility] on [address] did not have the medication, so she had to switch to the one on [address].  [facility], med had not been switched over, but the pharmacist got that in process.  Pharmacist called stating that the prescription had been transferred, but just as the pt stated, it could not be processed for anything less than 90 days. Gave a verbal order for the starter pack and it went through insurance at no cost to pt.  Called pt and informed her of the starter pack and how to take her medications. Pt extremely appreciative.

## 2022-11-29 NOTE — Progress Notes (Addendum)
[facility]  Subjective  - No new complaints, wants to go home   Objective (!) 113/58 (!) 106 98.4 F (36.9 C) (Oral) 16 98%  Intake/Output Summary (Last 24 hours) at 11/29/2022 0751 Last data filed at 11/28/2022 1200 Gross per 24 hour  Intake --  Output 250 ml  Net -250 ml   Doppler signals B DP/PT intact Continued discoloration of toes on the left LE, motor intact Lungs non labored breathing Heart Tachy 120's bpm   Assessment/Planning: Melissa Pineda is a 37 y.o. 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  Pain has resolved.  Improved in flow with patent native arteries and doppler signals Left DP/PT.  CTA findings reviewed with Dr. Stanford Breed over the phone: IMPRESSION: 1. Thrombus within the lateral aspect of the left atrium between the left pulmonary vein ostia. 2. Stable findings of metastatic lung cancer, with primary left lower lobe masslike consolidation, left-sided pleural thickening, mediastinal and hilar adenopathy, and right pulmonary nodules as seen on recent PET scan. 3. Decreased left pleural effusion with indwelling left pleural drain. 4. Possible cortical infarct upper pole right kidney, incompletely evaluated on this study.  Cardiology consult prior to discharge was initiated.  Dr. Radford Pax will see patient in consultation.   She will be discharged on ASA, Plavix and Xarelto F/U per DR. Hawken Pending discharge  Melissa Pineda 11/29/2022 7:51 AM --  Laboratory Lab Results: Recent Labs    11/26/22 2024 11/28/22 0108  WBC 14.7* 10.2  HGB 11.3* 9.1*  HCT 34.3* 26.2*  PLT 293 312   BMET Recent Labs    11/26/22 1158 11/26/22 2024  NA 135 132*  K 4.3 4.0  CL 100 99  CO2 30 25  GLUCOSE 89 99  BUN 10 16  CREATININE 0.73 0.82  CALCIUM 8.6* 8.2*    COAG Lab Results  Component Value Date   INR 1.0 11/26/2022   INR 1.2 11/05/2022   No results  found for: "PTT"

## 2022-11-29 NOTE — Telephone Encounter (Signed)
Pt has existing appt on 3/8 with Dr. Valeta Harms.  She has been on immunotherapy, her drainage has slowed significantly.  If available, can we please move her appt up with Dr. Valeta Harms. Please notify her of change if able to move.     [name], MSN, APRN, NP-C, AGACNP-BC [facility] 11/29/2022, 11:43 AM   Please see [email or web address] for pager details.   From 7A-7P if no response, please call [phone] After hours, please call ELink [phone]

## 2022-11-29 NOTE — Progress Notes (Signed)
I have reviewed CT images with Melissa Pineda and we are concerned that patient may have tumor invasion into her left atrium/PVs and that this is not thrombus. Review of her recent PET scan shows avid uptake of FDG in the area of the Left atrium where the abnormality is noted on CT and there is significant FDG uptake with large tumor burden in the vicinity of the LA near the left Pulmonary veins and possible invading the pulmonary vein - Recommend CVTS consultation by Dr. Kipp Brood to give input. Not sure there is anything else we can do.

## 2022-11-29 NOTE — TOC Benefit Eligibility Note (Signed)
Patient Teacher, English as a foreign language completed.    The patient is currently admitted and upon discharge could be taking Eliquis 5 mg.  The current 30 day co-pay is $0.00.   The patient is currently admitted and upon discharge could be taking Xarelto 20 mg.  The current 30 day co-pay is $0.00.   The patient is insured through [place] Patient [place] Patient Advocate Team Direct Number: [phone]  Fax: [phone]

## 2022-11-29 NOTE — Discharge Summary (Signed)
Vascular and Vein Specialists Discharge Summary   Patient ID:  Melissa Pineda MRN: [id number] DOB/AGE: 1986-02-14 37 y.o.  Admit date: 11/26/2022 Discharge date: 11/29/2022 Attending Surgeon: Stanford Breed Admission Diagnosis: Critical lower limb ischemia [facility]) [I70.229]  Discharge Diagnoses:  Critical lower limb ischemia [facility]) [I70.229]  Secondary Diagnoses: Past Medical History:  Diagnosis Date   Anxiety    Phreesia 11/21/2020   Cancer [facility])     Procedures: 11/27/2022 Procedure(s): ABDOMINAL AORTOGRAM W/LOWER EXTREMITY PERIPHERAL VASCULAR THROMBECTOMY PERIPHERAL VASCULAR ATHERECTOMY  Discharged Condition: good  HPI: 23F who was urgently added into the clinic today for evaluation of ultrasound findings.  The patient has had an unusual course.  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  She is a never smoker.  She had malignant effusion and required a Pleurx catheter.  She reports worsening leg pain over the past several days.  Prior to this, she describes leg pain like shinsplints.  She reports severe pain about the metatarsals and toes on the left, classic for ischemic rest pain.  She reports cramping discomfort in the right leg, which is bearable to her.    Hospital Course:  Admitted as above. Underwent successful endovascular thrombectomy / intervention HD#2 (11/27/22). Able to walk and pain free 11/28/22. Workup completed evening of 11/28/22 including CT angiogram of chest showing ? Clot in left atrium. Cardiology evaluated. Concern for tumor invasion of L atrium / pulmonary vein. Discussed with Dr. Kipp Brood. No thoracic surgical options for stage IV disease. Discharged home on "triple therapy" - ASA / Plavix / Xarelto. Will see her again in 1 month. Encouraged to call Dr. Earlie Server on Monday.   Consults:  Treatment Team:  Curt Bears, MD Fransico Him,  MD  Significant Diagnostic Studies: CBC    Component Value Date/Time   WBC 10.2 11/28/2022 0108   RBC 3.13 (L) 11/28/2022 0108   HGB 9.1 (L) 11/28/2022 0108   HGB 15.4 (H) 11/20/2022 0906   HGB 12.6 11/24/2020 1106   HCT 26.2 (L) 11/28/2022 0108   HCT 38.0 11/24/2020 1106   PLT 312 11/28/2022 0108   PLT 142 (L) 11/20/2022 0906   PLT 314 11/24/2020 1106   MCV 83.7 11/28/2022 0108   MCV 92 11/24/2020 1106   MCH 29.1 11/28/2022 0108   MCHC 34.7 11/28/2022 0108   RDW 13.9 11/28/2022 0108   RDW 11.8 11/24/2020 1106   LYMPHSABS 1.8 11/26/2022 1158   LYMPHSABS 2.7 11/24/2020 1106   MONOABS 1.3 (H) 11/26/2022 1158   EOSABS 0.4 11/26/2022 1158   EOSABS 0.2 11/24/2020 1106   BASOSABS 0.1 11/26/2022 1158   BASOSABS 0.0 11/24/2020 1106    BMET    Component Value Date/Time   NA 132 (L) 11/26/2022 2024   NA 140 11/24/2020 1106   K 4.0 11/26/2022 2024   CL 99 11/26/2022 2024   CO2 25 11/26/2022 2024   GLUCOSE 99 11/26/2022 2024   BUN 16 11/26/2022 2024   BUN 7 11/24/2020 1106   CREATININE 0.82 11/26/2022 2024   CREATININE 0.90 11/20/2022 0906   CALCIUM 8.2 (L) 11/26/2022 2024   GFRNONAA >60 11/26/2022 2024   GFRNONAA >60 11/20/2022 0906   GFRAA 119 11/24/2020 1106    COAG estimated creatinine clearance is 88.3 mL/min (by C-G formula based on SCr of 0.82 mg/dL).  No results found for: "PTT"  Disposition:  Discharge to :Home Discharge Instructions  Call MD for:  redness, tenderness, or signs of infection (pain, swelling, bleeding, redness, odor or green/yellow discharge around incision site)   Complete by: As directed    Call MD for:  severe or increased pain, loss or decreased feeling  in affected limb(s)   Complete by: As directed    Call MD for:  temperature >100.5   Complete by: As directed    Resume previous diet   Complete by: As directed       Allergies as of 11/29/2022       Reactions   Hydrocodone Nausea Only   Dizzy, "crawl out of my skin"    Tramadol Other (See Comments)   "Weird feeling"        Medication List     TAKE these medications    ALPRAZolam 0.5 MG tablet Commonly known as: XANAX Take 1 tablet (0.5 mg total) by mouth 3 (three) times daily as needed for anxiety. What changed: when to take this   aspirin EC 81 MG tablet Take 1 tablet (81 mg total) by mouth daily. Swallow whole.   benzonatate 200 MG capsule Commonly known as: TESSALON Take 1 capsule (200 mg total) by mouth 4 (four) times daily as needed for cough. What changed: when to take this   clopidogrel 75 MG tablet Commonly known as: PLAVIX Take 1 tablet (75 mg total) by mouth daily.   dexamethasone 6 MG tablet Commonly known as: DECADRON Take 1 tablet (6 mg total) by mouth every morning. Take 1/2 (3mg ) tablet on 1/23. Then take 1 tablet (6mg ) every AM starting on 1/24 or as directed.   folic acid 1 MG tablet Commonly known as: FOLVITE Take 1 tablet (1 mg total) by mouth daily.   gabapentin 100 MG capsule Commonly known as: NEURONTIN Take 100 mg by mouth 2 (two) times daily.  Start 100 mg twice a day x 3 days, if tolerating, increase to 200 mg twice a day. Take 2 capsules (200 mg total) by mouth 2 (two) times daily   Magnesium Oxide -Mg Supplement 400 MG Caps Take 400 mg by mouth 2 (two) times daily.   metoCLOPramide 5 MG tablet Commonly known as: Reglan Take 1 tablet (5 mg total) by mouth every 6 (six) hours as needed for nausea.   Mirena (52 MG) 20 MCG/DAY Iud Generic drug: levonorgestrel 1 each by Intrauterine route once.   Ondansetron 4 MG Film Take 4-8 mg by mouth every 6 (six) hours as needed.   ondansetron 8 MG disintegrating tablet Commonly known as: ZOFRAN-ODT Take 1 tablet (8 mg total) by mouth every 8 (eight) hours as needed for nausea or vomiting.   ondansetron 8 MG tablet Commonly known as: ZOFRAN Take 1 tablet (8 mg total) by mouth every 8 (eight) hours as needed for nausea or vomiting.   oxyCODONE 5 MG immediate  release tablet Commonly known as: Roxicodone Take 1 tablet (5 mg total) by mouth every 4 (four) hours as needed for severe pain. What changed: when to take this   QC TUMERIC COMPLEX PO Take 500 mg by mouth daily.   repotrectinib 40 MG capsule Commonly known as: AUGTYRO Take 160 mg by mouth daily.   Rivaroxaban 15 MG Tabs tablet Commonly known as: XARELTO Take 1 tablet (15 mg total) by mouth 2 (two) times daily with a meal.   rivaroxaban 20 MG Tabs tablet Commonly known as: XARELTO Take 1 tablet (20 mg total) by mouth daily with supper. Start taking on: December 19, 2022  zinc sulfate 220 (50 Zn) MG capsule Take 220 mg by mouth daily.         Yevonne Aline. Stanford Breed, MD [facility] Phone Number: [phone] 11/29/2022 5:39 PM

## 2022-11-29 NOTE — Progress Notes (Signed)
Palliative Care Progress Note  Melissa Pineda is significantly improved today. Doing well post vascular procedure. She is sitting on side of the bed, preparing to ambulate. Pain is well controlled and significantly improved. Since starting immunotherapy her effusion has decreased, her persistent cough has resolved and her appetite is returning.She is hopeful to go home either later today or tomorrow AM. She is awaiting communication with Vascular Service. Will see how her pain level does with ambulation. Provided holistic support and will see her in [facility] after discharge.  Lane Hacker, DO Palliative Medicine  Time: 35 min

## 2022-11-29 NOTE — Telephone Encounter (Signed)
Patient is scheduled 2/23 at 2pm with Dr. Valeta Harms- will look for any cancellations. Patient is aware.

## 2022-11-29 NOTE — Progress Notes (Signed)
Mobility Specialist Progress Note:   11/29/22 1033  Mobility  Activity Ambulated independently in room  Level of Assistance Independent  Assistive Device None  Distance Ambulated (ft) 100 ft  Activity Response Tolerated well  Mobility Referral Yes  $Mobility charge 1 Mobility   Pt received in bed and agreeable. No complaints. Pt returned to bed with all needs met, call bell in reach, and family in room.   Andrey Campanile Mobility Specialist Please contact via SecureChat or  Rehab office at [phone]

## 2022-12-02 ENCOUNTER — Telehealth: Payer: Self-pay | Admitting: Pharmacist

## 2022-12-02 NOTE — Telephone Encounter (Signed)
Oral Chemotherapy Pharmacist Encounter   Phone call made to [employer] (tele: [phone]) to check on status of patient's application for approval. Spoke with representative Jeanett Schlein who stated they received the referral from [facility] Access Support on 11/27/22 and it will take a minimum of 5-7 business days to process the application for the final decision.  Will follow back up with [employer] on 12/06/22 if a decision has not been received by that point. Oral chemotherapy clinic will continue to follow for final determination.   Leron Croak, PharmD, BCPS, [facility] Hematology/Oncology Clinical Pharmacist [facility] [place] [phone] 12/02/2022 9:38 AM

## 2022-12-03 ENCOUNTER — Telehealth: Payer: Self-pay

## 2022-12-03 ENCOUNTER — Telehealth: Payer: Self-pay | Admitting: Vascular Surgery

## 2022-12-03 DIAGNOSIS — D72829 Elevated white blood cell count, unspecified: Secondary | ICD-10-CM | POA: Diagnosis not present

## 2022-12-03 DIAGNOSIS — Z9181 History of falling: Secondary | ICD-10-CM | POA: Diagnosis not present

## 2022-12-03 DIAGNOSIS — Z8701 Personal history of pneumonia (recurrent): Secondary | ICD-10-CM | POA: Diagnosis not present

## 2022-12-03 DIAGNOSIS — C3492 Malignant neoplasm of unspecified part of left bronchus or lung: Secondary | ICD-10-CM | POA: Diagnosis not present

## 2022-12-03 DIAGNOSIS — J910 Malignant pleural effusion: Secondary | ICD-10-CM | POA: Diagnosis not present

## 2022-12-03 DIAGNOSIS — Z4801 Encounter for change or removal of surgical wound dressing: Secondary | ICD-10-CM | POA: Diagnosis not present

## 2022-12-03 DIAGNOSIS — Z7951 Long term (current) use of inhaled steroids: Secondary | ICD-10-CM | POA: Diagnosis not present

## 2022-12-03 DIAGNOSIS — D630 Anemia in neoplastic disease: Secondary | ICD-10-CM | POA: Diagnosis not present

## 2022-12-03 DIAGNOSIS — Z48813 Encounter for surgical aftercare following surgery on the respiratory system: Secondary | ICD-10-CM | POA: Diagnosis not present

## 2022-12-03 DIAGNOSIS — F419 Anxiety disorder, unspecified: Secondary | ICD-10-CM | POA: Diagnosis not present

## 2022-12-03 NOTE — Telephone Encounter (Addendum)
Pt called stating that Dr. Stanford Breed wanted to know if she experienced any foot pain. She is not noting some L foot pain and wanted to get some assurance.  Reviewed pt's chart, returned call for clarification, no answer, lf vm.  Pt returned call.  Reviewed pt's chart, returned call for clarification, two identifiers used. Pt stated that she is having a sharp shooting pain in the bottom of her L foot. She also notes an intense intermittent dull ache in her L 2nd toe. Both began last night. She denies any swelling, discoloration, and her foot is warm. She is taking Tylenol for general post-op pain and confirms that this pain is not severe. Discussed nerve pain and that her symptoms sound indicative of that. She was concerned and wanted some reassurance.  Spoke with Dr. Stanford Breed, who advised that pt keep monitoring symptoms and if her pain becomes severe, she will need an appt in a week.  Called pt and relayed Dr. Mora Appl advice. Pt reassured. Confirmed understanding.

## 2022-12-03 NOTE — Telephone Encounter (Signed)
-----   Message from Cherre Robins, MD sent at 11/27/2022 10:54 PM EST ----- Melissa Pineda 11/27/2022 Procedure: 1) Ultrasound guided right common femoral artery access 2) Aortogram 3) left lower extremity angiogram with third order cannulation  4) mechanical thrombectomy of left profunda femoris artery (8 [place] JET[place]) 5) mechanical thrombectomy of left popliteal artery  (6 [place] JET[place]) 6) laser atherectomy and angioplasty (3 x 277mm) of left anterior tibial artery 7) Conscious sedation (129 minutes)  Assistant: none Follow up: 4 weeks with me  Studies for follow up: ABI, BLE duplex  Thank you! Gershon Mussel

## 2022-12-04 ENCOUNTER — Encounter: Payer: Self-pay | Admitting: Medical Oncology

## 2022-12-05 ENCOUNTER — Telehealth (HOSPITAL_COMMUNITY): Payer: Self-pay | Admitting: Physician Assistant

## 2022-12-05 ENCOUNTER — Telehealth: Payer: Self-pay | Admitting: Medical Oncology

## 2022-12-05 ENCOUNTER — Telehealth: Payer: Self-pay

## 2022-12-05 LAB — FUNGUS CULTURE RESULT

## 2022-12-05 LAB — FUNGUS CULTURE WITH STAIN

## 2022-12-05 LAB — FUNGAL ORGANISM REFLEX

## 2022-12-05 NOTE — Telephone Encounter (Signed)
Nose bleeding with blood trickling down her throat or out of nose. I recommended Beth contact Dr. Luan Pulling who started pt on triple therapy anticoagulants.

## 2022-12-05 NOTE — Telephone Encounter (Signed)
Received call from [place], Home health nurse for Ms. Melissa Pineda, regarding volume of effusion from PleurX catheter placed by IR. She reports the last 3 times the patient has drained, she has emptied less than 150cc.  Per instructions, they were asked to call IR when this occurred.   Beth reports patient breathing improving over time as she has undergone chemo treatment.  There is no drainage around the site of the tube insertion. Reassured Beth that this is likely indicative of reduced production of pleural fluid and they could reduce the frequency of draining, starting with every 3 days, and then decreasing to every 4 or 5 days. She asked about timing of PleurX removal.  I suggested this would be better to defer to oncology.  Nurse Beth voiced understanding.  Electronically Signed: Pasty Spillers, PA-C 12/05/2022, 1:34 PM

## 2022-12-05 NOTE — Telephone Encounter (Signed)
Med reconciliation completed with [facility] nurse . Per Dr. Julien Nordmann , ok to resume [facility] and nursing visits 1x week for 5 weeks.  Pleurix drainage less 50 ml today.

## 2022-12-05 NOTE — Telephone Encounter (Signed)
Beth with [facility] called stating that the pt was experiencing some nose bleeds. The pt had some bloody crusting in her nostrils, so Beth recommended some saline nasal spray. Now, the pt is c/o blood trickling down the back of her throat and dripping from her nose. Instructed her to tell the pt to pinch the bridge of her nose tightly for 5-10 minutes while leaning her head downwards, put an ice pack on her nose, and if she has significant bleeding that can't be stopped, she should seek urgent/emergent care. Informed her that this should be a temporary issue while adjusting to the medication and her menstrual periods may be affected also. Confirmed understanding.

## 2022-12-06 NOTE — Telephone Encounter (Signed)
Oral Chemotherapy Pharmacist Encounter   Called to follow up on patient's [employer] manufacturer assistance application for Augtyro (tele: [phone]).   Spoke with representative Felicia. Representative stated they needed the front and back of patient's insurance card. I have faxed the patient's insurance card to [facility] for their documents (fax number: [phone]). Confirmed no other documents are needed at this time for processing.   Oral chemotherapy clinic will continue to follow.   Leron Croak, PharmD, BCPS, BCOP Hematology/Oncology Clinical Pharmacist [facility] [place] [phone] 12/06/2022 1:06 PM

## 2022-12-08 ENCOUNTER — Other Ambulatory Visit: Payer: Self-pay | Admitting: Internal Medicine

## 2022-12-09 NOTE — Progress Notes (Unsigned)
[facility] OFFICE PROGRESS NOTE  Patient, No Pcp Per No address on file  DIAGNOSIS: Stage IVB (T4, N3, M1b) non-small cell lung cancer, adenocarcinoma presented with large left diaphragmatic surface mass in addition to left hilar, infrahilar, AP window, right and left paratracheal, subcarinal as well as left internal mammary lymphadenopathy in addition to right gastric lymph node and solitary Small right frontal calvarial osseous metastatic disease with large malignant left pleural effusion diagnosed in January 2024.     Molecular studies by [employer] as well as [employer] blood test showed:    EZR-ROS1 fusion approved by FDA Crizotinib, Entrectinib, Repotrectinib   approved in other indication Ceritinib, Lorlatinib Yes      3.6%   PD-L1 expression by foundation 1 that was 98%.  PRIOR THERAPY: Status post left Pleurx catheter placement for drainage of recurrent left pleural effusion   CURRENT THERAPY: Repotrectinib (Augtyro) 160 mg p.o. daily for the first 2 weeks and then 160 mg p.o. twice daily after that if tolerated.  First dose on *** 2/***/24. Starting from today, she will increase her dose to maintenance dose of 160 mg BID.   INTERVAL HISTORY: Melissa Pineda [age] y.o. female returns to the clinic today for a follow-up visit accompanied by***.  The patient has had an unusual course in her health recently.  The patient is a otherwise healthy 37 year old never smoker who was recently diagnosed with stage IV lung cancer in January 2024.  She has a Pleurx catheter for malignant effusions for which she is draining ***milliliters every ***day.  She was found to have an actionable mutation with ROS1 and she started targeted treatment with Augtyro 160 mg p.o. daily on***.  She has been taking this for approximately 2 weeks and thus far has tolerated it well.  The patient was admitted to the hospital on 11/26/22 after calling the clinic endorsing bilateral lower extremity leg pain  for several days which led to an outpatient lower extremity Doppler ultrasound which showed occlusion of the left and right popliteal artery.  This led to a lower extremity arterial ultrasound with once again noted total occlusion in the right popliteal artery and extending into the tibioperoneal trunk and total occlusion in the peroneal artery.  The left showed total occlusion in the deep femoral artery and total occlusion in the mid to distal popliteal artery extending into the tibioperoneal medial trunk.  There is total occlusion in the distal segment of the anterior tibial artery and total occlusion in the peroneal artery and total occlusion in the dorsal pedis artery.  She was subsequently admitted to the hospital until 11/29/2022 and underwent successful endovascular thrombectomy under the care of Dr. Stanford Breed from vascular surgery.  While admitted, she had a CT angiogram on 11/28/22 showing thrombus in the lateral aspect of the left atrium between the left pulmonary vein ostia.  Cardiothoracic surgery was consulted and no surgical options were recommended.  She is discharged home on triple therapy with aspirin, Plavix, and Xarelto.  The patient has been tolerating her blood thinner except she has experienced some posterior nosebleeds for which ***  Regarding her lung cancer, the patient is tolerating her targeted treatment well without any noticeable adverse side effects.  Today she denies any fever, chills, night sweats, or unexplained weight loss.  Cough and left-sided chest pain?  Tessalon Perles for cough?  Oxycodone and gabapentin for chest pain?  [facility] palliative care?  She denies any hemoptysis.  Denies any nausea, vomiting, diarrhea, or constipation.  Denies any headache or visual changes.  Denies any abdominal pain.  Myalgias?  Balance changes and visual changes?  Taste changes?  She is here today for evaluation and a 2-week follow-up visit to manage any adverse side effects of treatment.   Getting  home health nurse.  Called c/o nose bleeding.   MEDICAL HISTORY: Past Medical History:  Diagnosis Date   Anxiety    [employer] 11/21/2020   Cancer ([place])     ALLERGIES:  is allergic to hydrocodone and tramadol.  MEDICATIONS:  Current Outpatient Medications  Medication Sig Dispense Refill   acetaminophen (TYLENOL) 325 MG tablet Take 650 mg by mouth every 6 (six) hours as needed for mild pain (for mild pain.).     ALPRAZolam (XANAX) 0.5 MG tablet Take 1 tablet (0.5 mg total) by mouth 3 (three) times daily as needed for anxiety. (Patient taking differently: Take 0.5 mg by mouth daily as needed for anxiety.) 60 tablet 0   aspirin EC 81 MG tablet Take 1 tablet (81 mg total) by mouth daily. Swallow whole. 30 tablet 12   benzonatate (TESSALON) 200 MG capsule Take 1 capsule (200 mg total) by mouth 4 (four) times daily as needed for cough. (Patient taking differently: Take 200 mg by mouth daily as needed for cough.) 120 capsule 2   Cholecalciferol (VITAMIN D-3) 125 MCG (5000 UT) TABS Take 1 tablet by mouth daily.     clopidogrel (PLAVIX) 75 MG tablet Take 1 tablet (75 mg total) by mouth daily. 30 tablet 11   gabapentin (NEURONTIN) 100 MG capsule Take 100 mg by mouth 2 (two) times daily.  Start 100 mg twice a day x 3 days, if tolerating, increase to 200 mg twice a day. Take 2 capsules (200 mg total) by mouth 2 (two) times daily (Patient not taking: Reported on 11/27/2022)     levonorgestrel (MIRENA, 52 MG,) 20 MCG/DAY IUD 1 each by Intrauterine route once.     Magnesium Oxide -Mg Supplement 400 MG CAPS Take 400 mg by mouth 2 (two) times daily. (Patient not taking: Reported on 11/27/2022) 60 capsule 0   metoCLOPramide (REGLAN) 5 MG tablet Take 1 tablet (5 mg total) by mouth every 6 (six) hours as needed for nausea. (Patient not taking: Reported on 11/27/2022) 30 tablet 1   ondansetron (ZOFRAN) 8 MG tablet Take 1 tablet (8 mg total) by mouth every 8 (eight) hours as needed for nausea or vomiting. 20 tablet 0    ondansetron (ZOFRAN-ODT) 8 MG disintegrating tablet Take 1 tablet (8 mg total) by mouth every 8 (eight) hours as needed for nausea or vomiting. (Patient not taking: Reported on 11/27/2022) 20 tablet 1   Ondansetron 4 MG FILM Take 4-8 mg by mouth every 6 (six) hours as needed. (Patient not taking: Reported on 11/27/2022) 15 each 0   OVER THE COUNTER MEDICATION Take 1,500 mg by mouth 2 (two) times daily. Beet root     oxyCODONE (ROXICODONE) 5 MG immediate release tablet Take 1 tablet (5 mg total) by mouth every 4 (four) hours as needed for severe pain. (Patient taking differently: Take 5 mg by mouth every 6 (six) hours as needed for severe pain.) 120 tablet 0   polyethylene glycol (MIRALAX / GLYCOLAX) 17 g packet Take 17 g by mouth daily.     Repotrectinib 40 MG CAPS Take 160 mg by mouth daily. 56 capsule 0   Rivaroxaban (XARELTO) 15 MG TABS tablet Take 1 tablet (15 mg total) by mouth 2 (two) times daily  with a meal. 42 tablet 0   [START ON 12/19/2022] rivaroxaban (XARELTO) 20 MG TABS tablet Take 1 tablet (20 mg total) by mouth daily with supper. 30 tablet 3   Turmeric (QC TUMERIC COMPLEX PO) Take 500 mg by mouth daily.     zinc sulfate 220 (50 Zn) MG capsule Take 220 mg by mouth daily.     Current Facility-Administered Medications  Medication Dose Route Frequency Provider Last Rate Last Admin   famotidine (PEPCID) tablet 20 mg  20 mg Oral Once Acquanetta Chain, DO        SURGICAL HISTORY:  Past Surgical History:  Procedure Laterality Date   ABDOMINAL AORTOGRAM W/LOWER EXTREMITY N/A 11/27/2022   Procedure: ABDOMINAL AORTOGRAM W/LOWER EXTREMITY;  Surgeon: Cherre Robins, MD;  Location: [facility];  Service: Cardiovascular;  Laterality: N/A;   IR PERC PLEURAL DRAIN W/INDWELL CATH W/IMG GUIDE  11/09/2022   PERIPHERAL VASCULAR ATHERECTOMY  11/27/2022   Procedure: PERIPHERAL VASCULAR ATHERECTOMY;  Surgeon: Cherre Robins, MD;  Location: [facility];  Service: Cardiovascular;;    PERIPHERAL VASCULAR THROMBECTOMY  11/27/2022   Procedure: PERIPHERAL VASCULAR THROMBECTOMY;  Surgeon: Cherre Robins, MD;  Location: [facility];  Service: Cardiovascular;;   WISDOM TOOTH EXTRACTION      REVIEW OF SYSTEMS:   Review of Systems  Constitutional: Negative for appetite change, chills, fatigue, fever and unexpected weight change.  HENT:   Negative for mouth sores, nosebleeds, sore throat and trouble swallowing.   Eyes: Negative for eye problems and icterus.  Respiratory: Negative for cough, hemoptysis, shortness of breath and wheezing.   Cardiovascular: Negative for chest pain and leg swelling.  Gastrointestinal: Negative for abdominal pain, constipation, diarrhea, nausea and vomiting.  Genitourinary: Negative for bladder incontinence, difficulty urinating, dysuria, frequency and hematuria.   Musculoskeletal: Negative for back pain, gait problem, neck pain and neck stiffness.  Skin: Negative for itching and rash.  Neurological: Negative for dizziness, extremity weakness, gait problem, headaches, light-headedness and seizures.  Hematological: Negative for adenopathy. Does not bruise/bleed easily.  Psychiatric/Behavioral: Negative for confusion, depression and sleep disturbance. The patient is not nervous/anxious.     PHYSICAL EXAMINATION:  not currently breastfeeding.  ECOG PERFORMANCE STATUS: {CHL ONC ECOG [id number]  Physical Exam  Constitutional: Oriented to person, place, and time and well-developed, well-nourished, and in no distress. No distress.  HENT:  Head: Normocephalic and atraumatic.  Mouth/Throat: Oropharynx is clear and moist. No oropharyngeal exudate.  Eyes: Conjunctivae are normal. Right eye exhibits no discharge. Left eye exhibits no discharge. No scleral icterus.  Neck: Normal range of motion. Neck supple.  Cardiovascular: Normal rate, regular rhythm, normal heart sounds and intact distal pulses.   Pulmonary/Chest: Effort normal and breath  sounds normal. No respiratory distress. No wheezes. No rales.  Abdominal: Soft. Bowel sounds are normal. Exhibits no distension and no mass. There is no tenderness.  Musculoskeletal: Normal range of motion. Exhibits no edema.  Lymphadenopathy:    No cervical adenopathy.  Neurological: Alert and oriented to person, place, and time. Exhibits normal muscle tone. Gait normal. Coordination normal.  Skin: Skin is warm and dry. No rash noted. Not diaphoretic. No erythema. No pallor.  Psychiatric: Mood, memory and judgment normal.  Vitals reviewed.  LABORATORY DATA: Lab Results  Component Value Date   WBC 10.2 11/28/2022   HGB 9.1 (L) 11/28/2022   HCT 26.2 (L) 11/28/2022   MCV 83.7 11/28/2022   PLT 312 11/28/2022      Chemistry  Component Value Date/Time   NA 132 (L) 11/26/2022 2024   NA 140 11/24/2020 1106   K 4.0 11/26/2022 2024   CL 99 11/26/2022 2024   CO2 25 11/26/2022 2024   BUN 16 11/26/2022 2024   BUN 7 11/24/2020 1106   CREATININE 0.82 11/26/2022 2024   CREATININE 0.90 11/20/2022 0906      Component Value Date/Time   CALCIUM 8.2 (L) 11/26/2022 2024   ALKPHOS 65 11/26/2022 2024   AST 27 11/26/2022 2024   AST 14 (L) 11/20/2022 0906   ALT 35 11/26/2022 2024   ALT 46 (H) 11/20/2022 0906   BILITOT 0.2 (L) 11/26/2022 2024   BILITOT 0.6 11/20/2022 0906       RADIOGRAPHIC STUDIES:  CT ANGIO CHEST AORTA W/ & OR WO/CM & GATING ([facility] ONLY)  Result Date: 11/28/2022 CLINICAL DATA:  Thrombosis of lower extremities. Evaluate source. Known metastatic left lower lobe lung cancer EXAM: CT ANGIOGRAPHY CHEST WITHOUT AND WITH CONTRAST TECHNIQUE: Multidetector CT imaging of the chest was performed using the standard protocol before and after bolus administration of intravenous contrast. Multiplanar CT image reconstructions and MIPs were obtained to evaluate the vascular anatomy. RADIATION DOSE REDUCTION: This exam was performed according to the departmental dose-optimization  program which includes automated exposure control, adjustment of the mA and/or kV according to patient size and/or use of iterative reconstruction technique. CONTRAST:  77mL OMNIPAQUE IOHEXOL 350 MG/ML SOLN COMPARISON:  11/26/2022, 11/15/2022 FINDINGS: Cardiovascular: The heart is mildly enlarged, with prominent biventricular dilatation. There is adherent thrombus within the left atrium seen between the ostia of the left pulmonary veins, measuring 1.3 x 0.8 cm. This is the likely source for lower extremity emboli. The thoracic aorta is unremarkable without aneurysm or dissection. Opacification of the pulmonary vasculature is suboptimal due to timing of contrast bolus. There is no large central pulmonary embolus. Mediastinum/Nodes: Diffuse lymphadenopathy throughout the mediastinum and left hilar regions consistent with the metabolically active adenopathy on recent PET scan. No significant change. Thyroid, trachea, and esophagus are grossly unremarkable. Lungs/Pleura: Masslike left lower lobe consolidation seen on prior exams is again identified, consistent with known history of left lower lobe adenocarcinoma. The circumferential left pleural thickening seen on prior exams is again identified. Decreased left pleural fluid with indwelling left pleural drain. Stable nodularity within the right middle and right lower lobe consistent with contralateral pulmonary metastases. There are no new areas of consolidation within either lung. No pneumothorax. Central airways are patent. Upper Abdomen: There is a small area of decreased cortical enhancement within the upper pole right kidney, incompletely imaged due to slice selection. Renal infarct cannot be excluded in light of the left atrial thrombus described above and history of lower extremity thromboembolic disease. No other acute upper abdominal findings. Musculoskeletal: No acute or destructive bony lesions. Reconstructed images demonstrate no additional findings. Review  of the MIP images confirms the above findings. IMPRESSION: 1. Thrombus within the lateral aspect of the left atrium between the left pulmonary vein ostia. 2. Stable findings of metastatic lung cancer, with primary left lower lobe masslike consolidation, left-sided pleural thickening, mediastinal and hilar adenopathy, and right pulmonary nodules as seen on recent PET scan. 3. Decreased left pleural effusion with indwelling left pleural drain. 4. Possible cortical infarct upper pole right kidney, incompletely evaluated on this study. Critical Value/emergent results were called by telephone at the time of interpretation on 11/28/2022 at 8:29 pm to provider DR ROBBINS, who verbally acknowledged these results. Electronically Signed   By: Legrand Como  Owens Shark M.D.   On: 11/28/2022 20:40   PERIPHERAL VASCULAR CATHETERIZATION  Result Date: 11/27/2022 DATE OF SERVICE: 11/27/2022  PATIENT:  Zailyn Merkley  37 y.o. female  PRE-OPERATIVE DIAGNOSIS: subacute thrombosis of left profunda and popliteal arteries causing ischemic rest pain  POST-OPERATIVE DIAGNOSIS:  Same  PROCEDURE:  1) Ultrasound guided right common femoral artery access 2) Aortogram 3) left lower extremity angiogram with third order cannulation 4) mechanical thrombectomy of left profunda femoris artery (8 [place] JETi) 5) mechanical thrombectomy of left popliteal artery  (6 [place] JETi) 6) laser atherectomy and angioplasty (3 x 267mm) of left anterior tibial artery 7) Conscious sedation (129 minutes)   SURGEON:  Yevonne Aline. Stanford Breed, MD  ASSISTANT: none  ANESTHESIA:   local and IV sedation  ESTIMATED BLOOD LOSS: 763mL  LOCAL MEDICATIONS USED:  LIDOCAINE  COUNTS: confirmed correct.  PATIENT DISPOSITION:  PACU - hemodynamically stable.  Delay start of Pharmacological VTE agent (>24hrs) due to surgical blood loss or risk of bleeding: no  INDICATION FOR PROCEDURE: Korri Ask is a 37 y.o. female with occlusion of left profunda femoris and popliteal arteries causing ischemic rest  pain.  I felt this was likely subacute given the weeks long onset of symptoms.  Preoperative duplex suggested left profunda femoris and popliteal artery occlusions. After careful discussion of risks, benefits, and alternatives the patient was offered angiography with possible intervention. The patient understood and wished to proceed.  OPERATIVE FINDINGS: Terminal aorta and iliac arteries: Widely patent without flow-limiting stenosis  Left lower extremity: Common femoral artery: Healthy and widely patent Profunda femoris artery: Occluded at its origin.  Reconstitution at second-order branching in the mid thigh. Superficial femoral artery: Healthy and widely patent Popliteal artery: Above-knee segment healthy and widely patent.  Artery abruptly occludes behind the knee.  Robust collateralization around the popliteal occlusion suggesting chronicity to the occlusion.  Anterior tibial artery: Reconstitutes several centimeters distal to its origin from geniculate collaterals Tibioperoneal trunk: Occluded Peroneal artery:  Reconstitutes several centimeters distal to its origin from geniculate collaterals Posterior tibial artery:  Reconstitutes several centimeters distal to its origin from geniculate collaterals. Pedal circulation: Fills via collaterals  DESCRIPTION OF PROCEDURE: After identification of the patient in the pre-operative holding area, the patient was transferred to the operating room. The patient was positioned supine on the operating room table.  Anesthesia was induced. The groins was prepped and draped in standard fashion. A surgical pause was performed confirming correct patient, procedure, and operative location.  The right groin was anesthetized with subcutaneous injection of 1% lidocaine. Using ultrasound guidance, the right common femoral artery was accessed with micropuncture technique. Fluoroscopy was used to confirm cannulation over the femoral head. The 66F sheath was upsized to 18F.  A Benson wire  was advanced into the distal aorta. Over the wire an omni flush catheter was advanced to the level of L2. Aortogram was performed - see above for details.  The left common iliac artery was selected with an omniflush catheter and Glidewire advantage guidewire. The wire was advanced into the common femoral artery. Over the wire the omni flush catheter was advanced into the external iliac artery. Selective angiography was performed - see above for details.  The decision was made to intervene. The patient was heparinized with 7000 units of heparin. The 18F sheath was exchanged for a 8 [place] by 45 cm sheath. Selective angiography of the left lower extremity was performed prior to intervention.  The lesions were treated with: mechanical thrombectomy of left profunda femoris  artery (8 [place] JETi) mechanical thrombectomy of left popliteal artery  (6 [place] JETi) laser atherectomy and angioplasty (3 x 246mm) of left anterior tibial artery  Completion angiography revealed: Profunda femoris thrombosis resolved Popliteal artery occlusion recanalized with combination of thrombectomy, angioplasty, and arthrectomy. Not able to reestablish inline flow because of significant thrombus in the ankle and foot. The foot does fill in some of the native vessels do opacify with contrast.   A Perclose device was used to close the arteriotomy. Hemostasis was excellent upon completion.  Conscious sedation was administered with the use of IV fentanyl and midazolam under continuous physician and nurse monitoring.  Heart rate, blood pressure, and oxygen saturation were continuously monitored.  Total sedation time was 129 minutes  Upon completion of the case instrument and sharps counts were confirmed correct. The patient was transferred to the PACU in good condition. I was present for all portions of the procedure.  PLAN: Resume heparin.  Monitor for clinical improvement.  Will reevaluate in the morning.  If foot pain persists, will proceed to  the OR for operative thrombectomy.  Yevonne Aline. Stanford Breed, MD [facility] Phone Number: [phone] 11/27/2022 1:11 PM    ECHOCARDIOGRAM COMPLETE  Result Date: 11/27/2022    ECHOCARDIOGRAM REPORT   Patient Name:   MEEYA GOLDIN Date of Exam: 11/27/2022 Medical Rec #:  [id number]    Height:       66.0 in Accession #:    [id number]   Weight:       130.1 lb Date of Birth:  February 18, 1986     BSA:          1.666 m Patient Age:    48 years     BP:           125/74 mmHg Patient Gender: F            HR:           110 bpm. Exam Location:  Inpatient Procedure: 2D Echo, Cardiac Doppler and Color Doppler Indications:    PAD  History:        Patient has no prior history of Echocardiogram examinations.                 Lung cancer.  Sonographer:    Clayton Lefort Referring Phys: [id number] [place]  1. Left ventricular ejection fraction, by estimation, is 50 to 55%. The left ventricle has low normal function. The left ventricle has no regional wall motion abnormalities. Left ventricular diastolic parameters were normal.  2. Right ventricular systolic function is normal. The right ventricular size is normal.  3. The mitral valve is normal in structure. No evidence of mitral valve regurgitation. No evidence of mitral stenosis.  4. The aortic valve was not well visualized. Aortic valve regurgitation is not visualized. No aortic stenosis is present.  5. The inferior vena cava is normal in size with greater than 50% respiratory variability, suggesting right atrial pressure of 3 mmHg. FINDINGS  Left Ventricle: Left ventricular ejection fraction, by estimation, is 50 to 55%. The left ventricle has low normal function. The left ventricle has no regional wall motion abnormalities. The left ventricular internal cavity size was normal in size. There is no left ventricular hypertrophy. Left ventricular diastolic parameters were normal. Right Ventricle: The right ventricular size is normal. No  increase in right ventricular wall thickness. Right ventricular systolic function is normal. Left Atrium: Left atrial size was normal in size.  Right Atrium: Right atrial size was normal in size. Pericardium: There is no evidence of pericardial effusion. Mitral Valve: The mitral valve is normal in structure. No evidence of mitral valve regurgitation. No evidence of mitral valve stenosis. Tricuspid Valve: The tricuspid valve is normal in structure. Tricuspid valve regurgitation is trivial. Aortic Valve: The aortic valve was not well visualized. Aortic valve regurgitation is not visualized. No aortic stenosis is present. Aortic valve mean gradient measures 3.0 mmHg. Aortic valve peak gradient measures 4.8 mmHg. Aortic valve area, by VTI measures 2.83 cm. Pulmonic Valve: The pulmonic valve was not well visualized. Pulmonic valve regurgitation is trivial. Aorta: The aortic root and ascending aorta are structurally normal, with no evidence of dilitation. Venous: The inferior vena cava is normal in size with greater than 50% respiratory variability, suggesting right atrial pressure of 3 mmHg. IAS/Shunts: The interatrial septum was not well visualized.  LEFT VENTRICLE PLAX 2D LVIDd:         4.40 cm LVIDs:         3.20 cm LV PW:         0.90 cm LV IVS:        0.70 cm LVOT diam:     2.10 cm LV SV:         50 LV SV Index:   30 LVOT Area:     3.46 cm  RIGHT VENTRICLE             IVC RV Basal diam:  2.40 cm     IVC diam: 1.20 cm RV S prime:     12.10 cm/s TAPSE (M-mode): 2.2 cm LEFT ATRIUM             Index        RIGHT ATRIUM          Index LA diam:        2.20 cm 1.32 cm/m   RA Area:     7.89 cm LA Vol (A2C):   28.1 ml 16.87 ml/m  RA Volume:   13.40 ml 8.05 ml/m LA Vol (A4C):   30.6 ml 18.37 ml/m LA Biplane Vol: 31.8 ml 19.09 ml/m  AORTIC VALVE AV Area (Vmax):    2.76 cm AV Area (Vmean):   2.74 cm AV Area (VTI):     2.83 cm AV Vmax:           110.00 cm/s AV Vmean:          82.300 cm/s AV VTI:            0.175 m AV  Peak Grad:      4.8 mmHg AV Mean Grad:      3.0 mmHg LVOT Vmax:         87.70 cm/s LVOT Vmean:        65.200 cm/s LVOT VTI:          0.143 m LVOT/AV VTI ratio: 0.82  AORTA Ao Root diam: 3.30 cm Ao Asc diam:  2.70 cm  SHUNTS Systemic VTI:  0.14 m Systemic Diam: 2.10 cm Oswaldo Milian MD Electronically signed by Oswaldo Milian MD Signature Date/Time: 11/27/2022/6:00:10 PM    Final    VAS [place] LOWER EXTREMITY ARTERIAL DUPLEX  Result Date: 11/26/2022 LOWER EXTREMITY ARTERIAL DUPLEX STUDY Patient Name:  ANALEA MULLER  Date of Exam:   11/26/2022 Medical Rec #: [id number]     Accession #:    [id number] Date of Birth: [birth year]      Patient Gender: F Patient Age:  37 years Exam Location:  [facility] Procedure:      VAS [place] LOWER EXTREMITY ARTERIAL DUPLEX Referring Phys: [facility] MOHAMED --------------------------------------------------------------------------------  Indications: Incidental finding on lower extremity venous examination- bilateral              poplital artery occlusion in the setting of pain for 1-2 weeks. New              paresthesia sensation- left worse than right. Other Factors: Stage 4 lung cancer.  Current ABI: N/A Comparison Study: No prior studies. Performing Technologist: Darlin Coco RDMS RVT  Examination Guidelines: A complete evaluation includes B-mode imaging, spectral Doppler, color Doppler, and power Doppler as needed of all accessible portions of each vessel. Bilateral testing is considered an integral part of a complete examination. Limited examinations for reoccurring indications may be performed as noted.  +-----------+--------+-----+--------+---------+-----------+ RIGHT      PSV cm/sRatioStenosisWaveform Comments    +-----------+--------+-----+--------+---------+-----------+ CFA Prox   74                   triphasic            +-----------+--------+-----+--------+---------+-----------+ CFA Mid    61                   triphasic             +-----------+--------+-----+--------+---------+-----------+ CFA Distal 59                   triphasic            +-----------+--------+-----+--------+---------+-----------+ DFA        74                   triphasic            +-----------+--------+-----+--------+---------+-----------+ SFA Prox   69                   triphasic            +-----------+--------+-----+--------+---------+-----------+ SFA Mid    76                   triphasic            +-----------+--------+-----+--------+---------+-----------+ SFA Distal 48                   triphasic            +-----------+--------+-----+--------+---------+-----------+ POP Prox   33                   triphasic            +-----------+--------+-----+--------+---------+-----------+ POP Mid                 occluded                     +-----------+--------+-----+--------+---------+-----------+ POP Distal              occluded                     +-----------+--------+-----+--------+---------+-----------+ TP Trunk                occluded                     +-----------+--------+-----+--------+---------+-----------+ ATA Prox   22                   biphasic             +-----------+--------+-----+--------+---------+-----------+  ATA Mid    16                   biphasic             +-----------+--------+-----+--------+---------+-----------+ ATA Distal 15                   biphasic             +-----------+--------+-----+--------+---------+-----------+ PTA Prox   12                   biphasic Collaterals +-----------+--------+-----+--------+---------+-----------+ PTA Mid    27                   biphasic             +-----------+--------+-----+--------+---------+-----------+ PTA Distal 27                   biphasic             +-----------+--------+-----+--------+---------+-----------+ PERO Prox               occluded                      +-----------+--------+-----+--------+---------+-----------+ PERO Mid                occluded                     +-----------+--------+-----+--------+---------+-----------+ PERO Distal             occluded                     +-----------+--------+-----+--------+---------+-----------+ DP         8                    biphasic             +-----------+--------+-----+--------+---------+-----------+  +-----------+--------+-----+--------+----------+--------------------+ LEFT       PSV cm/sRatioStenosisWaveform  Comments             +-----------+--------+-----+--------+----------+--------------------+ CFA Prox   57                   triphasic                      +-----------+--------+-----+--------+----------+--------------------+ CFA Mid    67                   triphasic                      +-----------+--------+-----+--------+----------+--------------------+ CFA Distal 71                   triphasic                      +-----------+--------+-----+--------+----------+--------------------+ DFA                     occluded                               +-----------+--------+-----+--------+----------+--------------------+ SFA Prox   69                   triphasic                      +-----------+--------+-----+--------+----------+--------------------+ SFA Mid    92  triphasic                      +-----------+--------+-----+--------+----------+--------------------+ SFA Distal 42                   triphasic                      +-----------+--------+-----+--------+----------+--------------------+ POP Prox   37                   triphasic                      +-----------+--------+-----+--------+----------+--------------------+ POP Mid                 occluded                               +-----------+--------+-----+--------+----------+--------------------+ POP Distal              occluded                                +-----------+--------+-----+--------+----------+--------------------+ TP Trunk                occluded                               +-----------+--------+-----+--------+----------+--------------------+ ATA Prox   41                   biphasic                       +-----------+--------+-----+--------+----------+--------------------+ ATA Mid    22                   biphasic                       +-----------+--------+-----+--------+----------+--------------------+ ATA Distal              occluded                               +-----------+--------+-----+--------+----------+--------------------+ PTA Prox                occluded          Collaterals prox-mid +-----------+--------+-----+--------+----------+--------------------+ PTA Mid    25                   monophasic                     +-----------+--------+-----+--------+----------+--------------------+ PTA Distal 23                   monophasic                     +-----------+--------+-----+--------+----------+--------------------+ PERO Prox               occluded                               +-----------+--------+-----+--------+----------+--------------------+ PERO Mid                occluded                               +-----------+--------+-----+--------+----------+--------------------+  PERO Distal25                             Collateral           +-----------+--------+-----+--------+----------+--------------------+ DP                      occluded                               +-----------+--------+-----+--------+----------+--------------------+  Summary: Right: Total occlusion noted in the popliteal artery and extending into the tibioperoneal trunk. Total occlusion noted in the peroneal artery. Absent great toe waveform. Left: Total occlusion noted in the deep femoral artery. Total occlusion noted in the mid to distal popliteal artery and extending into the  tibioperoneal trunk. Total occlusion noted in the distal segment of the anterior tibial artery. Total occlusion noted in the peroneal artery. Total occlusion noted in the dorsal pedis artery. Dampened great toe waveform.  See table(s) above for measurements and observations. Electronically signed by Harold Barban MD on 11/26/2022 at 11:48:53 PM.    Final    VAS [place] LOWER EXTREMITY VENOUS (DVT)  Result Date: 11/26/2022  Lower Venous DVT Study Patient Name:  SAOIRSE LEGERE  Date of Exam:   11/26/2022 Medical Rec #: [id number]     Accession #:    [id number] Date of Birth: 29-Apr-1986      Patient Gender: F Patient Age:   89 years Exam Location:  [facility] Procedure:      VAS [place] LOWER EXTREMITY VENOUS (DVT) Referring Phys: [facility] MOHAMED --------------------------------------------------------------------------------  Indications: Pain in bilateral lower extremities. Left worse than right. Patient reports pain for approximately 1-2 weeks, described as "shin splint" sensation, pain behind knees, and new numbness/tingling of feet.  Risk Factors: Cancer - Stage 4, Lung. Comparison Study: No prior studies. Performing Technologist: Darlin Coco RDMS, RVT  Examination Guidelines: A complete evaluation includes B-mode imaging, spectral Doppler, color Doppler, and power Doppler as needed of all accessible portions of each vessel. Bilateral testing is considered an integral part of a complete examination. Limited examinations for reoccurring indications may be performed as noted. The reflux portion of the exam is performed with the patient in reverse Trendelenburg.  +---------+---------------+---------+-----------+----------+--------------+ RIGHT    CompressibilityPhasicitySpontaneityPropertiesThrombus Aging +---------+---------------+---------+-----------+----------+--------------+ CFV      Full           Yes      Yes                                  +---------+---------------+---------+-----------+----------+--------------+ SFJ      Full                                                        +---------+---------------+---------+-----------+----------+--------------+ FV Prox  Full                                                        +---------+---------------+---------+-----------+----------+--------------+ FV Mid   Full                                                        +---------+---------------+---------+-----------+----------+--------------+  FV DistalFull                                                        +---------+---------------+---------+-----------+----------+--------------+ PFV      Full                                                        +---------+---------------+---------+-----------+----------+--------------+ POP      Full           Yes      Yes                                 +---------+---------------+---------+-----------+----------+--------------+ PTV      Full                                                        +---------+---------------+---------+-----------+----------+--------------+ PERO     Full                                                        +---------+---------------+---------+-----------+----------+--------------+   +---------+---------------+---------+-----------+----------+--------------+ LEFT     CompressibilityPhasicitySpontaneityPropertiesThrombus Aging +---------+---------------+---------+-----------+----------+--------------+ CFV      Full           Yes      Yes                                 +---------+---------------+---------+-----------+----------+--------------+ SFJ      Full                                                        +---------+---------------+---------+-----------+----------+--------------+ FV Prox  Full                                                         +---------+---------------+---------+-----------+----------+--------------+ FV Mid   Full                                                        +---------+---------------+---------+-----------+----------+--------------+ FV DistalFull                                                        +---------+---------------+---------+-----------+----------+--------------+   PFV      Full                                                        +---------+---------------+---------+-----------+----------+--------------+ POP      Full           Yes      Yes                                 +---------+---------------+---------+-----------+----------+--------------+ PTV      Full                                                        +---------+---------------+---------+-----------+----------+--------------+ PERO     Full                                                        +---------+---------------+---------+-----------+----------+--------------+     Summary: RIGHT: - There is no evidence of deep vein thrombosis in the lower extremity.  - No cystic structure found in the popliteal fossa.  - Occlusion of the RIGHT popliteal artery was noted incidentally on examination with collateralized PTA/DPA flow distally.  LEFT: - There is no evidence of deep vein thrombosis in the lower extremity.  - No cystic structure found in the popliteal fossa.  - - Occlusion of the LEFT popliteal artery was noted incidentally on examination with collateralized PTA/DPA flow distally.  *See table(s) above for measurements and observations. Electronically signed by Harold Barban MD on 11/26/2022 at 11:48:26 PM.    Final    DG Chest 1 View  Result Date: 11/26/2022 CLINICAL DATA:  Follow-up pleural effusion EXAM: CHEST  1 VIEW COMPARISON:  11/15/2022 FINDINGS: Right chest remains clear. Pleural catheter remains in place at the inferior pleural space on the left. There is a persistent pleural effusion on the left,  moderate in size, with atelectasis of the left lower lung. IMPRESSION: Persistent moderate left effusion with left lower lung atelectasis. Pleural catheter remains in place at the inferior left pleural space. Electronically Signed   By: Nelson Chimes M.D.   On: 11/26/2022 20:59   US OB LESS THAN 14 WEEKS WITH OB TRANSVAGINAL  Result Date: 11/18/2022 CLINICAL DATA:  Positive urine pregnancy test, IUD placement, evaluate for viability, cancer treatment candidate EXAM: OBSTETRIC <14 WK ULTRASOUND TECHNIQUE: Transabdominal ultrasound was performed for evaluation of the gestation as well as the maternal uterus and adnexal regions. COMPARISON:  None Available. FINDINGS: Intrauterine gestational sac: None Yolk sac:  Not Visualized. Embryo:  Not Visualized. Cardiac Activity: Not Visualized. Heart Rate: Not visualized. Subchorionic hemorrhage:  None visualized. Maternal uterus/adnexae: IUD positioned in the fundal endometrial cavity. IMPRESSION: 1. No candidate intrauterine gestation identified by ultrasound. No ultrasound evidence of adnexal mass or other findings to suggest ectopic pregnancy. Early pregnancy of unknown location. Recommend ectopic precautions and serial beta HCG to resolution. 2.  IUD positioned in the fundal endometrial cavity. Electronically Signed  By: Delanna Ahmadi M.D.   On: 11/18/2022 21:04   NM PET Image Initial (PI) Skull Base To Thigh (F-18 FDG)  Result Date: 11/15/2022 CLINICAL DATA:  Initial treatment strategy for non-small cell left lung cancer. EXAM: NUCLEAR MEDICINE PET SKULL BASE TO THIGH TECHNIQUE: 7.1 mCi F-18 FDG was injected intravenously. Full-ring PET imaging was performed from the skull base to thigh after the radiotracer. CT data was obtained and used for attenuation correction and anatomic localization. Fasting blood glucose: 152 mg/dl COMPARISON:  11/07/2022 CT chest, abdomen and pelvis. FINDINGS: Mediastinal blood pool activity: SUV max 1.8 Liver activity: SUV max NA NECK:  Hypermetabolic small bilateral supraclavicular lymph nodes, for example 0.6 cm on the right with max SUV 7.4 (series 4/image 46). Incidental CT findings: None. CHEST: Intensely hypermetabolic poorly defined left lower lobe 8.6 x 7.4 cm lung mass probably directly contiguous with the subpulmonic left pleural space with max SUV 16.0 (series 4/image 94). Moderate loculated malignant left pleural effusion with widespread hypermetabolic left pleural thickening/pleural soft tissue deposits with representative max SUV 11.4 at the medial apical left pleural space and representative max SUV 13.3 at the extreme peripheral basilar left pleural space. Intensely hypermetabolic bilateral mediastinal adenopathy involving the bilateral paratracheal, subcarinal, AP window, left prevascular, left pericardiophrenic and bilateral internal mammary nodal chains. Representative 2.1 cm right paratracheal node with max SUV 9.4 (series 4/image 62). Representative 1.6 cm left prevascular node with max SUV 9.8 (series 4/image 65). Representative 1.5 cm left internal mammary node with max SUV 12.4 (series 4/image 64). Hypermetabolic left hilar adenopathy with max SUV 12.0. No enlarged or hypermetabolic right axillary nodes. Mildly hypermetabolic nonenlarged 0.7 cm left axillary node with max SUV 3.1 (series 4/image 68). Three scattered solid right pulmonary nodules, largest 0.9 cm in the medial right middle lobe with low-level hypermetabolism with max SUV 2.0 (series 7/image 44). Incidental CT findings: Basilar left pleural drain in place. Moderate extrinsic narrowing of the central left upper and left lower lobe airways. ABDOMEN/PELVIS: Hypermetabolic 0.8 cm gastrohepatic ligament node with max SUV 6.4 (series 4/image 112). No abnormal hypermetabolic activity within the liver, pancreas, adrenal glands, or spleen. No hypermetabolic lymph nodes in the pelvis. Incidental CT findings: IUD appears grossly well-positioned in the uterine cavity.  SKELETON: No focal hypermetabolic activity to suggest skeletal metastasis. Generalized low level marrow uptake throughout the skeleton compatible with reactive marrow state. Incidental CT findings: None. IMPRESSION: 1. Intensely hypermetabolic poorly defined left lower lobe 8.6 cm lung mass probably directly contiguous with the subpulmonic left pleural space, compatible with primary bronchogenic malignancy. 2. Moderate loculated malignant left pleural effusion with widespread hypermetabolic left pleural thickening/pleural soft tissue deposits. 3. Hypermetabolic bilateral supraclavicular, bilateral mediastinal, left hilar and gastrohepatic ligament nodal metastases. 4. Three scattered solid right pulmonary nodules, largest 0.9 cm in the medial right middle lobe with low-level hypermetabolism, compatible with pulmonary metastases. Electronically Signed   By: Ilona Sorrel M.D.   On: 11/15/2022 16:52   DG Chest 2 View  Result Date: 11/15/2022 CLINICAL DATA:  Malignant pleural effusion. EXAM: CHEST - 2 VIEW COMPARISON:  AP chest 11/09/2022 and CT chest 11/07/2022 FINDINGS: Cardiac silhouette and mediastinal contours are within limits. Moderate left pleural effusion associated left basilar lung opacities, similar to prior 11/09/2022 radiograph and corresponding to the loculated pleural fluid within the left lung posterior, lateral, and major fissure pleura. The right lung appears clear. Likely unchanged tiny left apical pneumothorax, better seen on prior CT. No acute skeletal abnormality. IMPRESSION: Moderate left pleural  effusion associated left basilar lung opacities, similar to prior 11/09/2022 radiograph and corresponding to the loculated pleural fluid, lung atelectasis, and possible infrahilar lymphadenopathy/lung tumor better seen on prior CT. Electronically Signed   By: Yvonne Kendall M.D.   On: 11/15/2022 12:00     ASSESSMENT/PLAN:  This is a very pleasant 37 year old never smoker Caucasian female diagnosed  with stage IV (T4, N3, M1 B) non-small cell lung cancer, adenocarcinoma.  She presented with a large left diaphragmatic surface mass in addition to left hilar, infrahilar, AP window, right and left paratracheal, subcarinal, left internal mammary, and right gastric lymphadenopathy in addition to a solitary small right frontal calvarial osseous metastatic lesion with a large malignant left pleural effusion.  She was diagnosed in January 2024.  Her molecular studies show that she has an actionable mutation with ROS1 fusion.  She also has a PD-L1 expression of 98%.  She underwent a Pleurx catheter placement under the care of Dr. Valeta Harms.  She is currently draining this ***milliliters ***times per week.   The patient had bilateral lower extremity occlusion and underwent thrombectomy under the care of Dr. Luan Pulling.  She is currently on triple therapy with aspirin, Plavix, and Xarelto.  The patient is currently on targeted treatment with repotrectinib initially as 160 mg p.o. daily for 2 weeks followed by increase of the dose to 160 mg p.o. twice daily if she tolerated the first 2 weeks well.  Her first dose of treatment was on 2/***/24.  She has been tolerating this ***  The patient is here for a 2-week follow-up visit.  Labs were reviewed.  The patient was seen with Dr. Julien Nordmann today.  Recommend that she increase her dose to maintenance dose of 160 mg twice daily.   We will see her back for follow-up visit in 2 weeks for evaluation repeat blood work.   She will continue taking Tessalon Perles for her cough.  She will continue to be followed by the palliative care team for which she is currently taking gabapentin and oxycodone.   She will continue to have home health come out to her house.  The patient was advised to call immediately if she has any concerning symptoms in the interval. The patient voices understanding of current disease status and treatment options and is in agreement with the current  care plan. All questions were answered. The patient knows to call the clinic with any problems, questions or concerns. We can certainly see the patient much sooner if necessary      No orders of the defined types were placed in this encounter.    I spent {CHL ONC TIME VISIT - POEUM:[id number]} counseling the patient face to face. The total time spent in the appointment was {CHL ONC TIME VISIT - QMGQQ:[id number]}.  Terral Cooks L Arlen Dupuis, PA-C 12/09/22

## 2022-12-09 NOTE — Telephone Encounter (Signed)
Unable to refill per protocol, last refill by another provider.   Requested Prescriptions  Pending Prescriptions Disp Refills   magnesium oxide (MAG-OX) 400 (240 Mg) MG tablet [Pharmacy Med Name: MAGNESIUM OXIDE 400 MG TABLET] 60 tablet     Sig: TAKE 1 TABLET BY MOUTH TWICE A DAY     There is no refill protocol information for this order

## 2022-12-10 ENCOUNTER — Telehealth: Payer: Self-pay | Admitting: Medical Oncology

## 2022-12-10 ENCOUNTER — Encounter: Payer: Self-pay | Admitting: Internal Medicine

## 2022-12-10 DIAGNOSIS — Z9181 History of falling: Secondary | ICD-10-CM | POA: Diagnosis not present

## 2022-12-10 DIAGNOSIS — F419 Anxiety disorder, unspecified: Secondary | ICD-10-CM | POA: Diagnosis not present

## 2022-12-10 DIAGNOSIS — J910 Malignant pleural effusion: Secondary | ICD-10-CM | POA: Diagnosis not present

## 2022-12-10 DIAGNOSIS — C3492 Malignant neoplasm of unspecified part of left bronchus or lung: Secondary | ICD-10-CM | POA: Diagnosis not present

## 2022-12-10 DIAGNOSIS — Z7951 Long term (current) use of inhaled steroids: Secondary | ICD-10-CM | POA: Diagnosis not present

## 2022-12-10 DIAGNOSIS — Z8701 Personal history of pneumonia (recurrent): Secondary | ICD-10-CM | POA: Diagnosis not present

## 2022-12-10 DIAGNOSIS — D72829 Elevated white blood cell count, unspecified: Secondary | ICD-10-CM | POA: Diagnosis not present

## 2022-12-10 DIAGNOSIS — D630 Anemia in neoplastic disease: Secondary | ICD-10-CM | POA: Diagnosis not present

## 2022-12-10 DIAGNOSIS — Z48813 Encounter for surgical aftercare following surgery on the respiratory system: Secondary | ICD-10-CM | POA: Diagnosis not present

## 2022-12-10 DIAGNOSIS — Z4801 Encounter for change or removal of surgical wound dressing: Secondary | ICD-10-CM | POA: Diagnosis not present

## 2022-12-10 MED ORDER — PANTOPRAZOLE SODIUM 40 MG PO TBEC: 40.0000 mg | DELAYED_RELEASE_TABLET | Freq: Every day | ORAL | 3 refills | Status: DC

## 2022-12-10 NOTE — Telephone Encounter (Signed)
Pt had labs drawn today at [facility].

## 2022-12-10 NOTE — Telephone Encounter (Signed)
Oral Chemotherapy Pharmacist Encounter   Called [employer] at [phone] to check the status of patient's [employer] patient assistance application.   Spoke to [employer] who stated that since they received the patient's insurance cards it has marked as under review. Asked representative about estimated time frame that we would hear back about final decision, and she stated likely within 24-48 hours. Reminded representative that as of 12/11/22 patient will have been on therapy through free trial for 14 days, and that a decision is needed ASAP.   Oral chemotherapy clinic will continue to follow.   Leron Croak, PharmD, BCPS, BCOP Hematology/Oncology Clinical Pharmacist [facility] [phone] 12/10/2022 11:02 AM

## 2022-12-10 NOTE — Telephone Encounter (Signed)
COVID positive  yesterday. Pt scheduled for video visit tomorrow.

## 2022-12-11 ENCOUNTER — Telehealth: Payer: Self-pay | Admitting: Pharmacy Technician

## 2022-12-11 ENCOUNTER — Other Ambulatory Visit (HOSPITAL_COMMUNITY): Payer: Self-pay

## 2022-12-11 ENCOUNTER — Telehealth: Payer: Self-pay | Admitting: Physician Assistant

## 2022-12-11 ENCOUNTER — Inpatient Hospital Stay: Payer: BC Managed Care – PPO | Admitting: Nurse Practitioner

## 2022-12-11 ENCOUNTER — Inpatient Hospital Stay (HOSPITAL_BASED_OUTPATIENT_CLINIC_OR_DEPARTMENT_OTHER): Payer: BC Managed Care – PPO | Admitting: Physician Assistant

## 2022-12-11 ENCOUNTER — Inpatient Hospital Stay: Payer: BC Managed Care – PPO

## 2022-12-11 DIAGNOSIS — I70229 Atherosclerosis of native arteries of extremities with rest pain, unspecified extremity: Secondary | ICD-10-CM

## 2022-12-11 DIAGNOSIS — C3492 Malignant neoplasm of unspecified part of left bronchus or lung: Secondary | ICD-10-CM

## 2022-12-11 MED ORDER — INTEGRA PLUS PO CAPS: 1.0000 | ORAL_CAPSULE | Freq: Every morning | ORAL | 2 refills | Status: DC

## 2022-12-11 NOTE — Telephone Encounter (Signed)
I called the patient to review her lab work from yesterday.  I also reviewed her lab work with Dr. Julien Nordmann.  The patient's 2-week follow-up labs show slightly worsening anemia with a hemoglobin of 8.6.  The patient's white blood cell count is normal at 5.9.  Her platelet count; however; is 1196K.  I reviewed this with Dr. Julien Nordmann.  Dr. Julien Nordmann feels that this is likely reactive due to her worsening anemia and recommend that she take an iron supplement.  We will recheck this in 2-week with her next set of labs.  The patient is currently already on aspirin, Plavix, and Xarelto.  I called the patient and let her know that she is okay to start the maintenance dose of repotrectinib tomorrow. Her CMP was completely within normal limits.  Her uric acid was also normal at 3.0.  I have sent a prescription for Integra plus to her pharmacy.  If for some reason this is not covered by insurance, then she will pick up a over-the-counter ferrous sulfate.  She is already taking vitamin C and she was encouraged to take her iron with vitamin C supplements.  She knows to call us with any concerns in the interval.

## 2022-12-11 NOTE — Telephone Encounter (Signed)
Oral Oncology Patient Advocate Encounter  The team was forwarded a fax from [facility] by Mendel Ryder at [facility].   The fax states that the patient has been denied for PAP for Augtyro  due to suspicion that their insurance plan is an AFP (Alternate Funding Plan) or AFP-like plan. This type of plan "improperly shifts the insurance obligation to charitable free drug programs...".   After speaking with patient's pharmacy benefit, [employer], I was able to receive a statement showing that Augtyro is not on formulary, nor can the formulary be excepted.   This letter was e-faxed to [phone]  I will continue to follow until final determination.  [name], CPhT-Adv Oncology Pharmacy Patient [place] Direct Number: [phone]  Fax: [phone]

## 2022-12-12 ENCOUNTER — Other Ambulatory Visit: Payer: Self-pay | Admitting: Nurse Practitioner

## 2022-12-12 ENCOUNTER — Telehealth: Payer: Self-pay | Admitting: Internal Medicine

## 2022-12-12 DIAGNOSIS — F419 Anxiety disorder, unspecified: Secondary | ICD-10-CM

## 2022-12-12 DIAGNOSIS — F32A Depression, unspecified: Secondary | ICD-10-CM

## 2022-12-12 DIAGNOSIS — C3492 Malignant neoplasm of unspecified part of left bronchus or lung: Secondary | ICD-10-CM

## 2022-12-12 DIAGNOSIS — Z515 Encounter for palliative care: Secondary | ICD-10-CM

## 2022-12-12 NOTE — Telephone Encounter (Signed)
Oral Chemotherapy Pharmacist Encounter   Spoke with patient regarding update on trying to appeal [employer]'s decision to deny assistance coverage for Augtyro due [employer] thinking patient has an insurance plan that falls under an "Alternate Funding Plan". Patient's case is being escalated within [employer] at this time. Per our account and reimbursement manager through [facility], since patient is commercially insured and we are actively working with her prescription insurance to sort out the coverage issues to the best of our abilities, patient would be a candidate for the bridge program, that would allow her to stay on drug for 1-2 months after her free trial offer prescription is completed.  Melissa Pineda is aware that Melissa Pineda, CPhT faxed off [employer] fax stating Melissa Pineda could not be processed through a prior authorization due to medication not being on her prescription plans formulary.   Patient expressed appreciation and understanding - I will continue to update patient as we have more information available.   Melissa Pineda, PharmD, BCPS, BCOP Hematology/Oncology Clinical Pharmacist Melissa Pineda and [name] [phone] 12/12/2022 3:06 PM

## 2022-12-12 NOTE — Telephone Encounter (Signed)
Scheduled per 02/21 los, patient has been called and notified.

## 2022-12-13 ENCOUNTER — Encounter: Payer: Self-pay | Admitting: Pulmonary Disease

## 2022-12-13 ENCOUNTER — Ambulatory Visit (INDEPENDENT_AMBULATORY_CARE_PROVIDER_SITE_OTHER): Payer: BC Managed Care – PPO

## 2022-12-13 ENCOUNTER — Ambulatory Visit (INDEPENDENT_AMBULATORY_CARE_PROVIDER_SITE_OTHER): Payer: BC Managed Care – PPO | Admitting: Pulmonary Disease

## 2022-12-13 VITALS — BP 120/80 | HR 100 | Ht 66.0 in | Wt 139.0 lb

## 2022-12-13 DIAGNOSIS — C3490 Malignant neoplasm of unspecified part of unspecified bronchus or lung: Secondary | ICD-10-CM | POA: Diagnosis not present

## 2022-12-13 DIAGNOSIS — J90 Pleural effusion, not elsewhere classified: Secondary | ICD-10-CM | POA: Diagnosis not present

## 2022-12-13 DIAGNOSIS — J910 Malignant pleural effusion: Secondary | ICD-10-CM

## 2022-12-13 DIAGNOSIS — C3492 Malignant neoplasm of unspecified part of left bronchus or lung: Secondary | ICD-10-CM | POA: Diagnosis not present

## 2022-12-13 NOTE — Progress Notes (Signed)
Synopsis: Referred in February 2024 for stage IV lung cancer, metastatic adenocarcinoma with malignant pleural effusion by No ref. provider found  Subjective:   PATIENT ID: Melissa Pineda GENDER: female DOB: [birth year], MRN: [id number]  Chief Complaint  Patient presents with   Follow-up    F/up cath.    This is a 37 year old female no significant past medical history. Patient initially presented to the emergency department in January 2024.  CT imaging revealed a large pleural effusion.  Patient underwent thoracentesis and diagnosis of adenocarcinoma of the lung.  Molecular testing revealed that she was EZR-ROS1 fusion positive.  She has been approved for treatment with repotrectinib.  She started this medication at the beginning of the month.  Once she started the medication her pleural effusion has almost since dried up.  Her course has been complicated.  She was found to have a left atrial thrombus with embolic disease to the legs.  She underwent lower extremity embolectomy by vascular surgery during her recent hospitalization.  She feels much better after starting treatments.  She still has a Pleurx catheter in place in the left chest.  We examined the site today.  It appears clean dry intact with no evidence of erythema or concern for infection.  She is only been draining about 30 cc/day when she drains.  She has been draining approximately every 3 days.  She is however now placed on triple therapy aspirin Plavix and Xarelto for anticoagulation in the setting of the development of arterial thrombi.    Past Medical History:  Diagnosis Date   Anxiety    Phreesia 11/21/2020   Cancer [facility])      Family History  Problem Relation Age of Onset   Cancer Mother    Cancer Maternal Grandmother    Cancer Maternal Grandfather    Hypertension Paternal Grandmother    Cancer Paternal Grandfather      Past Surgical History:  Procedure Laterality Date   ABDOMINAL AORTOGRAM W/LOWER EXTREMITY N/A  11/27/2022   Procedure: ABDOMINAL AORTOGRAM W/LOWER EXTREMITY;  Surgeon: Cherre Robins, MD;  Location: [place] [facility];  Service: Cardiovascular;  Laterality: N/A;   IR PERC PLEURAL DRAIN W/INDWELL CATH W/IMG GUIDE  11/09/2022   PERIPHERAL VASCULAR ATHERECTOMY  11/27/2022   Procedure: PERIPHERAL VASCULAR ATHERECTOMY;  Surgeon: Cherre Robins, MD;  Location: [place] [facility];  Service: Cardiovascular;;   PERIPHERAL VASCULAR THROMBECTOMY  11/27/2022   Procedure: PERIPHERAL VASCULAR THROMBECTOMY;  Surgeon: Cherre Robins, MD;  Location: [place] [facility];  Service: Cardiovascular;;   WISDOM TOOTH EXTRACTION      Social History   Socioeconomic History   Marital status: Married    Spouse name: Not on file   Number of children: Not on file   Years of education: Not on file   Highest education level: Not on file  Occupational History   Not on file  Tobacco Use   Smoking status: Never   Smokeless tobacco: Never  Vaping Use   Vaping Use: Never used  Substance and Sexual Activity   Alcohol use: Yes    Alcohol/week: 7.0 standard drinks of alcohol    Types: 7 Glasses of wine per week    Comment: 1 glass wine in the evening    Drug use: Yes    Types: Marijuana    Comment: occasional   Sexual activity: Yes    Birth control/protection: None  Other Topics Concern   Not on file  Social History Narrative   Not  on file   Social Determinants of Health   Financial Resource Strain: Not on file  Food Insecurity: No Food Insecurity (11/26/2022)   Hunger Vital Sign    Worried About Running Out of Food in the Last Year: Never true    Ran Out of Food in the Last Year: Never true  Transportation Needs: No Transportation Needs (11/26/2022)   PRAPARE - Hydrologist (Medical): No    Lack of Transportation (Non-Medical): No  Physical Activity: Not on file  Stress: Not on file  Social Connections: Not on file  Intimate Partner Violence: Not At Risk (11/26/2022)    Humiliation, Afraid, Rape, and Kick questionnaire    Fear of Current or Ex-Partner: No    Emotionally Abused: No    Physically Abused: No    Sexually Abused: No     Allergies  Allergen Reactions   Hydrocodone Nausea Only    Dizzy, "crawl out of my skin"   Tramadol Other (See Comments)    "Weird feeling"     Outpatient Medications Prior to Visit  Medication Sig Dispense Refill   acetaminophen (TYLENOL) 325 MG tablet Take 650 mg by mouth every 6 (six) hours as needed for mild pain (for mild pain.).     ALPRAZolam (XANAX) 0.5 MG tablet Take 1 tablet (0.5 mg total) by mouth 3 (three) times daily as needed for anxiety. (Patient taking differently: Take 0.5 mg by mouth daily as needed for anxiety.) 60 tablet 0   aspirin EC 81 MG tablet Take 1 tablet (81 mg total) by mouth daily. Swallow whole. 30 tablet 12   Cholecalciferol (VITAMIN D-3) 125 MCG (5000 UT) TABS Take 1 tablet by mouth daily.     clopidogrel (PLAVIX) 75 MG tablet Take 1 tablet (75 mg total) by mouth daily. 30 tablet 11   FeFum-FePoly-FA-B Cmp-C-Biot (INTEGRA PLUS) CAPS Take 1 capsule by mouth every morning. 30 capsule 2   gabapentin (NEURONTIN) 100 MG capsule Take 100 mg by mouth 2 (two) times daily.  Start 100 mg twice a day x 3 days, if tolerating, increase to 200 mg twice a day. Take 2 capsules (200 mg total) by mouth 2 (two) times daily     levonorgestrel (MIRENA, 52 MG,) 20 MCG/DAY IUD 1 each by Intrauterine route once.     metoCLOPramide (REGLAN) 5 MG tablet Take 1 tablet (5 mg total) by mouth every 6 (six) hours as needed for nausea. 30 tablet 1   ondansetron (ZOFRAN) 8 MG tablet Take 1 tablet (8 mg total) by mouth every 8 (eight) hours as needed for nausea or vomiting. 20 tablet 0   OVER THE COUNTER MEDICATION Take 1,500 mg by mouth 2 (two) times daily. Beet root     oxyCODONE (ROXICODONE) 5 MG immediate release tablet Take 1 tablet (5 mg total) by mouth every 4 (four) hours as needed for severe pain. (Patient taking  differently: Take 5 mg by mouth every 6 (six) hours as needed for severe pain.) 120 tablet 0   pantoprazole (PROTONIX) 40 MG tablet Take 1 tablet (40 mg total) by mouth daily. 30 tablet 3   polyethylene glycol (MIRALAX / GLYCOLAX) 17 g packet Take 17 g by mouth daily.     Repotrectinib 40 MG CAPS Take 160 mg by mouth daily. 56 capsule 0   Rivaroxaban (XARELTO) 15 MG TABS tablet Take 1 tablet (15 mg total) by mouth 2 (two) times daily with a meal. 42 tablet 0   [START ON  12/19/2022] rivaroxaban (XARELTO) 20 MG TABS tablet Take 1 tablet (20 mg total) by mouth daily with supper. 30 tablet 3   Turmeric (QC TUMERIC COMPLEX PO) Take 500 mg by mouth daily.     zinc sulfate 220 (50 Zn) MG capsule Take 220 mg by mouth daily.     benzonatate (TESSALON) 200 MG capsule Take 1 capsule (200 mg total) by mouth 4 (four) times daily as needed for cough. (Patient not taking: Reported on 12/13/2022) 120 capsule 2   Magnesium Oxide -Mg Supplement 400 MG CAPS Take 400 mg by mouth 2 (two) times daily. (Patient not taking: Reported on 11/27/2022) 60 capsule 0   ondansetron (ZOFRAN-ODT) 8 MG disintegrating tablet Take 1 tablet (8 mg total) by mouth every 8 (eight) hours as needed for nausea or vomiting. (Patient not taking: Reported on 12/13/2022) 20 tablet 1   Ondansetron 4 MG FILM Take 4-8 mg by mouth every 6 (six) hours as needed. (Patient not taking: Reported on 12/13/2022) 15 each 0   Facility-Administered Medications Prior to Visit  Medication Dose Route Frequency Provider Last Rate Last Admin   famotidine (PEPCID) tablet 20 mg  20 mg Oral Once Acquanetta Chain, DO        Review of Systems  Constitutional:  Positive for malaise/fatigue. Negative for chills, fever and weight loss.  HENT:  Negative for hearing loss, sore throat and tinnitus.   Eyes:  Negative for blurred vision and double vision.  Respiratory:  Positive for shortness of breath. Negative for cough, hemoptysis, sputum production, wheezing and stridor.    Cardiovascular:  Negative for chest pain, palpitations, orthopnea, leg swelling and PND.  Gastrointestinal:  Negative for abdominal pain, constipation, diarrhea, heartburn, nausea and vomiting.  Genitourinary:  Negative for dysuria, hematuria and urgency.  Musculoskeletal:  Negative for joint pain and myalgias.  Skin:  Negative for itching and rash.  Neurological:  Negative for dizziness, tingling, weakness and headaches.  Endo/Heme/Allergies:  Negative for environmental allergies. Does not bruise/bleed easily.  Psychiatric/Behavioral:  Negative for depression. The patient is not nervous/anxious and does not have insomnia.   All other systems reviewed and are negative.    Objective:  Physical Exam Vitals reviewed.  Constitutional:      General: She is not in acute distress.    Appearance: She is well-developed.  HENT:     Head: Normocephalic and atraumatic.  Eyes:     General: No scleral icterus.    Conjunctiva/sclera: Conjunctivae normal.     Pupils: Pupils are equal, round, and reactive to light.  Neck:     Vascular: No JVD.     Trachea: No tracheal deviation.  Cardiovascular:     Rate and Rhythm: Normal rate and regular rhythm.     Heart sounds: Normal heart sounds. No murmur heard. Pulmonary:     Effort: Pulmonary effort is normal. No tachypnea, accessory muscle usage or respiratory distress.     Breath sounds: No stridor. No wheezing, rhonchi or rales.  Abdominal:     General: There is no distension.     Palpations: Abdomen is soft.     Tenderness: There is no abdominal tenderness.  Musculoskeletal:        General: No tenderness.     Cervical back: Neck supple.  Lymphadenopathy:     Cervical: No cervical adenopathy.  Skin:    General: Skin is warm and dry.     Capillary Refill: Capillary refill takes less than 2 seconds.     Findings: No  rash.  Neurological:     Mental Status: She is alert and oriented to person, place, and time.  Psychiatric:        Behavior:  Behavior normal.      Vitals:   12/13/22 1404  BP: 120/80  Pulse: 100  SpO2: 99%  Weight: 139 lb (63 kg)  Height: '5\' 6"'$  (1.676 m)   99% on RA BMI Readings from Last 3 Encounters:  12/13/22 22.44 kg/m  11/26/22 20.99 kg/m  11/26/22 20.98 kg/m   Wt Readings from Last 3 Encounters:  12/13/22 139 lb (63 kg)  11/26/22 130 lb 1.1 oz (59 kg)  11/26/22 130 lb (59 kg)     CBC    Component Value Date/Time   WBC 10.2 11/28/2022 0108   RBC 3.13 (L) 11/28/2022 0108   HGB 9.1 (L) 11/28/2022 0108   HGB 15.4 (H) 11/20/2022 0906   HGB 12.6 11/24/2020 1106   HCT 26.2 (L) 11/28/2022 0108   HCT 38.0 11/24/2020 1106   PLT 312 11/28/2022 0108   PLT 142 (L) 11/20/2022 0906   PLT 314 11/24/2020 1106   MCV 83.7 11/28/2022 0108   MCV 92 11/24/2020 1106   MCH 29.1 11/28/2022 0108   MCHC 34.7 11/28/2022 0108   RDW 13.9 11/28/2022 0108   RDW 11.8 11/24/2020 1106   LYMPHSABS 1.8 11/26/2022 1158   LYMPHSABS 2.7 11/24/2020 1106   MONOABS 1.3 (H) 11/26/2022 1158   EOSABS 0.4 11/26/2022 1158   EOSABS 0.2 11/24/2020 1106   BASOSABS 0.1 11/26/2022 1158   BASOSABS 0.0 11/24/2020 1106     Chest Imaging: Chest x-ray 12/13/2022: Improved aeration in the left lower lung.  Chest tube in place no significant effusion. The patient's images have been independently reviewed by me.    Pulmonary Functions Testing Results:     No data to display          FeNO:   Pathology:   Echocardiogram:   Heart Catheterization:     Assessment & Plan:     ICD-10-CM   1. Pleural effusion  J90 DG Chest 2 View    Ambulatory referral to Pulmonology    2. Malignant pleural effusion  J91.0     3. Adenocarcinoma of left lung, stage 4 (HCC)  C34.92       Discussion:  This is a 37 year old female with stage IV adenocarcinoma of the lung positive ERZ-ROS1 fusion.  Currently on targeted therapy.  She has responded well to this.  Her pleural effusion is nonexistent at this point.  She is draining  just a minimal amount of pleural fluid every couple of days.  Plan: We will plan to remove her Pleurx catheter. She is on anticoagulation due to arterial thrombi.  I do not think that we will be able to stop her anticoagulation for any period of time to be able to remove the Pleurx.  I did explain there may be a slight higher risk for the development of bleeding at the insertion site but we will do our best to control this with removal of the Pleurx catheter. Patient is agreeable and understands that there may be some added risk but we do not want to stop that in the setting of recent arterial thrombosis. We will tentatively schedule for removal coming up in March.  This will be completed as an outpatient procedure at [facility] endoscopy.    Current Outpatient Medications:    acetaminophen (TYLENOL) 325 MG tablet, Take 650 mg by mouth every 6 (  six) hours as needed for mild pain (for mild pain.)., Disp: , Rfl:    ALPRAZolam (XANAX) 0.5 MG tablet, Take 1 tablet (0.5 mg total) by mouth 3 (three) times daily as needed for anxiety. (Patient taking differently: Take 0.5 mg by mouth daily as needed for anxiety.), Disp: 60 tablet, Rfl: 0   aspirin EC 81 MG tablet, Take 1 tablet (81 mg total) by mouth daily. Swallow whole., Disp: 30 tablet, Rfl: 12   Cholecalciferol (VITAMIN D-3) 125 MCG (5000 UT) TABS, Take 1 tablet by mouth daily., Disp: , Rfl:    clopidogrel (PLAVIX) 75 MG tablet, Take 1 tablet (75 mg total) by mouth daily., Disp: 30 tablet, Rfl: 11   FeFum-FePoly-FA-B Cmp-C-Biot (INTEGRA PLUS) CAPS, Take 1 capsule by mouth every morning., Disp: 30 capsule, Rfl: 2   gabapentin (NEURONTIN) 100 MG capsule, Take 100 mg by mouth 2 (two) times daily.  Start 100 mg twice a day x 3 days, if tolerating, increase to 200 mg twice a day. Take 2 capsules (200 mg total) by mouth 2 (two) times daily, Disp: , Rfl:    levonorgestrel (MIRENA, 52 MG,) 20 MCG/DAY IUD, 1 each by Intrauterine route once., Disp: , Rfl:     metoCLOPramide (REGLAN) 5 MG tablet, Take 1 tablet (5 mg total) by mouth every 6 (six) hours as needed for nausea., Disp: 30 tablet, Rfl: 1   ondansetron (ZOFRAN) 8 MG tablet, Take 1 tablet (8 mg total) by mouth every 8 (eight) hours as needed for nausea or vomiting., Disp: 20 tablet, Rfl: 0   OVER THE COUNTER MEDICATION, Take 1,500 mg by mouth 2 (two) times daily. Beet root, Disp: , Rfl:    oxyCODONE (ROXICODONE) 5 MG immediate release tablet, Take 1 tablet (5 mg total) by mouth every 4 (four) hours as needed for severe pain. (Patient taking differently: Take 5 mg by mouth every 6 (six) hours as needed for severe pain.), Disp: 120 tablet, Rfl: 0   pantoprazole (PROTONIX) 40 MG tablet, Take 1 tablet (40 mg total) by mouth daily., Disp: 30 tablet, Rfl: 3   polyethylene glycol (MIRALAX / GLYCOLAX) 17 g packet, Take 17 g by mouth daily., Disp: , Rfl:    Repotrectinib 40 MG CAPS, Take 160 mg by mouth daily., Disp: 56 capsule, Rfl: 0   Rivaroxaban (XARELTO) 15 MG TABS tablet, Take 1 tablet (15 mg total) by mouth 2 (two) times daily with a meal., Disp: 42 tablet, Rfl: 0   [START ON 12/19/2022] rivaroxaban (XARELTO) 20 MG TABS tablet, Take 1 tablet (20 mg total) by mouth daily with supper., Disp: 30 tablet, Rfl: 3   Turmeric (QC TUMERIC COMPLEX PO), Take 500 mg by mouth daily., Disp: , Rfl:    zinc sulfate 220 (50 Zn) MG capsule, Take 220 mg by mouth daily., Disp: , Rfl:    benzonatate (TESSALON) 200 MG capsule, Take 1 capsule (200 mg total) by mouth 4 (four) times daily as needed for cough. (Patient not taking: Reported on 12/13/2022), Disp: 120 capsule, Rfl: 2   Magnesium Oxide -Mg Supplement 400 MG CAPS, Take 400 mg by mouth 2 (two) times daily. (Patient not taking: Reported on 11/27/2022), Disp: 60 capsule, Rfl: 0   ondansetron (ZOFRAN-ODT) 8 MG disintegrating tablet, Take 1 tablet (8 mg total) by mouth every 8 (eight) hours as needed for nausea or vomiting. (Patient not taking: Reported on 12/13/2022), Disp:  20 tablet, Rfl: 1   Ondansetron 4 MG FILM, Take 4-8 mg by mouth every 6 (six)  hours as needed. (Patient not taking: Reported on 12/13/2022), Disp: 15 each, Rfl: 0  Current Facility-Administered Medications:    famotidine (PEPCID) tablet 20 mg, 20 mg, Oral, Once, Acquanetta Chain, DO   Garner Nash, DO [facility] 12/13/2022 5:42 PM

## 2022-12-13 NOTE — Patient Instructions (Signed)
Thank you for visiting Dr. Valeta Harms at [facility]. Today we recommend the following: Orders Placed This Encounter  Procedures   DG Chest 2 View   Ambulatory referral to Pulmonology   3/5 to remove IPC at [facility]   Return if symptoms worsen or fail to improve.    Please do your part to reduce the spread of COVID-19.

## 2022-12-13 NOTE — H&P (View-Only) (Signed)
Synopsis: Referred in February 2024 for stage IV lung cancer, metastatic adenocarcinoma with malignant pleural effusion by No ref. provider found  Subjective:   PATIENT ID: Melissa Pineda GENDER: female DOB: 07-19-1986, MRN: [id number]  Chief Complaint  Patient presents with   Follow-up    F/up cath.    This is a 37 year old female no significant past medical history. Patient initially presented to the emergency department in January 2024.  CT imaging revealed a large pleural effusion.  Patient underwent thoracentesis and diagnosis of adenocarcinoma of the lung.  Molecular testing revealed that she was EZR-ROS1 fusion positive.  She has been approved for treatment with repotrectinib.  She started this medication at the beginning of the month.  Once she started the medication her pleural effusion has almost since dried up.  Her course has been complicated.  She was found to have a left atrial thrombus with embolic disease to the legs.  She underwent lower extremity embolectomy by vascular surgery during her recent hospitalization.  She feels much better after starting treatments.  She still has a Pleurx catheter in place in the left chest.  We examined the site today.  It appears clean dry intact with no evidence of erythema or concern for infection.  She is only been draining about 30 cc/day when she drains.  She has been draining approximately every 3 days.  She is however now placed on triple therapy aspirin Plavix and Xarelto for anticoagulation in the setting of the development of arterial thrombi.    Past Medical History:  Diagnosis Date   Anxiety    Phreesia 11/21/2020   Cancer [facility])      Family History  Problem Relation Age of Onset   Cancer Mother    Cancer Maternal Grandmother    Cancer Maternal Grandfather    Hypertension Paternal Grandmother    Cancer Paternal Grandfather      Past Surgical History:  Procedure Laterality Date   ABDOMINAL AORTOGRAM W/LOWER EXTREMITY N/A  11/27/2022   Procedure: ABDOMINAL AORTOGRAM W/LOWER EXTREMITY;  Surgeon: Cherre Robins, MD;  Location: [place] [facility];  Service: Cardiovascular;  Laterality: N/A;   IR PERC PLEURAL DRAIN W/INDWELL CATH W/IMG GUIDE  11/09/2022   PERIPHERAL VASCULAR ATHERECTOMY  11/27/2022   Procedure: PERIPHERAL VASCULAR ATHERECTOMY;  Surgeon: Cherre Robins, MD;  Location: [place] [facility];  Service: Cardiovascular;;   PERIPHERAL VASCULAR THROMBECTOMY  11/27/2022   Procedure: PERIPHERAL VASCULAR THROMBECTOMY;  Surgeon: Cherre Robins, MD;  Location: [place] [facility];  Service: Cardiovascular;;   WISDOM TOOTH EXTRACTION      Social History   Socioeconomic History   Marital status: Married    Spouse name: Not on file   Number of children: Not on file   Years of education: Not on file   Highest education level: Not on file  Occupational History   Not on file  Tobacco Use   Smoking status: Never   Smokeless tobacco: Never  Vaping Use   Vaping Use: Never used  Substance and Sexual Activity   Alcohol use: Yes    Alcohol/week: 7.0 standard drinks of alcohol    Types: 7 Glasses of wine per week    Comment: 1 glass wine in the evening    Drug use: Yes    Types: Marijuana    Comment: occasional   Sexual activity: Yes    Birth control/protection: None  Other Topics Concern   Not on file  Social History Narrative   Not  on file   Social Determinants of Health   Financial Resource Strain: Not on file  Food Insecurity: No Food Insecurity (11/26/2022)   Hunger Vital Sign    Worried About Running Out of Food in the Last Year: Never true    Ran Out of Food in the Last Year: Never true  Transportation Needs: No Transportation Needs (11/26/2022)   PRAPARE - Hydrologist (Medical): No    Lack of Transportation (Non-Medical): No  Physical Activity: Not on file  Stress: Not on file  Social Connections: Not on file  Intimate Partner Violence: Not At Risk (11/26/2022)    Humiliation, Afraid, Rape, and Kick questionnaire    Fear of Current or Ex-Partner: No    Emotionally Abused: No    Physically Abused: No    Sexually Abused: No     Allergies  Allergen Reactions   Hydrocodone Nausea Only    Dizzy, "crawl out of my skin"   Tramadol Other (See Comments)    "Weird feeling"     Outpatient Medications Prior to Visit  Medication Sig Dispense Refill   acetaminophen (TYLENOL) 325 MG tablet Take 650 mg by mouth every 6 (six) hours as needed for mild pain (for mild pain.).     ALPRAZolam (XANAX) 0.5 MG tablet Take 1 tablet (0.5 mg total) by mouth 3 (three) times daily as needed for anxiety. (Patient taking differently: Take 0.5 mg by mouth daily as needed for anxiety.) 60 tablet 0   aspirin EC 81 MG tablet Take 1 tablet (81 mg total) by mouth daily. Swallow whole. 30 tablet 12   Cholecalciferol (VITAMIN D-3) 125 MCG (5000 UT) TABS Take 1 tablet by mouth daily.     clopidogrel (PLAVIX) 75 MG tablet Take 1 tablet (75 mg total) by mouth daily. 30 tablet 11   FeFum-FePoly-FA-B Cmp-C-Biot (INTEGRA PLUS) CAPS Take 1 capsule by mouth every morning. 30 capsule 2   gabapentin (NEURONTIN) 100 MG capsule Take 100 mg by mouth 2 (two) times daily.  Start 100 mg twice a day x 3 days, if tolerating, increase to 200 mg twice a day. Take 2 capsules (200 mg total) by mouth 2 (two) times daily     levonorgestrel (MIRENA, 52 MG,) 20 MCG/DAY IUD 1 each by Intrauterine route once.     metoCLOPramide (REGLAN) 5 MG tablet Take 1 tablet (5 mg total) by mouth every 6 (six) hours as needed for nausea. 30 tablet 1   ondansetron (ZOFRAN) 8 MG tablet Take 1 tablet (8 mg total) by mouth every 8 (eight) hours as needed for nausea or vomiting. 20 tablet 0   OVER THE COUNTER MEDICATION Take 1,500 mg by mouth 2 (two) times daily. Beet root     oxyCODONE (ROXICODONE) 5 MG immediate release tablet Take 1 tablet (5 mg total) by mouth every 4 (four) hours as needed for severe pain. (Patient taking  differently: Take 5 mg by mouth every 6 (six) hours as needed for severe pain.) 120 tablet 0   pantoprazole (PROTONIX) 40 MG tablet Take 1 tablet (40 mg total) by mouth daily. 30 tablet 3   polyethylene glycol (MIRALAX / GLYCOLAX) 17 g packet Take 17 g by mouth daily.     Repotrectinib 40 MG CAPS Take 160 mg by mouth daily. 56 capsule 0   Rivaroxaban (XARELTO) 15 MG TABS tablet Take 1 tablet (15 mg total) by mouth 2 (two) times daily with a meal. 42 tablet 0   [START ON  12/19/2022] rivaroxaban (XARELTO) 20 MG TABS tablet Take 1 tablet (20 mg total) by mouth daily with supper. 30 tablet 3   Turmeric (QC TUMERIC COMPLEX PO) Take 500 mg by mouth daily.     zinc sulfate 220 (50 Zn) MG capsule Take 220 mg by mouth daily.     benzonatate (TESSALON) 200 MG capsule Take 1 capsule (200 mg total) by mouth 4 (four) times daily as needed for cough. (Patient not taking: Reported on 12/13/2022) 120 capsule 2   Magnesium Oxide -Mg Supplement 400 MG CAPS Take 400 mg by mouth 2 (two) times daily. (Patient not taking: Reported on 11/27/2022) 60 capsule 0   ondansetron (ZOFRAN-ODT) 8 MG disintegrating tablet Take 1 tablet (8 mg total) by mouth every 8 (eight) hours as needed for nausea or vomiting. (Patient not taking: Reported on 12/13/2022) 20 tablet 1   Ondansetron 4 MG FILM Take 4-8 mg by mouth every 6 (six) hours as needed. (Patient not taking: Reported on 12/13/2022) 15 each 0   Facility-Administered Medications Prior to Visit  Medication Dose Route Frequency Provider Last Rate Last Admin   famotidine (PEPCID) tablet 20 mg  20 mg Oral Once Acquanetta Chain, DO        Review of Systems  Constitutional:  Positive for malaise/fatigue. Negative for chills, fever and weight loss.  HENT:  Negative for hearing loss, sore throat and tinnitus.   Eyes:  Negative for blurred vision and double vision.  Respiratory:  Positive for shortness of breath. Negative for cough, hemoptysis, sputum production, wheezing and stridor.    Cardiovascular:  Negative for chest pain, palpitations, orthopnea, leg swelling and PND.  Gastrointestinal:  Negative for abdominal pain, constipation, diarrhea, heartburn, nausea and vomiting.  Genitourinary:  Negative for dysuria, hematuria and urgency.  Musculoskeletal:  Negative for joint pain and myalgias.  Skin:  Negative for itching and rash.  Neurological:  Negative for dizziness, tingling, weakness and headaches.  Endo/Heme/Allergies:  Negative for environmental allergies. Does not bruise/bleed easily.  Psychiatric/Behavioral:  Negative for depression. The patient is not nervous/anxious and does not have insomnia.   All other systems reviewed and are negative.    Objective:  Physical Exam Vitals reviewed.  Constitutional:      General: She is not in acute distress.    Appearance: She is well-developed.  HENT:     Head: Normocephalic and atraumatic.  Eyes:     General: No scleral icterus.    Conjunctiva/sclera: Conjunctivae normal.     Pupils: Pupils are equal, round, and reactive to light.  Neck:     Vascular: No JVD.     Trachea: No tracheal deviation.  Cardiovascular:     Rate and Rhythm: Normal rate and regular rhythm.     Heart sounds: Normal heart sounds. No murmur heard. Pulmonary:     Effort: Pulmonary effort is normal. No tachypnea, accessory muscle usage or respiratory distress.     Breath sounds: No stridor. No wheezing, rhonchi or rales.  Abdominal:     General: There is no distension.     Palpations: Abdomen is soft.     Tenderness: There is no abdominal tenderness.  Musculoskeletal:        General: No tenderness.     Cervical back: Neck supple.  Lymphadenopathy:     Cervical: No cervical adenopathy.  Skin:    General: Skin is warm and dry.     Capillary Refill: Capillary refill takes less than 2 seconds.     Findings: No  rash.  Neurological:     Mental Status: She is alert and oriented to person, place, and time.  Psychiatric:        Behavior:  Behavior normal.      Vitals:   12/13/22 1404  BP: 120/80  Pulse: 100  SpO2: 99%  Weight: 139 lb (63 kg)  Height: '5\' 6"'$  (1.676 m)   99% on RA BMI Readings from Last 3 Encounters:  12/13/22 22.44 kg/m  11/26/22 20.99 kg/m  11/26/22 20.98 kg/m   Wt Readings from Last 3 Encounters:  12/13/22 139 lb (63 kg)  11/26/22 130 lb 1.1 oz (59 kg)  11/26/22 130 lb (59 kg)     CBC    Component Value Date/Time   WBC 10.2 11/28/2022 0108   RBC 3.13 (L) 11/28/2022 0108   HGB 9.1 (L) 11/28/2022 0108   HGB 15.4 (H) 11/20/2022 0906   HGB 12.6 11/24/2020 1106   HCT 26.2 (L) 11/28/2022 0108   HCT 38.0 11/24/2020 1106   PLT 312 11/28/2022 0108   PLT 142 (L) 11/20/2022 0906   PLT 314 11/24/2020 1106   MCV 83.7 11/28/2022 0108   MCV 92 11/24/2020 1106   MCH 29.1 11/28/2022 0108   MCHC 34.7 11/28/2022 0108   RDW 13.9 11/28/2022 0108   RDW 11.8 11/24/2020 1106   LYMPHSABS 1.8 11/26/2022 1158   LYMPHSABS 2.7 11/24/2020 1106   MONOABS 1.3 (H) 11/26/2022 1158   EOSABS 0.4 11/26/2022 1158   EOSABS 0.2 11/24/2020 1106   BASOSABS 0.1 11/26/2022 1158   BASOSABS 0.0 11/24/2020 1106     Chest Imaging: Chest x-ray 12/13/2022: Improved aeration in the left lower lung.  Chest tube in place no significant effusion. The patient's images have been independently reviewed by me.    Pulmonary Functions Testing Results:     No data to display          FeNO:   Pathology:   Echocardiogram:   Heart Catheterization:     Assessment & Plan:     ICD-10-CM   1. Pleural effusion  J90 DG Chest 2 View    Ambulatory referral to Pulmonology    2. Malignant pleural effusion  J91.0     3. Adenocarcinoma of left lung, stage 4 (HCC)  C34.92       Discussion:  This is a 37 year old female with stage IV adenocarcinoma of the lung positive ERZ-ROS1 fusion.  Currently on targeted therapy.  She has responded well to this.  Her pleural effusion is nonexistent at this point.  She is draining  just a minimal amount of pleural fluid every couple of days.  Plan: We will plan to remove her Pleurx catheter. She is on anticoagulation due to arterial thrombi.  I do not think that we will be able to stop her anticoagulation for any period of time to be able to remove the Pleurx.  I did explain there may be a slight higher risk for the development of bleeding at the insertion site but we will do our best to control this with removal of the Pleurx catheter. Patient is agreeable and understands that there may be some added risk but we do not want to stop that in the setting of recent arterial thrombosis. We will tentatively schedule for removal coming up in March.  This will be completed as an outpatient procedure at [facility] endoscopy.    Current Outpatient Medications:    acetaminophen (TYLENOL) 325 MG tablet, Take 650 mg by mouth every 6 (  six) hours as needed for mild pain (for mild pain.)., Disp: , Rfl:    ALPRAZolam (XANAX) 0.5 MG tablet, Take 1 tablet (0.5 mg total) by mouth 3 (three) times daily as needed for anxiety. (Patient taking differently: Take 0.5 mg by mouth daily as needed for anxiety.), Disp: 60 tablet, Rfl: 0   aspirin EC 81 MG tablet, Take 1 tablet (81 mg total) by mouth daily. Swallow whole., Disp: 30 tablet, Rfl: 12   Cholecalciferol (VITAMIN D-3) 125 MCG (5000 UT) TABS, Take 1 tablet by mouth daily., Disp: , Rfl:    clopidogrel (PLAVIX) 75 MG tablet, Take 1 tablet (75 mg total) by mouth daily., Disp: 30 tablet, Rfl: 11   FeFum-FePoly-FA-B Cmp-C-Biot (INTEGRA PLUS) CAPS, Take 1 capsule by mouth every morning., Disp: 30 capsule, Rfl: 2   gabapentin (NEURONTIN) 100 MG capsule, Take 100 mg by mouth 2 (two) times daily.  Start 100 mg twice a day x 3 days, if tolerating, increase to 200 mg twice a day. Take 2 capsules (200 mg total) by mouth 2 (two) times daily, Disp: , Rfl:    levonorgestrel (MIRENA, 52 MG,) 20 MCG/DAY IUD, 1 each by Intrauterine route once., Disp: , Rfl:     metoCLOPramide (REGLAN) 5 MG tablet, Take 1 tablet (5 mg total) by mouth every 6 (six) hours as needed for nausea., Disp: 30 tablet, Rfl: 1   ondansetron (ZOFRAN) 8 MG tablet, Take 1 tablet (8 mg total) by mouth every 8 (eight) hours as needed for nausea or vomiting., Disp: 20 tablet, Rfl: 0   OVER THE COUNTER MEDICATION, Take 1,500 mg by mouth 2 (two) times daily. Beet root, Disp: , Rfl:    oxyCODONE (ROXICODONE) 5 MG immediate release tablet, Take 1 tablet (5 mg total) by mouth every 4 (four) hours as needed for severe pain. (Patient taking differently: Take 5 mg by mouth every 6 (six) hours as needed for severe pain.), Disp: 120 tablet, Rfl: 0   pantoprazole (PROTONIX) 40 MG tablet, Take 1 tablet (40 mg total) by mouth daily., Disp: 30 tablet, Rfl: 3   polyethylene glycol (MIRALAX / GLYCOLAX) 17 g packet, Take 17 g by mouth daily., Disp: , Rfl:    Repotrectinib 40 MG CAPS, Take 160 mg by mouth daily., Disp: 56 capsule, Rfl: 0   Rivaroxaban (XARELTO) 15 MG TABS tablet, Take 1 tablet (15 mg total) by mouth 2 (two) times daily with a meal., Disp: 42 tablet, Rfl: 0   [START ON 12/19/2022] rivaroxaban (XARELTO) 20 MG TABS tablet, Take 1 tablet (20 mg total) by mouth daily with supper., Disp: 30 tablet, Rfl: 3   Turmeric (QC TUMERIC COMPLEX PO), Take 500 mg by mouth daily., Disp: , Rfl:    zinc sulfate 220 (50 Zn) MG capsule, Take 220 mg by mouth daily., Disp: , Rfl:    benzonatate (TESSALON) 200 MG capsule, Take 1 capsule (200 mg total) by mouth 4 (four) times daily as needed for cough. (Patient not taking: Reported on 12/13/2022), Disp: 120 capsule, Rfl: 2   Magnesium Oxide -Mg Supplement 400 MG CAPS, Take 400 mg by mouth 2 (two) times daily. (Patient not taking: Reported on 11/27/2022), Disp: 60 capsule, Rfl: 0   ondansetron (ZOFRAN-ODT) 8 MG disintegrating tablet, Take 1 tablet (8 mg total) by mouth every 8 (eight) hours as needed for nausea or vomiting. (Patient not taking: Reported on 12/13/2022), Disp:  20 tablet, Rfl: 1   Ondansetron 4 MG FILM, Take 4-8 mg by mouth every 6 (six)  hours as needed. (Patient not taking: Reported on 12/13/2022), Disp: 15 each, Rfl: 0  Current Facility-Administered Medications:    famotidine (PEPCID) tablet 20 mg, 20 mg, Oral, Once, Acquanetta Chain, DO   Garner Nash, DO [facility] 12/13/2022 5:42 PM

## 2022-12-16 ENCOUNTER — Inpatient Hospital Stay: Payer: BC Managed Care – PPO | Admitting: Licensed Clinical Social Worker

## 2022-12-16 DIAGNOSIS — C3492 Malignant neoplasm of unspecified part of left bronchus or lung: Secondary | ICD-10-CM | POA: Diagnosis not present

## 2022-12-16 DIAGNOSIS — Z79899 Other long term (current) drug therapy: Secondary | ICD-10-CM | POA: Diagnosis not present

## 2022-12-16 DIAGNOSIS — C3491 Malignant neoplasm of unspecified part of right bronchus or lung: Secondary | ICD-10-CM | POA: Diagnosis not present

## 2022-12-16 DIAGNOSIS — Z7901 Long term (current) use of anticoagulants: Secondary | ICD-10-CM | POA: Diagnosis not present

## 2022-12-16 DIAGNOSIS — D75839 Thrombocytosis, unspecified: Secondary | ICD-10-CM | POA: Diagnosis not present

## 2022-12-16 NOTE — Progress Notes (Signed)
[place] Work  Initial Assessment   Melissa Pineda is a 37 y.o. year old female contacted by phone. Clinical Social Work was referred by medical provider for assessment of psychosocial needs.   SDOH (Social Determinants of Health) assessments performed: Yes   SDOH Screenings   Food Insecurity: No Food Insecurity (11/26/2022)  Housing: Low Risk  (11/26/2022)  Transportation Needs: No Transportation Needs (11/26/2022)  Utilities: Not At Risk (11/26/2022)  Depression (PHQ2-9): Low Risk  (11/24/2020)  Tobacco Use: Low Risk  (12/13/2022)     Distress Screen completed: No     No data to display            Family/Social Information:  Housing Arrangement: patient lives with her husband, Lavell Islam and their 31 month old son. Family members/support persons in your life? Pt's in-laws live locally and have been very supportive.  Pt's family resides in [place], but pt's mother flew in when pt was diagnosed and has stayed for a couple of weeks.  Pt's mother will be flying back and forth from [place] to support pt as much as possible.  The couple also has a nanny that assists with the baby as pt has been extremely ill and has only recently started to recover physically. Transportation concerns: no  Employment: Working full time.  Pt is employed as a Conservation officer, historic buildings w/ an Social worker.  Pt has already used up 4 weeks of FMLA which was the only time she could receive pay.  Pt's FMLA will extend until April 15th, but she will not be receiving a paycheck for the duration of the time she is out of work.  Pt's husband works in Chiropractor and has continue to work full time.   Income source: Employment Financial concerns: Yes, due to illness and/or loss of work during treatment Type of concern: Utilities, Film/video editor, and Medical bills Food access concerns: no Religious or spiritual practice: Not known Services Currently in place:  none  Coping/  Adjustment to diagnosis: Patient understands treatment plan and what happens next? yes Concerns about diagnosis and/or treatment: How I will care for other members of my family, Losing my job and/or losing income, Overwhelmed by information, Afraid of cancer, How will I care for myself, and Quality of life Patient reported stressors: Insurance underwriter, Work/ school, Publishing rights manager, Children, Anxiety/ nervousness, and Adjusting to my illness Hopes and/or priorities: Pt's priority is to continue with treatment w/ the hope of positive results. Patient enjoys time with family/ friends Current coping skills/ strengths: Ability for insight , Average or above average intelligence , Capable of independent living , Communication skills , General fund of knowledge , Motivation for treatment/growth , Physical Health , and Supportive family/friends     SUMMARY: Current SDOH Barriers:  Financial constraints related to loss of income  Clinical Social Work Clinical Goal(s):  Explore community resource options for unmet needs related to:  Financial Strain   Interventions: Discussed common feeling and emotions when being diagnosed with cancer, and the importance of support during treatment Informed patient of the support team roles and support services at [facility] Provided [place] contact information and encouraged patient to call with any questions or concerns Referred patient to financial resource specialist to apply for the [employer].  Informed pt about the [facility] which can be applied for if needed.  Discussed supportive services.  Pt has an appointment w/ Dr. Michail Sermon for individual counseling.  Spoke w/ pt about the [facility]  with [facility] which will start on March 21st.  Pt would like to be added to the list and would like to think about it.  CSW to call pt on Tuesday before the group starts to see if she would like to attend.     Follow Up Plan: Patient will contact CSW with any support or  resource needs Patient verbalizes understanding of plan: Yes    Henriette Combs, LCSW

## 2022-12-16 NOTE — Telephone Encounter (Signed)
Oral Chemotherapy Pharmacist Encounter   Received phone call from patient requesting update on status of [employer] Patient Assistance Appeal process. Patient informed that the documentation from [employer] of [employer] not being on her formulary had been refaxed to BMS this AM to two separate fax lines after BMS stated they had not received a fax from our office. Additionally I updated patient that our BMS reimbursement manager had escalated the patient's case effective as of 12/13/22 and someone from BMS should be reaching out to our office to discuss that breakdown in her case.   Patient appreciative of the above, but still worried about running out of her free trial offer and not having additional medication on hand. I have reached out to our BMS representative inquiring about starting the bridge program at this time.  Oral chemotherapy clinic will continue to follow.   Leron Croak, PharmD, BCPS, BCOP Hematology/Oncology Clinical Pharmacist [facility] [place] [phone] 12/16/2022 2:25 PM

## 2022-12-17 ENCOUNTER — Encounter (HOSPITAL_COMMUNITY): Payer: Self-pay | Admitting: Pulmonary Disease

## 2022-12-17 ENCOUNTER — Ambulatory Visit (HOSPITAL_COMMUNITY)
Admission: RE | Admit: 2022-12-17 | Discharge: 2022-12-17 | Disposition: A | Discharge status: Home / Self Care | Payer: BC Managed Care – PPO | Source: Ambulatory Visit | Attending: Pulmonary Disease | Admitting: Pulmonary Disease

## 2022-12-17 ENCOUNTER — Encounter (HOSPITAL_COMMUNITY)
Admission: RE | Disposition: A | Discharge status: Home / Self Care | Payer: Self-pay | Source: Ambulatory Visit | Attending: Pulmonary Disease

## 2022-12-17 DIAGNOSIS — Z86718 Personal history of other venous thrombosis and embolism: Secondary | ICD-10-CM | POA: Diagnosis not present

## 2022-12-17 DIAGNOSIS — Z79899 Other long term (current) drug therapy: Secondary | ICD-10-CM | POA: Diagnosis not present

## 2022-12-17 DIAGNOSIS — C3490 Malignant neoplasm of unspecified part of unspecified bronchus or lung: Secondary | ICD-10-CM | POA: Diagnosis not present

## 2022-12-17 DIAGNOSIS — Z7901 Long term (current) use of anticoagulants: Secondary | ICD-10-CM | POA: Insufficient documentation

## 2022-12-17 DIAGNOSIS — Z7902 Long term (current) use of antithrombotics/antiplatelets: Secondary | ICD-10-CM | POA: Diagnosis not present

## 2022-12-17 DIAGNOSIS — Z4682 Encounter for fitting and adjustment of non-vascular catheter: Secondary | ICD-10-CM | POA: Insufficient documentation

## 2022-12-17 DIAGNOSIS — J910 Malignant pleural effusion: Secondary | ICD-10-CM | POA: Diagnosis present

## 2022-12-17 DIAGNOSIS — C3492 Malignant neoplasm of unspecified part of left bronchus or lung: Secondary | ICD-10-CM | POA: Diagnosis present

## 2022-12-17 HISTORY — PX: REMOVAL OF PLEURAL DRAINAGE CATHETER: SHX5080

## 2022-12-17 SURGERY — MINOR REMOVAL OF PLEURAL DRAINAGE CATHETER
Anesthesia: LOCAL

## 2022-12-17 MED ORDER — LIDOCAINE HCL (PF) 1 % IJ SOLN
INTRAMUSCULAR | Status: AC
  Filled 2022-12-17: qty 30

## 2022-12-17 NOTE — Interval H&P Note (Signed)
History and Physical Interval Note:  12/17/2022 2:51 PM  Melissa Pineda  has presented today for surgery, with the diagnosis of pleurx removal.  The various methods of treatment have been discussed with the patient and family. After consideration of risks, benefits and other options for treatment, the patient has consented to  Procedure(s): MINOR REMOVAL OF PLEURAL DRAINAGE CATHETER (N/A) as a surgical intervention.  The patient's history has been reviewed, patient examined, no change in status, stable for surgery.  I have reviewed the patient's chart and labs.  Questions were answered to the patient's satisfaction.    Antiplatelets and xarelto not held due to risk of arterial thrombus. There is a higher risk of bleeding but I have discussed this with the patient and vascular surgery.   [place]

## 2022-12-17 NOTE — Telephone Encounter (Signed)
Oral Oncology Patient Advocate Encounter  Called [employer] [phone] -to confirm receipt of appeal documentation. Documents have been received and process has begun. Current status is pending review.  I requested information regarding patient's eligibility for the bridge program while the appeal is processing and was re-directed to [place]  I spoke with Orion Crook at [place] and she informed me that she was unsure if the patient qualified for the bridge program as it is usually reserved for patients that are pending a prior authorization for greater than 5 days. She took the patient's case information and informed me that she would be reaching out to her management team for review to see if they are able to provide bridge therapy to patients who are currently pending a determination from [employer]. I also informed her that the patient was given a free-trial program, but the medication received from this will run out in a few days. Orion Crook stated she would call me back once she heard back from her management team.  [name], CPhT-Adv Oncology Pharmacy Patient [place] Direct Number: [phone]  Fax: [phone]

## 2022-12-18 ENCOUNTER — Other Ambulatory Visit (HOSPITAL_COMMUNITY): Payer: Self-pay

## 2022-12-18 ENCOUNTER — Encounter (HOSPITAL_COMMUNITY): Payer: Self-pay | Admitting: Pulmonary Disease

## 2022-12-18 ENCOUNTER — Ambulatory Visit (INDEPENDENT_AMBULATORY_CARE_PROVIDER_SITE_OTHER): Payer: BC Managed Care – PPO | Admitting: Behavioral Health

## 2022-12-18 DIAGNOSIS — Z9181 History of falling: Secondary | ICD-10-CM | POA: Diagnosis not present

## 2022-12-18 DIAGNOSIS — D630 Anemia in neoplastic disease: Secondary | ICD-10-CM | POA: Diagnosis not present

## 2022-12-18 DIAGNOSIS — F4323 Adjustment disorder with mixed anxiety and depressed mood: Secondary | ICD-10-CM | POA: Diagnosis not present

## 2022-12-18 DIAGNOSIS — Z7951 Long term (current) use of inhaled steroids: Secondary | ICD-10-CM | POA: Diagnosis not present

## 2022-12-18 DIAGNOSIS — Z48813 Encounter for surgical aftercare following surgery on the respiratory system: Secondary | ICD-10-CM | POA: Diagnosis not present

## 2022-12-18 DIAGNOSIS — C3492 Malignant neoplasm of unspecified part of left bronchus or lung: Secondary | ICD-10-CM | POA: Diagnosis not present

## 2022-12-18 DIAGNOSIS — Z4801 Encounter for change or removal of surgical wound dressing: Secondary | ICD-10-CM | POA: Diagnosis not present

## 2022-12-18 DIAGNOSIS — J910 Malignant pleural effusion: Secondary | ICD-10-CM | POA: Diagnosis not present

## 2022-12-18 DIAGNOSIS — D72829 Elevated white blood cell count, unspecified: Secondary | ICD-10-CM | POA: Diagnosis not present

## 2022-12-18 DIAGNOSIS — F419 Anxiety disorder, unspecified: Secondary | ICD-10-CM | POA: Diagnosis not present

## 2022-12-18 DIAGNOSIS — Z8701 Personal history of pneumonia (recurrent): Secondary | ICD-10-CM | POA: Diagnosis not present

## 2022-12-18 NOTE — Telephone Encounter (Signed)
Oral Oncology Patient Advocate Encounter  Called and spoke with Arlisha to update her on the current status of her Augtyro assistance.  I went over the process of patient assistance with BMS and the way that it goes first to BMS Access Support and then is sent over to [facility] for final review. BMS Access Support handles free trials and bridge program applications as well as the initial benefits investigation. BMSPAF receives the information BMS Access Support uncovers and then conducts a final review based on their specific parameters to determine if a patient qualifies for free drug.   I also explained that the denial BMSPAF provided was due to the patient's insurance flagging as an AFP (Alternate Funding Plan). I further explained that this term means an insurance plan is requiring the beneficiary to pursue outside resources such as manufacturer assistance in place of the insurance paying for the medication. This denial is being appealed because the patient's insurance plan does not fall into this category. This appeal is taking place with the BMSPAF side exclusively.   Per standard bridge program requirements, a patient is only eligible if the medication is pending a PA for greater than 5 days. Since Eriona's case falls outside of this parameter the case had to be escalated through management to request an exception based on her situation.   I advised the patient that I would be continuing to follow this case closely as I am aware that she only has about a week of medication remaining from her initial free trial as of today, 12/18/22. I will continue to follow up with BMS Access Support, BMSPAF, and the patient until a final outcome is achieved.  [name], CPhT-Adv Oncology Pharmacy Patient [place] Direct Number: [phone]  Fax: [phone]

## 2022-12-18 NOTE — Telephone Encounter (Signed)
Oral Chemotherapy Pharmacist Encounter   Spoke with our BMS reimbursement manager regarding patient's case. Informed Marzetta Board that we still had not received phone call from [facility] to discuss the the patient's appeal for inclusion into the patient assistance program.   Per Marzetta Board, she is actively working with BMS to work with [place] and [employer] for [employer] since it is not pulling up on patient's prescription drug formulary.   Our patient advocate, Lyndee Leo, is also attempting to try and get patient bridge coverage in the meantime. Lyndee Leo is awaiting call back from [place] regarding BMS making an exception to patient's case as the Health Net is traditionally used when appealing for coverage of medication through [employer], but we are unable to currently appeal in the traditional manner as medication is not pulling up for prior authorization on CoverMyMeds or even by phone prior authorization with [employer].  Oral chemotherapy clinic will continue to follow.   Leron Croak, PharmD, BCPS, BCOP Hematology/Oncology Clinical Pharmacist [facility] and [place] [phone] 12/18/2022 9:11 AM

## 2022-12-18 NOTE — Progress Notes (Signed)
[place] Counselor Initial Adult Exam  Name: Melissa Pineda Date: 12/18/2022 MRN: [id number] DOB: Apr 25, 1986 PCP: Patient, No Pcp Per  Time spent: 60 minutes  Guardian/Payee:  self    Paperwork requested: No   Reason for Visit /Presenting Problem: anxiety, medical concerns  Melissa Pineda is a 37 year old married female with 60 year old son.  Her biological family primarily lives in [place], specifically her parents.  Her husband's family lives in this area.  She reports good relationships with her husband and his family as well as her family.  She and her husband waited until April but will try to have a child and love their son deeply.  He just turned 1 recently.  Is November 2023 the patient started feeling well.  In early December she did go to an urgent care where she was given some medication which seemed to bring some relief but a significant cough persisted.  She did not feel well during Christmas season and said that she had somewhat of a breakdown because she just wanted to feel better and year that she was going to go to [place] to see her family.  While in [place] her parents encouraged her to go to an urgent care.  They do to have a chest x-ray gave her some medication and indicated that they did not see any issues with her lungs.  I prescribed medications which were not helpful.  She and her son as well as her mother flew back to [place] in early January.  She knew that she had her son's birthday on January 10 with his birthday party being January 13 to prepare for him.  She continues to feel quite down and overwhelmed and went to the emergency department January 10.  They did another chest x-ray with a tendency something that initially appeared to think it was pneumonia.  They gave her medicine to treat that on January 15 she reports feeling more of a sense of urgency as she was not feeling better.  Her son had started her badly.  Her mother wanted to stay but  patient encouraged her to go.  After her mother went home, seeing no improvement, her mother in law took her to the emergency room.  Her husbands aunt is an emergency doctor and they contacted her.  Doctors there believed that it was pleural effusion, fluid between the lungs and chest wall.  They did a lung tap to test the best fluid and removed liter of fluid which was discolored.  They were testing for various viruses.  A pulmonologist came to follow up.  The patient's mother came back to [place].  Around that time the patient's husband received a call to come to the hospital to review treatment options.  The patient called her mother-in-law and found out that they had discovered lung cancer.  They wanted her family to be there when they told her.  Oncologist told her that it was stage IV lung cancer and the patient had no history of smoking other than marijuana.  The patient said that she was completely shocked at the news but all she could think of was her husband, son, family and not herself..  They also found some lymph nodes in her chest and a spot on her skull.  They told her it was not a death sentence and that there were treatment options.  She remembers continuing to apologize to family.  She was released from the hospital on January 19  but went back for additional tests.  Tissue was sent for genetic testing to see what type of cancer it was so it could best be treated.  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  It is described as very aggressive.  It had been developing over 6 or 7 months and developed with a she correcting itself.  The patient was told that there was a new medication which they felt could be beneficial but she was sent to [facility] to a thoracic oncologist for a second opinion.  The patient said that she was extremely sick and exhausted the day could barely make it to the appointment.  At that time she  started noticing that her leg was falling asleep.  After returning home she received a call from [facility] saying there was questions about the positive pregnancy which the patient knew was not realistic.  She still had to go to maternity assessment for blood draws and ultrasounds which showed no pregnancy.  During this time they are still getting 800 mL to 1 L/day of fluid.  The patient was initially denied by insurance for the medication and her oncologist petition [facility] for a 40 free trial of the medication and she did get a free 30-day trial approved.  Before that medication could be started she was feeling so badly that they started a number of medication which started to bring almost immediate relief.  In early February she was able to eat some breakfast and get up and move around a lot.  The numbness in her leg continued and it started falling asleep.  Ultrasound was ordered and they found 3 large blood clots in her left leg as well as one in her right leg and a femoral blood clot.  She was immediately sent to the hospital where she met with a vascular surgeon.  He felt it was critical and was hopeful to be able to remove the clots.  She had a very difficult experience in the hospital the night prior to surgery.  He was able to remove the main blood clot but there was concern initially that she could lose her leg or foot.  They were able to start blood thinners and effectively treat this. The patient is wrestling with multiple emotions/feelings because all that she medically gets physically was very traumatic for her and that is contrasted with the fact that she feels good right now.  The clinical trial of morning medication she results for 3 years but they are not sure what looks like beyond that.  The patient is wrestling with trying to medical with what has happened and what is going on with treatment.  She is questioning how she continues and/or plans and comes to terms with all of the uncertainties  especially regarding her family.  She reports that she has always been more who has planned well and feels better when she can control things and there is so little about things that she can control. Mental Status Exam: Appearance:   Well Groomed     Behavior:  Appropriate  Motor:  Normal  Speech/Language:   Clear and Coherent  Affect:  Appropriate  Mood:  anxious  Thought process:  normal  Thought content:    WNL  Sensory/Perceptual disturbances:    WNL  Orientation:  oriented to person, place, time/date, situation, day of week, month of year, and year  Attention:  Good  Concentration:  Good  Memory:  WNL  Fund of knowledge:   Good  Insight:    Good  Judgment:   Good  Impulse Control:  Good    Reported Symptoms:  anxiety, feeling overwhelmed  Risk Assessment: Danger to Self:  No Self-injurious Behavior: No Danger to Others: No Duty to Warn:no Physical Aggression / Violence:No  Access to Firearms a concern: No  Gang Involvement:No  Patient / guardian was educated about steps to take if suicide or homicide risk level increases between visits: n/a While future psychiatric events cannot be accurately predicted, the patient does not currently require acute inpatient psychiatric care and does not currently meet [facility] involuntary commitment criteria.  Substance Abuse History: Current substance abuse: No     Past Psychiatric History:   No previous psychological problems have been observed Outpatient Providers: History of Psych Hospitalization: No  Psychological Testing:  n/a    Abuse History:  Victim of: No.,  none reported    Report needed: No. Victim of Neglect:No. Perpetrator of  n/a   Witness / Exposure to Domestic Violence:  none reported   Protective Services Involvement: No  Witness to [employer] Violence:   none reported  Family History:  Family History  Problem Relation Age of Onset   Cancer Mother    Cancer Maternal Grandmother    Cancer Maternal  Grandfather    Hypertension Paternal Grandmother    Cancer Paternal Grandfather     Living situation: the patient lives with their family  Sexual Orientation: Straight  Relationship Status: married  Name of spouse / other:not known If a parent, number of children / ages:1 y.o. son  Support Systems: spouse friends parents  Museum/gallery curator Stress:  No   Income/Employment/Disability: Employment  Armed forces logistics/support/administrative officer: No   Educational History: Education: Scientist, product/process development: Not discussed  Any cultural differences that may affect / interfere with treatment:  not applicable   Recreation/Hobbies: time with family  Stressors: Health problems    Strengths: Supportive Relationships, Family, Friends, Hopefulness, Conservator, museum/gallery, and Able to Communicate Effectively   Legal History:None reported Pending legal issue / charges: The patient has no significant history of legal issues. History of legal issue / charges:  none reported  Medical History/Surgical History: reviewed Past Medical History:  Diagnosis Date   Anxiety    [employer] 11/21/2020   Cancer [facility])     Past Surgical History:  Procedure Laterality Date   ABDOMINAL AORTOGRAM W/LOWER EXTREMITY N/A 11/27/2022   Procedure: ABDOMINAL AORTOGRAM W/LOWER EXTREMITY;  Surgeon: Cherre Robins, MD;  Location: [place] [facility];  Service: Cardiovascular;  Laterality: N/A;   IR PERC PLEURAL DRAIN W/INDWELL CATH W/IMG GUIDE  11/09/2022   PERIPHERAL VASCULAR ATHERECTOMY  11/27/2022   Procedure: PERIPHERAL VASCULAR ATHERECTOMY;  Surgeon: Cherre Robins, MD;  Location: [place] [facility];  Service: Cardiovascular;;   PERIPHERAL VASCULAR THROMBECTOMY  11/27/2022   Procedure: PERIPHERAL VASCULAR THROMBECTOMY;  Surgeon: Cherre Robins, MD;  Location: [place] [facility];  Service: Cardiovascular;;   REMOVAL OF PLEURAL DRAINAGE CATHETER N/A 12/17/2022   Procedure: MINOR REMOVAL OF PLEURAL DRAINAGE CATHETER;  Surgeon:  Garner Nash, DO;  Location: [place];  Service: Cardiopulmonary;  Laterality: N/A;   WISDOM TOOTH EXTRACTION      Medications: Current Outpatient Medications  Medication Sig Dispense Refill   acetaminophen (TYLENOL) 325 MG tablet Take 650 mg by mouth every 6 (six) hours as needed for mild pain (for mild pain.).  ALPRAZolam (XANAX) 0.5 MG tablet Take 1 tablet (0.5 mg total) by mouth 3 (three) times daily as needed for anxiety. (Patient taking differently: Take 0.5 mg by mouth 2 (two) times daily as needed for anxiety.) 60 tablet 0   aspirin EC 81 MG tablet Take 1 tablet (81 mg total) by mouth daily. Swallow whole. 30 tablet 12   benzonatate (TESSALON) 200 MG capsule Take 1 capsule (200 mg total) by mouth 4 (four) times daily as needed for cough. (Patient not taking: Reported on 12/13/2022) 120 capsule 2   Cholecalciferol (VITAMIN D-3) 125 MCG (5000 UT) TABS Take 1 tablet by mouth daily.     clopidogrel (PLAVIX) 75 MG tablet Take 1 tablet (75 mg total) by mouth daily. 30 tablet 11   FeFum-FePoly-FA-B Cmp-C-Biot (INTEGRA PLUS) CAPS Take 1 capsule by mouth every morning. (Patient not taking: Reported on 12/16/2022) 30 capsule 2   Ferrous Sulfate (IRON HIGH-POTENCY) 142 (45 Fe) MG TBCR Take 142 mg by mouth daily.     gabapentin (NEURONTIN) 100 MG capsule Take 200 mg by mouth 2 (two) times daily.     levonorgestrel (MIRENA, 52 MG,) 20 MCG/DAY IUD 1 each by Intrauterine route once.     Magnesium Oxide -Mg Supplement 400 MG CAPS Take 400 mg by mouth 2 (two) times daily. 60 capsule 0   metoCLOPramide (REGLAN) 5 MG tablet Take 1 tablet (5 mg total) by mouth every 6 (six) hours as needed for nausea. 30 tablet 1   ondansetron (ZOFRAN) 8 MG tablet Take 1 tablet (8 mg total) by mouth every 8 (eight) hours as needed for nausea or vomiting. 20 tablet 0   ondansetron (ZOFRAN-ODT) 8 MG disintegrating tablet Take 1 tablet (8 mg total) by mouth every 8 (eight) hours as needed for nausea or vomiting. 20  tablet 1   Ondansetron 4 MG FILM Take 4-8 mg by mouth every 6 (six) hours as needed. 15 each 0   OVER THE COUNTER MEDICATION Take 2 tablets by mouth daily. Beet root     oxyCODONE (ROXICODONE) 5 MG immediate release tablet Take 1 tablet (5 mg total) by mouth every 4 (four) hours as needed for severe pain. (Patient taking differently: Take 5 mg by mouth every 6 (six) hours as needed for severe pain.) 120 tablet 0   pantoprazole (PROTONIX) 40 MG tablet Take 1 tablet (40 mg total) by mouth daily. 30 tablet 3   polyethylene glycol (MIRALAX / GLYCOLAX) 17 g packet Take 17 g by mouth daily.     Repotrectinib 40 MG CAPS Take 160 mg by mouth daily. (Patient taking differently: Take 160 mg by mouth 2 (two) times daily.) 56 capsule 0   Rivaroxaban (XARELTO) 15 MG TABS tablet Take 1 tablet (15 mg total) by mouth 2 (two) times daily with a meal. 42 tablet 0   [START ON 12/19/2022] rivaroxaban (XARELTO) 20 MG TABS tablet Take 1 tablet (20 mg total) by mouth daily with supper. 30 tablet 3   Turmeric (QC TUMERIC COMPLEX PO) Take 500 mg by mouth daily.     zinc sulfate 220 (50 Zn) MG capsule Take 220 mg by mouth daily.     Current Facility-Administered Medications  Medication Dose Route Frequency Provider Last Rate Last Admin   famotidine (PEPCID) tablet 20 mg  20 mg Oral Once Lane Hacker L, DO        Allergies  Allergen Reactions   Hydrocodone Nausea Only    Dizzy, "crawl out of my skin"   Tramadol  Other (See Comments)    "Weird feeling"    Diagnoses:  Adjustment disorder with mixed anxiety and depressed mood/anxiety due to a medical condition  Plan of Care: F/U appointments scheduled.  We will meet weekly in person.   Sabas Sous, [facility]

## 2022-12-18 NOTE — Progress Notes (Addendum)
Patient ID: Melissa Pineda, MRN: [id number],     Date: 12/23/2022   Time Spent: 60 minutes In Person visit    Treatment Type: Individual Therapy   Reported Symptoms: anxiety/stress This session was conducted in person.  Since our last session, the patient has been in between her insurance company [employer] and [place] trying to resolve the new medication availability.  Both oncologist and their staff's at [facility] and [place] have been interacting on her part.  The patient got a 30-day trial initially and that is about up.  After multiple conversations she has been approved for 2 more months but is not sure what will happen before then.  If express [employer] refuses that she goes back to [employer] for approval.  It is a difficult thing because it would be outrageous price wise out of pocket when she falls in the crack of not being wealthy but not being below minimal income levels.  She also was wrestling with questions of how long this medication will last.  Stage IV lung cancer of this type, ROS 1 is treatable but not curable.  It is a fairly new medication and she feels good now other than some morning dizziness but has the what if questions of how long it might last.  She has been on FMLA since the middle of January when she went into the hospital and that technically runs out on April 15 but she knows she cannot go back to work until she has new scans on March 22.  She also will have a cardiac ultrasound.  When they found the blood clots in her legs 1 scan showed something in the heart and the cardiologist was not sure if it was a clot or possible cancer.  Even hearing the cardiologist say that was a shock because at that point she did not realize it cancer and her heart was a possibility.  It is in the lymph nodes in her chest, on her skull,and obviously in her lungs.  They are treating both the blood clots and the cancer so no matter which it is she knows that is  being treated.  This is all called targeted therapy and the medication she is on is the first line treatment.  The medication is called Octari sp?  [facility] purchased that medication.   The patient says she wrestles with perfectionism and always having to be the best and it is something that has always been a part of who she is.  That does not matter if it is something around the house or otherwise she wants to do the best and not being able to do much about this diagnosis and is very difficult for her.  She recognizes that she needs to be more relaxed and is asking herself what normal life looks like now.  She recognizes because of the diagnosis there will be things she cannot do that she has done in the past.  Acknowledged and validated the fact that she is projecting.   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  Her manager has been very good to her and she reports a good relationship with him but she is looking at the what if's of work as to when and how long until she can etc.  She knows now that mentally and emotionally she is  not prepared to handle the stress that comes along with her job.  Her husband is working but does not make the money she does and even if she went part-time there would be significant financial stress.   She does have good supports for her son having a nanny Monday through Thursday and her mother-in-law keeps her shot on Friday.  Her mother is coming back and forth from [place] as often as she can.   The patient does some relaxation breathing and I emphasized the importance of that.  Also introduced a mindfulness exercise involving the Beatles song twist and shout as a way of keeping her thoughts in the moment and being mindful in her current situation.  We talked about the "process of acceptance."   Mental Status Exam: Appearance:   Well Groomed     Behavior: Appropriate  Motor: Normal  Speech/Language:  Clear and Coherent  Affect: anxious  Mood: anxious  Thought process: normal  Thought content:   WNL  Sensory/Perceptual disturbances:   WNL  Orientation: oriented to person, place, time/date, situation, day of week, month of year, and year  Attention: Good  Concentration: Good  Memory: WNL  Fund of knowledge:  Good  Insight:   Good  Judgment:  Good  Impulse Control: Good    Risk Assessment: Danger to Self:  No Self-injurious Behavior: No Danger to Others: No Duty to Warn:no Physical Aggression / Violence:No  Access to Firearms a concern: No  Gang Involvement:No    Treatment plan: To use cognitive behavioral therapy principles as well as elements of dialectical behavior therapy specifically mindfulness and relaxation techniques.  We will also employ elements of acceptance and commitment therapy.  Goals are to improve the patient's ability to manage anxiety symptoms and the unpredictability of her diagnosis.  We will use mindfulness to help her manage thoughts and worrisome thinking contributing to anxiety through CBT, DBT distress tolerance and mindfulness skills.  We will use CBT and DBT to help minimize depression so as to improve mood and return to a healthier level of functioning.  We will encourage shearing of feelings related to her diagnosis and what life looks like moving forward.   Interventions: Cognitive Behavioral Therapy and Mindfulness Meditation Treatment plan: We will employ principles of cognitive behavioral therapy as well as elements of dialectical behavior therapy including mindfulness and coping skills in addition to person centered therapy.  Goals include help the patient process feelings and emotions related to cancer diagnosis by helping her have improved mood and return to a healthier level of functioning through the use of cognitive behavioral therapy and thought replacement, encouraged  sharing of feelings related to her diagnosis and teach and encouraged the consistent use of coping skills for management of depressive symptoms with a goal of depression reduction of 50% over the next 6 months.  The goal will be to reduce anxiety at least 25% over the next 3 to 6 months through teaching coping skills to better manage anxiety and stress related to her diagnosis, use dialectical behavior therapy to help her manage thoughts and worrisome thinking contributing to feelings of anxiety and fear and to teach dialectical behavior therapy distress tolerance and mindfulness skills. Diagnosis: Adjustment disorder with mixed anxiety and depressed mood   Plan: F/u appointments scheduled   Sabas Sous, [facility]

## 2022-12-18 NOTE — Telephone Encounter (Signed)
Oral Oncology Patient Advocate Encounter  Received a call from [facility] requesting further explanation of patient's insurance situation. I confirmed with the representative that the initial benefits investigation they ran was for her prime therapeutics plan, which is no longer the correct processor for her pharmacy benefit.   I provided the updated pharmacy plan information from [place] as they are the current processor. The foundation representative informed me that she would be conducting a new benefits investigation based on the updated information. She expects this to be completed by tomorrow, 12/19/22, and I will follow up at that time if I have not heard back from her before then.   Melissa Pineda, CPhT-Adv Oncology Pharmacy Patient [place] Direct Number: [phone]  Fax: [phone]

## 2022-12-18 NOTE — Telephone Encounter (Signed)
Oral Oncology Patient Advocate Encounter  Called [employer] to follow up on the status of the patient's eligibility for the bridge program.   I spoke with Constance Holster who informed me that the process was still ongoing. Constance Holster stated that the request may have been escalated further up in management due to the nature of the exception that we are requesting for this patient's case.  I will continue to follow up until final determination.  [name], CPhT-Adv Oncology Pharmacy Patient [place] Direct Number: [phone]  Fax: [phone]

## 2022-12-19 ENCOUNTER — Other Ambulatory Visit (HOSPITAL_COMMUNITY): Payer: Self-pay

## 2022-12-19 ENCOUNTER — Encounter: Payer: Self-pay | Admitting: Behavioral Health

## 2022-12-19 ENCOUNTER — Telehealth: Payer: Self-pay | Admitting: *Deleted

## 2022-12-19 NOTE — Telephone Encounter (Deleted)
.  virt

## 2022-12-19 NOTE — Telephone Encounter (Signed)
PC received from patient, she is asking about her Augtyro, her last dose is next week & she is asking what is the plan if she can't get coverage for this medication.  She would like to talk about "plan B."  Note forwarded to physician.

## 2022-12-20 ENCOUNTER — Other Ambulatory Visit: Payer: Self-pay

## 2022-12-20 NOTE — Telephone Encounter (Signed)
Oral Oncology Patient Advocate Encounter  Called [employer] and spoke with Janett Billow. I informed them that we are unable to proceed with [employer] at this current time and that we are requesting additional support from their side in the interim.  Currently, the status is pending.  Melissa Pineda, CPhT-Adv Oncology Pharmacy Patient [place] Direct Number: [phone]  Fax: [phone]

## 2022-12-20 NOTE — Telephone Encounter (Signed)
Oral Oncology Pharmacist Encounter   Called [employer] at [phone] in an additional attempt to get prior authorization for patient's Augtyro. Initial representative stated that patient's insurance group number doesn't cover the drug, but she was able to transfer me to [facility] for benefit coverage review.  After providing John with patient's ICD-10, he was able to obtain an actual denial for product that stated the patient's plan benefits do not provide coverage of this product, but the office could appeal this decision with a letter of medical necessity.  Urgent letter of medical necessity faxed to [employer] at [phone].  Patient's case ID is [id number]    Oral Oncology Clinic will continue to follow. Patient is aware that letter of medical necessity has been able to be filed through her insurance.    Leron Croak, PharmD, BCPS, BCOP Hematology/Oncology Clinical Pharmacist [facility] [place] [phone] 12/20/2022 12:20 PM

## 2022-12-23 ENCOUNTER — Encounter: Payer: Self-pay | Admitting: Behavioral Health

## 2022-12-23 ENCOUNTER — Ambulatory Visit (INDEPENDENT_AMBULATORY_CARE_PROVIDER_SITE_OTHER): Payer: BC Managed Care – PPO | Admitting: Behavioral Health

## 2022-12-23 DIAGNOSIS — F4323 Adjustment disorder with mixed anxiety and depressed mood: Secondary | ICD-10-CM

## 2022-12-23 NOTE — Telephone Encounter (Signed)
Oral Oncology Patient Advocate Encounter  Received notification from [facility] Access Support that patient was successfully pulled back to their system. Patient has been approved for the bridge program to receive Augtyro for 2 months at no charge.  [employer] will reach out to me once they have tracking information for the shipment.  I have spoken with the patient and provided them this information.  The case will not officially be closed until a final resolution has been made with insurance, [employer], regarding the formulary status of Augtyro. Once a final determination is made at insurance, the case will be revisited and completed accordingly.  [name], CPhT-Adv Oncology Pharmacy Patient [place] Direct Number: [phone]  Fax: [phone]

## 2022-12-23 NOTE — Progress Notes (Signed)
[facility] Counselor/Therapist Progress Note  Patient ID: Zakeria Kartchner, MRN: [id number],    Date: 12/23/2022  Time Spent: 60 minutes In Person visit   Treatment Type: Individual Therapy  Reported Symptoms: anxiety/stress This session was conducted in person.  Since our last session, the patient has been in between her insurance company [employer] and [place] trying to resolve the new medication availability.  Both oncologist and their staff's at [facility] [place] have been interacting on her part.  The patient got a 30-day trial initially and that is about up.  After multiple conversations she has been approved for 2 more months but is not sure what will happen before then.  If [employer] refuses that she goes back to [employer] for approval.  It is a difficult thing because it would be outrageous price wise out of pocket when she falls in the crack of not being wealthy but not being below minimal income levels.  She also was wrestling with questions of how long this medication will last.  Stage IV lung cancer of this type, ROS 1 is treatable but not curable.  It is a fairly new medication and she feels good now other than some morning dizziness but has the what if questions of how long it might last.  She has been on FMLA since the middle of January when she went into the hospital and that technically runs out on April 15 but she knows she cannot go back to work until she has new scans on March 22.  She also will have a cardiac ultrasound.  When they found the blood clots in her legs 1 scan showed something in the heart and the cardiologist was not sure if it was a clot or possible cancer.  Even hearing the cardiologist say that was a shock because at that point she did not realize it cancer and her heart was a possibility.  It is in the lymph nodes in her chest, on her skull,and obviously in her lungs.  They are treating both the blood clots  and the cancer so no matter which it is she knows that is being treated.  This is all called targeted therapy and the medication she is on is the first line treatment.  The medication is called Octari sp?  [facility] purchased that medication.  The patient says she wrestles with perfectionism and always having to be the best and it is something that has always been a part of who she is.  That does not matter if it is something around the house or otherwise she wants to do the best and not being able to do much about this diagnosis and is very difficult for her.  She recognizes that she needs to be more relaxed and is asking herself what normal life looks like now.  She recognizes because of the diagnosis there will be things she cannot do that she has done in the past.  Acknowledged and validated the fact that she is projecting.  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  Her manager has been very good to her and she reports a good relationship with him but she is looking at the what if's of work as to when and how long until she can etc.  She knows now that mentally and emotionally she  is not prepared to handle the stress that comes along with her job.  Her husband is working but does not make the money she does and even if she went part-time there would be significant financial stress.  She does have good supports for her son having a nanny Monday through Thursday and her mother-in-law keeps her shot on Friday.  Her mother is coming back and forth from [place] as often as she can.  The patient does some relaxation breathing and I emphasized the importance of that.  Also introduced a mindfulness exercise involving the Beatles song twist and shout as a way of keeping her thoughts in the moment and being mindful in her current situation.  We talked about the  "process of acceptance."  Mental Status Exam: Appearance:  Well Groomed     Behavior: Appropriate  Motor: Normal  Speech/Language:  Clear and Coherent  Affect: anxious  Mood: anxious  Thought process: normal  Thought content:   WNL  Sensory/Perceptual disturbances:   WNL  Orientation: oriented to person, place, time/date, situation, day of week, month of year, and year  Attention: Good  Concentration: Good  Memory: WNL  Fund of knowledge:  Good  Insight:   Good  Judgment:  Good  Impulse Control: Good   Risk Assessment: Danger to Self:  No Self-injurious Behavior: No Danger to Others: No Duty to Warn:no Physical Aggression / Violence:No  Access to Firearms a concern: No  Gang Involvement:No   Treatment plan: To use cognitive behavioral therapy principles as well as elements of dialectical behavior therapy specifically mindfulness and relaxation techniques.  We will also employ elements of acceptance and commitment therapy.  Goals are to improve the patient's ability to manage anxiety symptoms and the unpredictability of her diagnosis.  We will use mindfulness to help her manage thoughts and worrisome thinking contributing to anxiety through CBT, DBT distress tolerance and mindfulness skills.  We will use CBT and DBT to help minimize depression so as to improve mood and return to a healthier level of functioning.  We will encourage shearing of feelings related to her diagnosis and what life looks like moving forward.  Interventions: Cognitive Behavioral Therapy and Mindfulness Meditation  Diagnosis: Adjustment disorder with mixed anxiety and depressed mood  Plan: F/u appointments scheduled  Sabas Sous, [facility]

## 2022-12-24 ENCOUNTER — Other Ambulatory Visit: Payer: Self-pay

## 2022-12-24 DIAGNOSIS — M79605 Pain in left leg: Secondary | ICD-10-CM

## 2022-12-24 DIAGNOSIS — I70223 Atherosclerosis of native arteries of extremities with rest pain, bilateral legs: Secondary | ICD-10-CM

## 2022-12-24 NOTE — Progress Notes (Signed)
[place] OFFICE PROGRESS NOTE  Patient, No Pcp Per No address on file  DIAGNOSIS: Stage IVB (T4, N3, M1b) non-small cell lung cancer, adenocarcinoma presented with large left diaphragmatic surface mass in addition to left hilar, infrahilar, AP window, right and left paratracheal, subcarinal as well as left internal mammary lymphadenopathy in addition to right gastric lymph node and solitary Small right frontal calvarial osseous metastatic disease with large malignant left pleural effusion diagnosed in January 2024.     Molecular studies by foundation [employer] as well as [employer] blood test showed:    EZR-ROS1 fusion approved by FDA Crizotinib, Entrectinib, Repotrectinib   approved in other indication Ceritinib, Lorlatinib Yes      3.6%   PD-L1 expression by foundation 1 that was 98%.  PRIOR THERAPY:  Status post left Pleurx catheter placement for drainage of recurrent left pleural effusion. Removed on 12/17/22  CURRENT THERAPY: Repotrectinib (Augtyro) 160 mg p.o. daily for the first 2 weeks and then 160 mg p.o. twice daily after that if tolerated.  First dose on  11/27/22. Starting from 12/12/22, she increase her dose to the maintenance dose of 160 mg BID.   INTERVAL HISTORY: [place] [address] 37 y.o. female returns to the clinic today for a follow up visit. She has had an unusual course in her health recently.  The patient is an otherwise healthy 37 year old never smoker who was recently diagnosed with stage IV lung cancer in January 2024.  She had a Pleurx catheter for malignant effusions, which since last being seen, has been removed on 12/17/22 due to improvement in her effusions.     She was found to have an actionable mutation with ROS1 and she started targeted treatment with Augtyro 160 mg p.o. daily on 11/27/22, which was increased on 12/12/22 to the maintenance dose of 160 mg BID.  She has been taking this for approximately 4 weeks total and thus far has tolerated it well.      From a symptom perspective, she feels a lot better compared to when she was first diagnosed. She reports her shortness of breath, and fatigue/energy significantly better. She sometimes gets fatigued with climbing stairs/short of breathing. She has a mild cough but she also had COVID-19 recently. Today she denies any fever, chills, night sweats, or unexplained weight loss. Her pain also seems to be better controlled at this time.  She has not taken oxycodone since being discharged in the hospital and she takes Tylenol if needed for pain. She is scheduled to see palliative care today.   She follows closely with vascular due to her bilateral lower extremity arterial occlusion s/p thrombectomy. She is on plavix, aspirin, and xarelto. She denies any bleeding at this time. She is scheduled to see Dr. Stanford Breed later this month.   On 01/07/23. She is taking gabapentin for neuropathy. She is taking MiraLAX for bowel prophylaxis every other day.  Denies any nausea, vomiting, or diarrhea.  Denies any headache or visual changes.  She has home health coming out to her house once a week. She sees Dr. Carlis Abbott at [facility] on 01/13/23. She is expected to have a scan at [facility] on 01/10/23. She needs another letter for [facility]. She is here today for evaluation and repeat labs to manage any adverse side effects of treatment.     MEDICAL HISTORY: Past Medical History:  Diagnosis Date   Anxiety    [employer] 11/21/2020   Cancer ([place])     ALLERGIES:  is allergic to hydrocodone and  tramadol.  MEDICATIONS:  Current Outpatient Medications  Medication Sig Dispense Refill   acetaminophen (TYLENOL) 325 MG tablet Take 650 mg by mouth every 6 (six) hours as needed for mild pain (for mild pain.).     ALPRAZolam (XANAX) 0.5 MG tablet Take 1 tablet (0.5 mg total) by mouth 3 (three) times daily as needed for anxiety. (Patient taking differently: Take 0.5 mg by mouth 2 (two) times daily as needed for anxiety.) 60 tablet 0   aspirin EC 81 MG  tablet Take 1 tablet (81 mg total) by mouth daily. Swallow whole. 30 tablet 12   benzonatate (TESSALON) 200 MG capsule Take 1 capsule (200 mg total) by mouth 4 (four) times daily as needed for cough. (Patient not taking: Reported on 12/13/2022) 120 capsule 2   Cholecalciferol (VITAMIN D-3) 125 MCG (5000 UT) TABS Take 1 tablet by mouth daily.     clopidogrel (PLAVIX) 75 MG tablet Take 1 tablet (75 mg total) by mouth daily. 30 tablet 11   FeFum-FePoly-FA-B Cmp-C-Biot (INTEGRA PLUS) CAPS Take 1 capsule by mouth every morning. (Patient not taking: Reported on 12/16/2022) 30 capsule 2   Ferrous Sulfate (IRON HIGH-POTENCY) 142 (45 Fe) MG TBCR Take 142 mg by mouth daily.     gabapentin (NEURONTIN) 100 MG capsule Take 200 mg by mouth 2 (two) times daily.     levonorgestrel (MIRENA, 52 MG,) 20 MCG/DAY IUD 1 each by Intrauterine route once.     Magnesium Oxide -Mg Supplement 400 MG CAPS Take 400 mg by mouth 2 (two) times daily. 60 capsule 0   metoCLOPramide (REGLAN) 5 MG tablet Take 1 tablet (5 mg total) by mouth every 6 (six) hours as needed for nausea. 30 tablet 1   ondansetron (ZOFRAN) 8 MG tablet Take 1 tablet (8 mg total) by mouth every 8 (eight) hours as needed for nausea or vomiting. 20 tablet 0   ondansetron (ZOFRAN-ODT) 8 MG disintegrating tablet Take 1 tablet (8 mg total) by mouth every 8 (eight) hours as needed for nausea or vomiting. 20 tablet 1   Ondansetron 4 MG FILM Take 4-8 mg by mouth every 6 (six) hours as needed. 15 each 0   OVER THE COUNTER MEDICATION Take 2 tablets by mouth daily. Beet root     oxyCODONE (ROXICODONE) 5 MG immediate release tablet Take 1 tablet (5 mg total) by mouth every 4 (four) hours as needed for severe pain. (Patient taking differently: Take 5 mg by mouth every 6 (six) hours as needed for severe pain.) 120 tablet 0   pantoprazole (PROTONIX) 40 MG tablet Take 1 tablet (40 mg total) by mouth daily. 30 tablet 3   polyethylene glycol (MIRALAX / GLYCOLAX) 17 g packet Take 17  g by mouth daily.     Repotrectinib 40 MG CAPS Take 160 mg by mouth daily. (Patient taking differently: Take 160 mg by mouth 2 (two) times daily.) 56 capsule 0   Rivaroxaban (XARELTO) 15 MG TABS tablet Take 1 tablet (15 mg total) by mouth 2 (two) times daily with a meal. 42 tablet 0   rivaroxaban (XARELTO) 20 MG TABS tablet Take 1 tablet (20 mg total) by mouth daily with supper. 30 tablet 3   Turmeric (QC TUMERIC COMPLEX PO) Take 500 mg by mouth daily.     zinc sulfate 220 (50 Zn) MG capsule Take 220 mg by mouth daily.     Current Facility-Administered Medications  Medication Dose Route Frequency Provider Last Rate Last Admin   famotidine (PEPCID) tablet 20  mg  20 mg Oral Once Acquanetta Chain, DO        SURGICAL HISTORY:  Past Surgical History:  Procedure Laterality Date   ABDOMINAL AORTOGRAM W/LOWER EXTREMITY N/A 11/27/2022   Procedure: ABDOMINAL AORTOGRAM W/LOWER EXTREMITY;  Surgeon: Cherre Robins, MD;  Location: [place] [facility];  Service: Cardiovascular;  Laterality: N/A;   IR PERC PLEURAL DRAIN W/INDWELL CATH W/IMG GUIDE  11/09/2022   PERIPHERAL VASCULAR ATHERECTOMY  11/27/2022   Procedure: PERIPHERAL VASCULAR ATHERECTOMY;  Surgeon: Cherre Robins, MD;  Location: [place] [facility];  Service: Cardiovascular;;   PERIPHERAL VASCULAR THROMBECTOMY  11/27/2022   Procedure: PERIPHERAL VASCULAR THROMBECTOMY;  Surgeon: Cherre Robins, MD;  Location: [place] [facility];  Service: Cardiovascular;;   REMOVAL OF PLEURAL DRAINAGE CATHETER N/A 12/17/2022   Procedure: MINOR REMOVAL OF PLEURAL DRAINAGE CATHETER;  Surgeon: Garner Nash, DO;  Location: [place];  Service: Cardiopulmonary;  Laterality: N/A;   WISDOM TOOTH EXTRACTION      REVIEW OF SYSTEMS:   Review of Systems  Constitutional: Positive for mild fatigue. Negative for appetite change, chills, fever and unexpected weight change.  HENT:  Negative for mouth sores, nosebleeds, sore throat and trouble swallowing.   Eyes:  Negative for eye problems and icterus.  Respiratory: Positive for mild dry cough and shortness of breath with exertion. Negative for hemoptysis and wheezing.   Cardiovascular: Negative for chest pain and leg swelling.  Gastrointestinal: Negative for abdominal pain, constipation, diarrhea, nausea and vomiting.  Genitourinary: Negative for bladder incontinence, difficulty urinating, dysuria, frequency and hematuria.   Musculoskeletal: Negative for back pain, gait problem, neck pain and neck stiffness.  Skin: Negative for itching and rash.  Neurological: Negative for dizziness, extremity weakness, gait problem, headaches, light-headedness and seizures.  Hematological: Negative for adenopathy. Does not bruise/bleed easily.  Psychiatric/Behavioral: Negative for confusion, depression and sleep disturbance. The patient is not nervous/anxious.     PHYSICAL EXAMINATION:  Blood pressure 112/69, pulse 98, temperature 98.2 F (36.8 C), temperature source Oral, resp. rate 17, weight 141 lb 4.8 oz (64.1 kg), SpO2 99 %, not currently breastfeeding.  ECOG PERFORMANCE STATUS: 0-1  Physical Exam  Constitutional: Oriented to person, place, and time and well-developed, well-nourished, and in no distress.  HENT:  Head: Normocephalic and atraumatic.  Mouth/Throat: Oropharynx is clear and moist. No oropharyngeal exudate.  Eyes: Conjunctivae are normal. Right eye exhibits no discharge. Left eye exhibits no discharge. No scleral icterus.  Neck: Normal range of motion. Neck supple.  Cardiovascular: Normal rate, regular rhythm, normal heart sounds and intact distal pulses.   Pulmonary/Chest: Effort normal and breath sounds normal. No respiratory distress. No wheezes. No rales.  Abdominal: Soft. Bowel sounds are normal. Exhibits no distension and no mass. There is no tenderness.  Musculoskeletal: Normal range of motion. Exhibits no edema.  Lymphadenopathy:    No cervical adenopathy.  Neurological: Alert and  oriented to person, place, and time. Exhibits normal muscle tone. Gait normal. Coordination normal.  Skin: Skin is warm and dry. No rash noted. Not diaphoretic. No erythema. No pallor.  Psychiatric: Mood, memory and judgment normal.  Vitals reviewed.  LABORATORY DATA: Lab Results  Component Value Date   WBC 6.2 12/26/2022   HGB 9.0 (L) 12/26/2022   HCT 27.5 (L) 12/26/2022   MCV 89.9 12/26/2022   PLT 586 (H) 12/26/2022      Chemistry      Component Value Date/Time   NA 142 12/26/2022 0858   NA 140 11/24/2020 1106  K 3.6 12/26/2022 0858   CL 107 12/26/2022 0858   CO2 29 12/26/2022 0858   BUN 12 12/26/2022 0858   BUN 7 11/24/2020 1106   CREATININE 0.67 12/26/2022 0858      Component Value Date/Time   CALCIUM 9.2 12/26/2022 0858   ALKPHOS 84 12/26/2022 0858   AST 16 12/26/2022 0858   ALT 11 12/26/2022 0858   BILITOT 0.4 12/26/2022 0858       RADIOGRAPHIC STUDIES:  DG Chest 2 View  Result Date: 12/13/2022 CLINICAL DATA:  Lung cancer, PleurX catheter, malignant pleural effusion EXAM: CHEST - 2 VIEW COMPARISON:  11/26/2022, 11/28/2022 FINDINGS: Improvement in the left effusion following basilar PleurX catheter placement. Residual airspace disease and volume loss in the left lower lung. Otherwise stable lung aeration. No pneumothorax. Stable heart size and vascularity. Trachea midline. No acute osseous finding. IMPRESSION: 1. Improved left effusion following PleurX catheter placement. 2. Residual left lower lung airspace disease/atelectasis. Electronically Signed   By: Jerilynn Mages.  Shick M.D.   On: 12/13/2022 15:11   CT ANGIO CHEST AORTA W/ & OR WO/CM & GATING ([facility] ONLY)  Result Date: 11/28/2022 CLINICAL DATA:  Thrombosis of lower extremities. Evaluate source. Known metastatic left lower lobe lung cancer EXAM: CT ANGIOGRAPHY CHEST WITHOUT AND WITH CONTRAST TECHNIQUE: Multidetector CT imaging of the chest was performed using the standard protocol before and after bolus  administration of intravenous contrast. Multiplanar CT image reconstructions and MIPs were obtained to evaluate the vascular anatomy. RADIATION DOSE REDUCTION: This exam was performed according to the departmental dose-optimization program which includes automated exposure control, adjustment of the mA and/or kV according to patient size and/or use of iterative reconstruction technique. CONTRAST:  76m OMNIPAQUE IOHEXOL 350 MG/ML SOLN COMPARISON:  11/26/2022, 11/15/2022 FINDINGS: Cardiovascular: The heart is mildly enlarged, with prominent biventricular dilatation. There is adherent thrombus within the left atrium seen between the ostia of the left pulmonary veins, measuring 1.3 x 0.8 cm. This is the likely source for lower extremity emboli. The thoracic aorta is unremarkable without aneurysm or dissection. Opacification of the pulmonary vasculature is suboptimal due to timing of contrast bolus. There is no large central pulmonary embolus. Mediastinum/Nodes: Diffuse lymphadenopathy throughout the mediastinum and left hilar regions consistent with the metabolically active adenopathy on recent PET scan. No significant change. Thyroid, trachea, and esophagus are grossly unremarkable. Lungs/Pleura: Masslike left lower lobe consolidation seen on prior exams is again identified, consistent with known history of left lower lobe adenocarcinoma. The circumferential left pleural thickening seen on prior exams is again identified. Decreased left pleural fluid with indwelling left pleural drain. Stable nodularity within the right middle and right lower lobe consistent with contralateral pulmonary metastases. There are no new areas of consolidation within either lung. No pneumothorax. Central airways are patent. Upper Abdomen: There is a small area of decreased cortical enhancement within the upper pole right kidney, incompletely imaged due to slice selection. Renal infarct cannot be excluded in light of the left atrial thrombus  described above and history of lower extremity thromboembolic disease. No other acute upper abdominal findings. Musculoskeletal: No acute or destructive bony lesions. Reconstructed images demonstrate no additional findings. Review of the MIP images confirms the above findings. IMPRESSION: 1. Thrombus within the lateral aspect of the left atrium between the left pulmonary vein ostia. 2. Stable findings of metastatic lung cancer, with primary left lower lobe masslike consolidation, left-sided pleural thickening, mediastinal and hilar adenopathy, and right pulmonary nodules as seen on recent PET scan. 3. Decreased left  pleural effusion with indwelling left pleural drain. 4. Possible cortical infarct upper pole right kidney, incompletely evaluated on this study. Critical Value/emergent results were called by telephone at the time of interpretation on 11/28/2022 at 8:29 pm to provider DR ROBBINS, who verbally acknowledged these results. Electronically Signed   By: Randa Ngo M.D.   On: 11/28/2022 20:40   PERIPHERAL VASCULAR CATHETERIZATION  Result Date: 11/27/2022 DATE OF SERVICE: 11/27/2022  PATIENT:  Kensli Rizzo  37 y.o. female  PRE-OPERATIVE DIAGNOSIS: subacute thrombosis of left profunda and popliteal arteries causing ischemic rest pain  POST-OPERATIVE DIAGNOSIS:  Same  PROCEDURE:  1) Ultrasound guided right common femoral artery access 2) Aortogram 3) left lower extremity angiogram with third order cannulation 4) mechanical thrombectomy of left profunda femoris artery (8 [place] JETi) 5) mechanical thrombectomy of left popliteal artery  (6 [place] JETi) 6) laser atherectomy and angioplasty (3 x 265m) of left anterior tibial artery 7) Conscious sedation (129 minutes)   SURGEON:  TYevonne Aline HStanford Breed MD  ASSISTANT: none  ANESTHESIA:   local and IV sedation  ESTIMATED BLOOD LOSS: 7093m LOCAL MEDICATIONS USED:  LIDOCAINE  COUNTS: confirmed correct.  PATIENT DISPOSITION:  PACU - hemodynamically stable.  Delay start of  Pharmacological VTE agent (>24hrs) due to surgical blood loss or risk of bleeding: no  INDICATION FOR PROCEDURE: RoPranshi Vanhoutens a [age].o. female with occlusion of left profunda femoris and popliteal arteries causing ischemic rest pain.  I felt this was likely subacute given the weeks long onset of symptoms.  Preoperative duplex suggested left profunda femoris and popliteal artery occlusions. After careful discussion of risks, benefits, and alternatives the patient was offered angiography with possible intervention. The patient understood and wished to proceed.  OPERATIVE FINDINGS: Terminal aorta and iliac arteries: Widely patent without flow-limiting stenosis  Left lower extremity: Common femoral artery: Healthy and widely patent Profunda femoris artery: Occluded at its origin.  Reconstitution at second-order branching in the mid thigh. Superficial femoral artery: Healthy and widely patent Popliteal artery: Above-knee segment healthy and widely patent.  Artery abruptly occludes behind the knee.  Robust collateralization around the popliteal occlusion suggesting chronicity to the occlusion.  Anterior tibial artery: Reconstitutes several centimeters distal to its origin from geniculate collaterals Tibioperoneal trunk: Occluded Peroneal artery:  Reconstitutes several centimeters distal to its origin from geniculate collaterals Posterior tibial artery:  Reconstitutes several centimeters distal to its origin from geniculate collaterals. Pedal circulation: Fills via collaterals  DESCRIPTION OF PROCEDURE: After identification of the patient in the pre-operative holding area, the patient was transferred to the operating room. The patient was positioned supine on the operating room table.  Anesthesia was induced. The groins was prepped and draped in standard fashion. A surgical pause was performed confirming correct patient, procedure, and operative location.  The right groin was anesthetized with subcutaneous injection of  1% lidocaine. Using ultrasound guidance, the right common femoral artery was accessed with micropuncture technique. Fluoroscopy was used to confirm cannulation over the femoral head. The 51F sheath was upsized to 4F.  A Benson wire was advanced into the distal aorta. Over the wire an omni flush catheter was advanced to the level of L2. Aortogram was performed - see above for details.  The left common iliac artery was selected with an omniflush catheter and Glidewire advantage guidewire. The wire was advanced into the common femoral artery. Over the wire the omni flush catheter was advanced into the external iliac artery. Selective angiography was performed - see above for details.  The decision was made to intervene. The patient was heparinized with 7000 units of heparin. The 52F sheath was exchanged for a 8 [place] by 45 cm sheath. Selective angiography of the left lower extremity was performed prior to intervention.  The lesions were treated with: mechanical thrombectomy of left profunda femoris artery (8 [place] JETi) mechanical thrombectomy of left popliteal artery  (6 [place] JETi) laser atherectomy and angioplasty (3 x 290m) of left anterior tibial artery  Completion angiography revealed: Profunda femoris thrombosis resolved Popliteal artery occlusion recanalized with combination of thrombectomy, angioplasty, and arthrectomy. Not able to reestablish inline flow because of significant thrombus in the ankle and foot. The foot does fill in some of the native vessels do opacify with contrast.   A Perclose device was used to close the arteriotomy. Hemostasis was excellent upon completion.  Conscious sedation was administered with the use of IV fentanyl and midazolam under continuous physician and nurse monitoring.  Heart rate, blood pressure, and oxygen saturation were continuously monitored.  Total sedation time was 129 minutes  Upon completion of the case instrument and sharps counts were confirmed correct. The  patient was transferred to the PACU in good condition. I was present for all portions of the procedure.  PLAN: Resume heparin.  Monitor for clinical improvement.  Will reevaluate in the morning.  If foot pain persists, will proceed to the OR for operative thrombectomy.  TYevonne Aline HStanford Breed MD FJackson Memorial HospitalVascular and Vein Specialists of GMorgan Medical CenterPhone Number: ([phone]/04/2023 1:11 PM    ECHOCARDIOGRAM COMPLETE  Result Date: 11/27/2022    ECHOCARDIOGRAM REPORT   Patient Name:   RKERINGTON STEURERDate of Exam: 11/27/2022 Medical Rec #:  [id number]   Height:       66.0 in Accession #:    [id number]  Weight:       130.1 lb Date of Birth:  [birth year]    BSA:          1.666 m Patient Age:    89years     BP:           125/74 mmHg Patient Gender: F            HR:           110 bpm. Exam Location:  Inpatient Procedure: 2D Echo, Cardiac Doppler and Color Doppler Indications:    PAD  History:        Patient has no prior history of Echocardiogram examinations.                 Lung cancer.  Sonographer:    AClayton LefortReferring Phys: [id number] 1. Left ventricular ejection fraction, by estimation, is 50 to 55%. The left ventricle has low normal function. The left ventricle has no regional wall motion abnormalities. Left ventricular diastolic parameters were normal.  2. Right ventricular systolic function is normal. The right ventricular size is normal.  3. The mitral valve is normal in structure. No evidence of mitral valve regurgitation. No evidence of mitral stenosis.  4. The aortic valve was not well visualized. Aortic valve regurgitation is not visualized. No aortic stenosis is present.  5. The inferior vena cava is normal in size with greater than 50% respiratory variability, suggesting right atrial pressure of 3 mmHg. FINDINGS  Left Ventricle: Left ventricular ejection fraction, by estimation, is 50 to 55%. The left ventricle has low normal function. The left ventricle has no regional wall  motion abnormalities.  The left ventricular internal cavity size was normal in size. There is no left ventricular hypertrophy. Left ventricular diastolic parameters were normal. Right Ventricle: The right ventricular size is normal. No increase in right ventricular wall thickness. Right ventricular systolic function is normal. Left Atrium: Left atrial size was normal in size. Right Atrium: Right atrial size was normal in size. Pericardium: There is no evidence of pericardial effusion. Mitral Valve: The mitral valve is normal in structure. No evidence of mitral valve regurgitation. No evidence of mitral valve stenosis. Tricuspid Valve: The tricuspid valve is normal in structure. Tricuspid valve regurgitation is trivial. Aortic Valve: The aortic valve was not well visualized. Aortic valve regurgitation is not visualized. No aortic stenosis is present. Aortic valve mean gradient measures 3.0 mmHg. Aortic valve peak gradient measures 4.8 mmHg. Aortic valve area, by VTI measures 2.83 cm. Pulmonic Valve: The pulmonic valve was not well visualized. Pulmonic valve regurgitation is trivial. Aorta: The aortic root and ascending aorta are structurally normal, with no evidence of dilitation. Venous: The inferior vena cava is normal in size with greater than 50% respiratory variability, suggesting right atrial pressure of 3 mmHg. IAS/Shunts: The interatrial septum was not well visualized.  LEFT VENTRICLE PLAX 2D LVIDd:         4.40 cm LVIDs:         3.20 cm LV PW:         0.90 cm LV IVS:        0.70 cm LVOT diam:     2.10 cm LV SV:         50 LV SV Index:   30 LVOT Area:     3.46 cm  RIGHT VENTRICLE             IVC RV Basal diam:  2.40 cm     IVC diam: 1.20 cm RV S prime:     12.10 cm/s TAPSE (M-mode): 2.2 cm LEFT ATRIUM             Index        RIGHT ATRIUM          Index LA diam:        2.20 cm 1.32 cm/m   RA Area:     7.89 cm LA Vol (A2C):   28.1 ml 16.87 ml/m  RA Volume:   13.40 ml 8.05 ml/m LA Vol (A4C):   30.6 ml  18.37 ml/m LA Biplane Vol: 31.8 ml 19.09 ml/m  AORTIC VALVE AV Area (Vmax):    2.76 cm AV Area (Vmean):   2.74 cm AV Area (VTI):     2.83 cm AV Vmax:           110.00 cm/s AV Vmean:          82.300 cm/s AV VTI:            0.175 m AV Peak Grad:      4.8 mmHg AV Mean Grad:      3.0 mmHg LVOT Vmax:         87.70 cm/s LVOT Vmean:        65.200 cm/s LVOT VTI:          0.143 m LVOT/AV VTI ratio: 0.82  AORTA Ao Root diam: 3.30 cm Ao Asc diam:  2.70 cm  SHUNTS Systemic VTI:  0.14 m Systemic Diam: 2.10 cm Oswaldo Milian MD Electronically signed by Oswaldo Milian MD Signature Date/Time: 11/27/2022/6:00:10 PM    Final    VAS [place] LOWER EXTREMITY  ARTERIAL DUPLEX  Result Date: 11/26/2022 LOWER EXTREMITY ARTERIAL DUPLEX STUDY Patient Name:  KATIANN HUBBY  Date of Exam:   11/26/2022 Medical Rec #: [id number]     Accession #:    [id number] Date of Birth: 06-15-1986      Patient Gender: F Patient Age:   52 years Exam Location:  [facility] Procedure:      VAS [place] LOWER EXTREMITY ARTERIAL DUPLEX Referring Phys: [facility] MOHAMED --------------------------------------------------------------------------------  Indications: Incidental finding on lower extremity venous examination- bilateral              poplital artery occlusion in the setting of pain for 1-2 weeks. New              paresthesia sensation- left worse than right. Other Factors: Stage 4 lung cancer.  Current ABI: N/A Comparison Study: No prior studies. Performing Technologist: Darlin Coco RDMS RVT  Examination Guidelines: A complete evaluation includes B-mode imaging, spectral Doppler, color Doppler, and power Doppler as needed of all accessible portions of each vessel. Bilateral testing is considered an integral part of a complete examination. Limited examinations for reoccurring indications may be performed as noted.  +-----------+--------+-----+--------+---------+-----------+ RIGHT      PSV cm/sRatioStenosisWaveform Comments     +-----------+--------+-----+--------+---------+-----------+ CFA Prox   74                   triphasic            +-----------+--------+-----+--------+---------+-----------+ CFA Mid    61                   triphasic            +-----------+--------+-----+--------+---------+-----------+ CFA Distal 59                   triphasic            +-----------+--------+-----+--------+---------+-----------+ DFA        74                   triphasic            +-----------+--------+-----+--------+---------+-----------+ SFA Prox   69                   triphasic            +-----------+--------+-----+--------+---------+-----------+ SFA Mid    76                   triphasic            +-----------+--------+-----+--------+---------+-----------+ SFA Distal 48                   triphasic            +-----------+--------+-----+--------+---------+-----------+ POP Prox   33                   triphasic            +-----------+--------+-----+--------+---------+-----------+ POP Mid                 occluded                     +-----------+--------+-----+--------+---------+-----------+ POP Distal              occluded                     +-----------+--------+-----+--------+---------+-----------+ TP Trunk                occluded                     +-----------+--------+-----+--------+---------+-----------+  ATA Prox   22                   biphasic             +-----------+--------+-----+--------+---------+-----------+ ATA Mid    16                   biphasic             +-----------+--------+-----+--------+---------+-----------+ ATA Distal 15                   biphasic             +-----------+--------+-----+--------+---------+-----------+ PTA Prox   12                   biphasic Collaterals +-----------+--------+-----+--------+---------+-----------+ PTA Mid    27                   biphasic              +-----------+--------+-----+--------+---------+-----------+ PTA Distal 27                   biphasic             +-----------+--------+-----+--------+---------+-----------+ PERO Prox               occluded                     +-----------+--------+-----+--------+---------+-----------+ PERO Mid                occluded                     +-----------+--------+-----+--------+---------+-----------+ PERO Distal             occluded                     +-----------+--------+-----+--------+---------+-----------+ DP         8                    biphasic             +-----------+--------+-----+--------+---------+-----------+  +-----------+--------+-----+--------+----------+--------------------+ LEFT       PSV cm/sRatioStenosisWaveform  Comments             +-----------+--------+-----+--------+----------+--------------------+ CFA Prox   57                   triphasic                      +-----------+--------+-----+--------+----------+--------------------+ CFA Mid    67                   triphasic                      +-----------+--------+-----+--------+----------+--------------------+ CFA Distal 71                   triphasic                      +-----------+--------+-----+--------+----------+--------------------+ DFA                     occluded                               +-----------+--------+-----+--------+----------+--------------------+ SFA Prox   69  triphasic                      +-----------+--------+-----+--------+----------+--------------------+ SFA Mid    92                   triphasic                      +-----------+--------+-----+--------+----------+--------------------+ SFA Distal 42                   triphasic                      +-----------+--------+-----+--------+----------+--------------------+ POP Prox   37                   triphasic                       +-----------+--------+-----+--------+----------+--------------------+ POP Mid                 occluded                               +-----------+--------+-----+--------+----------+--------------------+ POP Distal              occluded                               +-----------+--------+-----+--------+----------+--------------------+ TP Trunk                occluded                               +-----------+--------+-----+--------+----------+--------------------+ ATA Prox   41                   biphasic                       +-----------+--------+-----+--------+----------+--------------------+ ATA Mid    22                   biphasic                       +-----------+--------+-----+--------+----------+--------------------+ ATA Distal              occluded                               +-----------+--------+-----+--------+----------+--------------------+ PTA Prox                occluded          Collaterals prox-mid +-----------+--------+-----+--------+----------+--------------------+ PTA Mid    25                   monophasic                     +-----------+--------+-----+--------+----------+--------------------+ PTA Distal 23                   monophasic                     +-----------+--------+-----+--------+----------+--------------------+ PERO Prox               occluded                               +-----------+--------+-----+--------+----------+--------------------+  PERO Mid                occluded                               +-----------+--------+-----+--------+----------+--------------------+ PERO Distal25                             Collateral           +-----------+--------+-----+--------+----------+--------------------+ DP                      occluded                               +-----------+--------+-----+--------+----------+--------------------+  Summary: Right: Total occlusion noted in the popliteal  artery and extending into the tibioperoneal trunk. Total occlusion noted in the peroneal artery. Absent great toe waveform. Left: Total occlusion noted in the deep femoral artery. Total occlusion noted in the mid to distal popliteal artery and extending into the tibioperoneal trunk. Total occlusion noted in the distal segment of the anterior tibial artery. Total occlusion noted in the peroneal artery. Total occlusion noted in the dorsal pedis artery. Dampened great toe waveform.  See table(s) above for measurements and observations. Electronically signed by Harold Barban MD on 11/26/2022 at 11:48:53 PM.    Final    DG Chest 1 View  Result Date: 11/26/2022 CLINICAL DATA:  Follow-up pleural effusion EXAM: CHEST  1 VIEW COMPARISON:  11/15/2022 FINDINGS: Right chest remains clear. Pleural catheter remains in place at the inferior pleural space on the left. There is a persistent pleural effusion on the left, moderate in size, with atelectasis of the left lower lung. IMPRESSION: Persistent moderate left effusion with left lower lung atelectasis. Pleural catheter remains in place at the inferior left pleural space. Electronically Signed   By: Nelson Chimes M.D.   On: 11/26/2022 20:59     ASSESSMENT/PLAN:  This is a very pleasant 37 year old never smoker Caucasian female diagnosed with stage IV (T4, N3, M1 B) non-small cell lung cancer, adenocarcinoma.  She presented with a large left diaphragmatic surface mass in addition to left hilar, infrahilar, AP window, right and left paratracheal, subcarinal, left internal mammary, and right gastric lymphadenopathy in addition to a solitary small right frontal calvarial osseous metastatic lesion with a large malignant left pleural effusion.  She was diagnosed in January 2024.   Her molecular studies show that she has an actionable mutation with ROS1 fusion.  She also has a PD-L1 expression of 98%.   She underwent a Pleurx catheter placement under the care of Dr. Valeta Harms.  This  was removed on 12/17/22.    The patient had bilateral lower extremity occlusion and underwent thrombectomy under the care of Dr. Luan Pulling.  She is currently on triple therapy with aspirin, Plavix, and Xarelto. She sees him next in March 2024   The patient is currently on targeted treatment with repotrectinib initially as 160 mg p.o. daily for 2 weeks followed by increase of the dose to 160 mg p.o. twice daily starting on 12/12/22. Her first dose of treatment was on 11/27/22.  She has been tolerating this well and feels better from a symptom standpoint.    The patient was seen with Dr. Julien Nordmann today. Labs were reviewed. Dr. Julien Nordmann recommends she continue on the same treatment at the same  dose.    We will see her back for follow-up visit in 4 weeks for evaluation. She will have her scan performed at [facility] on 01/10/23. We will reach out to radiology to make sure her scans can be reviewed/compared at [facility].    She will continue to be followed by the palliative care team for which she is currently taking gabapentin and oxycodone. She is scheduled to see them today.    She will continue to have home health come out to her house.      Today, she had lab work for hypercoagulable panel as well as a uric acid, CK, and iron studies. The results are pending but we will call her with further instructions if results abnormal and if further management is needed.  I have given her a letter for this month for FMLA.   The patient was advised to call immediately if she has any concerning symptoms in the interval. The patient voices understanding of current disease status and treatment options and is in agreement with the current care plan. All questions were answered. The patient knows to call the clinic with any problems, questions or concerns. We can certainly see the patient much sooner if necessary   Orders Placed This Encounter  Procedures   Pregnancy, urine    Standing Status:   Future    Number of  Occurrences:   1    Standing Expiration Date:   12/26/2023   CBC with Differential ([place] Only)    Standing Status:   Future    Standing Expiration Date:   12/26/2023   CMP ([place] only)    Standing Status:   Future    Standing Expiration Date:   12/26/2023   CK, total    Standing Status:   Future    Standing Expiration Date:   12/26/2023   Uric acid    Standing Status:   Future    Standing Expiration Date:   12/26/2023   Pregnancy, urine    Standing Status:   Future    Standing Expiration Date:   12/26/2023     Tobe Sos Sharilyn Geisinger, PA-C 12/26/22  ADDENDUM: Hematology/Oncology Attending: I had a face-to-face encounter with the patient today.  I reviewed her records, lab and recommended her care plan.  This is a very pleasant 37 years old white female with a stage IVb (T4, N3, M1b) non-small cell lung cancer, adenocarcinoma diagnosed in January 2024 with positive EZR-ROS1 fusion and PD-L1 expression of 98%. The patient is status post left Pleurx catheter placement for drainage of recurrent left pleural effusion that was removed on December 17, 2022 and she has no evidence of recurrent effusion. The patient started treatment with repotrectinib initially 160 mg p.o. daily for 14 days and currently on 160 mg p.o. twice daily since December 12, 2022.  The patient has been tolerating her treatment with repotrectinib fairly well with no concerning adverse effect except for occasional dizzy spells. 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 When seen today she is feeling much better and close to her baseline before the diagnosis of lung cancer. There was a lot of issues with coverage of repotrectinib by her insurance and she is currently on treatment with repotrectinib with the initial 1 months 3 medication and now on bridge supply from [employer] for 2 more months until we resolve her issue  with the insurance company. The patient was also seen  by Dr. Carlis Abbott at [facility] and she is expected to have repeat CT scan of the chest, abdomen and pelvis on January 10, 2023 at [facility]. I recommended for the patient to continue her current treatment with repotrectinib with the same dose. I will see her back for follow-up visit in few weeks after her scan results for close monitoring of her condition. She was advised to call immediately if she has any other concerning issues in the interval. The total time spent in the appointment was 40 minutes.  Disclaimer: This note was dictated with voice recognition software. Similar sounding words can inadvertently be transcribed and may be missed upon review. Eilleen Kempf, MD

## 2022-12-24 NOTE — Telephone Encounter (Signed)
Oral Oncology Patient Advocate Encounter  [place] pharmacy, [phone], to check status of Augtyro.  The medication is being packaged and is scheduled for delivery on 12/25/22 by 7pm.  UPS tracking #: [phone]  I have spoken with the patient and shared the tracking information with them.  [name], CPhT-Adv Oncology Pharmacy Patient [place] Direct Number: [phone]  Fax: [phone]

## 2022-12-25 ENCOUNTER — Other Ambulatory Visit (HOSPITAL_COMMUNITY): Payer: Self-pay

## 2022-12-25 NOTE — Telephone Encounter (Signed)
Oral Chemotherapy Pharmacist Encounter   Called to follow up on status of Urgent Level 1 Appeal for Augtyro Medical Necessity that was faxed 12/20/22. Spoke to a representative named Hilda Blades who stated the fax number that had been provided by the benefits coverage review representative on 12/20/22 was incorrect, and correct fax number for information to be sent to was [phone].  Urgent letter of medical necessity and supporting documentation refaxed to [phone].  Patient's case ID remains [id number].  Oral Oncology Clinic will continue to follow. Of note - patient is receiving bridge program of 30 days of Augtyro today (12/25/22) while we continue appealing insurance department.   Leron Croak, PharmD, BCPS, [facility] Hematology/Oncology Clinical Pharmacist [facility] and [place] [phone] 12/25/2022 12:56 PM

## 2022-12-26 ENCOUNTER — Inpatient Hospital Stay: Payer: BC Managed Care – PPO

## 2022-12-26 ENCOUNTER — Other Ambulatory Visit (HOSPITAL_COMMUNITY): Payer: Self-pay

## 2022-12-26 ENCOUNTER — Inpatient Hospital Stay: Payer: BC Managed Care – PPO | Attending: Internal Medicine | Admitting: Nurse Practitioner

## 2022-12-26 ENCOUNTER — Inpatient Hospital Stay (HOSPITAL_BASED_OUTPATIENT_CLINIC_OR_DEPARTMENT_OTHER): Payer: BC Managed Care – PPO | Admitting: Physician Assistant

## 2022-12-26 VITALS — BP 112/69 | HR 98 | Temp 98.2°F | Resp 17 | Wt 141.3 lb

## 2022-12-26 DIAGNOSIS — F1290 Cannabis use, unspecified, uncomplicated: Secondary | ICD-10-CM | POA: Insufficient documentation

## 2022-12-26 DIAGNOSIS — Z7901 Long term (current) use of anticoagulants: Secondary | ICD-10-CM | POA: Insufficient documentation

## 2022-12-26 DIAGNOSIS — C3492 Malignant neoplasm of unspecified part of left bronchus or lung: Secondary | ICD-10-CM | POA: Insufficient documentation

## 2022-12-26 DIAGNOSIS — R530 Neoplastic (malignant) related fatigue: Secondary | ICD-10-CM

## 2022-12-26 DIAGNOSIS — Z515 Encounter for palliative care: Secondary | ICD-10-CM | POA: Diagnosis not present

## 2022-12-26 DIAGNOSIS — Z7902 Long term (current) use of antithrombotics/antiplatelets: Secondary | ICD-10-CM | POA: Diagnosis not present

## 2022-12-26 DIAGNOSIS — Z7189 Other specified counseling: Secondary | ICD-10-CM

## 2022-12-26 DIAGNOSIS — Z79899 Other long term (current) drug therapy: Secondary | ICD-10-CM | POA: Diagnosis not present

## 2022-12-26 DIAGNOSIS — Z7982 Long term (current) use of aspirin: Secondary | ICD-10-CM | POA: Insufficient documentation

## 2022-12-26 DIAGNOSIS — C7951 Secondary malignant neoplasm of bone: Secondary | ICD-10-CM | POA: Insufficient documentation

## 2022-12-26 DIAGNOSIS — M792 Neuralgia and neuritis, unspecified: Secondary | ICD-10-CM

## 2022-12-26 DIAGNOSIS — Z793 Long term (current) use of hormonal contraceptives: Secondary | ICD-10-CM | POA: Diagnosis not present

## 2022-12-26 DIAGNOSIS — D649 Anemia, unspecified: Secondary | ICD-10-CM | POA: Diagnosis not present

## 2022-12-26 DIAGNOSIS — I70229 Atherosclerosis of native arteries of extremities with rest pain, unspecified extremity: Secondary | ICD-10-CM

## 2022-12-26 LAB — CBC WITH DIFFERENTIAL (CANCER CENTER ONLY)
Abs Immature Granulocytes: 0.02 10*3/uL (ref 0.00–0.07)
Basophils Absolute: 0 10*3/uL (ref 0.0–0.1)
Basophils Relative: 0 %
Eosinophils Absolute: 0.1 10*3/uL (ref 0.0–0.5)
Eosinophils Relative: 2 %
HCT: 27.5 % — ABNORMAL LOW (ref 36.0–46.0)
Hemoglobin: 9 g/dL — ABNORMAL LOW (ref 12.0–15.0)
Immature Granulocytes: 0 %
Lymphocytes Relative: 24 %
Lymphs Abs: 1.5 10*3/uL (ref 0.7–4.0)
MCH: 29.4 pg (ref 26.0–34.0)
MCHC: 32.7 g/dL (ref 30.0–36.0)
MCV: 89.9 fL (ref 80.0–100.0)
Monocytes Absolute: 0.3 10*3/uL (ref 0.1–1.0)
Monocytes Relative: 5 %
Neutro Abs: 4.3 10*3/uL (ref 1.7–7.7)
Neutrophils Relative %: 69 %
Platelet Count: 586 10*3/uL — ABNORMAL HIGH (ref 150–400)
RBC: 3.06 MIL/uL — ABNORMAL LOW (ref 3.87–5.11)
RDW: 15 % (ref 11.5–15.5)
WBC Count: 6.2 10*3/uL (ref 4.0–10.5)
nRBC: 0 % (ref 0.0–0.2)

## 2022-12-26 LAB — CMP (CANCER CENTER ONLY)
ALT: 11 U/L (ref 0–44)
AST: 16 U/L (ref 15–41)
Albumin: 4.4 g/dL (ref 3.5–5.0)
Alkaline Phosphatase: 84 U/L (ref 38–126)
Anion gap: 6 (ref 5–15)
BUN: 12 mg/dL (ref 6–20)
CO2: 29 mmol/L (ref 22–32)
Calcium: 9.2 mg/dL (ref 8.9–10.3)
Chloride: 107 mmol/L (ref 98–111)
Creatinine: 0.67 mg/dL (ref 0.44–1.00)
GFR, Estimated: >60 mL/min (ref >60)
Glucose, Bld: 74 mg/dL (ref 70–99)
Potassium: 3.6 mmol/L (ref 3.5–5.1)
Sodium: 142 mmol/L (ref 135–145)
Total Bilirubin: 0.4 mg/dL (ref 0.3–1.2)
Total Protein: 7.4 g/dL (ref 6.5–8.1)

## 2022-12-26 LAB — URIC ACID: Uric Acid, Serum: 3.5 mg/dL (ref 2.5–7.1)

## 2022-12-26 LAB — ANTITHROMBIN III: AntiThromb III Func: 127 % — ABNORMAL HIGH (ref 75–120)

## 2022-12-26 LAB — PREGNANCY, URINE: Preg Test, Ur: NEGATIVE

## 2022-12-26 LAB — FERRITIN: Ferritin: 89 ng/mL (ref 11–307)

## 2022-12-26 LAB — IRON AND IRON BINDING CAPACITY (CC-WL,HP ONLY)
Iron: 86 ug/dL (ref 28–170)
Saturation Ratios: 25 % (ref 10.4–31.8)
TIBC: 344 ug/dL (ref 250–450)
UIBC: 258 ug/dL (ref 148–442)

## 2022-12-26 LAB — CK: Total CK: 177 U/L (ref 38–234)

## 2022-12-26 NOTE — Progress Notes (Signed)
[facility]  [phone] [phone]   Name: Melissa Pineda Date: 12/26/2022 MRN: [id number]  DOB: 1986/08/27  Patient Care Team: Patient, No Pcp Per as PCP - General ([place]) Acquanetta Chain, DO as Consulting Physician (Hospice and Palliative Medicine)    REASON FOR CONSULTATION: Melissa Pineda is a 37 y.o. female with oncologic medical history including newly diagnosed ROS1 Non-Small Cell Lung Cancer, stage IV at time of diagnosis s/p pleurx for malignant pleural effusion and known diffuse metastatic lymphadenopathy and osseous mets (10/2022). Currently on Repotrectinib and tolerating well. Recently underwent thrombectomy due to bilateral lower extremity arterial occlusion per Dr.Hawkens.  Patient is on Plavix, aspirin, Xarelto.  Palliative ask to see for symptom management and goals of care.    SOCIAL HISTORY:     reports that she has never smoked. She has never used smokeless tobacco. She reports current alcohol use of about 7.0 standard drinks of alcohol per week. She reports current drug use. Drug: Marijuana.  ADVANCE DIRECTIVES:    CODE STATUS: Full code  PAST MEDICAL HISTORY: Past Medical History:  Diagnosis Date   Anxiety    Phreesia 11/21/2020   Cancer ([place])     PAST SURGICAL HISTORY:  Past Surgical History:  Procedure Laterality Date   ABDOMINAL AORTOGRAM W/LOWER EXTREMITY N/A 11/27/2022   Procedure: ABDOMINAL AORTOGRAM W/LOWER EXTREMITY;  Surgeon: Cherre Robins, MD;  Location: [facility];  Service: Cardiovascular;  Laterality: N/A;   IR PERC PLEURAL DRAIN W/INDWELL CATH W/IMG GUIDE  11/09/2022   PERIPHERAL VASCULAR ATHERECTOMY  11/27/2022   Procedure: PERIPHERAL VASCULAR ATHERECTOMY;  Surgeon: Cherre Robins, MD;  Location: [facility];  Service: Cardiovascular;;   PERIPHERAL VASCULAR THROMBECTOMY  11/27/2022   Procedure: PERIPHERAL VASCULAR THROMBECTOMY;  Surgeon: Cherre Robins,  MD;  Location: [facility];  Service: Cardiovascular;;   REMOVAL OF PLEURAL DRAINAGE CATHETER N/A 12/17/2022   Procedure: MINOR REMOVAL OF PLEURAL DRAINAGE CATHETER;  Surgeon: Garner Nash, DO;  Location: [facility];  Service: Cardiopulmonary;  Laterality: N/A;   WISDOM TOOTH EXTRACTION      HEMATOLOGY/ONCOLOGY HISTORY:  Oncology History  Adenocarcinoma of left lung, stage 4 ([place])  11/07/2022 Initial Diagnosis   Adenocarcinoma of left lung, stage 4 ([place])   11/20/2022 - 11/20/2022 Chemotherapy   Patient is on Treatment Plan : LUNG Pemetrexed + Carboplatin + Bevacizumab q21d        ALLERGIES:  is allergic to hydrocodone and tramadol.  MEDICATIONS:  Current Outpatient Medications  Medication Sig Dispense Refill   acetaminophen (TYLENOL) 325 MG tablet Take 650 mg by mouth every 6 (six) hours as needed for mild pain (for mild pain.).     ALPRAZolam (XANAX) 0.5 MG tablet Take 1 tablet (0.5 mg total) by mouth 3 (three) times daily as needed for anxiety. (Patient taking differently: Take 0.5 mg by mouth 2 (two) times daily as needed for anxiety.) 60 tablet 0   aspirin EC 81 MG tablet Take 1 tablet (81 mg total) by mouth daily. Swallow whole. 30 tablet 12   Cholecalciferol (VITAMIN D-3) 125 MCG (5000 UT) TABS Take 1 tablet by mouth daily.     clopidogrel (PLAVIX) 75 MG tablet Take 1 tablet (75 mg total) by mouth daily. 30 tablet 11   Ferrous Sulfate (IRON HIGH-POTENCY) 142 (45 Fe) MG TBCR Take 142 mg by mouth daily.     gabapentin (NEURONTIN) 100 MG capsule Take 200 mg by mouth 2 (  two) times daily.     levonorgestrel (MIRENA, 52 MG,) 20 MCG/DAY IUD 1 each by Intrauterine route once.     Magnesium Oxide -Mg Supplement 400 MG CAPS Take 400 mg by mouth 2 (two) times daily. 60 capsule 0   metoCLOPramide (REGLAN) 5 MG tablet Take 1 tablet (5 mg total) by mouth every 6 (six) hours as needed for nausea. 30 tablet 1   ondansetron (ZOFRAN) 8 MG tablet Take 1 tablet (8 mg total) by mouth every  8 (eight) hours as needed for nausea or vomiting. 20 tablet 0   ondansetron (ZOFRAN-ODT) 8 MG disintegrating tablet Take 1 tablet (8 mg total) by mouth every 8 (eight) hours as needed for nausea or vomiting. 20 tablet 1   Ondansetron 4 MG FILM Take 4-8 mg by mouth every 6 (six) hours as needed. 15 each 0   OVER THE COUNTER MEDICATION Take 2 tablets by mouth daily. Beet root     oxyCODONE (ROXICODONE) 5 MG immediate release tablet Take 1 tablet (5 mg total) by mouth every 4 (four) hours as needed for severe pain. (Patient taking differently: Take 5 mg by mouth every 6 (six) hours as needed for severe pain.) 120 tablet 0   pantoprazole (PROTONIX) 40 MG tablet Take 1 tablet (40 mg total) by mouth daily. 30 tablet 3   polyethylene glycol (MIRALAX / GLYCOLAX) 17 g packet Take 17 g by mouth daily.     Repotrectinib 40 MG CAPS Take 160 mg by mouth daily. (Patient taking differently: Take 160 mg by mouth 2 (two) times daily.) 56 capsule 0   rivaroxaban (XARELTO) 20 MG TABS tablet Take 1 tablet (20 mg total) by mouth daily with supper. 30 tablet 3   Turmeric (QC TUMERIC COMPLEX PO) Take 500 mg by mouth daily.     zinc sulfate 220 (50 Zn) MG capsule Take 220 mg by mouth daily.     Current Facility-Administered Medications  Medication Dose Route Frequency Provider Last Rate Last Admin   famotidine (PEPCID) tablet 20 mg  20 mg Oral Once Lane Hacker L, DO        VITAL SIGNS: There were no vitals taken for this visit. There were no vitals filed for this visit.  Estimated body mass index is 22.81 kg/m as calculated from the following:   Height as of 12/13/22: '5\' 6"'$  (1.676 m).   Weight as of an earlier encounter on 12/26/22: 141 lb 4.8 oz (64.1 kg).  LABS: CBC:    Component Value Date/Time   WBC 6.2 12/26/2022 0858   WBC 10.2 11/28/2022 0108   HGB 9.0 (L) 12/26/2022 0858   HGB 12.6 11/24/2020 1106   HCT 27.5 (L) 12/26/2022 0858   HCT 38.0 11/24/2020 1106   PLT 586 (H) 12/26/2022 0858   PLT  314 11/24/2020 1106   MCV 89.9 12/26/2022 0858   MCV 92 11/24/2020 1106   NEUTROABS 4.3 12/26/2022 0858   NEUTROABS 3.2 11/24/2020 1106   LYMPHSABS 1.5 12/26/2022 0858   LYMPHSABS 2.7 11/24/2020 1106   MONOABS 0.3 12/26/2022 0858   EOSABS 0.1 12/26/2022 0858   EOSABS 0.2 11/24/2020 1106   BASOSABS 0.0 12/26/2022 0858   BASOSABS 0.0 11/24/2020 1106   Comprehensive Metabolic Panel:    Component Value Date/Time   NA 142 12/26/2022 0858   NA 140 11/24/2020 1106   K 3.6 12/26/2022 0858   CL 107 12/26/2022 0858   CO2 29 12/26/2022 0858   BUN 12 12/26/2022 0858   BUN 7  11/24/2020 1106   CREATININE 0.67 12/26/2022 0858   GLUCOSE 74 12/26/2022 0858   CALCIUM 9.2 12/26/2022 0858   AST 16 12/26/2022 0858   ALT 11 12/26/2022 0858   ALKPHOS 84 12/26/2022 0858   BILITOT 0.4 12/26/2022 0858   PROT 7.4 12/26/2022 0858   PROT 7.7 11/24/2020 1106   ALBUMIN 4.4 12/26/2022 0858   ALBUMIN 5.0 (H) 11/24/2020 1106    RADIOGRAPHIC STUDIES: DG Chest 2 View  Result Date: 12/13/2022 CLINICAL DATA:  Lung cancer, PleurX catheter, malignant pleural effusion EXAM: CHEST - 2 VIEW COMPARISON:  11/26/2022, 11/28/2022 FINDINGS: Improvement in the left effusion following basilar PleurX catheter placement. Residual airspace disease and volume loss in the left lower lung. Otherwise stable lung aeration. No pneumothorax. Stable heart size and vascularity. Trachea midline. No acute osseous finding. IMPRESSION: 1. Improved left effusion following PleurX catheter placement. 2. Residual left lower lung airspace disease/atelectasis. Electronically Signed   By: Jerilynn Mages.  Shick M.D.   On: 12/13/2022 15:11   CT ANGIO CHEST AORTA W/ & OR WO/CM & GATING ([facility] ONLY)  Result Date: 11/28/2022 CLINICAL DATA:  Thrombosis of lower extremities. Evaluate source. Known metastatic left lower lobe lung cancer EXAM: CT ANGIOGRAPHY CHEST WITHOUT AND WITH CONTRAST TECHNIQUE: Multidetector CT imaging of the chest was performed using the  standard protocol before and after bolus administration of intravenous contrast. Multiplanar CT image reconstructions and MIPs were obtained to evaluate the vascular anatomy. RADIATION DOSE REDUCTION: This exam was performed according to the departmental dose-optimization program which includes automated exposure control, adjustment of the mA and/or kV according to patient size and/or use of iterative reconstruction technique. CONTRAST:  55m OMNIPAQUE IOHEXOL 350 MG/ML SOLN COMPARISON:  11/26/2022, 11/15/2022 FINDINGS: Cardiovascular: The heart is mildly enlarged, with prominent biventricular dilatation. There is adherent thrombus within the left atrium seen between the ostia of the left pulmonary veins, measuring 1.3 x 0.8 cm. This is the likely source for lower extremity emboli. The thoracic aorta is unremarkable without aneurysm or dissection. Opacification of the pulmonary vasculature is suboptimal due to timing of contrast bolus. There is no large central pulmonary embolus. Mediastinum/Nodes: Diffuse lymphadenopathy throughout the mediastinum and left hilar regions consistent with the metabolically active adenopathy on recent PET scan. No significant change. Thyroid, trachea, and esophagus are grossly unremarkable. Lungs/Pleura: Masslike left lower lobe consolidation seen on prior exams is again identified, consistent with known history of left lower lobe adenocarcinoma. The circumferential left pleural thickening seen on prior exams is again identified. Decreased left pleural fluid with indwelling left pleural drain. Stable nodularity within the right middle and right lower lobe consistent with contralateral pulmonary metastases. There are no new areas of consolidation within either lung. No pneumothorax. Central airways are patent. Upper Abdomen: There is a small area of decreased cortical enhancement within the upper pole right kidney, incompletely imaged due to slice selection. Renal infarct cannot be  excluded in light of the left atrial thrombus described above and history of lower extremity thromboembolic disease. No other acute upper abdominal findings. Musculoskeletal: No acute or destructive bony lesions. Reconstructed images demonstrate no additional findings. Review of the MIP images confirms the above findings. IMPRESSION: 1. Thrombus within the lateral aspect of the left atrium between the left pulmonary vein ostia. 2. Stable findings of metastatic lung cancer, with primary left lower lobe masslike consolidation, left-sided pleural thickening, mediastinal and hilar adenopathy, and right pulmonary nodules as seen on recent PET scan. 3. Decreased left pleural effusion with indwelling left pleural drain.  4. Possible cortical infarct upper pole right kidney, incompletely evaluated on this study. Critical Value/emergent results were called by telephone at the time of interpretation on 11/28/2022 at 8:29 pm to provider DR ROBBINS, who verbally acknowledged these results. Electronically Signed   By: Randa Ngo M.D.   On: 11/28/2022 20:40   PERIPHERAL VASCULAR CATHETERIZATION  Result Date: 11/27/2022 DATE OF SERVICE: 11/27/2022  PATIENT:  Frances Tully  37 y.o. female  PRE-OPERATIVE DIAGNOSIS: subacute thrombosis of left profunda and popliteal arteries causing ischemic rest pain  POST-OPERATIVE DIAGNOSIS:  Same  PROCEDURE:  1) Ultrasound guided right common femoral artery access 2) Aortogram 3) left lower extremity angiogram with third order cannulation 4) mechanical thrombectomy of left profunda femoris artery (8 [place] JETi) 5) mechanical thrombectomy of left popliteal artery  (6 [place] JETi) 6) laser atherectomy and angioplasty (3 x 271m) of left anterior tibial artery 7) Conscious sedation (129 minutes)   SURGEON:  TYevonne Aline HStanford Breed MD  ASSISTANT: none  ANESTHESIA:   local and IV sedation  ESTIMATED BLOOD LOSS: 7013m LOCAL MEDICATIONS USED:  LIDOCAINE  COUNTS: confirmed correct.  PATIENT DISPOSITION:   PACU - hemodynamically stable.  Delay start of Pharmacological VTE agent (>24hrs) due to surgical blood loss or risk of bleeding: no  INDICATION FOR PROCEDURE: RoBelynda Doses a 89.o. female with occlusion of left profunda femoris and popliteal arteries causing ischemic rest pain.  I felt this was likely subacute given the weeks long onset of symptoms.  Preoperative duplex suggested left profunda femoris and popliteal artery occlusions. After careful discussion of risks, benefits, and alternatives the patient was offered angiography with possible intervention. The patient understood and wished to proceed.  OPERATIVE FINDINGS: Terminal aorta and iliac arteries: Widely patent without flow-limiting stenosis  Left lower extremity: Common femoral artery: Healthy and widely patent Profunda femoris artery: Occluded at its origin.  Reconstitution at second-order branching in the mid thigh. Superficial femoral artery: Healthy and widely patent Popliteal artery: Above-knee segment healthy and widely patent.  Artery abruptly occludes behind the knee.  Robust collateralization around the popliteal occlusion suggesting chronicity to the occlusion.  Anterior tibial artery: Reconstitutes several centimeters distal to its origin from geniculate collaterals Tibioperoneal trunk: Occluded Peroneal artery:  Reconstitutes several centimeters distal to its origin from geniculate collaterals Posterior tibial artery:  Reconstitutes several centimeters distal to its origin from geniculate collaterals. Pedal circulation: Fills via collaterals  DESCRIPTION OF PROCEDURE: After identification of the patient in the pre-operative holding area, the patient was transferred to the operating room. The patient was positioned supine on the operating room table.  Anesthesia was induced. The groins was prepped and draped in standard fashion. A surgical pause was performed confirming correct patient, procedure, and operative location.  The right groin  was anesthetized with subcutaneous injection of 1% lidocaine. Using ultrasound guidance, the right common femoral artery was accessed with micropuncture technique. Fluoroscopy was used to confirm cannulation over the femoral head. The 582F sheath was upsized to 82F.  A Benson wire was advanced into the distal aorta. Over the wire an omni flush catheter was advanced to the level of L2. Aortogram was performed - see above for details.  The left common iliac artery was selected with an omniflush catheter and Glidewire advantage guidewire. The wire was advanced into the common femoral artery. Over the wire the omni flush catheter was advanced into the external iliac artery. Selective angiography was performed - see above for details.  The decision was made to intervene.  The patient was heparinized with 7000 units of heparin. The 52F sheath was exchanged for a 8 [place] by 45 cm sheath. Selective angiography of the left lower extremity was performed prior to intervention.  The lesions were treated with: mechanical thrombectomy of left profunda femoris artery (8 [place] JETi) mechanical thrombectomy of left popliteal artery  (6 [place] JETi) laser atherectomy and angioplasty (3 x 29m) of left anterior tibial artery  Completion angiography revealed: Profunda femoris thrombosis resolved Popliteal artery occlusion recanalized with combination of thrombectomy, angioplasty, and arthrectomy. Not able to reestablish inline flow because of significant thrombus in the ankle and foot. The foot does fill in some of the native vessels do opacify with contrast.   A Perclose device was used to close the arteriotomy. Hemostasis was excellent upon completion.  Conscious sedation was administered with the use of IV fentanyl and midazolam under continuous physician and nurse monitoring.  Heart rate, blood pressure, and oxygen saturation were continuously monitored.  Total sedation time was 129 minutes  Upon completion of the case instrument and  sharps counts were confirmed correct. The patient was transferred to the PACU in good condition. I was present for all portions of the procedure.  PLAN: Resume heparin.  Monitor for clinical improvement.  Will reevaluate in the morning.  If foot pain persists, will proceed to the OR for operative thrombectomy.  TYevonne Aline HStanford Breed MD FPeachtree [facility] At [facility]ris[facility] Memorial HospitalPhone Number: ([phone]/04/2023 1:11 PM    ECHOCARDIOGRAM COMPLETE  Result Date: 11/27/2022    ECHOCARDIOGRAM REPORT   Patient Name:   RZAYANNA YECKLEYDate of Exam: 11/27/2022 Medical Rec #:  [id number]   Height:       66.0 in Accession #:    [id number]  Weight:       130.1 lb Date of Birth:  [birth year]    BSA:          1.666 m Patient Age:    89years     BP:           125/74 mmHg Patient Gender: F            HR:           110 bpm. Exam Location:  Inpatient Procedure: 2D Echo, Cardiac Doppler and Color Doppler Indications:    PAD  History:        Patient has no prior history of Echocardiogram examinations.                 Lung cancer.  Sonographer:    AClayton LefortReferring Phys: [id number] 1. Left ventricular ejection fraction, by estimation, is 50 to 55%. The left ventricle has low normal function. The left ventricle has no regional wall motion abnormalities. Left ventricular diastolic parameters were normal.  2. Right ventricular systolic function is normal. The right ventricular size is normal.  3. The mitral valve is normal in structure. No evidence of mitral valve regurgitation. No evidence of mitral stenosis.  4. The aortic valve was not well visualized. Aortic valve regurgitation is not visualized. No aortic stenosis is present.  5. The inferior vena cava is normal in size with greater than 50% respiratory variability, suggesting right atrial pressure of 3 mmHg. FINDINGS  Left Ventricle: Left ventricular ejection fraction, by estimation, is 50 to 55%. The left ventricle has low normal function.  The left ventricle has no regional wall motion abnormalities. The left ventricular internal cavity size  was normal in size. There is no left ventricular hypertrophy. Left ventricular diastolic parameters were normal. Right Ventricle: The right ventricular size is normal. No increase in right ventricular wall thickness. Right ventricular systolic function is normal. Left Atrium: Left atrial size was normal in size. Right Atrium: Right atrial size was normal in size. Pericardium: There is no evidence of pericardial effusion. Mitral Valve: The mitral valve is normal in structure. No evidence of mitral valve regurgitation. No evidence of mitral valve stenosis. Tricuspid Valve: The tricuspid valve is normal in structure. Tricuspid valve regurgitation is trivial. Aortic Valve: The aortic valve was not well visualized. Aortic valve regurgitation is not visualized. No aortic stenosis is present. Aortic valve mean gradient measures 3.0 mmHg. Aortic valve peak gradient measures 4.8 mmHg. Aortic valve area, by VTI measures 2.83 cm. Pulmonic Valve: The pulmonic valve was not well visualized. Pulmonic valve regurgitation is trivial. Aorta: The aortic root and ascending aorta are structurally normal, with no evidence of dilitation. Venous: The inferior vena cava is normal in size with greater than 50% respiratory variability, suggesting right atrial pressure of 3 mmHg. IAS/Shunts: The interatrial septum was not well visualized.  LEFT VENTRICLE PLAX 2D LVIDd:         4.40 cm LVIDs:         3.20 cm LV PW:         0.90 cm LV IVS:        0.70 cm LVOT diam:     2.10 cm LV SV:         50 LV SV Index:   30 LVOT Area:     3.46 cm  RIGHT VENTRICLE             IVC RV Basal diam:  2.40 cm     IVC diam: 1.20 cm RV S prime:     12.10 cm/s TAPSE (M-mode): 2.2 cm LEFT ATRIUM             Index        RIGHT ATRIUM          Index LA diam:        2.20 cm 1.32 cm/m   RA Area:     7.89 cm LA Vol (A2C):   28.1 ml 16.87 ml/m  RA Volume:   13.40 ml  8.05 ml/m LA Vol (A4C):   30.6 ml 18.37 ml/m LA Biplane Vol: 31.8 ml 19.09 ml/m  AORTIC VALVE AV Area (Vmax):    2.76 cm AV Area (Vmean):   2.74 cm AV Area (VTI):     2.83 cm AV Vmax:           110.00 cm/s AV Vmean:          82.300 cm/s AV VTI:            0.175 m AV Peak Grad:      4.8 mmHg AV Mean Grad:      3.0 mmHg LVOT Vmax:         87.70 cm/s LVOT Vmean:        65.200 cm/s LVOT VTI:          0.143 m LVOT/AV VTI ratio: 0.82  AORTA Ao Root diam: 3.30 cm Ao Asc diam:  2.70 cm  SHUNTS Systemic VTI:  0.14 m Systemic Diam: 2.10 cm Oswaldo Milian MD Electronically signed by Oswaldo Milian MD Signature Date/Time: 11/27/2022/6:00:10 PM    Final    DG Chest 1 View  Result Date: 11/26/2022 CLINICAL DATA:  Follow-up pleural effusion EXAM: CHEST  1 VIEW COMPARISON:  11/15/2022 FINDINGS: Right chest remains clear. Pleural catheter remains in place at the inferior pleural space on the left. There is a persistent pleural effusion on the left, moderate in size, with atelectasis of the left lower lung. IMPRESSION: Persistent moderate left effusion with left lower lung atelectasis. Pleural catheter remains in place at the inferior left pleural space. Electronically Signed   By: Nelson Chimes M.D.   On: 11/26/2022 20:59    PERFORMANCE STATUS (ECOG) : 1 - Symptomatic but completely ambulatory  Review of Systems  Constitutional:  Positive for fatigue.  Musculoskeletal:  Positive for arthralgias.  Neurological:  Positive for numbness.  Unless otherwise noted, a complete review of systems is negative.  Physical Exam General: NAD.  Ambulatory Cardiovascular: regular rate and rhythm Pulmonary: clear ant fields Abdomen: soft, nontender, + bowel sounds Extremities: no edema, no joint deformities Skin: no rashes Neurological: AAOx4, mood appropriate  IMPRESSION: Mrs. Thurmond was initially seen by our palliative team (Dr. Hilma Favors) during previous hospitalizations.  This is her initial visit at the [facility].  No family present.  Patient is ambulatory without assistive devices.  Alert and able to engage appropriately in discussions.   I introduced myself, [place] RN, and Palliative's role in collaboration with the oncology team. Concept of Palliative Care was introduced as specialized medical care for people and their families living with serious illness.  It focuses on providing relief from the symptoms and stress of a serious illness.  The goal is to improve quality of life for both the patient and the family. Values and goals of care important to patient and family were attempted to be elicited.   Mrs. Calix lives in the home with her husband of more than 9 years in their 13-monthold son.  She is currently on leave from work at this time due to her cancer diagnosis.  Appetite is good.  She is appreciative of increasing her activity levels and decreasing fatigue.  Tolerating treatment without difficulty.  Speaks to ability to be able to care for her son as this was difficult for her several months ago.  Her pain is much improved since starting treatment and other interventions.  Has not taking any of her as needed medications for pain in over a week.  She would like to wean herself down off of gabapentin.  Currently taking 200 mg at bedtime.  Education provided on decreasing dose to 100 mg for the next 7 days then every other day.  In 14 days patient can discontinue medication if no side effects or pain does not really intensify.  She verbalized understanding and appreciation.  We discussed her current illness and what it means in the larger context of her on-going co-morbidities. Natural disease trajectory and expectations were discussed.  RJahiais realistic in her understanding of current illness.  She is clear and expressed wishes to continue to treat the treatable aggressively allow her every opportunity to continue to thrive for her family.  Is appreciative of her current quality  of life.  I discussed the importance of continued conversation with family and their medical providers regarding overall plan of care and treatment options, ensuring decisions are within the context of the patients values and GOCs.  PLAN: Established therapeutic relationship. Education provided on palliative's role in collaboration with their Oncology/Radiation team. Ongoing goals of care and symptom management support Decrease gabapentin to 100 mg at bedtime x 7 days then gabapentin  100 mg every other day.  Discontinue after day 14. I will plan to see patient back in 4-6 weeks in collaboration to other oncology appointments.    Patient expressed understanding and was in agreement with this plan. She also understands that She can call the clinic at any time with any questions, concerns, or complaints.   Thank you for your referral and allowing Palliative to assist in Mrs. Althea Lecomte's care.   Number and complexity of problems addressed: HIGH - 1 or more chronic illnesses with SEVERE exacerbation, progression, or side effects of treatment - advanced cancer, pain. Any controlled substances utilized were prescribed in the context of palliative care.  Time Total: 50 min   Visit consisted of counseling and education dealing with the complex and emotionally intense issues of symptom management and palliative care in the setting of serious and potentially life-threatening illness.Greater than 50%  of this time was spent counseling and coordinating care related to the above assessment and plan.  Signed by: Alda Lea, AGPCNP-BC Palliative Medicine Team/[place]   *Please note that this is a verbal dictation therefore any spelling or grammatical errors are due to the "[place] [employer]" system interpretation.

## 2022-12-26 NOTE — Telephone Encounter (Signed)
Oral Chemotherapy Pharmacist Encounter   Received fax from [place] requesting additional form be sent in with that appeal documentation, and for it to be faxed to [phone] as well as [phone].  Signed form sent to both the above faxes as well with supporting appeal information that was originally submitted.   Oral chemotherapy clinic will continue to follow.   Leron Croak, PharmD, BCPS, BCOP Hematology/Oncology Clinical Pharmacist [facility] [place] [phone] 12/26/2022 9:00 AM

## 2022-12-26 NOTE — Patient Instructions (Signed)
-   to stop the gabapentin start taking your regular dose once a day for 1 week, then the next week take half of a dose (1 capsule) for 1 week, then 1 capsule every other day for 1 week then you can stop taking it.  - you can look into [facility] for classes, it is on the [address]

## 2022-12-27 ENCOUNTER — Encounter: Payer: Self-pay | Admitting: General Practice

## 2022-12-27 ENCOUNTER — Ambulatory Visit: Payer: BC Managed Care – PPO | Admitting: Pulmonary Disease

## 2022-12-27 ENCOUNTER — Encounter: Payer: Self-pay | Admitting: Nurse Practitioner

## 2022-12-27 LAB — CARDIOLIPIN ANTIBODIES, IGG, IGM, IGA
Anticardiolipin IgA: <9 APL U/mL (ref 0–11)
Anticardiolipin IgG: <9 GPL U/mL (ref 0–14)
Anticardiolipin IgM: <9 MPL U/mL (ref 0–12)

## 2022-12-27 LAB — PROTEIN S ACTIVITY: Protein S Activity: 81 % (ref 63–140)

## 2022-12-27 LAB — DRVVT MIX: dRVVT Mix: 46 s — ABNORMAL HIGH (ref 0.0–40.4)

## 2022-12-27 LAB — BETA-2-GLYCOPROTEIN I ABS, IGG/M/A
Beta-2 Glyco I IgG: <9 GPI IgG units (ref 0–20)
Beta-2-Glycoprotein I IgA: <9 GPI IgA units (ref 0–25)
Beta-2-Glycoprotein I IgM: <9 GPI IgM units (ref 0–32)

## 2022-12-27 LAB — PROTEIN C ACTIVITY: Protein C Activity: 111 % (ref 73–180)

## 2022-12-27 LAB — LUPUS ANTICOAGULANT PANEL
DRVVT: 50.5 s — ABNORMAL HIGH (ref 0.0–47.0)
PTT Lupus Anticoagulant: 43.2 s (ref 0.0–43.5)

## 2022-12-27 LAB — PROTEIN S, TOTAL: Protein S Ag, Total: 104 % (ref 60–150)

## 2022-12-27 LAB — DRVVT CONFIRM: dRVVT Confirm: 1.2 ratio (ref 0.8–1.2)

## 2022-12-27 NOTE — Progress Notes (Signed)
[place] Spiritual Care Note  Received email inquiry from [place], who is interested in joining [place]. Added her to email list and responded with more information and details about other [place] support programming as well. Ensured that she is also aware of ongoing Spiritual Care availability for 1:1 support.   [place], [place], [facility] Pager [phone] Voicemail [phone]

## 2022-12-28 LAB — THYROID PANEL WITH TSH
Free Thyroxine Index: 1.7 (ref 1.2–4.9)
T3 Uptake Ratio: 25 % (ref 24–39)
T4, Total: 6.9 ug/dL (ref 4.5–12.0)
TSH: 1.13 u[IU]/mL (ref 0.450–4.500)

## 2022-12-28 LAB — HOMOCYSTEINE: Homocysteine: 11.1 umol/L (ref 0.0–14.5)

## 2022-12-28 LAB — PROTEIN C, TOTAL: Protein C, Total: 81 % (ref 60–150)

## 2022-12-30 ENCOUNTER — Other Ambulatory Visit (HOSPITAL_COMMUNITY): Payer: Self-pay

## 2022-12-30 NOTE — Telephone Encounter (Signed)
Oral Oncology Patient Advocate Encounter  Called [employer] to check status of second level appeal for Augtyro through [place].   Augtyro was ineligible for a second level appeal. Letter of ineligibility received via fax.   [place] department and requested a letter of denial for the first level appeal. Representative, Stanton Kidney, stated the office should receive a fax of this denial by the end of the day.   I have drafted a letter of clarification to be sent in to [facility] Access Support and [employer] along with these denial letters. The letter clarifies that the patient's correct information for pharmacy benefits is through [employer] and the billing information is included.  All of these documents will be faxed to the program at [phone] once the final letter is received.  [name], CPhT-Adv Oncology Pharmacy Patient [place] Direct Number: [phone]  Fax: [phone]

## 2022-12-31 ENCOUNTER — Ambulatory Visit: Payer: BC Managed Care – PPO | Admitting: Behavioral Health

## 2022-12-31 DIAGNOSIS — F411 Generalized anxiety disorder: Secondary | ICD-10-CM | POA: Diagnosis not present

## 2023-01-01 ENCOUNTER — Other Ambulatory Visit: Payer: Self-pay | Admitting: Internal Medicine

## 2023-01-01 ENCOUNTER — Telehealth: Payer: Self-pay | Admitting: Medical Oncology

## 2023-01-01 ENCOUNTER — Ambulatory Visit (HOSPITAL_COMMUNITY)
Admission: RE | Admit: 2023-01-01 | Discharge: 2023-01-01 | Disposition: A | Discharge status: Home / Self Care | Payer: BC Managed Care – PPO | Source: Ambulatory Visit | Attending: Internal Medicine | Admitting: Internal Medicine

## 2023-01-01 DIAGNOSIS — C3492 Malignant neoplasm of unspecified part of left bronchus or lung: Secondary | ICD-10-CM | POA: Diagnosis not present

## 2023-01-01 DIAGNOSIS — D72829 Elevated white blood cell count, unspecified: Secondary | ICD-10-CM | POA: Diagnosis not present

## 2023-01-01 DIAGNOSIS — Z9181 History of falling: Secondary | ICD-10-CM | POA: Diagnosis not present

## 2023-01-01 DIAGNOSIS — Z4801 Encounter for change or removal of surgical wound dressing: Secondary | ICD-10-CM | POA: Diagnosis not present

## 2023-01-01 DIAGNOSIS — Z8701 Personal history of pneumonia (recurrent): Secondary | ICD-10-CM | POA: Diagnosis not present

## 2023-01-01 DIAGNOSIS — J910 Malignant pleural effusion: Secondary | ICD-10-CM | POA: Diagnosis not present

## 2023-01-01 DIAGNOSIS — F419 Anxiety disorder, unspecified: Secondary | ICD-10-CM | POA: Diagnosis not present

## 2023-01-01 DIAGNOSIS — R0602 Shortness of breath: Secondary | ICD-10-CM | POA: Diagnosis not present

## 2023-01-01 DIAGNOSIS — D630 Anemia in neoplastic disease: Secondary | ICD-10-CM | POA: Diagnosis not present

## 2023-01-01 DIAGNOSIS — Z48813 Encounter for surgical aftercare following surgery on the respiratory system: Secondary | ICD-10-CM | POA: Diagnosis not present

## 2023-01-01 DIAGNOSIS — Z7951 Long term (current) use of inhaled steroids: Secondary | ICD-10-CM | POA: Diagnosis not present

## 2023-01-01 MED ORDER — IOHEXOL 350 MG/ML SOLN
80.0000 mL | Freq: Once | INTRAVENOUS | Status: AC | PRN
  Administered 2023-01-01: 80 mL via INTRAVENOUS

## 2023-01-01 NOTE — Telephone Encounter (Addendum)
Per Dr Julien Nordmann , he is going to order CT Angio of chest. Pt notified.

## 2023-01-01 NOTE — Telephone Encounter (Signed)
Describes  marked change in symptoms since Sunday. More tired, increased SOB-and progressing daily .  These are very similar but  not as bad as how she felt when she presented to ED before dx. Even when sitting she feels " winded".  Pain in  LUQ  below rib. Pain does not change with deep breath , intermittent increased fatigue. " Oxygen sats in high 90s" .   New L shin pain , "  I may have banged it . I have bruises all over my body probably from playing with my son.

## 2023-01-02 ENCOUNTER — Telehealth: Payer: Self-pay | Admitting: Medical Oncology

## 2023-01-02 ENCOUNTER — Other Ambulatory Visit: Payer: Self-pay | Admitting: Internal Medicine

## 2023-01-02 ENCOUNTER — Encounter: Payer: Self-pay | Admitting: Medical Oncology

## 2023-01-02 MED ORDER — AMOXICILLIN-POT CLAVULANATE 875-125 MG PO TABS: 1.0000 | ORAL_TABLET | Freq: Two times a day (BID) | ORAL | 0 refills | Status: DC

## 2023-01-02 NOTE — Telephone Encounter (Signed)
Pt aware of her imaging results from last night.  Mohamed sent antibiotic for possible pneumonia.

## 2023-01-03 ENCOUNTER — Telehealth: Payer: Self-pay | Admitting: Medical Oncology

## 2023-01-03 ENCOUNTER — Encounter: Payer: Self-pay | Admitting: Medical Oncology

## 2023-01-03 ENCOUNTER — Other Ambulatory Visit: Payer: Self-pay | Admitting: Medical Oncology

## 2023-01-03 ENCOUNTER — Inpatient Hospital Stay: Payer: BC Managed Care – PPO

## 2023-01-03 DIAGNOSIS — Z79899 Other long term (current) drug therapy: Secondary | ICD-10-CM | POA: Diagnosis not present

## 2023-01-03 DIAGNOSIS — Z793 Long term (current) use of hormonal contraceptives: Secondary | ICD-10-CM | POA: Diagnosis not present

## 2023-01-03 DIAGNOSIS — C3492 Malignant neoplasm of unspecified part of left bronchus or lung: Secondary | ICD-10-CM

## 2023-01-03 DIAGNOSIS — C7951 Secondary malignant neoplasm of bone: Secondary | ICD-10-CM | POA: Diagnosis not present

## 2023-01-03 DIAGNOSIS — Z7901 Long term (current) use of anticoagulants: Secondary | ICD-10-CM | POA: Diagnosis not present

## 2023-01-03 DIAGNOSIS — Z7982 Long term (current) use of aspirin: Secondary | ICD-10-CM | POA: Diagnosis not present

## 2023-01-03 DIAGNOSIS — Z7902 Long term (current) use of antithrombotics/antiplatelets: Secondary | ICD-10-CM | POA: Diagnosis not present

## 2023-01-03 DIAGNOSIS — D649 Anemia, unspecified: Secondary | ICD-10-CM | POA: Diagnosis not present

## 2023-01-03 DIAGNOSIS — F1290 Cannabis use, unspecified, uncomplicated: Secondary | ICD-10-CM | POA: Diagnosis not present

## 2023-01-03 LAB — CBC WITH DIFFERENTIAL (CANCER CENTER ONLY)
Abs Immature Granulocytes: 0.03 10*3/uL (ref 0.00–0.07)
Basophils Absolute: 0 10*3/uL (ref 0.0–0.1)
Basophils Relative: 0 %
Eosinophils Absolute: 0.2 10*3/uL (ref 0.0–0.5)
Eosinophils Relative: 2 %
HCT: 28.4 % — ABNORMAL LOW (ref 36.0–46.0)
Hemoglobin: 9.1 g/dL — ABNORMAL LOW (ref 12.0–15.0)
Immature Granulocytes: 0 %
Lymphocytes Relative: 24 %
Lymphs Abs: 2.1 10*3/uL (ref 0.7–4.0)
MCH: 29.1 pg (ref 26.0–34.0)
MCHC: 32 g/dL (ref 30.0–36.0)
MCV: 90.7 fL (ref 80.0–100.0)
Monocytes Absolute: 0.6 10*3/uL (ref 0.1–1.0)
Monocytes Relative: 7 %
Neutro Abs: 5.7 10*3/uL (ref 1.7–7.7)
Neutrophils Relative %: 67 %
Platelet Count: 788 10*3/uL — ABNORMAL HIGH (ref 150–400)
RBC: 3.13 MIL/uL — ABNORMAL LOW (ref 3.87–5.11)
RDW: 15.4 % (ref 11.5–15.5)
WBC Count: 8.6 10*3/uL (ref 4.0–10.5)
nRBC: 0 % (ref 0.0–0.2)

## 2023-01-03 LAB — SAMPLE TO BLOOD BANK

## 2023-01-03 NOTE — Telephone Encounter (Signed)
[facility] appt 03/21 with Dr Loletta Specter. He  is requesting all CT  images and reports. LVM at [facility] thoracic clinic to return my call with the mailing address to send the scans.  Per Dr. Julien Nordmann I communicated his message to [facility].   "Pneumonitis is always a possibility but usual more extensive and involves both lungs. I think this is just residual opacities from the treated lung masses in the left lung or mild pneumonia. Augmentin was just to be more proactive in case it is early pneumonia. You can hold Augmentin if you like and start if the symptoms are worse". Pt notified.

## 2023-01-04 LAB — THYROID PANEL WITH TSH
Free Thyroxine Index: 1.5 (ref 1.2–4.9)
T3 Uptake Ratio: 24 % (ref 24–39)
T4, Total: 6.2 ug/dL (ref 4.5–12.0)
TSH: 1.63 u[IU]/mL (ref 0.450–4.500)

## 2023-01-06 NOTE — Progress Notes (Unsigned)
VASCULAR AND VEIN SPECIALISTS OF [place]  ASSESSMENT / PLAN: 37 y.o. female with subacute bilateral lower extremity ischemia causing rest pain in the left lower extremity and claudication in the right lower extremity.  Successful endovascular thrombectomy 11/27/22 with resolution of rest pain.  Her claudication type symptoms persist.  I counseled her these will likely continue.  I encouraged her to walk is much as she can tolerate daily.  Once she has gotten to a stable place with her cancer therapy, we can consider further intervention.  Recommend continued "triple therapy" for anticoagulation.  We will likely taper this therapy as her cancer therapy progresses but would not do this at the present moment.  Will see her again in 3 months with repeat noninvasive testing.  CHIEF COMPLAINT: Severe leg pain  HISTORY OF PRESENT ILLNESS: Melissa Pineda is a 37 y.o. female who was urgently added into the clinic today for evaluation of ultrasound findings.  The patient has had an unusual course.  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  She is a never smoker.  She had malignant effusion and required a Pleurx catheter.  She reports worsening leg pain over the past several days.  Prior to this, she describes leg pain like shinsplints.  She reports severe pain about the metatarsals and toes on the left, classic for ischemic rest pain.  She reports cramping discomfort in the right leg, which is bearable to her.  01/06/23: Patient returns to clinic after intervention and beginning immunotherapy.  She is doing much better overall.  She looks much more healthy.  She does describe some cramping discomfort with walking and easy fatigability.  She does not describe rest pain.  Her symptoms are manageable for her.  Since CT scan of the chest shows good response to  immunotherapy.  VASCULAR SURGICAL HISTORY: none  VASCULAR RISK FACTORS: Negative history of stroke / transient ischemic attack. Negative history of coronary artery disease.  Negative history of diabetes mellitus.  Negative history of smoking. Negative history of hypertension.  Negative history of chronic kidney disease.  Negative history of chronic obstructive pulmonary disease.  FUNCTIONAL STATUS: ECOG performance status: (0) Fully active, able to carry on all predisease performance without restriction Ambulatory status: Ambulatory within the community without limits  Past Medical History:  Diagnosis Date   Anxiety    Phreesia 11/21/2020   Cancer [facility])     Past Surgical History:  Procedure Laterality Date   ABDOMINAL AORTOGRAM W/LOWER EXTREMITY N/A 11/27/2022   Procedure: ABDOMINAL AORTOGRAM W/LOWER EXTREMITY;  Surgeon: Cherre Robins, MD;  Location: [place] CV LAB;  Service: Cardiovascular;  Laterality: N/A;   IR PERC PLEURAL DRAIN W/INDWELL CATH W/IMG GUIDE  11/09/2022   PERIPHERAL VASCULAR ATHERECTOMY  11/27/2022   Procedure: PERIPHERAL VASCULAR ATHERECTOMY;  Surgeon: Cherre Robins, MD;  Location: [place] CV LAB;  Service: Cardiovascular;;   PERIPHERAL VASCULAR THROMBECTOMY  11/27/2022   Procedure: PERIPHERAL VASCULAR THROMBECTOMY;  Surgeon: Cherre Robins, MD;  Location: [place] CV LAB;  Service: Cardiovascular;;   REMOVAL OF PLEURAL DRAINAGE CATHETER N/A 12/17/2022   Procedure: MINOR REMOVAL OF PLEURAL DRAINAGE CATHETER;  Surgeon: Garner Nash, DO;  Location: [place];  Service: Cardiopulmonary;  Laterality: N/A;   WISDOM TOOTH EXTRACTION      Family History  Problem Relation Age of Onset   Cancer Mother    Cancer  Maternal Grandfather    Cancer Maternal Grandmother    Cancer Paternal Grandfather    Hypertension Paternal Grandmother     Social History   Socioeconomic History   Marital status: Married    Spouse name: Not on file   Number of  children: Not on file   Years of education: Not on file   Highest education level: Not on file  Occupational History   Not on file  Tobacco Use   Smoking status: Never   Smokeless tobacco: Never  Vaping Use   Vaping Use: Never used  Substance and Sexual Activity   Alcohol use: Yes    Alcohol/week: 7.0 standard drinks of alcohol    Types: 7 Glasses of wine per week    Comment: 1 glass wine in the evening    Drug use: Yes    Types: Marijuana    Comment: occasional   Sexual activity: Yes    Birth control/protection: I.U.D.  Other Topics Concern   Not on file  Social History Narrative   Not on file   Social Determinants of Health   Financial Resource Strain: Not on file  Food Insecurity: No Food Insecurity (11/26/2022)   Hunger Vital Sign    Worried About Running Out of Food in the Last Year: Never true    Ran Out of Food in the Last Year: Never true  Transportation Needs: No Transportation Needs (11/26/2022)   PRAPARE - Hydrologist (Medical): No    Lack of Transportation (Non-Medical): No  Physical Activity: Not on file  Stress: Not on file  Social Connections: Not on file  Intimate Partner Violence: Not At Risk (11/26/2022)   Humiliation, Afraid, Rape, and Kick questionnaire    Fear of Current or Ex-Partner: No    Emotionally Abused: No    Physically Abused: No    Sexually Abused: No    Allergies  Allergen Reactions   Hydrocodone Nausea Only    Dizzy, "crawl out of my skin"   Tramadol Other (See Comments)    "Weird feeling"    Current Outpatient Medications  Medication Sig Dispense Refill   amoxicillin-clavulanate (AUGMENTIN) 875-125 MG tablet Take 1 tablet by mouth 2 (two) times daily. 14 tablet 0   acetaminophen (TYLENOL) 325 MG tablet Take 650 mg by mouth every 6 (six) hours as needed for mild pain (for mild pain.).     ALPRAZolam (XANAX) 0.5 MG tablet Take 1 tablet (0.5 mg total) by mouth 3 (three) times daily as needed for anxiety.  (Patient taking differently: Take 0.5 mg by mouth 2 (two) times daily as needed for anxiety.) 60 tablet 0   aspirin EC 81 MG tablet Take 1 tablet (81 mg total) by mouth daily. Swallow whole. 30 tablet 12   Cholecalciferol (VITAMIN D-3) 125 MCG (5000 UT) TABS Take 1 tablet by mouth daily.     clopidogrel (PLAVIX) 75 MG tablet Take 1 tablet (75 mg total) by mouth daily. 30 tablet 11   Ferrous Sulfate (IRON HIGH-POTENCY) 142 (45 Fe) MG TBCR Take 142 mg by mouth daily.     gabapentin (NEURONTIN) 100 MG capsule Take 200 mg by mouth 2 (two) times daily.     levonorgestrel (MIRENA, 52 MG,) 20 MCG/DAY IUD 1 each by Intrauterine route once.     Magnesium Oxide -Mg Supplement 400 MG CAPS Take 400 mg by mouth 2 (two) times daily. 60 capsule 0   metoCLOPramide (REGLAN) 5 MG tablet Take 1 tablet (5 mg  total) by mouth every 6 (six) hours as needed for nausea. 30 tablet 1   ondansetron (ZOFRAN) 8 MG tablet Take 1 tablet (8 mg total) by mouth every 8 (eight) hours as needed for nausea or vomiting. 20 tablet 0   ondansetron (ZOFRAN-ODT) 8 MG disintegrating tablet Take 1 tablet (8 mg total) by mouth every 8 (eight) hours as needed for nausea or vomiting. 20 tablet 1   Ondansetron 4 MG FILM Take 4-8 mg by mouth every 6 (six) hours as needed. 15 each 0   OVER THE COUNTER MEDICATION Take 2 tablets by mouth daily. Beet root     oxyCODONE (ROXICODONE) 5 MG immediate release tablet Take 1 tablet (5 mg total) by mouth every 4 (four) hours as needed for severe pain. (Patient taking differently: Take 5 mg by mouth every 6 (six) hours as needed for severe pain.) 120 tablet 0   pantoprazole (PROTONIX) 40 MG tablet Take 1 tablet (40 mg total) by mouth daily. 30 tablet 3   polyethylene glycol (MIRALAX / GLYCOLAX) 17 g packet Take 17 g by mouth daily.     Repotrectinib 40 MG CAPS Take 160 mg by mouth daily. (Patient taking differently: Take 160 mg by mouth 2 (two) times daily.) 56 capsule 0   rivaroxaban (XARELTO) 20 MG TABS  tablet Take 1 tablet (20 mg total) by mouth daily with supper. 30 tablet 3   Turmeric (QC TUMERIC COMPLEX PO) Take 500 mg by mouth daily.     zinc sulfate 220 (50 Zn) MG capsule Take 220 mg by mouth daily.     Current Facility-Administered Medications  Medication Dose Route Frequency Provider Last Rate Last Admin   famotidine (PEPCID) tablet 20 mg  20 mg Oral Once Lane Hacker L, DO        PHYSICAL EXAM There were no vitals filed for this visit.  Young woman.  Anxious. Mild tachycardia Regular rate and rhythm Left foot cool.  Right foot cool No palpable pedal pulses.    PERTINENT LABORATORY AND RADIOLOGIC DATA  Most recent CBC    Latest Ref Rng & Units 01/03/2023   12:03 PM 12/26/2022    8:58 AM 11/28/2022    1:08 AM  CBC  WBC 4.0 - 10.5 K/uL 8.6  6.2  10.2   Hemoglobin 12.0 - 15.0 g/dL 9.1  9.0  9.1   Hematocrit 36.0 - 46.0 % 28.4  27.5  26.2   Platelets 150 - 400 K/uL 788  586  312      Most recent CMP    Latest Ref Rng & Units 12/26/2022    8:58 AM 11/26/2022    8:24 PM 11/26/2022   11:58 AM  CMP  Glucose 70 - 99 mg/dL 74  99  89   BUN 6 - 20 mg/dL 12  16  10    Creatinine 0.44 - 1.00 mg/dL 0.67  0.82  0.73   Sodium 135 - 145 mmol/L 142  132  135   Potassium 3.5 - 5.1 mmol/L 3.6  4.0  4.3   Chloride 98 - 111 mmol/L 107  99  100   CO2 22 - 32 mmol/L 29  25  30    Calcium 8.9 - 10.3 mg/dL 9.2  8.2  8.6   Total Protein 6.5 - 8.1 g/dL 7.4  5.3  5.7   Total Bilirubin 0.3 - 1.2 mg/dL 0.4  0.2  0.4   Alkaline Phos 38 - 126 U/L 84  65  71   AST  15 - 41 U/L 16  27  21    ALT 0 - 44 U/L 11  35  30     Renal function Estimated Creatinine Clearance: 91 mL/min (by C-G formula based on SCr of 0.67 mg/dL).  Hgb A1c MFr Bld (%)  Date Value  11/24/2020 5.0    LDL Chol Calc (NIH)  Date Value Ref Range Status  11/24/2020 89 0 - 99 mg/dL Final   LDL Cholesterol  Date Value Ref Range Status  11/28/2022 104 (H) 0 - 99 mg/dL Final    Comment:           Total  Cholesterol/HDL:CHD Risk Coronary Heart Disease Risk Table                     Men   Women  1/2 Average Risk   3.4   3.3  Average Risk       5.0   4.4  2 X Average Risk   9.6   7.1  3 X Average Risk  23.4   11.0        Use the calculated Patient Ratio above and the CHD Risk Table to determine the patient's CHD Risk.        ATP III CLASSIFICATION (LDL):  <100     mg/dL   Optimal  100-129  mg/dL   Near or Above                    Optimal  130-159  mg/dL   Borderline  160-189  mg/dL   High  >190     mg/dL   Very High Performed at [facility] [address], [place], [place] [address]      Vascular Imaging:   +-------+-----------+-----------+------------+------------+  ABI/TBIToday's ABIToday's TBIPrevious ABIPrevious TBI  +-------+-----------+-----------+------------+------------+  Right 0.84       0.48       0.59        0.50          +-------+-----------+-----------+------------+------------+  Left  0.70       0.54       0.36        0             +-------+-----------+-----------+------------+------------+      Arterial duplex Right: Total occlusion noted in the popliteal artery.   Left: Total occlusion noted in the popliteal artery.   Yevonne Aline. Stanford Breed, MD [facility] Vascular and Vein Specialists of [facility] Phone Number: [phone] 01/06/2023 4:14 PM   Total time spent on preparing this encounter including chart review, data review, collecting history, examining the patient, coordinating care for this new patient, 60 minutes.  Portions of this report may have been transcribed using voice recognition software.  Every effort has been made to ensure accuracy; however, inadvertent computerized transcription errors may still be present.

## 2023-01-07 ENCOUNTER — Ambulatory Visit (INDEPENDENT_AMBULATORY_CARE_PROVIDER_SITE_OTHER): Payer: BC Managed Care – PPO | Admitting: Vascular Surgery

## 2023-01-07 ENCOUNTER — Ambulatory Visit: Payer: BC Managed Care – PPO | Admitting: Behavioral Health

## 2023-01-07 ENCOUNTER — Encounter: Payer: Self-pay | Admitting: Vascular Surgery

## 2023-01-07 ENCOUNTER — Ambulatory Visit (INDEPENDENT_AMBULATORY_CARE_PROVIDER_SITE_OTHER)
Admission: RE | Admit: 2023-01-07 | Discharge: 2023-01-07 | Disposition: A | Discharge status: Home / Self Care | Payer: BC Managed Care – PPO | Source: Ambulatory Visit | Attending: Vascular Surgery | Admitting: Vascular Surgery

## 2023-01-07 ENCOUNTER — Ambulatory Visit (HOSPITAL_COMMUNITY)
Admission: RE | Admit: 2023-01-07 | Discharge: 2023-01-07 | Disposition: A | Discharge status: Home / Self Care | Payer: BC Managed Care – PPO | Source: Ambulatory Visit | Attending: Vascular Surgery | Admitting: Vascular Surgery

## 2023-01-07 VITALS — BP 105/62 | HR 66 | Temp 98.1°F | Resp 20 | Ht 66.0 in | Wt 145.0 lb

## 2023-01-07 DIAGNOSIS — I70223 Atherosclerosis of native arteries of extremities with rest pain, bilateral legs: Secondary | ICD-10-CM

## 2023-01-07 DIAGNOSIS — I739 Peripheral vascular disease, unspecified: Secondary | ICD-10-CM | POA: Diagnosis not present

## 2023-01-07 DIAGNOSIS — M79604 Pain in right leg: Secondary | ICD-10-CM | POA: Insufficient documentation

## 2023-01-07 DIAGNOSIS — M79605 Pain in left leg: Secondary | ICD-10-CM | POA: Diagnosis not present

## 2023-01-08 ENCOUNTER — Telehealth: Payer: Self-pay | Admitting: Medical Oncology

## 2023-01-08 DIAGNOSIS — Z8701 Personal history of pneumonia (recurrent): Secondary | ICD-10-CM | POA: Diagnosis not present

## 2023-01-08 DIAGNOSIS — J910 Malignant pleural effusion: Secondary | ICD-10-CM | POA: Diagnosis not present

## 2023-01-08 DIAGNOSIS — C3492 Malignant neoplasm of unspecified part of left bronchus or lung: Secondary | ICD-10-CM | POA: Diagnosis not present

## 2023-01-08 DIAGNOSIS — Z48813 Encounter for surgical aftercare following surgery on the respiratory system: Secondary | ICD-10-CM | POA: Diagnosis not present

## 2023-01-08 DIAGNOSIS — Z7951 Long term (current) use of inhaled steroids: Secondary | ICD-10-CM | POA: Diagnosis not present

## 2023-01-08 DIAGNOSIS — F419 Anxiety disorder, unspecified: Secondary | ICD-10-CM | POA: Diagnosis not present

## 2023-01-08 DIAGNOSIS — D630 Anemia in neoplastic disease: Secondary | ICD-10-CM | POA: Diagnosis not present

## 2023-01-08 DIAGNOSIS — D72829 Elevated white blood cell count, unspecified: Secondary | ICD-10-CM | POA: Diagnosis not present

## 2023-01-08 DIAGNOSIS — Z4801 Encounter for change or removal of surgical wound dressing: Secondary | ICD-10-CM | POA: Diagnosis not present

## 2023-01-08 DIAGNOSIS — Z9181 History of falling: Secondary | ICD-10-CM | POA: Diagnosis not present

## 2023-01-08 LAB — FACTOR 5 LEIDEN

## 2023-01-08 LAB — VAS US ABI WITH/WO TBI
Left ABI: 0.7
Right ABI: 0.84

## 2023-01-08 LAB — PROTHROMBIN GENE MUTATION

## 2023-01-08 NOTE — Telephone Encounter (Signed)
[facility] needs all imaging for Friday appt.  Request sent to [facility] to push them to [facility].  Pt notified.  She will call [facility] to see if they can view them . If not she will call and ask to pick p CDs.

## 2023-01-10 ENCOUNTER — Other Ambulatory Visit: Payer: Self-pay

## 2023-01-10 DIAGNOSIS — R590 Localized enlarged lymph nodes: Secondary | ICD-10-CM | POA: Diagnosis not present

## 2023-01-10 DIAGNOSIS — I739 Peripheral vascular disease, unspecified: Secondary | ICD-10-CM

## 2023-01-10 DIAGNOSIS — C3491 Malignant neoplasm of unspecified part of right bronchus or lung: Secondary | ICD-10-CM | POA: Diagnosis not present

## 2023-01-10 DIAGNOSIS — J90 Pleural effusion, not elsewhere classified: Secondary | ICD-10-CM | POA: Diagnosis not present

## 2023-01-10 DIAGNOSIS — R9402 Abnormal brain scan: Secondary | ICD-10-CM | POA: Diagnosis not present

## 2023-01-10 DIAGNOSIS — Z6823 Body mass index (BMI) 23.0-23.9, adult: Secondary | ICD-10-CM | POA: Diagnosis not present

## 2023-01-10 DIAGNOSIS — Z01419 Encounter for gynecological examination (general) (routine) without abnormal findings: Secondary | ICD-10-CM | POA: Diagnosis not present

## 2023-01-10 DIAGNOSIS — M899 Disorder of bone, unspecified: Secondary | ICD-10-CM | POA: Diagnosis not present

## 2023-01-10 NOTE — Progress Notes (Unsigned)
[place]  [phone] [phone]   Name: Melissa Pineda Date: 01/10/2023 MRN: [id number]  DOB: [birth year]  Patient Care Team: Patient, No Pcp Per as PCP - General ([place]) Acquanetta Chain, DO as Consulting Physician [facility] and Palliative Medicine)    INTERVAL HISTORY: Melissa Pineda is a 37 y.o. female with  oncologic medical history including newly diagnosed ROS1 Non-Small Cell Lung Cancer, stage IV at time of diagnosis s/p pleurx for malignant pleural effusion and known diffuse metastatic lymphadenopathy and osseous mets (10/2022). Currently on Repotrectinib and tolerating well. Recently underwent thrombectomy due to bilateral lower extremity arterial occlusion per Dr.Hawkens.  Patient is on Plavix, aspirin, Xarelto.  Palliative ask to see for symptom management and goals of care.   SOCIAL HISTORY:     reports that she has never smoked. She has never used smokeless tobacco. She reports current alcohol use of about 7.0 standard drinks of alcohol per week. She reports current drug use. Drug: Marijuana.  ADVANCE DIRECTIVES:  None on file  CODE STATUS: Full code  PAST MEDICAL HISTORY: Past Medical History:  Diagnosis Date   Anxiety    Phreesia 11/21/2020   Cancer ([place])     ALLERGIES:  is allergic to hydrocodone and tramadol.  MEDICATIONS:  Current Outpatient Medications  Medication Sig Dispense Refill   acetaminophen (TYLENOL) 325 MG tablet Take 650 mg by mouth every 6 (six) hours as needed for mild pain (for mild pain.).     ALPRAZolam (XANAX) 0.5 MG tablet Take 1 tablet (0.5 mg total) by mouth 3 (three) times daily as needed for anxiety. (Patient taking differently: Take 0.5 mg by mouth 2 (two) times daily as needed for anxiety.) 60 tablet 0   amoxicillin-clavulanate (AUGMENTIN) 875-125 MG tablet Take 1 tablet by mouth 2 (two) times daily. 14 tablet 0   aspirin EC 81 MG tablet Take 1 tablet (81 mg  total) by mouth daily. Swallow whole. 30 tablet 12   Cholecalciferol (VITAMIN D-3) 125 MCG (5000 UT) TABS Take 1 tablet by mouth daily.     clopidogrel (PLAVIX) 75 MG tablet Take 1 tablet (75 mg total) by mouth daily. 30 tablet 11   Ferrous Sulfate (IRON HIGH-POTENCY) 142 (45 Fe) MG TBCR Take 142 mg by mouth daily.     gabapentin (NEURONTIN) 100 MG capsule Take 200 mg by mouth 2 (two) times daily.     levonorgestrel (MIRENA, 52 MG,) 20 MCG/DAY IUD 1 each by Intrauterine route once.     Magnesium Oxide -Mg Supplement 400 MG CAPS Take 400 mg by mouth 2 (two) times daily. 60 capsule 0   metoCLOPramide (REGLAN) 5 MG tablet Take 1 tablet (5 mg total) by mouth every 6 (six) hours as needed for nausea. 30 tablet 1   ondansetron (ZOFRAN) 8 MG tablet Take 1 tablet (8 mg total) by mouth every 8 (eight) hours as needed for nausea or vomiting. 20 tablet 0   ondansetron (ZOFRAN-ODT) 8 MG disintegrating tablet Take 1 tablet (8 mg total) by mouth every 8 (eight) hours as needed for nausea or vomiting. 20 tablet 1   Ondansetron 4 MG FILM Take 4-8 mg by mouth every 6 (six) hours as needed. 15 each 0   OVER THE COUNTER MEDICATION Take 2 tablets by mouth daily. Beet root     oxyCODONE (ROXICODONE) 5 MG immediate release tablet Take 1 tablet (5 mg total) by mouth every 4 (four) hours as needed for severe pain. (Patient taking  differently: Take 5 mg by mouth every 6 (six) hours as needed for severe pain.) 120 tablet 0   pantoprazole (PROTONIX) 40 MG tablet Take 1 tablet (40 mg total) by mouth daily. 30 tablet 3   polyethylene glycol (MIRALAX / GLYCOLAX) 17 g packet Take 17 g by mouth daily.     Repotrectinib 40 MG CAPS Take 160 mg by mouth daily. (Patient taking differently: Take 160 mg by mouth 2 (two) times daily.) 56 capsule 0   rivaroxaban (XARELTO) 20 MG TABS tablet Take 1 tablet (20 mg total) by mouth daily with supper. 30 tablet 3   Turmeric (QC TUMERIC COMPLEX PO) Take 500 mg by mouth daily.     zinc sulfate  220 (50 Zn) MG capsule Take 220 mg by mouth daily.     Current Facility-Administered Medications  Medication Dose Route Frequency Provider Last Rate Last Admin   famotidine (PEPCID) tablet 20 mg  20 mg Oral Once Lane Hacker L, DO        VITAL SIGNS: There were no vitals taken for this visit. There were no vitals filed for this visit.  Estimated body mass index is 23.4 kg/m as calculated from the following:   Height as of 01/07/23: 5\' 6"  (1.676 m).   Weight as of 01/07/23: 145 lb (65.8 kg).   PERFORMANCE STATUS (ECOG) : 1 - Symptomatic but completely ambulatory   Physical Exam General: NAD Cardiovascular: regular rate and rhythm Pulmonary: normal breathing pattern  Neurological: AAO x4   IMPRESSION: Mrs. Melissa Pineda presents to clinic today for symptom management follow-up. Shares her appreciation of her response to treatment as discussed today with Dr. Julien Nordmann. No acute distress noted. Appetite is good. Denies nausea, vomiting, constipation, or diarrhea.  Denies any ongoing pain.  This is significantly improved.  She is not requiring around-the-clock pain medication.  Has been able to successfully wean off of gabapentin.  Will continue Xanax as needed.  We discussed appreciation of how well she is doing and ability to be able to do the things she once enjoyed most importantly being a mother.  Alnita understands we are available as needed and will continue to closely monitor and offer support.  I discussed the importance of continued conversation with family and their medical providers regarding overall plan of care and treatment options, ensuring decisions are within the context of the patients values and GOCs.  PLAN:  Ongoing goals of care and symptom management support Have successfully discontinued use of gabapentin.   No longer requiring around-the-clock pain medication.  Only as needed.  Has not taken in several weeks. Continue Xanax as needed.  Refill sent to pharmacy. I  will plan to see patient back in 4-6 weeks in collaboration to other oncology appointments.   Patient expressed understanding and was in agreement with this plan. She also understands that She can call the clinic at any time with any questions, concerns, or complaints.   Any controlled substances utilized were prescribed in the context of palliative care. PDMP has been reviewed.   Time Total: 20 min   Visit consisted of counseling and education dealing with the complex and emotionally intense issues of symptom management and palliative care in the setting of serious and potentially life-threatening illness.Greater than 50%  of this time was spent counseling and coordinating care related to the above assessment and plan.  Alda Lea, AGPCNP-BC  Palliative Medicine Team/[place]  *Please note that this is a verbal dictation therefore any spelling or grammatical  errors are due to the "[employer]" system interpretation.

## 2023-01-13 ENCOUNTER — Other Ambulatory Visit: Payer: Self-pay | Admitting: Medical Oncology

## 2023-01-13 DIAGNOSIS — C3492 Malignant neoplasm of unspecified part of left bronchus or lung: Secondary | ICD-10-CM

## 2023-01-13 DIAGNOSIS — Z79899 Other long term (current) drug therapy: Secondary | ICD-10-CM | POA: Diagnosis not present

## 2023-01-13 DIAGNOSIS — Z7901 Long term (current) use of anticoagulants: Secondary | ICD-10-CM | POA: Diagnosis not present

## 2023-01-14 ENCOUNTER — Telehealth: Payer: Self-pay | Admitting: Medical Oncology

## 2023-01-14 DIAGNOSIS — M9903 Segmental and somatic dysfunction of lumbar region: Secondary | ICD-10-CM | POA: Diagnosis not present

## 2023-01-14 DIAGNOSIS — M9901 Segmental and somatic dysfunction of cervical region: Secondary | ICD-10-CM | POA: Diagnosis not present

## 2023-01-14 DIAGNOSIS — M542 Cervicalgia: Secondary | ICD-10-CM | POA: Diagnosis not present

## 2023-01-14 DIAGNOSIS — M62830 Muscle spasm of back: Secondary | ICD-10-CM | POA: Diagnosis not present

## 2023-01-14 NOTE — Telephone Encounter (Signed)
Seh will f/u with [facility] tomorrow re Dr Samuel Germany appt

## 2023-01-15 ENCOUNTER — Inpatient Hospital Stay: Payer: BC Managed Care – PPO

## 2023-01-15 ENCOUNTER — Inpatient Hospital Stay (HOSPITAL_BASED_OUTPATIENT_CLINIC_OR_DEPARTMENT_OTHER): Payer: BC Managed Care – PPO | Admitting: Internal Medicine

## 2023-01-15 ENCOUNTER — Other Ambulatory Visit: Payer: Self-pay | Admitting: Medical Oncology

## 2023-01-15 ENCOUNTER — Encounter: Payer: Self-pay | Admitting: Nurse Practitioner

## 2023-01-15 ENCOUNTER — Encounter: Payer: Self-pay | Admitting: Internal Medicine

## 2023-01-15 ENCOUNTER — Inpatient Hospital Stay (HOSPITAL_BASED_OUTPATIENT_CLINIC_OR_DEPARTMENT_OTHER): Payer: BC Managed Care – PPO | Admitting: Nurse Practitioner

## 2023-01-15 VITALS — BP 121/65 | HR 89 | Temp 97.7°F | Resp 17 | Wt 146.5 lb

## 2023-01-15 DIAGNOSIS — C3490 Malignant neoplasm of unspecified part of unspecified bronchus or lung: Secondary | ICD-10-CM | POA: Diagnosis not present

## 2023-01-15 DIAGNOSIS — D649 Anemia, unspecified: Secondary | ICD-10-CM | POA: Diagnosis not present

## 2023-01-15 DIAGNOSIS — Z793 Long term (current) use of hormonal contraceptives: Secondary | ICD-10-CM | POA: Diagnosis not present

## 2023-01-15 DIAGNOSIS — Z515 Encounter for palliative care: Secondary | ICD-10-CM

## 2023-01-15 DIAGNOSIS — G893 Neoplasm related pain (acute) (chronic): Secondary | ICD-10-CM | POA: Diagnosis not present

## 2023-01-15 DIAGNOSIS — F419 Anxiety disorder, unspecified: Secondary | ICD-10-CM

## 2023-01-15 DIAGNOSIS — C3492 Malignant neoplasm of unspecified part of left bronchus or lung: Secondary | ICD-10-CM

## 2023-01-15 DIAGNOSIS — C7951 Secondary malignant neoplasm of bone: Secondary | ICD-10-CM | POA: Diagnosis not present

## 2023-01-15 DIAGNOSIS — F1290 Cannabis use, unspecified, uncomplicated: Secondary | ICD-10-CM | POA: Diagnosis not present

## 2023-01-15 DIAGNOSIS — Z79899 Other long term (current) drug therapy: Secondary | ICD-10-CM | POA: Diagnosis not present

## 2023-01-15 DIAGNOSIS — Z7982 Long term (current) use of aspirin: Secondary | ICD-10-CM | POA: Diagnosis not present

## 2023-01-15 DIAGNOSIS — Z7902 Long term (current) use of antithrombotics/antiplatelets: Secondary | ICD-10-CM | POA: Diagnosis not present

## 2023-01-15 DIAGNOSIS — Z7901 Long term (current) use of anticoagulants: Secondary | ICD-10-CM | POA: Diagnosis not present

## 2023-01-15 LAB — CBC WITH DIFFERENTIAL (CANCER CENTER ONLY)
Abs Immature Granulocytes: 0.01 10*3/uL (ref 0.00–0.07)
Basophils Absolute: 0 10*3/uL (ref 0.0–0.1)
Basophils Relative: 0 %
Eosinophils Absolute: 0.2 10*3/uL (ref 0.0–0.5)
Eosinophils Relative: 4 %
HCT: 30.1 % — ABNORMAL LOW (ref 36.0–46.0)
Hemoglobin: 9.8 g/dL — ABNORMAL LOW (ref 12.0–15.0)
Immature Granulocytes: 0 %
Lymphocytes Relative: 30 %
Lymphs Abs: 1.6 10*3/uL (ref 0.7–4.0)
MCH: 29.7 pg (ref 26.0–34.0)
MCHC: 32.6 g/dL (ref 30.0–36.0)
MCV: 91.2 fL (ref 80.0–100.0)
Monocytes Absolute: 0.3 10*3/uL (ref 0.1–1.0)
Monocytes Relative: 6 %
Neutro Abs: 3.1 10*3/uL (ref 1.7–7.7)
Neutrophils Relative %: 60 %
Platelet Count: 622 10*3/uL — ABNORMAL HIGH (ref 150–400)
RBC: 3.3 MIL/uL — ABNORMAL LOW (ref 3.87–5.11)
RDW: 15.4 % (ref 11.5–15.5)
WBC Count: 5.3 10*3/uL (ref 4.0–10.5)
nRBC: 0 % (ref 0.0–0.2)

## 2023-01-15 LAB — PREGNANCY, URINE: Preg Test, Ur: NEGATIVE

## 2023-01-15 LAB — IRON AND IRON BINDING CAPACITY (CC-WL,HP ONLY)
Iron: 79 ug/dL (ref 28–170)
Saturation Ratios: 24 % (ref 10.4–31.8)
TIBC: 335 ug/dL (ref 250–450)
UIBC: 256 ug/dL (ref 148–442)

## 2023-01-15 LAB — FERRITIN: Ferritin: 43 ng/mL (ref 11–307)

## 2023-01-15 LAB — URIC ACID: Uric Acid, Serum: 3.1 mg/dL (ref 2.5–7.1)

## 2023-01-15 MED ORDER — ALPRAZOLAM 0.5 MG PO TABS: 0.5000 mg | ORAL_TABLET | Freq: Three times a day (TID) | ORAL | 0 refills | Status: DC | PRN

## 2023-01-15 NOTE — Progress Notes (Signed)
[facility] [phone]   [phone]  OFFICE PROGRESS NOTE  Patient, No Pcp Per No address on file  DIAGNOSIS: Stage IVB (T4, N3, M1b) non-small cell lung cancer, adenocarcinoma presented with large left diaphragmatic surface mass in addition to left hilar, infrahilar, AP window, right and left paratracheal, subcarinal as well as left internal mammary lymphadenopathy in addition to right gastric lymph node and solitary Small right frontal calvarial osseous metastatic disease with large malignant left pleural effusion diagnosed in January 2024.    Molecular studies by [employer] as well as [employer] blood test showed:   EZR-ROS1 fusion approved by FDA Crizotinib, Entrectinib, Repotrectinib   approved in other indication Ceritinib, Lorlatinib Yes      3.6%   PD-L1 expression by foundation [employer] that was 98%.   PRIOR THERAPY: Status post left Pleurx catheter placement for drainage of recurrent left pleural effusion.  This was removed on December 17, 2022.  CURRENT THERAPY: Repotrectinib (Augtyro) 160 mg p.o. daily for the first 2 weeks and then 160 mg p.o. twice daily, first dose November 27, 2022.  INTERVAL HISTORY: Kia [age] y.o. female returns to the clinic today for follow-up visit accompanied by her mother.  The patient is feeling fine today with no concerning complaints except for numbness in the chin area as well as lisp in her language started several days ago and usually good in the morning but did get worse in the afternoon and evening.  She denied having any other neurological issues.  She has no current chest pain, shortness of breath, cough or hemoptysis.  She continues to complain of fatigue.  She has no significant weight loss or night sweats.  She has no headache or visual changes.  She continues to tolerate her treatment with repotrectinib fairly well.  She was on treatment with gabapentin and this was discontinued.  The patient  was seen by Dr. Carlis Abbott at [facility] few days ago and she had imaging studies that showed significant improvement of her disease.  She also had CT of the brain with contrast that was unremarkable except for the known calvarial lesions.  She is here today for evaluation and recommendation regarding her condition.  MEDICAL HISTORY: Past Medical History:  Diagnosis Date   Anxiety    [employer] 11/21/2020   Cancer ([place])     ALLERGIES:  is allergic to hydrocodone and tramadol.  MEDICATIONS:  Current Outpatient Medications  Medication Sig Dispense Refill   acetaminophen (TYLENOL) 325 MG tablet Take 650 mg by mouth every 6 (six) hours as needed for mild pain (for mild pain.).     ALPRAZolam (XANAX) 0.5 MG tablet Take 1 tablet (0.5 mg total) by mouth 3 (three) times daily as needed for anxiety. (Patient taking differently: Take 0.5 mg by mouth 2 (two) times daily as needed for anxiety.) 60 tablet 0   amoxicillin-clavulanate (AUGMENTIN) 875-125 MG tablet Take 1 tablet by mouth 2 (two) times daily. 14 tablet 0   aspirin EC 81 MG tablet Take 1 tablet (81 mg total) by mouth daily. Swallow whole. 30 tablet 12   Cholecalciferol (VITAMIN D-3) 125 MCG (5000 UT) TABS Take 1 tablet by mouth daily.     clopidogrel (PLAVIX) 75 MG tablet Take 1 tablet (75 mg total) by mouth daily. 30 tablet 11   Ferrous Sulfate (IRON HIGH-POTENCY) 142 (45 Fe) MG TBCR Take 142 mg by mouth daily.     gabapentin (NEURONTIN) 100 MG capsule Take 200  mg by mouth 2 (two) times daily.     levonorgestrel (MIRENA, 52 MG,) 20 MCG/DAY IUD 1 each by Intrauterine route once.     Magnesium Oxide -Mg Supplement 400 MG CAPS Take 400 mg by mouth 2 (two) times daily. 60 capsule 0   metoCLOPramide (REGLAN) 5 MG tablet Take 1 tablet (5 mg total) by mouth every 6 (six) hours as needed for nausea. 30 tablet 1   ondansetron (ZOFRAN) 8 MG tablet Take 1 tablet (8 mg total) by mouth every 8 (eight) hours as needed for nausea or vomiting. 20 tablet 0    ondansetron (ZOFRAN-ODT) 8 MG disintegrating tablet Take 1 tablet (8 mg total) by mouth every 8 (eight) hours as needed for nausea or vomiting. 20 tablet 1   Ondansetron 4 MG FILM Take 4-8 mg by mouth every 6 (six) hours as needed. 15 each 0   OVER THE COUNTER MEDICATION Take 2 tablets by mouth daily. Beet root     oxyCODONE (ROXICODONE) 5 MG immediate release tablet Take 1 tablet (5 mg total) by mouth every 4 (four) hours as needed for severe pain. (Patient taking differently: Take 5 mg by mouth every 6 (six) hours as needed for severe pain.) 120 tablet 0   pantoprazole (PROTONIX) 40 MG tablet Take 1 tablet (40 mg total) by mouth daily. 30 tablet 3   polyethylene glycol (MIRALAX / GLYCOLAX) 17 g packet Take 17 g by mouth daily.     Repotrectinib 40 MG CAPS Take 160 mg by mouth daily. (Patient taking differently: Take 160 mg by mouth 2 (two) times daily.) 56 capsule 0   rivaroxaban (XARELTO) 20 MG TABS tablet Take 1 tablet (20 mg total) by mouth daily with supper. 30 tablet 3   Turmeric (QC TUMERIC COMPLEX PO) Take 500 mg by mouth daily.     zinc sulfate 220 (50 Zn) MG capsule Take 220 mg by mouth daily.     Current Facility-Administered Medications  Medication Dose Route Frequency Provider Last Rate Last Admin   famotidine (PEPCID) tablet 20 mg  20 mg Oral Once Acquanetta Chain, DO        SURGICAL HISTORY:  Past Surgical History:  Procedure Laterality Date   ABDOMINAL AORTOGRAM W/LOWER EXTREMITY N/A 11/27/2022   Procedure: ABDOMINAL AORTOGRAM W/LOWER EXTREMITY;  Surgeon: Cherre Robins, MD;  Location: [facility];  Service: Cardiovascular;  Laterality: N/A;   IR PERC PLEURAL DRAIN W/INDWELL CATH W/IMG GUIDE  11/09/2022   PERIPHERAL VASCULAR ATHERECTOMY  11/27/2022   Procedure: PERIPHERAL VASCULAR ATHERECTOMY;  Surgeon: Cherre Robins, MD;  Location: [place];  Service: Cardiovascular;;   PERIPHERAL VASCULAR THROMBECTOMY  11/27/2022   Procedure: PERIPHERAL VASCULAR  THROMBECTOMY;  Surgeon: Cherre Robins, MD;  Location: [place] [facility];  Service: Cardiovascular;;   REMOVAL OF PLEURAL DRAINAGE CATHETER N/A 12/17/2022   Procedure: MINOR REMOVAL OF PLEURAL DRAINAGE CATHETER;  Surgeon: Garner Nash, DO;  Location: [facility];  Service: Cardiopulmonary;  Laterality: N/A;   WISDOM TOOTH EXTRACTION      REVIEW OF SYSTEMS:  Constitutional: positive for fatigue Eyes: negative Ears, nose, mouth, throat, and face: negative, numbness in the left side of the chin Respiratory: negative Cardiovascular: negative Gastrointestinal: negative Genitourinary:negative Integument/breast: negative Hematologic/lymphatic: negative Musculoskeletal:negative Neurological: positive for new lisp Behavioral/Psych: negative Endocrine: negative Allergic/Immunologic: negative   PHYSICAL EXAMINATION: General appearance: alert, cooperative, fatigued, and no distress Head: Normocephalic, without obvious abnormality, atraumatic Neck: no adenopathy, no JVD, supple, symmetrical, trachea midline,  and thyroid not enlarged, symmetric, no tenderness/mass/nodules Lymph nodes: Cervical, supraclavicular, and axillary nodes normal. Resp: clear to auscultation bilaterally Back: symmetric, no curvature. ROM normal. No CVA tenderness. Cardio: regular rate and rhythm, S1, S2 normal, no murmur, click, rub or gallop GI: soft, non-tender; bowel sounds normal; no masses,  no organomegaly Extremities: extremities normal, atraumatic, no cyanosis or edema Neurologic: Alert and oriented X 3, normal strength and tone. Normal symmetric reflexes. Normal coordination and gait  ECOG PERFORMANCE STATUS: 1 - Symptomatic but completely ambulatory  Blood pressure 121/65, pulse 89, temperature 97.7 F (36.5 C), temperature source Oral, resp. rate 17, weight 146 lb 8 oz (66.5 kg), SpO2 100 %, not currently breastfeeding.  LABORATORY DATA: Lab Results  Component Value Date   WBC 5.3 01/15/2023   HGB  9.8 (L) 01/15/2023   HCT 30.1 (L) 01/15/2023   MCV 91.2 01/15/2023   PLT 622 (H) 01/15/2023      Chemistry      Component Value Date/Time   NA 142 12/26/2022 0858   NA 140 11/24/2020 1106   K 3.6 12/26/2022 0858   CL 107 12/26/2022 0858   CO2 29 12/26/2022 0858   BUN 12 12/26/2022 0858   BUN 7 11/24/2020 1106   CREATININE 0.67 12/26/2022 0858      Component Value Date/Time   CALCIUM 9.2 12/26/2022 0858   ALKPHOS 84 12/26/2022 0858   AST 16 12/26/2022 0858   ALT 11 12/26/2022 0858   BILITOT 0.4 12/26/2022 0858       RADIOGRAPHIC STUDIES: VAS [place] LOWER EXTREMITY ARTERIAL DUPLEX  Result Date: 01/08/2023 LOWER EXTREMITY ARTERIAL DUPLEX STUDY Patient Name:  KANYLAH CONRY  Date of Exam:   01/07/2023 Medical Rec #: [id number]     Accession #:    [id number] Date of Birth: 1986-05-14      Patient Gender: F Patient Age:   17 years Exam Location:  [facility] Procedure:      VAS [place] LOWER EXTREMITY ARTERIAL DUPLEX Referring Phys: Jamelle Haring --------------------------------------------------------------------------------  Indications: Peripheral artery disease. Other Factors: Adenocarcinoma of left lung.  Vascular Interventions: 11/27/22 Mechanical thrombectomy of left profunda femoral,                         popliteal artery and left ATA. Current ABI:            Right ABI 0.84 left 0.70 Performing Technologist: Alvia Grove RVT  Examination Guidelines: A complete evaluation includes B-mode imaging, spectral Doppler, color Doppler, and power Doppler as needed of all accessible portions of each vessel. Bilateral testing is considered an integral part of a complete examination. Limited examinations for reoccurring indications may be performed as noted.  +-----------+--------+-----+--------+----------+-------------------------------+ RIGHT      PSV cm/sRatioStenosisWaveform  Comments                         +-----------+--------+-----+--------+----------+-------------------------------+ EIA Distal 165                  triphasic                                 +-----------+--------+-----+--------+----------+-------------------------------+ CFA Prox   166                  triphasic                                 +-----------+--------+-----+--------+----------+-------------------------------+  CFA Distal 146                  triphasic                                 +-----------+--------+-----+--------+----------+-------------------------------+ DFA        220                  triphasic                                 +-----------+--------+-----+--------+----------+-------------------------------+ SFA Prox   116                  triphasic                                 +-----------+--------+-----+--------+----------+-------------------------------+ SFA Mid    113                  triphasic                                 +-----------+--------+-----+--------+----------+-------------------------------+ SFA Distal 60                   triphasic                                 +-----------+--------+-----+--------+----------+-------------------------------+ POP Prox   47                   triphasic                                 +-----------+--------+-----+--------+----------+-------------------------------+ POP Mid    38                   triphasic                                 +-----------+--------+-----+--------+----------+-------------------------------+ POP Distal 29 /0                          recanalized plaque, colaterals                                            , occlusion                     +-----------+--------+-----+--------+----------+-------------------------------+ ATA Distal 15                   monophasic                                +-----------+--------+-----+--------+----------+-------------------------------+ PTA  Distal 33                   biphasic                                  +-----------+--------+-----+--------+----------+-------------------------------+  PERO Distal16                   monophasic                                +-----------+--------+-----+--------+----------+-------------------------------+  +-----------+--------+-----+--------+----------+-------------------+ LEFT       PSV cm/sRatioStenosisWaveform  Comments            +-----------+--------+-----+--------+----------+-------------------+ EIA Distal 188                  triphasic                     +-----------+--------+-----+--------+----------+-------------------+ CFA Prox   177                  triphasic                     +-----------+--------+-----+--------+----------+-------------------+ CFA Distal 151                  triphasic                     +-----------+--------+-----+--------+----------+-------------------+ DFA        135                  triphasic                     +-----------+--------+-----+--------+----------+-------------------+ SFA Prox   145                  triphasic                     +-----------+--------+-----+--------+----------+-------------------+ SFA Mid    136                  triphasic                     +-----------+--------+-----+--------+----------+-------------------+ SFA Distal 60                   triphasic                     +-----------+--------+-----+--------+----------+-------------------+ POP Prox   49                   triphasic                     +-----------+--------+-----+--------+----------+-------------------+ POP Mid    29           occludedtriphasic dampened, occlusion +-----------+--------+-----+--------+----------+-------------------+ ATA Distal 19                   monophasic                    +-----------+--------+-----+--------+----------+-------------------+ PTA Distal 34                    triphasic                     +-----------+--------+-----+--------+----------+-------------------+ PERO Distal11                   monophasicdampened            +-----------+--------+-----+--------+----------+-------------------+  Summary: Right: Total occlusion noted in the popliteal artery. Left: Total occlusion noted in the popliteal artery.  See table(s) above for measurements and observations. Electronically signed by Marcello Moores  Hawken on 01/08/2023 at 8:35:05 AM.    Final    VAS [place] ABI WITH/WO TBI  Result Date: 01/08/2023  LOWER EXTREMITY DOPPLER STUDY Patient Name:  SHARVON RADICH  Date of Exam:   01/07/2023 Medical Rec #: [id number]     Accession #:    [id number] Date of Birth: May 03, 1986      Patient Gender: F Patient Age:   37 years Exam Location:  [facility] Procedure:      VAS [place] ABI WITH/WO TBI Referring Phys: Jamelle Haring --------------------------------------------------------------------------------  Indications: Peripheral artery disease. Other Factors: Adenocarcinoma of left lung.  Vascular Interventions: 11/27/22 Mechanical thrombectomy of left profunda femoral,                         popliteal artery and left ATA. Performing Technologist: Alvia Grove RVT  Examination Guidelines: A complete evaluation includes at minimum, Doppler waveform signals and systolic blood pressure reading at the level of bilateral brachial, anterior tibial, and posterior tibial arteries, when vessel segments are accessible. Bilateral testing is considered an integral part of a complete examination. Photoelectric Plethysmograph (PPG) waveforms and toe systolic pressure readings are included as required and additional duplex testing as needed. Limited examinations for reoccurring indications may be performed as noted.  ABI Findings: +---------+------------------+-----+----------+--------+ Right    Rt Pressure (mmHg)IndexWaveform  Comment  +---------+------------------+-----+----------+--------+  Brachial 116                                       +---------+------------------+-----+----------+--------+ PTA      98                0.84 monophasicbrisk    +---------+------------------+-----+----------+--------+ DP       90                0.78 monophasicbrisk    +---------+------------------+-----+----------+--------+ Great Toe56                0.48 Abnormal           +---------+------------------+-----+----------+--------+ +---------+------------------+-----+----------+-------+ Left     Lt Pressure (mmHg)IndexWaveform  Comment +---------+------------------+-----+----------+-------+ Brachial 113                                      +---------+------------------+-----+----------+-------+ PTA      81                0.70 biphasic          +---------+------------------+-----+----------+-------+ DP       74                0.64 monophasicbrisk   +---------+------------------+-----+----------+-------+ Great Toe63                0.54 Abnormal          +---------+------------------+-----+----------+-------+ +-------+-----------+-----------+------------+------------+ ABI/TBIToday's ABIToday's TBIPrevious ABIPrevious TBI +-------+-----------+-----------+------------+------------+ Right  0.84       0.48       0.59        0.50         +-------+-----------+-----------+------------+------------+ Left   0.70       0.54       0.36        0            +-------+-----------+-----------+------------+------------+  Right ABIs appear increased. Left ABIs and TBIs appear increased.  Summary: Right: Resting right ankle-brachial index indicates mild right lower extremity arterial disease. The right toe-brachial index is abnormal. Left: Resting left ankle-brachial index indicates moderate left lower extremity arterial disease. The left toe-brachial index is abnormal. *See table(s) above for measurements and observations.  Electronically signed by Jamelle Haring on  01/08/2023 at 8:34:48 AM.    Final    CT Angio Chest Pulmonary Embolism (PE) W or WO Contrast  Result Date: 01/01/2023 CLINICAL DATA:  Stage IV left lung adenocarcinoma, short of breath for 4 days, history of DVT EXAM: CT ANGIOGRAPHY CHEST WITH CONTRAST TECHNIQUE: Multidetector CT imaging of the chest was performed using the standard protocol during bolus administration of intravenous contrast. Multiplanar CT image reconstructions and MIPs were obtained to evaluate the vascular anatomy. RADIATION DOSE REDUCTION: This exam was performed according to the departmental dose-optimization program which includes automated exposure control, adjustment of the mA and/or kV according to patient size and/or use of iterative reconstruction technique. CONTRAST:  84mL OMNIPAQUE IOHEXOL 350 MG/ML SOLN COMPARISON:  12/13/2022, 11/28/2022 FINDINGS: Cardiovascular: This is a technically adequate evaluation of the pulmonary vasculature. No filling defects or pulmonary emboli. The heart is unremarkable without pericardial effusion. The left atrium mural thrombus seen on prior exam is no longer identified. No intraluminal thrombus seen within the cardiac chambers. No evidence of thoracic aortic aneurysm or dissection. Mediastinum/Nodes: The mediastinal and left hilar adenopathy seen on prior exam demonstrates significant improvement in the interim. There is no discrete measurable lymph node evident on this study, with decreased soft tissue prominence surrounding the trachea, within the AP window, and within the left hilum since prior study. The thyroid, trachea, and esophagus are unremarkable. Lungs/Pleura: There has been significant improvement in the masslike consolidation of the left lower lobe seen previously, consistent with known lung cancer. On today's exam, the greatest area of residual consolidation measures 2.9 x 2.4 cm reference image 67/5, where this had previously measured up to 5.9 x 4.6 cm. Peripheral consolidation  within the left lower lobe may reflect postobstructive change. Nodularity seen within the right middle lobe on prior study has resolved in the interim. There is decreased partially loculated left pleural effusion, with only trace residual fluid seen at the posterior costophrenic angle. The pleural drainage catheter is seen on prior studies has been removed in the interim. No new areas of airspace disease. No pneumothorax. Central airways are patent. Upper Abdomen: No acute abnormality. Musculoskeletal: No acute or destructive bony lesions. Reconstructed images demonstrate no additional findings. Review of the MIP images confirms the above findings. IMPRESSION: 1. No evidence of pulmonary embolus. 2. Overall positive response to therapy, with marked decrease in the left lower lobe mass, diminished left pleural effusion and pleural thickening, markedly diminished mediastinal and hilar adenopathy, and resolution of the right middle lobe nodule seen previously. 3. Patchy peripheral consolidation within the left lower lobe, which could reflect postobstructive change or pneumonia. 4. Resolution of the left atrial thrombus seen on prior exam. Electronically Signed   By: Randa Ngo M.D.   On: 01/01/2023 18:07    ASSESSMENT AND PLAN: This is a very pleasant 37 years old white female with  Stage IVB (T4, N3, M1b) non-small cell lung cancer, adenocarcinoma presented with large left diaphragmatic surface mass in addition to left hilar, infrahilar, AP window, right and left paratracheal, subcarinal as well as left internal mammary lymphadenopathy in addition to right gastric lymph node and solitary small right frontal calvarial osseous metastatic disease with large malignant left pleural effusion  diagnosed in January 2024.   Fortunately her molecular studies showed positive ROS1 fusion.  The patient also had PD-L1 expression of 98%. The patient is currently undergoing treatment was started therapy with repotrectinib  started November 27, 2022.  She has been tolerating this treatment fairly well except for mild fatigue. She has significant improvement in her general condition and her recent CT scan of the head chest, abdomen and pelvis showed significant improvement of her disease with marked decrease in the left lower lobe mass and diminished left pleural effusion as well as pleural thickening and markedly diminished mediastinal and hilar adenopathy as well as resolution of the right middle lobe lung nodule.  She has no concerning findings for disease progression.  CT of the head with contrast showed no evidence for intracranial metastasis but there was stable calvarial lesions. I recommended for her to continue her current treatment with repotrectinib with the same dose for now but if her symptoms are getting worse, we may consider reducing the dose. The patient developed lisp recently with some numbness in the left side of the chin.  I recommended for her to have MRI of the brain with and without contrast to rule out any small intracranial brain metastasis or leptomeningeal disease. I will also refer her to Dr. Mickeal Skinner neuro-oncology for evaluation of this condition. For the anemia, she will continue on the oral iron tablets. I will see her back for follow-up visit in 1 months for evaluation and repeat blood work. For pain management she is currently on oxycodone and followed by the palliative care team. She was advised to call immediately if she has any other concerning symptoms in the interval. The patient voices understanding of current disease status and treatment options and is in agreement with the current care plan.  All questions were answered. The patient knows to call the clinic with any problems, questions or concerns. We can certainly see the patient much sooner if necessary.  The total time spent in the appointment was 40 minutes.  Disclaimer: This note was dictated with voice recognition software.  Similar sounding words can inadvertently be transcribed and may not be corrected upon review.

## 2023-01-16 ENCOUNTER — Other Ambulatory Visit: Payer: Self-pay

## 2023-01-16 ENCOUNTER — Ambulatory Visit
Admission: RE | Admit: 2023-01-16 | Discharge: 2023-01-16 | Disposition: A | Discharge status: Home / Self Care | Payer: BC Managed Care – PPO | Source: Ambulatory Visit | Attending: Internal Medicine | Admitting: Internal Medicine

## 2023-01-16 DIAGNOSIS — I639 Cerebral infarction, unspecified: Secondary | ICD-10-CM | POA: Diagnosis not present

## 2023-01-16 DIAGNOSIS — C3490 Malignant neoplasm of unspecified part of unspecified bronchus or lung: Secondary | ICD-10-CM

## 2023-01-16 LAB — THYROID PANEL WITH TSH
Free Thyroxine Index: 1.5 (ref 1.2–4.9)
T3 Uptake Ratio: 26 % (ref 24–39)
T4, Total: 5.7 ug/dL (ref 4.5–12.0)
TSH: 2.22 u[IU]/mL (ref 0.450–4.500)

## 2023-01-16 MED ORDER — GADOPICLENOL 0.5 MMOL/ML IV SOLN
6.0000 mL | Freq: Once | INTRAVENOUS | Status: AC | PRN
  Administered 2023-01-16: 6 mL via INTRAVENOUS

## 2023-01-16 NOTE — Progress Notes (Signed)
Orders for labs adjusted per Lexine Baton, NP

## 2023-01-17 ENCOUNTER — Telehealth: Payer: Self-pay

## 2023-01-17 NOTE — Telephone Encounter (Signed)
This nurse received a message from this patient stating that she had a message from the provider about her MRI results.  Patient is requesting provider to call her again because she have many questions abut the MRI.  This nurse forwarded patient request to the provider.  No further questions or concerns noted at this time.

## 2023-01-17 NOTE — Telephone Encounter (Signed)
Oral Oncology Patient Advocate Encounter  Received call from [facility] representative [place] regarding refill of bridge program.  Medication is set to be delivered to patient in the next 2-3 days. Orion Crook stated the program would be contacting the patient as well.  [name], CPhT-Adv Oncology Pharmacy Patient [place] Direct Number: [phone]  Fax: [phone]

## 2023-01-20 NOTE — Telephone Encounter (Signed)
Oral Oncology Patient Advocate Encounter  Called [facility] and confirmed Melissa Pineda was delivered to the patient's address on 01/17/23.  [name], CPhT-Adv Oncology Pharmacy Patient [place] Direct Number: [phone]  Fax: [phone]

## 2023-01-21 ENCOUNTER — Other Ambulatory Visit: Payer: Self-pay

## 2023-01-21 ENCOUNTER — Inpatient Hospital Stay: Payer: BC Managed Care – PPO | Attending: Internal Medicine | Admitting: Internal Medicine

## 2023-01-21 VITALS — BP 129/79 | HR 96 | Temp 98.0°F | Resp 14 | Wt 148.5 lb

## 2023-01-21 DIAGNOSIS — Z7982 Long term (current) use of aspirin: Secondary | ICD-10-CM | POA: Insufficient documentation

## 2023-01-21 DIAGNOSIS — I63411 Cerebral infarction due to embolism of right middle cerebral artery: Secondary | ICD-10-CM

## 2023-01-21 DIAGNOSIS — Z7952 Long term (current) use of systemic steroids: Secondary | ICD-10-CM | POA: Insufficient documentation

## 2023-01-21 DIAGNOSIS — Z7901 Long term (current) use of anticoagulants: Secondary | ICD-10-CM | POA: Insufficient documentation

## 2023-01-21 DIAGNOSIS — C7951 Secondary malignant neoplasm of bone: Secondary | ICD-10-CM | POA: Insufficient documentation

## 2023-01-21 DIAGNOSIS — Z793 Long term (current) use of hormonal contraceptives: Secondary | ICD-10-CM | POA: Insufficient documentation

## 2023-01-21 DIAGNOSIS — D649 Anemia, unspecified: Secondary | ICD-10-CM | POA: Insufficient documentation

## 2023-01-21 DIAGNOSIS — Z4682 Encounter for fitting and adjustment of non-vascular catheter: Secondary | ICD-10-CM | POA: Insufficient documentation

## 2023-01-21 DIAGNOSIS — Z86718 Personal history of other venous thrombosis and embolism: Secondary | ICD-10-CM | POA: Diagnosis not present

## 2023-01-21 DIAGNOSIS — C3492 Malignant neoplasm of unspecified part of left bronchus or lung: Secondary | ICD-10-CM | POA: Diagnosis not present

## 2023-01-21 DIAGNOSIS — Z79899 Other long term (current) drug therapy: Secondary | ICD-10-CM | POA: Insufficient documentation

## 2023-01-21 DIAGNOSIS — Z7902 Long term (current) use of antithrombotics/antiplatelets: Secondary | ICD-10-CM | POA: Diagnosis not present

## 2023-01-21 DIAGNOSIS — Z8673 Personal history of transient ischemic attack (TIA), and cerebral infarction without residual deficits: Secondary | ICD-10-CM | POA: Insufficient documentation

## 2023-01-21 DIAGNOSIS — I639 Cerebral infarction, unspecified: Secondary | ICD-10-CM | POA: Insufficient documentation

## 2023-01-21 NOTE — Progress Notes (Signed)
[facility] [place], [place] [address] [phone]   New Patient Evaluation  Date of Service: 01/21/23 Patient Name: Melissa Pineda Patient MRN: [id number] Patient DOB: 04/19/1986 Provider: Ventura Sellers, MD  Identifying Statement:  Melissa Pineda is a 37 y.o. female with Cerebrovascular accident (CVA) due to embolism of right middle cerebral artery who presents for initial consultation and evaluation regarding cancer associated neurologic deficits.    Referring Provider: Curt Bears, MD [address] [place],  [place] [address]  Primary Cancer:  Oncologic History: Oncology History  Adenocarcinoma of left lung, stage 4  11/07/2022 Initial Diagnosis   Adenocarcinoma of left lung, stage 4 ([facility])   11/20/2022 - 11/20/2022 Chemotherapy   Patient is on Treatment Plan : LUNG Pemetrexed + Carboplatin + Bevacizumab q21d        History of Present Illness: The patient's records from the referring physician were obtained and reviewed and the patient interviewed to confirm this HPI.  Melissa Pineda presents to review recent neurologic symptoms.  She describes several days history of slurred speech, dysarticulation, starting (per patient) on March 18th.  Also experienced some brief numbness and/or tingling of the right lower face during this time.  Following three days, she was left with some residual slurring of certain consonants, mild overall.  No other neurologic symptoms since that time.  She has been dosing Xarelto, Aspirin and Plavix for left atrial thrombus diagnosed on 11/28/22.  Doing well on oral TKI with Dr. Julien Nordmann for [place], also following with [facility] oncology.  Medications: Current Outpatient Medications on File Prior to Visit  Medication Sig Dispense Refill   acetaminophen (TYLENOL) 325 MG tablet Take 650 mg by mouth every 6 (six) hours as needed for mild pain (for mild pain.).     ALPRAZolam (XANAX) 0.5 MG tablet Take 1  tablet (0.5 mg total) by mouth 3 (three) times daily as needed for anxiety. 60 tablet 0   aspirin EC 81 MG tablet Take 1 tablet (81 mg total) by mouth daily. Swallow whole. 30 tablet 12   Cholecalciferol (VITAMIN D-3) 125 MCG (5000 UT) TABS Take 1 tablet by mouth daily.     clopidogrel (PLAVIX) 75 MG tablet Take 1 tablet (75 mg total) by mouth daily. 30 tablet 11   Ferrous Sulfate (IRON HIGH-POTENCY) 142 (45 Fe) MG TBCR Take 142 mg by mouth daily.     levonorgestrel (MIRENA, 52 MG,) 20 MCG/DAY IUD 1 each by Intrauterine route once.     Magnesium Oxide -Mg Supplement 400 MG CAPS Take 400 mg by mouth 2 (two) times daily. 60 capsule 0   metoCLOPramide (REGLAN) 5 MG tablet Take 1 tablet (5 mg total) by mouth every 6 (six) hours as needed for nausea. 30 tablet 1   ondansetron (ZOFRAN) 8 MG tablet Take 1 tablet (8 mg total) by mouth every 8 (eight) hours as needed for nausea or vomiting. 20 tablet 0   ondansetron (ZOFRAN-ODT) 8 MG disintegrating tablet Take 1 tablet (8 mg total) by mouth every 8 (eight) hours as needed for nausea or vomiting. 20 tablet 1   Ondansetron 4 MG FILM Take 4-8 mg by mouth every 6 (six) hours as needed. 15 each 0   OVER THE COUNTER MEDICATION Take 2 tablets by mouth daily. Beet root     oxyCODONE (ROXICODONE) 5 MG immediate release tablet Take 1 tablet (5 mg total) by mouth every 4 (four) hours as needed for severe pain. (Patient taking  differently: Take 5 mg by mouth every 6 (six) hours as needed for severe pain.) 120 tablet 0   pantoprazole (PROTONIX) 40 MG tablet Take 1 tablet (40 mg total) by mouth daily. 30 tablet 3   polyethylene glycol (MIRALAX / GLYCOLAX) 17 g packet Take 17 g by mouth daily.     predniSONE (DELTASONE) 50 MG tablet Take 50 mg by mouth as directed. Take one tablet 13 hours , 1 tablet 7 hours and 1 tablet 1 hour prior to CT scan with IV contrast.     Repotrectinib 40 MG CAPS Take 160 mg by mouth daily. (Patient taking differently: Take 160 mg by mouth 2  (two) times daily.) 56 capsule 0   rivaroxaban (XARELTO) 20 MG TABS tablet Take 1 tablet (20 mg total) by mouth daily with supper. 30 tablet 3   Turmeric (QC TUMERIC COMPLEX PO) Take 500 mg by mouth daily.     zinc sulfate 220 (50 Zn) MG capsule Take 220 mg by mouth daily.     Current Facility-Administered Medications on File Prior to Visit  Medication Dose Route Frequency Provider Last Rate Last Admin   famotidine (PEPCID) tablet 20 mg  20 mg Oral Once Acquanetta Chain, DO        Allergies:  Allergies  Allergen Reactions   Hydrocodone Nausea Only    Dizzy, "crawl out of my skin"   Tramadol Other (See Comments)    "Weird feeling"   Iodinated Contrast Media Hives   Past Medical History:  Past Medical History:  Diagnosis Date   Anxiety    [employer] 11/21/2020   Cancer [facility])    Past Surgical History:  Past Surgical History:  Procedure Laterality Date   ABDOMINAL AORTOGRAM W/LOWER EXTREMITY N/A 11/27/2022   Procedure: ABDOMINAL AORTOGRAM W/LOWER EXTREMITY;  Surgeon: Cherre Robins, MD;  Location: [facility] [facility];  Service: Cardiovascular;  Laterality: N/A;   IR PERC PLEURAL DRAIN W/INDWELL CATH W/IMG GUIDE  11/09/2022   PERIPHERAL VASCULAR ATHERECTOMY  11/27/2022   Procedure: PERIPHERAL VASCULAR ATHERECTOMY;  Surgeon: Cherre Robins, MD;  Location: [place] CV LAB;  Service: Cardiovascular;;   PERIPHERAL VASCULAR THROMBECTOMY  11/27/2022   Procedure: PERIPHERAL VASCULAR THROMBECTOMY;  Surgeon: Cherre Robins, MD;  Location: [place] CV LAB;  Service: Cardiovascular;;   REMOVAL OF PLEURAL DRAINAGE CATHETER N/A 12/17/2022   Procedure: MINOR REMOVAL OF PLEURAL DRAINAGE CATHETER;  Surgeon: Garner Nash, DO;  Location: [facility];  Service: Cardiopulmonary;  Laterality: N/A;   WISDOM TOOTH EXTRACTION     Social History:  Social History   Socioeconomic History   Marital status: Married    Spouse name: Not on file   Number of children: Not on file   Years of  education: Not on file   Highest education level: Not on file  Occupational History   Not on file  Tobacco Use   Smoking status: Never   Smokeless tobacco: Never  Vaping Use   Vaping Use: Never used  Substance and Sexual Activity   Alcohol use: Yes    Alcohol/week: 7.0 standard drinks of alcohol    Types: 7 Glasses of wine per week    Comment: 1 glass wine in the evening    Drug use: Yes    Types: Marijuana    Comment: occasional   Sexual activity: Yes    Birth control/protection: I.U.D.  Other Topics Concern   Not on file  Social History Narrative   Not on file  Social Determinants of Health   Financial Resource Strain: Not on file  Food Insecurity: No Food Insecurity (11/26/2022)   Hunger Vital Sign    Worried About Running Out of Food in the Last Year: Never true    Ran Out of Food in the Last Year: Never true  Transportation Needs: No Transportation Needs (11/26/2022)   PRAPARE - Hydrologist (Medical): No    Lack of Transportation (Non-Medical): No  Physical Activity: Not on file  Stress: Not on file  Social Connections: Not on file  Intimate Partner Violence: Not At Risk (11/26/2022)   Humiliation, Afraid, Rape, and Kick questionnaire    Fear of Current or Ex-Partner: No    Emotionally Abused: No    Physically Abused: No    Sexually Abused: No   Family History:  Family History  Problem Relation Age of Onset   Cancer Mother    Cancer Maternal Grandfather    Cancer Maternal Grandmother    Cancer Paternal Grandfather    Hypertension Paternal Grandmother     Review of Systems: Constitutional: Doesn't report fevers, chills or abnormal weight loss Eyes: Doesn't report blurriness of vision Ears, nose, mouth, throat, and face: Doesn't report sore throat Respiratory: Doesn't report cough, dyspnea or wheezes Cardiovascular: Doesn't report palpitation, chest discomfort  Gastrointestinal:  Doesn't report nausea, constipation, diarrhea GU:  Doesn't report incontinence Skin: Doesn't report skin rashes Neurological: Per HPI Musculoskeletal: Doesn't report joint pain Behavioral/Psych: Doesn't report anxiety  Physical Exam: Vitals:   01/21/23 1434  BP: 129/79  Pulse: 96  Resp: 14  Temp: 98 F (36.7 C)  SpO2: 99%   KPS: 90. General: Alert, cooperative, pleasant, in no acute distress Head: Normal EENT: No conjunctival injection or scleral icterus.  Lungs: Resp effort normal Cardiac: Regular rate Abdomen: Non-distended abdomen Skin: No rashes cyanosis or petechiae. Extremities: No clubbing or edema  Neurologic Exam: Mental Status: Awake, alert, attentive to examiner. Oriented to self and environment. Language is fluent with intact comprehension.  Cranial Nerves: Visual acuity is grossly normal. Visual fields are full. Extra-ocular movements intact. No ptosis. Face is symmetric Motor: Tone and bulk are normal. Power is full in both arms and legs. Reflexes are symmetric, no pathologic reflexes present.  Sensory: Intact to light touch Gait: Normal.   Labs: I have reviewed the data as listed    Component Value Date/Time   NA 142 12/26/2022 0858   NA 140 11/24/2020 1106   K 3.6 12/26/2022 0858   CL 107 12/26/2022 0858   CO2 29 12/26/2022 0858   GLUCOSE 74 12/26/2022 0858   BUN 12 12/26/2022 0858   BUN 7 11/24/2020 1106   CREATININE 0.67 12/26/2022 0858   CALCIUM 9.2 12/26/2022 0858   PROT 7.4 12/26/2022 0858   PROT 7.7 11/24/2020 1106   ALBUMIN 4.4 12/26/2022 0858   ALBUMIN 5.0 (H) 11/24/2020 1106   AST 16 12/26/2022 0858   ALT 11 12/26/2022 0858   ALKPHOS 84 12/26/2022 0858   BILITOT 0.4 12/26/2022 0858   GFRNONAA >60 12/26/2022 0858   GFRAA 119 11/24/2020 1106   Lab Results  Component Value Date   WBC 5.3 01/15/2023   NEUTROABS 3.1 01/15/2023   HGB 9.8 (L) 01/15/2023   HCT 30.1 (L) 01/15/2023   MCV 91.2 01/15/2023   PLT 622 (H) 01/15/2023    Imaging:  MR BRAIN W WO CONTRAST  Result Date:  01/16/2023 CLINICAL DATA:  Non-small cell lung cancer (NSCLC), staging. Newly developed lisp,  facial numbness and change in speech. EXAM: MRI HEAD WITHOUT AND WITH CONTRAST TECHNIQUE: Multiplanar, multiecho pulse sequences of the brain and surrounding structures were obtained without and with intravenous contrast. CONTRAST:  6 mL Vueway. COMPARISON:  Brain MRI 11/07/2022. FINDINGS: Brain: Interval development of a late subacute infarct in the right putamen/corona radiata with additional small foci of late subacute infarction in the right centrum semiovale. No intracranial hemorrhage. No mass or abnormal enhancement. No hydrocephalus or extra-axial collection. Vascular: Normal flow voids and enhancement. Skull and upper cervical spine: Normal marrow signal and enhancement. Sinuses/Orbits: Unremarkable. Other: None. IMPRESSION: 1. No evidence of intracranial metastatic disease. 2. Interval development of late subacute infarcts in the right putamen/corona radiata and right centrum semiovale. Electronically Signed   By: Emmit Alexanders M.D.   On: 01/16/2023 13:57   VAS [place] LOWER EXTREMITY ARTERIAL DUPLEX  Result Date: 01/08/2023 LOWER EXTREMITY ARTERIAL DUPLEX STUDY Patient Name:  Melissa AYLER  Date of Exam:   01/07/2023 Medical Rec #: [id number]     Accession #:    [id number] Date of Birth: 06/06/1986      Patient Gender: F Patient Age:   4 years Exam Location:  [facility] Procedure:      VAS [place] LOWER EXTREMITY ARTERIAL DUPLEX Referring Phys: Jamelle Haring --------------------------------------------------------------------------------  Indications: Peripheral artery disease. Other Factors: Adenocarcinoma of left lung.  Vascular Interventions: 11/27/22 Mechanical thrombectomy of left profunda femoral,                         popliteal artery and left ATA. Current ABI:            Right ABI 0.84 left 0.70 Performing Technologist: Alvia Grove RVT  Examination Guidelines: A complete evaluation includes  B-mode imaging, spectral Doppler, color Doppler, and power Doppler as needed of all accessible portions of each vessel. Bilateral testing is considered an integral part of a complete examination. Limited examinations for reoccurring indications may be performed as noted.  +-----------+--------+-----+--------+----------+-------------------------------+ RIGHT      PSV cm/sRatioStenosisWaveform  Comments                        +-----------+--------+-----+--------+----------+-------------------------------+ EIA Distal 165                  triphasic                                 +-----------+--------+-----+--------+----------+-------------------------------+ CFA Prox   166                  triphasic                                 +-----------+--------+-----+--------+----------+-------------------------------+ CFA Distal 146                  triphasic                                 +-----------+--------+-----+--------+----------+-------------------------------+ DFA        220                  triphasic                                 +-----------+--------+-----+--------+----------+-------------------------------+  SFA Prox   116                  triphasic                                 +-----------+--------+-----+--------+----------+-------------------------------+ SFA Mid    113                  triphasic                                 +-----------+--------+-----+--------+----------+-------------------------------+ SFA Distal 60                   triphasic                                 +-----------+--------+-----+--------+----------+-------------------------------+ POP Prox   47                   triphasic                                 +-----------+--------+-----+--------+----------+-------------------------------+ POP Mid    38                   triphasic                                  +-----------+--------+-----+--------+----------+-------------------------------+ POP Distal 29 /0                          recanalized plaque, colaterals                                            , occlusion                     +-----------+--------+-----+--------+----------+-------------------------------+ ATA Distal 15                   monophasic                                +-----------+--------+-----+--------+----------+-------------------------------+ PTA Distal 33                   biphasic                                  +-----------+--------+-----+--------+----------+-------------------------------+ PERO Distal16                   monophasic                                +-----------+--------+-----+--------+----------+-------------------------------+  +-----------+--------+-----+--------+----------+-------------------+ LEFT       PSV cm/sRatioStenosisWaveform  Comments            +-----------+--------+-----+--------+----------+-------------------+ EIA Distal 188                  triphasic                     +-----------+--------+-----+--------+----------+-------------------+  CFA Prox   177                  triphasic                     +-----------+--------+-----+--------+----------+-------------------+ CFA Distal 151                  triphasic                     +-----------+--------+-----+--------+----------+-------------------+ DFA        135                  triphasic                     +-----------+--------+-----+--------+----------+-------------------+ SFA Prox   145                  triphasic                     +-----------+--------+-----+--------+----------+-------------------+ SFA Mid    136                  triphasic                     +-----------+--------+-----+--------+----------+-------------------+ SFA Distal 60                   triphasic                      +-----------+--------+-----+--------+----------+-------------------+ POP Prox   49                   triphasic                     +-----------+--------+-----+--------+----------+-------------------+ POP Mid    29           occludedtriphasic dampened, occlusion +-----------+--------+-----+--------+----------+-------------------+ ATA Distal 19                   monophasic                    +-----------+--------+-----+--------+----------+-------------------+ PTA Distal 34                   triphasic                     +-----------+--------+-----+--------+----------+-------------------+ PERO Distal11                   monophasicdampened            +-----------+--------+-----+--------+----------+-------------------+  Summary: Right: Total occlusion noted in the popliteal artery. Left: Total occlusion noted in the popliteal artery.  See table(s) above for measurements and observations. Electronically signed by Jamelle Haring on 01/08/2023 at 8:35:05 AM.    Final    VAS [place] ABI WITH/WO TBI  Result Date: 01/08/2023  LOWER EXTREMITY DOPPLER STUDY Patient Name:  Melissa Pineda  Date of Exam:   01/07/2023 Medical Rec #: [id number]     Accession #:    [id number] Date of Birth: [birth year]      Patient Gender: F Patient Age:   78 years Exam Location:  [facility] Procedure:      VAS [place] ABI WITH/WO TBI Referring Phys: Jamelle Haring --------------------------------------------------------------------------------  Indications: Peripheral artery disease. Other Factors: Adenocarcinoma of left lung.  Vascular Interventions: 11/27/22 Mechanical thrombectomy of left profunda femoral,  popliteal artery and left ATA. Performing Technologist: Alvia Grove RVT  Examination Guidelines: A complete evaluation includes at minimum, Doppler waveform signals and systolic blood pressure reading at the level of bilateral brachial, anterior tibial, and posterior tibial arteries,  when vessel segments are accessible. Bilateral testing is considered an integral part of a complete examination. Photoelectric Plethysmograph (PPG) waveforms and toe systolic pressure readings are included as required and additional duplex testing as needed. Limited examinations for reoccurring indications may be performed as noted.  ABI Findings: +---------+------------------+-----+----------+--------+ Right    Rt Pressure (mmHg)IndexWaveform  Comment  +---------+------------------+-----+----------+--------+ Brachial 116                                       +---------+------------------+-----+----------+--------+ PTA      98                0.84 monophasicbrisk    +---------+------------------+-----+----------+--------+ DP       90                0.78 monophasicbrisk    +---------+------------------+-----+----------+--------+ Great Toe56                0.48 Abnormal           +---------+------------------+-----+----------+--------+ +---------+------------------+-----+----------+-------+ Left     Lt Pressure (mmHg)IndexWaveform  Comment +---------+------------------+-----+----------+-------+ Brachial 113                                      +---------+------------------+-----+----------+-------+ PTA      81                0.70 biphasic          +---------+------------------+-----+----------+-------+ DP       74                0.64 monophasicbrisk   +---------+------------------+-----+----------+-------+ Great Toe63                0.54 Abnormal          +---------+------------------+-----+----------+-------+ +-------+-----------+-----------+------------+------------+ ABI/TBIToday's ABIToday's TBIPrevious ABIPrevious TBI +-------+-----------+-----------+------------+------------+ Right  0.84       0.48       0.59        0.50         +-------+-----------+-----------+------------+------------+ Left   0.70       0.54       0.36        0             +-------+-----------+-----------+------------+------------+  Right ABIs appear increased. Left ABIs and TBIs appear increased.  Summary: Right: Resting right ankle-brachial index indicates mild right lower extremity arterial disease. The right toe-brachial index is abnormal. Left: Resting left ankle-brachial index indicates moderate left lower extremity arterial disease. The left toe-brachial index is abnormal. *See table(s) above for measurements and observations.  Electronically signed by Jamelle Haring on 01/08/2023 at 8:34:48 AM.    Final    CT Angio Chest Pulmonary Embolism (PE) W or WO Contrast  Result Date: 01/01/2023 CLINICAL DATA:  Stage IV left lung adenocarcinoma, short of breath for 4 days, history of DVT EXAM: CT ANGIOGRAPHY CHEST WITH CONTRAST TECHNIQUE: Multidetector CT imaging of the chest was performed using the standard protocol during bolus administration of intravenous contrast. Multiplanar CT image reconstructions and MIPs were obtained to evaluate the vascular anatomy. RADIATION DOSE REDUCTION: This exam was  performed according to the departmental dose-optimization program which includes automated exposure control, adjustment of the mA and/or kV according to patient size and/or use of iterative reconstruction technique. CONTRAST:  45mL OMNIPAQUE IOHEXOL 350 MG/ML SOLN COMPARISON:  12/13/2022, 11/28/2022 FINDINGS: Cardiovascular: This is a technically adequate evaluation of the pulmonary vasculature. No filling defects or pulmonary emboli. The heart is unremarkable without pericardial effusion. The left atrium mural thrombus seen on prior exam is no longer identified. No intraluminal thrombus seen within the cardiac chambers. No evidence of thoracic aortic aneurysm or dissection. Mediastinum/Nodes: The mediastinal and left hilar adenopathy seen on prior exam demonstrates significant improvement in the interim. There is no discrete measurable lymph node evident on this study, with decreased  soft tissue prominence surrounding the trachea, within the AP window, and within the left hilum since prior study. The thyroid, trachea, and esophagus are unremarkable. Lungs/Pleura: There has been significant improvement in the masslike consolidation of the left lower lobe seen previously, consistent with known lung cancer. On today's exam, the greatest area of residual consolidation measures 2.9 x 2.4 cm reference image 67/5, where this had previously measured up to 5.9 x 4.6 cm. Peripheral consolidation within the left lower lobe may reflect postobstructive change. Nodularity seen within the right middle lobe on prior study has resolved in the interim. There is decreased partially loculated left pleural effusion, with only trace residual fluid seen at the posterior costophrenic angle. The pleural drainage catheter is seen on prior studies has been removed in the interim. No new areas of airspace disease. No pneumothorax. Central airways are patent. Upper Abdomen: No acute abnormality. Musculoskeletal: No acute or destructive bony lesions. Reconstructed images demonstrate no additional findings. Review of the MIP images confirms the above findings. IMPRESSION: 1. No evidence of pulmonary embolus. 2. Overall positive response to therapy, with marked decrease in the left lower lobe mass, diminished left pleural effusion and pleural thickening, markedly diminished mediastinal and hilar adenopathy, and resolution of the right middle lobe nodule seen previously. 3. Patchy peripheral consolidation within the left lower lobe, which could reflect postobstructive change or pneumonia. 4. Resolution of the left atrial thrombus seen on prior exam. Electronically Signed   By: Randa Ngo M.D.   On: 01/01/2023 18:07     Assessment/Plan Cerebrovascular accident (CVA) due to embolism of right middle cerebral artery  Andreyah Choquette presents with clinical and radiographic syndrome consistent with multiple right hemispheric  infarcts.  Strokes are secondary to known large left atrial thrombus.  Recent CT angio demonstrated resolution of thrombus, at least on CT window.  She is not interested in transesophageal echocardiogram, and findings wouldn't likely change management at this time.  We recommended continuing Xarelto, Aspirin, Plavix in the near term.  Long term anticoagulation will be recommended, anti-platelet drugs could probably be withdrawn over the coming months if no further thromboembolic events occur.    It would be reasonable to repeat the contrast enhanced MRI brain in 2-3 months to ensure none of the foci of DWI signal abnormality are small metastases.    We spent twenty additional minutes teaching regarding the natural history, biology, and historical experience in the treatment of neurologic complications of cancer.   We appreciate the opportunity to participate in the care of Melissa Pineda.  She will follow up with [place] after the next MRI study.  Will con't to follow with vascular surgery, medical oncology.  All questions were answered. The patient knows to call the clinic with any problems, questions or concerns.  No barriers to learning were detected.  The total time spent in the encounter was 40 minutes and more than 50% was on counseling and review of test results   Ventura Sellers, MD Medical Director of Neuro-Oncology [facility] [place] 01/21/23 4:31 PM

## 2023-01-23 ENCOUNTER — Telehealth: Payer: Self-pay | Admitting: Internal Medicine

## 2023-01-23 NOTE — Telephone Encounter (Signed)
Reached out to patient per 4/2 LOS left voicemail.

## 2023-01-27 ENCOUNTER — Inpatient Hospital Stay: Payer: BC Managed Care – PPO | Admitting: Licensed Clinical Social Worker

## 2023-01-27 ENCOUNTER — Telehealth: Payer: Self-pay

## 2023-01-27 DIAGNOSIS — C3492 Malignant neoplasm of unspecified part of left bronchus or lung: Secondary | ICD-10-CM

## 2023-01-27 DIAGNOSIS — F411 Generalized anxiety disorder: Secondary | ICD-10-CM | POA: Diagnosis not present

## 2023-01-27 NOTE — Telephone Encounter (Signed)
Caller: Patient  Concern: large bruise with a few lumps underneath the bruise  Location: UE  Description: sudden, since yesterday, pt was gardening and placed a shovel under her arm to hold  Treatments: none  Resolution: Instructed to call back if symptoms perist and use ice/heat to relieve any swelling. Confirmed understanding.

## 2023-01-27 NOTE — Progress Notes (Signed)
[facility] CSW Progress Note  Visual merchandiser  spoke w/ pt regarding questions about applying for disability.  Pt encouraged to reach out directly to DSS regarding the amount she could expect to receive.  Continual support to be provided by CSW as appropriate throughout duration of treatment.        Rachel Moulds, LCSW

## 2023-01-28 NOTE — Telephone Encounter (Signed)
Oral Oncology Patient Advocate Encounter  Called Elvera Maria, Senior ARM at [facility] Access Support to follow up on correspondence regarding next steps for patient.   Left a voicemail at [phone]. Will continue to look for a return call or follow up via email before deciding how to proceed in order for patient to maintain access to medication.  Jinger Neighbors, CPhT-Adv Oncology Pharmacy Patient Advocate [facility] Direct Number: [phone]  Fax: [phone]

## 2023-01-29 ENCOUNTER — Telehealth: Payer: Self-pay | Admitting: Medical Oncology

## 2023-01-29 NOTE — Telephone Encounter (Addendum)
Work letter emailed to pt

## 2023-01-30 ENCOUNTER — Telehealth: Payer: Self-pay | Admitting: Internal Medicine

## 2023-01-30 NOTE — Telephone Encounter (Signed)
Called patient regarding April appointments, patient is notified. 

## 2023-02-03 NOTE — Telephone Encounter (Signed)
Oral Oncology Patient Advocate Encounter  Received call from Stacy at [facility] Access Support regarding an update to the application.  Per Kennyth Arnold, the application was triaged back to [employer] on 01/31/23. The application went back across with "additional documentation" provided by Kennyth Arnold to a direct team at [facility]. This documentation highlights the background on what is happening with the patient's insurance plan that had previously caused a denial.  I was advised by Kennyth Arnold to "hold tight" in regards to sending any additional documentation over to [facility] for the time being.  Jinger Neighbors, CPhT-Adv Oncology Pharmacy Patient Advocate [facility] Direct Number: [phone]  Fax: [phone]

## 2023-02-07 NOTE — Telephone Encounter (Signed)
Oral Oncology Patient Advocate Encounter  Called to check status of application at [facility]. Was notified that additional information is needed.  Denial letter from [employer] and [facility] sent to [facility] at [phone] via e-fax.   I will continue to follow.  Jinger Neighbors, CPhT-Adv Oncology Pharmacy Patient Advocate [facility] Direct Number: [phone]  Fax: [phone]

## 2023-02-12 NOTE — Telephone Encounter (Signed)
Oral Oncology Patient Advocate Encounter   Received notification that the application for assistance for Augtyro through [employer] has been approved.   [employer] phone number [phone].   Effective dates: 02/10/23 through 02/09/24  I have spoken to the patient.  Jinger Neighbors, CPhT-Adv Oncology Pharmacy Patient Advocate [facility] Direct Number: [phone]  Fax: [phone]

## 2023-02-13 ENCOUNTER — Inpatient Hospital Stay: Payer: BC Managed Care – PPO | Admitting: Internal Medicine

## 2023-02-13 ENCOUNTER — Inpatient Hospital Stay: Payer: BC Managed Care – PPO

## 2023-02-17 NOTE — Progress Notes (Unsigned)
Palliative Medicine [facility]  [phone] [phone]   Name: Melissa Pineda Date: 02/17/2023 MRN: [id number]  DOB: 25-Jun-1986  Patient Care Team: Patient, No Pcp Per as PCP - General (General Practice) Edsel Petrin, DO as Consulting Physician (Hospice and Palliative Medicine)    INTERVAL HISTORY: Melissa Pineda is a 37 y.o. female with  oncologic medical history including newly diagnosed ROS1 Non-Small Cell Lung Cancer, stage IV at time of diagnosis s/p pleurx for malignant pleural effusion and known diffuse metastatic lymphadenopathy and osseous mets (10/2022). Currently on Repotrectinib and tolerating well. Recently underwent thrombectomy due to bilateral lower extremity arterial occlusion per Dr.Hawkens.  Patient is on Plavix, aspirin, Xarelto.  Palliative ask to see for symptom management and goals of care.   SOCIAL HISTORY:    [name] reports that she has never smoked. She has never used smokeless tobacco. She reports current alcohol use of about 7.0 standard drinks of alcohol per week. She reports current drug use. Drug: Marijuana.  ADVANCE DIRECTIVES:  None on file  CODE STATUS: Full code  PAST MEDICAL HISTORY: Past Medical History:  Diagnosis Date   Anxiety    Phreesia 11/21/2020   Cancer (HCC)     ALLERGIES:  is allergic to hydrocodone, tramadol, and iodinated contrast media.  MEDICATIONS:  Current Outpatient Medications  Medication Sig Dispense Refill   acetaminophen (TYLENOL) 325 MG tablet Take 650 mg by mouth every 6 (six) hours as needed for mild pain (for mild pain.).     ALPRAZolam (XANAX) 0.5 MG tablet Take 1 tablet (0.5 mg total) by mouth 3 (three) times daily as needed for anxiety. 60 tablet 0   aspirin EC 81 MG tablet Take 1 tablet (81 mg total) by mouth daily. Swallow whole. 30 tablet 12   Cholecalciferol (VITAMIN D-3) 125 MCG (5000 UT) TABS Take 1 tablet by mouth daily.     clopidogrel (PLAVIX) 75 MG tablet  Take 1 tablet (75 mg total) by mouth daily. 30 tablet 11   Ferrous Sulfate (IRON HIGH-POTENCY) 142 (45 Fe) MG TBCR Take 142 mg by mouth daily.     levonorgestrel (MIRENA, 52 MG,) 20 MCG/DAY IUD 1 each by Intrauterine route once.     Magnesium Oxide -Mg Supplement 400 MG CAPS Take 400 mg by mouth 2 (two) times daily. 60 capsule 0   metoCLOPramide (REGLAN) 5 MG tablet Take 1 tablet (5 mg total) by mouth every 6 (six) hours as needed for nausea. 30 tablet 1   ondansetron (ZOFRAN) 8 MG tablet Take 1 tablet (8 mg total) by mouth every 8 (eight) hours as needed for nausea or vomiting. 20 tablet 0   ondansetron (ZOFRAN-ODT) 8 MG disintegrating tablet Take 1 tablet (8 mg total) by mouth every 8 (eight) hours as needed for nausea or vomiting. 20 tablet 1   Ondansetron 4 MG FILM Take 4-8 mg by mouth every 6 (six) hours as needed. 15 each 0   OVER THE COUNTER MEDICATION Take 2 tablets by mouth daily. Beet root     oxyCODONE (ROXICODONE) 5 MG immediate release tablet Take 1 tablet (5 mg total) by mouth every 4 (four) hours as needed for severe pain. (Patient taking differently: Take 5 mg by mouth every 6 (six) hours as needed for severe pain.) 120 tablet 0   pantoprazole (PROTONIX) 40 MG tablet Take 1 tablet (40 mg total) by mouth daily. 30 tablet 3   polyethylene glycol (MIRALAX / GLYCOLAX) 17 g packet Take 17  g by mouth daily.     predniSONE (DELTASONE) 50 MG tablet Take 50 mg by mouth as directed. Take one tablet 13 hours , 1 tablet 7 hours and 1 tablet 1 hour prior to CT scan with IV contrast.     Repotrectinib 40 MG CAPS Take 160 mg by mouth daily. (Patient taking differently: Take 160 mg by mouth 2 (two) times daily.) 56 capsule 0   rivaroxaban (XARELTO) 20 MG TABS tablet Take 1 tablet (20 mg total) by mouth daily with supper. 30 tablet 3   Turmeric (QC TUMERIC COMPLEX PO) Take 500 mg by mouth daily.     zinc sulfate 220 (50 Zn) MG capsule Take 220 mg by mouth daily.     Current Facility-Administered  Medications  Medication Dose Route Frequency Provider Last Rate Last Admin   famotidine (PEPCID) tablet 20 mg  20 mg Oral Once Anderson Malta L, DO        VITAL SIGNS: There were no vitals taken for this visit. There were no vitals filed for this visit.  Estimated body mass index is 24.52 kg/m as calculated from the following:   Height as of 01/07/23: 5\' 6"  (1.676 m).   Weight as of an earlier encounter on 02/18/23: 68.9 kg.   PERFORMANCE STATUS (ECOG) : 1 - Symptomatic but completely ambulatory   Physical Exam General: NAD Cardiovascular: regular rate and rhythm Pulmonary: normal breathing pattern  Neurological: alert, oriented x 4, appropriate mood and affect  IMPRESSION: [name] presents to visit today following visit with oncology. Denies pain, nausea, vomiting, or bowel issues.   Fatigue [name] reports that, while all of her other symptoms have resolved, she is trying to cope with a lack of energy. She recently went on a vacation and found it to be taxing. She is working to give herself grace and pace activities, even if that means rearranging her day.   In collaboration with Dr. Arbutus Ped, recommend patient take her iron supplement twice daily. Patient has 25 mg formulation at home. Education provided to take with Vitamin C or orange juice to boost the iron's absorption. Also, begin Vitamin B12 supplement daily.  2.  Goals of Care [name] is hopeful to return to work May 15th. She is also hopeful to resume regular activities such as dental visits. Vincent Peyer, RN for Dr. Arbutus Ped alerted to this and will coordinate documentation needed by dentist for patient to return.  We discussed the importance of continued conversation with family and their medical providers regarding overall plan of care and treatment options, ensuring decisions are within the context of the patients values and GOCs.  PLAN: Ongoing goals of care and symptom management support Continue Xanax as  needed.   Increase Iron to twice daily. Take with Vitamin C or orange juice. Begin Vitamin B12 supplement daily. Support patient with return to dentist, oncology aware and to coordinate release. Palliative will plan to see patient back in 6-8 weeks in collaboration to other oncology appointments.   Patient expressed understanding and was in agreement with this plan. She also understands that she can call the clinic at any time with any questions, concerns, or complaints.   Any controlled substances utilized were prescribed in the context of palliative care. PDMP has been reviewed.   I assessed patient with Marylene Land, NP Student. Agree with above findings.   Visit consisted of counseling and education dealing with the complex and emotionally intense issues of symptom management and palliative care in the setting  of serious and potentially life-threatening illness.Greater than 50%  of this time was spent counseling and coordinating care related to the above assessment and plan.  Signed by: Katy Apo, RN MSN [facility] / NP Student   Willette Alma, AGPCNP-BC  Palliative Medicine Team/[facility]  *Please note that this is a verbal dictation therefore any spelling or grammatical errors are due to the "Dragon Medical One" system interpretation.

## 2023-02-18 ENCOUNTER — Encounter: Payer: Self-pay | Admitting: Medical Oncology

## 2023-02-18 ENCOUNTER — Other Ambulatory Visit: Payer: Self-pay

## 2023-02-18 ENCOUNTER — Inpatient Hospital Stay (HOSPITAL_BASED_OUTPATIENT_CLINIC_OR_DEPARTMENT_OTHER): Payer: BC Managed Care – PPO | Admitting: Internal Medicine

## 2023-02-18 ENCOUNTER — Encounter: Payer: Self-pay | Admitting: Internal Medicine

## 2023-02-18 ENCOUNTER — Inpatient Hospital Stay: Payer: BC Managed Care – PPO

## 2023-02-18 ENCOUNTER — Inpatient Hospital Stay (HOSPITAL_BASED_OUTPATIENT_CLINIC_OR_DEPARTMENT_OTHER): Payer: BC Managed Care – PPO | Admitting: Nurse Practitioner

## 2023-02-18 VITALS — BP 118/63 | HR 76 | Temp 97.2°F | Resp 13 | Wt 151.9 lb

## 2023-02-18 DIAGNOSIS — Z86718 Personal history of other venous thrombosis and embolism: Secondary | ICD-10-CM | POA: Diagnosis not present

## 2023-02-18 DIAGNOSIS — Z7901 Long term (current) use of anticoagulants: Secondary | ICD-10-CM | POA: Diagnosis not present

## 2023-02-18 DIAGNOSIS — Z7902 Long term (current) use of antithrombotics/antiplatelets: Secondary | ICD-10-CM | POA: Diagnosis not present

## 2023-02-18 DIAGNOSIS — C3492 Malignant neoplasm of unspecified part of left bronchus or lung: Secondary | ICD-10-CM

## 2023-02-18 DIAGNOSIS — Z8673 Personal history of transient ischemic attack (TIA), and cerebral infarction without residual deficits: Secondary | ICD-10-CM | POA: Diagnosis not present

## 2023-02-18 DIAGNOSIS — Z79899 Other long term (current) drug therapy: Secondary | ICD-10-CM | POA: Diagnosis not present

## 2023-02-18 DIAGNOSIS — Z793 Long term (current) use of hormonal contraceptives: Secondary | ICD-10-CM | POA: Diagnosis not present

## 2023-02-18 DIAGNOSIS — Z7952 Long term (current) use of systemic steroids: Secondary | ICD-10-CM | POA: Diagnosis not present

## 2023-02-18 DIAGNOSIS — Z515 Encounter for palliative care: Secondary | ICD-10-CM

## 2023-02-18 DIAGNOSIS — Z4682 Encounter for fitting and adjustment of non-vascular catheter: Secondary | ICD-10-CM | POA: Diagnosis not present

## 2023-02-18 DIAGNOSIS — C3490 Malignant neoplasm of unspecified part of unspecified bronchus or lung: Secondary | ICD-10-CM | POA: Diagnosis not present

## 2023-02-18 DIAGNOSIS — D649 Anemia, unspecified: Secondary | ICD-10-CM | POA: Diagnosis not present

## 2023-02-18 DIAGNOSIS — F419 Anxiety disorder, unspecified: Secondary | ICD-10-CM | POA: Diagnosis not present

## 2023-02-18 DIAGNOSIS — C7951 Secondary malignant neoplasm of bone: Secondary | ICD-10-CM | POA: Diagnosis not present

## 2023-02-18 DIAGNOSIS — Z7982 Long term (current) use of aspirin: Secondary | ICD-10-CM | POA: Diagnosis not present

## 2023-02-18 LAB — CBC WITH DIFFERENTIAL (CANCER CENTER ONLY)
Abs Immature Granulocytes: 0.01 10*3/uL (ref 0.00–0.07)
Basophils Absolute: 0 10*3/uL (ref 0.0–0.1)
Basophils Relative: 0 %
Eosinophils Absolute: 0.2 10*3/uL (ref 0.0–0.5)
Eosinophils Relative: 3 %
HCT: 30.1 % — ABNORMAL LOW (ref 36.0–46.0)
Hemoglobin: 9.6 g/dL — ABNORMAL LOW (ref 12.0–15.0)
Immature Granulocytes: 0 %
Lymphocytes Relative: 41 %
Lymphs Abs: 1.9 10*3/uL (ref 0.7–4.0)
MCH: 29.6 pg (ref 26.0–34.0)
MCHC: 31.9 g/dL (ref 30.0–36.0)
MCV: 92.9 fL (ref 80.0–100.0)
Monocytes Absolute: 0.3 10*3/uL (ref 0.1–1.0)
Monocytes Relative: 7 %
Neutro Abs: 2.3 10*3/uL (ref 1.7–7.7)
Neutrophils Relative %: 49 %
Platelet Count: 856 10*3/uL — ABNORMAL HIGH (ref 150–400)
RBC: 3.24 MIL/uL — ABNORMAL LOW (ref 3.87–5.11)
RDW: 13.8 % (ref 11.5–15.5)
WBC Count: 4.7 10*3/uL (ref 4.0–10.5)
nRBC: 0 % (ref 0.0–0.2)

## 2023-02-18 LAB — CMP (CANCER CENTER ONLY)
ALT: 15 U/L (ref 0–44)
AST: 19 U/L (ref 15–41)
Albumin: 4.3 g/dL (ref 3.5–5.0)
Alkaline Phosphatase: 64 U/L (ref 38–126)
Anion gap: 5 (ref 5–15)
BUN: 14 mg/dL (ref 6–20)
CO2: 28 mmol/L (ref 22–32)
Calcium: 9.2 mg/dL (ref 8.9–10.3)
Chloride: 109 mmol/L (ref 98–111)
Creatinine: 0.71 mg/dL (ref 0.44–1.00)
GFR, Estimated: >60 mL/min (ref >60)
Glucose, Bld: 92 mg/dL (ref 70–99)
Potassium: 3.6 mmol/L (ref 3.5–5.1)
Sodium: 142 mmol/L (ref 135–145)
Total Bilirubin: 0.3 mg/dL (ref 0.3–1.2)
Total Protein: 6.8 g/dL (ref 6.5–8.1)

## 2023-02-18 LAB — MAGNESIUM: Magnesium: 2 mg/dL (ref 1.7–2.4)

## 2023-02-18 LAB — URIC ACID: Uric Acid, Serum: 3.2 mg/dL (ref 2.5–7.1)

## 2023-02-18 LAB — PHOSPHORUS: Phosphorus: 3.5 mg/dL (ref 2.5–4.6)

## 2023-02-18 LAB — CK: Total CK: 206 U/L (ref 38–234)

## 2023-02-18 NOTE — Progress Notes (Signed)
[facility] [phone]   [phone]  OFFICE PROGRESS NOTE  Patient, No Pcp Per No address on file  DIAGNOSIS: Stage IVB (T4, N3, M1b) non-small cell lung cancer, adenocarcinoma presented with large left diaphragmatic surface mass in addition to left hilar, infrahilar, AP window, right and left paratracheal, subcarinal as well as left internal mammary lymphadenopathy in addition to right gastric lymph node and solitary Small right frontal calvarial osseous metastatic disease with large malignant left pleural effusion diagnosed in January 2024.    Molecular studies by foundation 1 as well as [employer] blood test showed:   EZR-ROS1 fusion approved by FDA Crizotinib, Entrectinib, Repotrectinib   approved in other indication Ceritinib, Lorlatinib Yes      3.6%   PD-L1 expression by foundation 1 that was 98%.   PRIOR THERAPY: Status post left Pleurx catheter placement for drainage of recurrent left pleural effusion.  This was removed on December 17, 2022.  CURRENT THERAPY: Repotrectinib (Augtyro) 160 mg p.o. daily for the first 2 weeks and then 160 mg p.o. twice daily, first dose November 27, 2022.  She is status post 3 months of treatment  INTERVAL HISTORY: Melissa Pineda 37 y.o. female returns to the clinic today for follow-up visit.  The patient is feeling much better today with no concerning complaints.  The numbness in the chin area has improved for several weeks but returns back few days ago.  She denied having any other neurological symptoms.  She denied having any fever or chills.  She has no nausea, vomiting, diarrhea or constipation.  She has no headache or visual changes.  She denied having any recent weight loss or night sweats.  She has been tolerating her treatment with repotrectinib fairly well.  She is here today for evaluation and repeat blood work.  MEDICAL HISTORY: Past Medical History:  Diagnosis Date   Anxiety    Phreesia  11/21/2020   Cancer (HCC)     ALLERGIES:  is allergic to hydrocodone, tramadol, and iodinated contrast media.  MEDICATIONS:  Current Outpatient Medications  Medication Sig Dispense Refill   acetaminophen (TYLENOL) 325 MG tablet Take 650 mg by mouth every 6 (six) hours as needed for mild pain (for mild pain.).     ALPRAZolam (XANAX) 0.5 MG tablet Take 1 tablet (0.5 mg total) by mouth 3 (three) times daily as needed for anxiety. 60 tablet 0   aspirin EC 81 MG tablet Take 1 tablet (81 mg total) by mouth daily. Swallow whole. 30 tablet 12   Cholecalciferol (VITAMIN D-3) 125 MCG (5000 UT) TABS Take 1 tablet by mouth daily.     clopidogrel (PLAVIX) 75 MG tablet Take 1 tablet (75 mg total) by mouth daily. 30 tablet 11   Ferrous Sulfate (IRON HIGH-POTENCY) 142 (45 Fe) MG TBCR Take 142 mg by mouth daily.     levonorgestrel (MIRENA, 52 MG,) 20 MCG/DAY IUD 1 each by Intrauterine route once.     Magnesium Oxide -Mg Supplement 400 MG CAPS Take 400 mg by mouth 2 (two) times daily. 60 capsule 0   metoCLOPramide (REGLAN) 5 MG tablet Take 1 tablet (5 mg total) by mouth every 6 (six) hours as needed for nausea. 30 tablet 1   ondansetron (ZOFRAN) 8 MG tablet Take 1 tablet (8 mg total) by mouth every 8 (eight) hours as needed for nausea or vomiting. 20 tablet 0   ondansetron (ZOFRAN-ODT) 8 MG disintegrating tablet Take 1 tablet (8 mg total) by mouth every  8 (eight) hours as needed for nausea or vomiting. 20 tablet 1   Ondansetron 4 MG FILM Take 4-8 mg by mouth every 6 (six) hours as needed. 15 each 0   OVER THE COUNTER MEDICATION Take 2 tablets by mouth daily. Beet root     oxyCODONE (ROXICODONE) 5 MG immediate release tablet Take 1 tablet (5 mg total) by mouth every 4 (four) hours as needed for severe pain. (Patient taking differently: Take 5 mg by mouth every 6 (six) hours as needed for severe pain.) 120 tablet 0   pantoprazole (PROTONIX) 40 MG tablet Take 1 tablet (40 mg total) by mouth daily. 30 tablet 3    polyethylene glycol (MIRALAX / GLYCOLAX) 17 g packet Take 17 g by mouth daily.     predniSONE (DELTASONE) 50 MG tablet Take 50 mg by mouth as directed. Take one tablet 13 hours , 1 tablet 7 hours and 1 tablet 1 hour prior to CT scan with IV contrast.     Repotrectinib 40 MG CAPS Take 160 mg by mouth daily. (Patient taking differently: Take 160 mg by mouth 2 (two) times daily.) 56 capsule 0   rivaroxaban (XARELTO) 20 MG TABS tablet Take 1 tablet (20 mg total) by mouth daily with supper. 30 tablet 3   Turmeric (QC TUMERIC COMPLEX PO) Take 500 mg by mouth daily.     zinc sulfate 220 (50 Zn) MG capsule Take 220 mg by mouth daily.     Current Facility-Administered Medications  Medication Dose Route Frequency Provider Last Rate Last Admin   famotidine (PEPCID) tablet 20 mg  20 mg Oral Once Edsel Petrin, DO        SURGICAL HISTORY:  Past Surgical History:  Procedure Laterality Date   ABDOMINAL AORTOGRAM W/LOWER EXTREMITY N/A 11/27/2022   Procedure: ABDOMINAL AORTOGRAM W/LOWER EXTREMITY;  Surgeon: Leonie Douglas, MD;  Location: [facility] INVASIVE CV LAB;  Service: Cardiovascular;  Laterality: N/A;   IR PERC PLEURAL DRAIN W/INDWELL CATH W/IMG GUIDE  11/09/2022   PERIPHERAL VASCULAR ATHERECTOMY  11/27/2022   Procedure: PERIPHERAL VASCULAR ATHERECTOMY;  Surgeon: Leonie Douglas, MD;  Location: [facility] INVASIVE CV LAB;  Service: Cardiovascular;;   PERIPHERAL VASCULAR THROMBECTOMY  11/27/2022   Procedure: PERIPHERAL VASCULAR THROMBECTOMY;  Surgeon: Leonie Douglas, MD;  Location: [facility] INVASIVE CV LAB;  Service: Cardiovascular;;   REMOVAL OF PLEURAL DRAINAGE CATHETER N/A 12/17/2022   Procedure: MINOR REMOVAL OF PLEURAL DRAINAGE CATHETER;  Surgeon: Josephine Igo, DO;  Location: [facility] ENDOSCOPY;  Service: Cardiopulmonary;  Laterality: N/A;   WISDOM TOOTH EXTRACTION      REVIEW OF SYSTEMS:  Constitutional: negative Eyes: negative Ears, nose, mouth, throat, and face: negative Respiratory: negative Cardiovascular:  negative Gastrointestinal: negative Genitourinary:negative Integument/breast: negative Hematologic/lymphatic: negative Musculoskeletal:negative Neurological: negative Behavioral/Psych: negative Endocrine: negative Allergic/Immunologic: negative   PHYSICAL EXAMINATION: General appearance: alert, cooperative, and no distress Head: Normocephalic, without obvious abnormality, atraumatic Neck: no adenopathy, no JVD, supple, symmetrical, trachea midline, and thyroid not enlarged, symmetric, no tenderness/mass/nodules Lymph nodes: Cervical, supraclavicular, and axillary nodes normal. Resp: clear to auscultation bilaterally Back: symmetric, no curvature. ROM normal. No CVA tenderness. Cardio: regular rate and rhythm, S1, S2 normal, no murmur, click, rub or gallop GI: soft, non-tender; bowel sounds normal; no masses,  no organomegaly Extremities: extremities normal, atraumatic, no cyanosis or edema Neurologic: Alert and oriented X 3, normal strength and tone. Normal symmetric reflexes. Normal coordination and gait  ECOG PERFORMANCE STATUS: 1 - Symptomatic but completely ambulatory  Blood pressure 118/63, pulse  76, temperature (!) 97.2 F (36.2 C), temperature source Temporal, resp. rate 13, weight 151 lb 14.4 oz (68.9 kg), SpO2 100 %, not currently breastfeeding.  LABORATORY DATA: Lab Results  Component Value Date   WBC 4.7 02/18/2023   HGB 9.6 (L) 02/18/2023   HCT 30.1 (L) 02/18/2023   MCV 92.9 02/18/2023   PLT 856 (H) 02/18/2023      Chemistry      Component Value Date/Time   NA 142 12/26/2022 0858   NA 140 11/24/2020 1106   K 3.6 12/26/2022 0858   CL 107 12/26/2022 0858   CO2 29 12/26/2022 0858   BUN 12 12/26/2022 0858   BUN 7 11/24/2020 1106   CREATININE 0.67 12/26/2022 0858      Component Value Date/Time   CALCIUM 9.2 12/26/2022 0858   ALKPHOS 84 12/26/2022 0858   AST 16 12/26/2022 0858   ALT 11 12/26/2022 0858   BILITOT 0.4 12/26/2022 0858       RADIOGRAPHIC  STUDIES: No results found.  ASSESSMENT AND PLAN: This is a very pleasant 37 years old white female with  Stage IVB (T4, N3, M1b) non-small cell lung cancer, adenocarcinoma presented with large left diaphragmatic surface mass in addition to left hilar, infrahilar, AP window, right and left paratracheal, subcarinal as well as left internal mammary lymphadenopathy in addition to right gastric lymph node and solitary small right frontal calvarial osseous metastatic disease with large malignant left pleural effusion diagnosed in January 2024.   Fortunately her molecular studies showed positive ROS1 fusion.  The patient also had PD-L1 expression of 98%. The patient is currently undergoing treatment was started therapy with repotrectinib started November 27, 2022.   She has been tolerating this treatment fairly well with no concerning adverse effects. She had repeat blood work performed earlier today that showed persistent anemia with hemoglobin of 9.6 and hematocrit 30.1%.  Her platelets count are still elevated at 856,000.  Will still likely to be reactive in nature secondary to iron deficiency.  I recommended for her to continue with the oral iron tablet with vitamin C or orange juice.  I will continue to monitor her blood work closely. I recommended for the patient to continue her current treatment with repotrectinib with the same dose.  She has been approved by [employer] for another year of medication. For the history of stroke, she is followed by Dr. Barbaraann Cao. For pain management the patient is followed by the palliative care team. I will see her back for follow-up visit in around 6 weeks with repeat CT scan of the chest, abdomen and pelvis for restaging of her disease. The patient would like to return back to work in around 2 weeks and we will provide her with a letter to her work. She was advised to call immediately if she has any other concerning symptoms in the interval. The patient voices understanding of  current disease status and treatment options and is in agreement with the current care plan.  All questions were answered. The patient knows to call the clinic with any problems, questions or concerns. We can certainly see the patient much sooner if necessary.  The total time spent in the appointment was 35 minutes.  Disclaimer: This note was dictated with voice recognition software. Similar sounding words can inadvertently be transcribed and may not be corrected upon review.

## 2023-02-19 ENCOUNTER — Encounter: Payer: Self-pay | Admitting: Nurse Practitioner

## 2023-03-04 ENCOUNTER — Encounter: Payer: Self-pay | Admitting: Internal Medicine

## 2023-03-05 ENCOUNTER — Encounter: Payer: Self-pay | Admitting: Medical Oncology

## 2023-03-26 ENCOUNTER — Ambulatory Visit: Payer: BC Managed Care – PPO | Admitting: Internal Medicine

## 2023-03-26 ENCOUNTER — Other Ambulatory Visit: Payer: BC Managed Care – PPO

## 2023-03-27 ENCOUNTER — Other Ambulatory Visit: Payer: Self-pay | Admitting: Medical Oncology

## 2023-03-27 ENCOUNTER — Other Ambulatory Visit: Payer: Self-pay | Admitting: *Deleted

## 2023-03-27 DIAGNOSIS — C3492 Malignant neoplasm of unspecified part of left bronchus or lung: Secondary | ICD-10-CM

## 2023-03-27 DIAGNOSIS — I63411 Cerebral infarction due to embolism of right middle cerebral artery: Secondary | ICD-10-CM

## 2023-03-27 DIAGNOSIS — T508X5S Adverse effect of diagnostic agents, sequela: Secondary | ICD-10-CM

## 2023-03-27 MED ORDER — PREDNISONE 50 MG PO TABS
50.0000 mg | ORAL_TABLET | ORAL | 5 refills | Status: DC
Start: 2023-03-27 — End: 2024-03-30

## 2023-03-27 NOTE — Progress Notes (Signed)
Prednisone refill called to preferred pharmacy. LVM on pharmacy phone.

## 2023-03-31 NOTE — Progress Notes (Signed)
[facility] OFFICE PROGRESS NOTE  Patient, No Pcp Per No address on file  DIAGNOSIS: Stage IVB (T4, N3, M1b) non-small cell lung cancer, adenocarcinoma presented with large left diaphragmatic surface mass in addition to left hilar, infrahilar, AP window, right and left paratracheal, subcarinal as well as left internal mammary lymphadenopathy in addition to right gastric lymph node and solitary Small right frontal calvarial osseous metastatic disease with large malignant left pleural effusion diagnosed in January 2024.     Molecular studies by [employer] as well as [employer] blood test showed:    EZR-ROS1 fusion approved by FDA Crizotinib, Entrectinib, Repotrectinib   approved in other indication Ceritinib, Lorlatinib Yes      3.6%   PD-L1 expression by [employer] that was 98%.  PRIOR THERAPY: Status post left Pleurx catheter placement for drainage of recurrent left pleural effusion. This was removed on December 17, 2022.   CURRENT THERAPY: Repotrectinib (Augtyro) 160 mg p.o. daily for the first 2 weeks and then 160 mg p.o. twice daily, first dose November 27, 2022.  She is status post 4 months of treatment   INTERVAL HISTORY: Melissa Pineda 37 y.o. female returns to the clinic today for a follow-up visit.  She was last seen by Dr. Arbutus Ped on/3/24.  She is currently undergoing targeted treatment with Augtyro which she is tolerating well overall. However, she does report hair thinning, particularly on the top of her scalp.  Her thyroid was checked in March 2024 which was normal.  She notes this has been occurring for a few months.    Overall she tolerates her treatment well.  She reports her breathing is good without any limitations.  She denies any significant cough although she mentions sometimes she may have an intermittent scratchy throat which she attributes to allergies. Denies any chest pain or hemoptysis.  Denies any pain and she is no longer taking pain  medication.  She is fatigued.  She did start working full-time again.  She is currently on Plavix, aspirin, and Xarelto for history of bilateral lower extremity arterial occlusions status post thrombectomy.  She sees her vascular provider next week.  She denies any abnormal bleeding although she does bruise easily.  She is compliant with her iron supplement.  She denies any nausea, vomiting, constipation, or diarrhea.  Denies any headache or visual changes.  She did mention that she had a period last month where she felt "off" and she felt like she had a lisp.  However her symptoms have resolved at this time.  She does have a repeat brain MRI scheduled next week on 04/07/2023 and follow-up with Dr. Barbaraann Cao few days later. She denies any rashes or skin changes.  She recently had a restaging CT scan performed.  She is here today for evaluation and to review her scan results.   MEDICAL HISTORY: Past Medical History:  Diagnosis Date   Anxiety    [employer] 11/21/2020   Cancer (HCC)     ALLERGIES:  is allergic to hydrocodone, tramadol, and iodinated contrast media.  MEDICATIONS:  Current Outpatient Medications  Medication Sig Dispense Refill   acetaminophen (TYLENOL) 325 MG tablet Take 650 mg by mouth every 6 (six) hours as needed for mild pain (for mild pain.).     ALPRAZolam (XANAX) 0.5 MG tablet Take 1 tablet (0.5 mg total) by mouth 3 (three) times daily as needed for anxiety. 60 tablet 0   aspirin EC 81 MG tablet Take 1 tablet (81 mg total) by mouth  daily. Swallow whole. 30 tablet 12   Cholecalciferol (VITAMIN D-3) 125 MCG (5000 UT) TABS Take 1 tablet by mouth daily.     clopidogrel (PLAVIX) 75 MG tablet Take 1 tablet (75 mg total) by mouth daily. 30 tablet 11   cyanocobalamin 1000 MCG tablet Take 1,000 mcg by mouth daily.     Ferrous Sulfate (IRON HIGH-POTENCY) 142 (45 Fe) MG TBCR Take 142 mg by mouth daily.     levonorgestrel (MIRENA, 52 MG,) 20 MCG/DAY IUD 1 each by Intrauterine route once.      Magnesium Oxide -Mg Supplement 400 MG CAPS Take 400 mg by mouth 2 (two) times daily. 60 capsule 0   metoCLOPramide (REGLAN) 5 MG tablet Take 1 tablet (5 mg total) by mouth every 6 (six) hours as needed for nausea. 30 tablet 1   ondansetron (ZOFRAN) 8 MG tablet Take 1 tablet (8 mg total) by mouth every 8 (eight) hours as needed for nausea or vomiting. 20 tablet 0   ondansetron (ZOFRAN-ODT) 8 MG disintegrating tablet Take 1 tablet (8 mg total) by mouth every 8 (eight) hours as needed for nausea or vomiting. 20 tablet 1   Ondansetron 4 MG FILM Take 4-8 mg by mouth every 6 (six) hours as needed. 15 each 0   oxyCODONE (ROXICODONE) 5 MG immediate release tablet Take 1 tablet (5 mg total) by mouth every 4 (four) hours as needed for severe pain. (Patient taking differently: Take 5 mg by mouth every 6 (six) hours as needed for severe pain.) 120 tablet 0   pantoprazole (PROTONIX) 40 MG tablet Take 1 tablet (40 mg total) by mouth daily. 30 tablet 3   polyethylene glycol (MIRALAX / GLYCOLAX) 17 g packet Take 17 g by mouth daily.     predniSONE (DELTASONE) 50 MG tablet Take 1 tablet (50 mg total) by mouth as directed. Take one tablet 13 hours , 1 tablet 7 hours and 1 tablet 1 hour prior to CT scan with IV contrast.  Take Benadryl 50 mg po 1 hour before CT scan 3 tablet 5   Repotrectinib 40 MG CAPS Take 160 mg by mouth daily. (Patient taking differently: Take 160 mg by mouth 2 (two) times daily.) 56 capsule 0   rivaroxaban (XARELTO) 20 MG TABS tablet Take 1 tablet (20 mg total) by mouth daily with supper. 30 tablet 3   zinc sulfate 220 (50 Zn) MG capsule Take 220 mg by mouth daily.     Current Facility-Administered Medications  Medication Dose Route Frequency Provider Last Rate Last Admin   famotidine (PEPCID) tablet 20 mg  20 mg Oral Once Edsel Petrin, DO        SURGICAL HISTORY:  Past Surgical History:  Procedure Laterality Date   ABDOMINAL AORTOGRAM W/LOWER EXTREMITY N/A 11/27/2022    Procedure: ABDOMINAL AORTOGRAM W/LOWER EXTREMITY;  Surgeon: Leonie Douglas, MD;  Location: [facility];  Service: Cardiovascular;  Laterality: N/A;   IR PERC PLEURAL DRAIN W/INDWELL CATH W/IMG GUIDE  11/09/2022   PERIPHERAL VASCULAR ATHERECTOMY  11/27/2022   Procedure: PERIPHERAL VASCULAR ATHERECTOMY;  Surgeon: Leonie Douglas, MD;  Location: [facility];  Service: Cardiovascular;;   PERIPHERAL VASCULAR THROMBECTOMY  11/27/2022   Procedure: PERIPHERAL VASCULAR THROMBECTOMY;  Surgeon: Leonie Douglas, MD;  Location: [facility];  Service: Cardiovascular;;   REMOVAL OF PLEURAL DRAINAGE CATHETER N/A 12/17/2022   Procedure: MINOR REMOVAL OF PLEURAL DRAINAGE CATHETER;  Surgeon: Josephine Igo, DO;  Location: [facility]OS[facility];  Service:  Cardiopulmonary;  Laterality: N/A;   WISDOM TOOTH EXTRACTION      REVIEW OF SYSTEMS:   Review of Systems  Constitutional: Positive for fatigue. Negative for appetite change, chills, fever and unexpected weight change.  HENT:   Negative for mouth sores, nosebleeds, sore throat and trouble swallowing.   Eyes: Negative for eye problems and icterus.  Respiratory: Negative for cough, hemoptysis, shortness of breath and wheezing.   Cardiovascular: Negative for chest pain and leg swelling.  Gastrointestinal: Negative for abdominal pain, constipation, diarrhea, nausea and vomiting.  Genitourinary: Negative for bladder incontinence, difficulty urinating, dysuria, frequency and hematuria.   Musculoskeletal: Negative for back pain, gait problem, neck pain and neck stiffness.  Skin: Positive for hair thinning. Negative for itching and rash.  Neurological: Negative for dizziness, extremity weakness, gait problem, headaches, light-headedness and seizures.  Hematological: Negative for adenopathy. Does not bruise/bleed easily.  Psychiatric/Behavioral: Negative for confusion, depression and sleep disturbance. The patient is not nervous/anxious.     PHYSICAL  EXAMINATION:  Blood pressure 107/65, pulse 80, temperature 97.7 F (36.5 C), temperature source Oral, resp. rate 18, height 5\' 6"  (1.676 m), weight 157 lb 4.8 oz (71.4 kg), SpO2 100 %, not currently breastfeeding.  ECOG PERFORMANCE STATUS: 1  Physical Exam  Constitutional: Oriented to person, place, and time and well-developed, well-nourished, and in no distress.  HENT:  Head: Positive for alopecia. Normocephalic and atraumatic.  Mouth/Throat: Oropharynx is clear and moist. No oropharyngeal exudate.  Eyes: Conjunctivae are normal. Right eye exhibits no discharge. Left eye exhibits no discharge. No scleral icterus.  Neck: Normal range of motion. Neck supple.  Cardiovascular: Normal rate, regular rhythm, normal heart sounds and intact distal pulses.   Pulmonary/Chest: Effort normal and breath sounds normal. No respiratory distress. No wheezes. No rales.  Abdominal: Soft. Bowel sounds are normal. Exhibits no distension and no mass. There is no tenderness.  Musculoskeletal: Normal range of motion. Exhibits no edema.  Lymphadenopathy:    No cervical adenopathy.  Neurological: Alert and oriented to person, place, and time. Exhibits normal muscle tone. Gait normal. Coordination normal.  Skin: Skin is warm and dry. No rash noted. Not diaphoretic. No erythema. No pallor.  Psychiatric: Mood, memory and judgment normal.  Vitals reviewed.  LABORATORY DATA: Lab Results  Component Value Date   WBC 7.0 04/01/2023   HGB 11.3 (L) 04/01/2023   HCT 33.9 (L) 04/01/2023   MCV 89.7 04/01/2023   PLT 536 (H) 04/01/2023      Chemistry      Component Value Date/Time   NA 140 04/01/2023 1559   NA 140 11/24/2020 1106   K 3.7 04/01/2023 1559   CL 106 04/01/2023 1559   CO2 25 04/01/2023 1559   BUN 16 04/01/2023 1559   BUN 7 11/24/2020 1106   CREATININE 0.86 04/01/2023 1559      Component Value Date/Time   CALCIUM 10.1 04/01/2023 1559   ALKPHOS 70 04/01/2023 1559   AST 20 04/01/2023 1559   ALT 19  04/01/2023 1559   BILITOT 0.3 04/01/2023 1559       RADIOGRAPHIC STUDIES:  CT Abdomen Pelvis W Contrast  Result Date: 04/03/2023 CLINICAL DATA:  Non-small cell lung cancer.  * Tracking Code: BO * EXAM: CT CHEST, ABDOMEN, AND PELVIS WITH CONTRAST TECHNIQUE: Multidetector CT imaging of the chest, abdomen and pelvis was performed following the standard protocol during bolus administration of intravenous contrast. RADIATION DOSE REDUCTION: This exam was performed according to the departmental dose-optimization program which includes automated exposure control,  adjustment of the mA and/or kV according to patient size and/or use of iterative reconstruction technique. CONTRAST:  OMNIPAQUE IOHEXOL 300 MG/ML  SOLN COMPARISON:  03/03/2023 FINDINGS: CT CHEST FINDINGS Cardiovascular: The heart is normal in size. No pericardial effusion. The aorta is normal. The pulmonary arteries are normal. Mediastinum/Nodes: Stable residual thymic tissue in the anterior mediastinum. Small scattered mediastinal hilar lymph nodes with further reduction in size since the recent chest CT and significant improvement from the patient's regional imaging studies suggesting a continued good response to treatment. The esophagus is grossly normal. Lungs/Pleura: Left basilar scarring changes and minimal residual ill-defined nodularity at the left lung base but overall marked improvement in no findings suspicious for recurrent tumor. No new pulmonary nodules to suggest pulmonary metastatic disease. The right lung nodule seen on the PET-CT have resolved. Left basilar pleural thickening but no recurrent pleural effusion. Musculoskeletal: No breast masses, supraclavicular or axillary adenopathy. No bone lesions. CT ABDOMEN PELVIS FINDINGS Hepatobiliary: No hepatic lesions to suggest metastatic disease. Pancreas: Normal Spleen: Normal Adrenals/Urinary Tract: Normal Stomach/Bowel: No significant findings. Vascular/Lymphatic: The aorta is normal  in caliber. No dissection. The branch vessels are patent. The major venous structures are patent. No mesenteric or retroperitoneal mass or adenopathy. No recurrent gastrohepatic ligament node. Small scattered lymph nodes are noted. Reproductive: Retroverted uterus with IUD in place. No adnexal masses. Other: No pelvic mass or adenopathy. No free pelvic fluid collections. No inguinal mass or adenopathy. No abdominal wall hernia or subcutaneous lesions. Musculoskeletal: No significant bony findings. No worrisome bone lesions. IMPRESSION: 1. Stable scarring changes at the left lung base but no findings suspicious for recurrent tumor. No new metastatic pulmonary disease. 2. Small residual mediastinal and hilar lymph nodes but no recurrent adenopathy. 3. No findings for abdominal/pelvic metastatic disease or osseous metastatic disease. 4. Overall findings consistent with a good response to treatment without evidence for recurrent or new metastatic disease. Electronically Signed   By: Rudie Meyer M.D.   On: 04/03/2023 08:07   CT Chest W Contrast  Result Date: 04/03/2023 CLINICAL DATA:  Non-small cell lung cancer.  * Tracking Code: BO * EXAM: CT CHEST, ABDOMEN, AND PELVIS WITH CONTRAST TECHNIQUE: Multidetector CT imaging of the chest, abdomen and pelvis was performed following the standard protocol during bolus administration of intravenous contrast. RADIATION DOSE REDUCTION: This exam was performed according to the departmental dose-optimization program which includes automated exposure control, adjustment of the mA and/or kV according to patient size and/or use of iterative reconstruction technique. CONTRAST:  OMNIPAQUE IOHEXOL 300 MG/ML  SOLN COMPARISON:  03/03/2023 FINDINGS: CT CHEST FINDINGS Cardiovascular: The heart is normal in size. No pericardial effusion. The aorta is normal. The pulmonary arteries are normal. Mediastinum/Nodes: Stable residual thymic tissue in the anterior mediastinum. Small  scattered mediastinal hilar lymph nodes with further reduction in size since the recent chest CT and significant improvement from the patient's regional imaging studies suggesting a continued good response to treatment. The esophagus is grossly normal. Lungs/Pleura: Left basilar scarring changes and minimal residual ill-defined nodularity at the left lung base but overall marked improvement in no findings suspicious for recurrent tumor. No new pulmonary nodules to suggest pulmonary metastatic disease. The right lung nodule seen on the PET-CT have resolved. Left basilar pleural thickening but no recurrent pleural effusion. Musculoskeletal: No breast masses, supraclavicular or axillary adenopathy. No bone lesions. CT ABDOMEN PELVIS FINDINGS Hepatobiliary: No hepatic lesions to suggest metastatic disease. Pancreas: Normal Spleen: Normal Adrenals/Urinary Tract: Normal Stomach/Bowel: No significant findings.  Vascular/Lymphatic: The aorta is normal in caliber. No dissection. The branch vessels are patent. The major venous structures are patent. No mesenteric or retroperitoneal mass or adenopathy. No recurrent gastrohepatic ligament node. Small scattered lymph nodes are noted. Reproductive: Retroverted uterus with IUD in place. No adnexal masses. Other: No pelvic mass or adenopathy. No free pelvic fluid collections. No inguinal mass or adenopathy. No abdominal wall hernia or subcutaneous lesions. Musculoskeletal: No significant bony findings. No worrisome bone lesions. IMPRESSION: 1. Stable scarring changes at the left lung base but no findings suspicious for recurrent tumor. No new metastatic pulmonary disease. 2. Small residual mediastinal and hilar lymph nodes but no recurrent adenopathy. 3. No findings for abdominal/pelvic metastatic disease or osseous metastatic disease. 4. Overall findings consistent with a good response to treatment without evidence for recurrent or new metastatic disease. Electronically Signed    By: Rudie Meyer M.D.   On: 04/03/2023 08:07     ASSESSMENT/PLAN:  This is a very pleasant 37 year old never smoker Caucasian female diagnosed with stage IV (T4, N3, M1 B) non-small cell lung cancer, adenocarcinoma.  She presented with a large left diaphragmatic surface mass in addition to left hilar, infrahilar, AP window, right and left paratracheal, subcarinal, left internal mammary, and right gastric lymphadenopathy in addition to a solitary small right frontal calvarial osseous metastatic lesion with a large malignant left pleural effusion.  She was diagnosed in January 2024.   Her molecular studies show that she has an actionable mutation with ROS1 fusion.  She also has a PD-L1 expression of 98%.  The patient had bilateral lower extremity occlusion and underwent thrombectomy under the care of Dr. Juanetta Gosling.  She is currently on triple therapy with aspirin, Plavix, and Xarelto.   The patient is currently on targeted treatment with repotrectinib initially as 160 mg p.o. daily for 2 weeks followed by increase of the dose to 160 mg p.o. twice daily starting on 12/12/22. Her first dose of treatment was on 11/27/22.  She has been tolerating this well. She has been on this for 4 months.   The patient recently had a restaging CT scan performed.  The patient was seen with Dr. Arbutus Ped today.  Dr. Arbutus Ped personally and independently reviewed the scan and discussed the results with the patient today.  The scan showed complete response with out any evidence of recurrent or new metastatic disease.  Dr. Arbutus Ped explained that while she had complete response to her treatment on imaging studies, that she still has lung cancer and she needs to continue on her treatment.  Dr. Arbutus Ped recommends that she continue on the same treatment at the same dose.  We will see her back for follow-up visit in 6 weeks for evaluation repeat blood work.  For her hair thinning, we will add on thyroid studies and add a lab  appointment next week when she comes in to see Dr. Barbaraann Cao.  If abnormal, we may consider referring her to an endocrinologist.  Dr. Arbutus Ped recommends that she follow-up with her dermatologist and gynecologist.  Alopecia is not listed as a side effect of Augtyro so it is unclear where her hair thinning is coming from.  I sent a MyChart message to the patient clarifying if she needs a referral to gynecology or dermatology where she is established with the practice  She will continue taking her iron supplement.  See her vascular provider next week as scheduled.  She is also scheduled to see Dr. Barbaraann Cao next week with a repeat brain MRI  on 04/07/2023.   The patient was advised to call immediately if she has any concerning symptoms in the interval. The patient voices understanding of current disease status and treatment options and is in agreement with the current care plan. All questions were answered. The patient knows to call the clinic with any problems, questions or concerns. We can certainly see the patient much sooner if necessary      Orders Placed This Encounter  Procedures   TSH    Standing Status:   Future    Standing Expiration Date:   04/02/2024   T3    Standing Status:   Future    Standing Expiration Date:   04/02/2024   T4, free    Standing Status:   Future    Standing Expiration Date:   04/02/2024   Ferritin    Standing Status:   Future    Standing Expiration Date:   04/02/2024   CBC with Differential (Cancer Center Only)    Standing Status:   Future    Standing Expiration Date:   04/02/2024   CMP (Cancer Center only)    Standing Status:   Future    Standing Expiration Date:   04/02/2024   Iron and Iron Binding Capacity ([facility] only)    Standing Status:   Future    Standing Expiration Date:   04/02/2024   Magnesium    Standing Status:   Future    Standing Expiration Date:   04/02/2024   Phosphorus    Standing Status:   Future    Standing Expiration Date:   04/02/2024    Uric acid    Standing Status:   Future    Standing Expiration Date:   04/02/2024   CK, total    Standing Status:   Future    Standing Expiration Date:   04/02/2024   Pregnancy, urine    Standing Status:   Future    Standing Expiration Date:   04/02/2024      Tyge Somers L Thomasena Vandenheuvel, PA-C 04/03/23  ADDENDUM: Hematology/Oncology Attending: I had a face-to-face encounter with the patient today.  I reviewed her records, lab, scan and recommended her care plan.  This is a very pleasant 37 years old white female diagnosed with stage IVb non-small cell lung cancer, adenocarcinoma in January 2024 and molecular studies showed positive EZR-ROS1 fusion and PD-L1 expression of 98%.  The patient is status post left Pleurx catheter placement for drainage of recurrent malignant left pleural effusion that was removed in February 2024.  She has been on treatment with repotrectinib 160 mg p.o. twice daily started in February 2024 status post 4 months of treatment and has been tolerating this treatment fairly well with no concerning adverse effect except for the recent mild alopecia unclear etiology.  She had repeat CT scan of the chest, abdomen and pelvis performed recently.  I personally and independently reviewed the scan and discussed the result with the patient and her mother. Her scan showed almost complete response to this treatment with no clear evidence of residual disease. I recommended for her to continue her current treatment with repotrectinib with the same dose. Regarding the mild alopecia, we checked the adverse effect of repotrectinib and it is not one of the reported adverse effects so far.  I we will check her thyroid function.  We also advised the patient to see her dermatologist as well as gynecologist for recommendation regarding her condition.  If needed we may also refer her to endocrinology for evaluation.  The patient will come back for follow-up visit in 6 weeks for evaluation and repeat  blood work. She was advised to call immediately if she has any other concerning symptoms in the interval. The total time spent in the appointment was 30 minutes. Disclaimer: This note was dictated with voice recognition software. Similar sounding words can inadvertently be transcribed and may be missed upon review. Lajuana Matte, MD

## 2023-04-01 ENCOUNTER — Other Ambulatory Visit: Payer: Self-pay

## 2023-04-01 ENCOUNTER — Inpatient Hospital Stay: Payer: BC Managed Care – PPO | Attending: Internal Medicine

## 2023-04-01 ENCOUNTER — Ambulatory Visit (HOSPITAL_COMMUNITY)
Admission: RE | Admit: 2023-04-01 | Discharge: 2023-04-01 | Disposition: A | Discharge status: Home / Self Care | Payer: BC Managed Care – PPO | Source: Ambulatory Visit | Attending: Internal Medicine | Admitting: Internal Medicine

## 2023-04-01 DIAGNOSIS — Z79899 Other long term (current) drug therapy: Secondary | ICD-10-CM | POA: Insufficient documentation

## 2023-04-01 DIAGNOSIS — Z7902 Long term (current) use of antithrombotics/antiplatelets: Secondary | ICD-10-CM | POA: Insufficient documentation

## 2023-04-01 DIAGNOSIS — C3492 Malignant neoplasm of unspecified part of left bronchus or lung: Secondary | ICD-10-CM | POA: Insufficient documentation

## 2023-04-01 DIAGNOSIS — Z7901 Long term (current) use of anticoagulants: Secondary | ICD-10-CM | POA: Insufficient documentation

## 2023-04-01 DIAGNOSIS — C3490 Malignant neoplasm of unspecified part of unspecified bronchus or lung: Secondary | ICD-10-CM | POA: Insufficient documentation

## 2023-04-01 DIAGNOSIS — C7951 Secondary malignant neoplasm of bone: Secondary | ICD-10-CM | POA: Insufficient documentation

## 2023-04-01 DIAGNOSIS — Z7952 Long term (current) use of systemic steroids: Secondary | ICD-10-CM | POA: Insufficient documentation

## 2023-04-01 DIAGNOSIS — Z7982 Long term (current) use of aspirin: Secondary | ICD-10-CM | POA: Insufficient documentation

## 2023-04-01 DIAGNOSIS — Z793 Long term (current) use of hormonal contraceptives: Secondary | ICD-10-CM | POA: Insufficient documentation

## 2023-04-01 LAB — CBC WITH DIFFERENTIAL (CANCER CENTER ONLY)
Abs Immature Granulocytes: 0.02 10*3/uL (ref 0.00–0.07)
Basophils Absolute: 0 10*3/uL (ref 0.0–0.1)
Basophils Relative: 0 %
Eosinophils Absolute: 0 10*3/uL (ref 0.0–0.5)
Eosinophils Relative: 0 %
HCT: 33.9 % — ABNORMAL LOW (ref 36.0–46.0)
Hemoglobin: 11.3 g/dL — ABNORMAL LOW (ref 12.0–15.0)
Immature Granulocytes: 0 %
Lymphocytes Relative: 9 %
Lymphs Abs: 0.6 10*3/uL — ABNORMAL LOW (ref 0.7–4.0)
MCH: 29.9 pg (ref 26.0–34.0)
MCHC: 33.3 g/dL (ref 30.0–36.0)
MCV: 89.7 fL (ref 80.0–100.0)
Monocytes Absolute: 0.1 10*3/uL (ref 0.1–1.0)
Monocytes Relative: 1 %
Neutro Abs: 6.3 10*3/uL (ref 1.7–7.7)
Neutrophils Relative %: 90 %
Platelet Count: 536 10*3/uL — ABNORMAL HIGH (ref 150–400)
RBC: 3.78 MIL/uL — ABNORMAL LOW (ref 3.87–5.11)
RDW: 13.7 % (ref 11.5–15.5)
WBC Count: 7 10*3/uL (ref 4.0–10.5)
nRBC: 0 % (ref 0.0–0.2)

## 2023-04-01 LAB — CMP (CANCER CENTER ONLY)
ALT: 19 U/L (ref 0–44)
AST: 20 U/L (ref 15–41)
Albumin: 5.2 g/dL — ABNORMAL HIGH (ref 3.5–5.0)
Alkaline Phosphatase: 70 U/L (ref 38–126)
Anion gap: 9 (ref 5–15)
BUN: 16 mg/dL (ref 6–20)
CO2: 25 mmol/L (ref 22–32)
Calcium: 10.1 mg/dL (ref 8.9–10.3)
Chloride: 106 mmol/L (ref 98–111)
Creatinine: 0.86 mg/dL (ref 0.44–1.00)
GFR, Estimated: >60 mL/min (ref >60)
Glucose, Bld: 206 mg/dL — ABNORMAL HIGH (ref 70–99)
Potassium: 3.7 mmol/L (ref 3.5–5.1)
Sodium: 140 mmol/L (ref 135–145)
Total Bilirubin: 0.3 mg/dL (ref 0.3–1.2)
Total Protein: 8.3 g/dL — ABNORMAL HIGH (ref 6.5–8.1)

## 2023-04-01 LAB — PHOSPHORUS: Phosphorus: 3.5 mg/dL (ref 2.5–4.6)

## 2023-04-01 LAB — IRON AND IRON BINDING CAPACITY (CC-WL,HP ONLY)
Iron: 78 ug/dL (ref 28–170)
Saturation Ratios: 19 % (ref 10.4–31.8)
TIBC: 403 ug/dL (ref 250–450)
UIBC: 325 ug/dL (ref 148–442)

## 2023-04-01 LAB — CK: Total CK: 151 U/L (ref 38–234)

## 2023-04-01 LAB — URIC ACID: Uric Acid, Serum: 2.9 mg/dL (ref 2.5–7.1)

## 2023-04-01 LAB — MAGNESIUM: Magnesium: 2 mg/dL (ref 1.7–2.4)

## 2023-04-01 MED ORDER — IOHEXOL 300 MG/ML  SOLN
100.0000 mL | Freq: Once | INTRAMUSCULAR | Status: AC | PRN
  Administered 2023-04-01: 100 mL via INTRAVENOUS

## 2023-04-02 LAB — FERRITIN: Ferritin: 20 ng/mL (ref 11–307)

## 2023-04-03 ENCOUNTER — Other Ambulatory Visit: Payer: Self-pay

## 2023-04-03 ENCOUNTER — Inpatient Hospital Stay (HOSPITAL_BASED_OUTPATIENT_CLINIC_OR_DEPARTMENT_OTHER): Payer: BC Managed Care – PPO | Admitting: Physician Assistant

## 2023-04-03 ENCOUNTER — Other Ambulatory Visit: Payer: BC Managed Care – PPO

## 2023-04-03 ENCOUNTER — Encounter: Payer: Self-pay | Admitting: Physician Assistant

## 2023-04-03 VITALS — BP 107/65 | HR 80 | Temp 97.7°F | Resp 18 | Ht 66.0 in | Wt 157.3 lb

## 2023-04-03 DIAGNOSIS — C7951 Secondary malignant neoplasm of bone: Secondary | ICD-10-CM | POA: Diagnosis not present

## 2023-04-03 DIAGNOSIS — C3492 Malignant neoplasm of unspecified part of left bronchus or lung: Secondary | ICD-10-CM

## 2023-04-03 DIAGNOSIS — L659 Nonscarring hair loss, unspecified: Secondary | ICD-10-CM | POA: Diagnosis not present

## 2023-04-03 DIAGNOSIS — Z7982 Long term (current) use of aspirin: Secondary | ICD-10-CM | POA: Diagnosis not present

## 2023-04-03 DIAGNOSIS — Z7952 Long term (current) use of systemic steroids: Secondary | ICD-10-CM | POA: Diagnosis not present

## 2023-04-03 DIAGNOSIS — Z79899 Other long term (current) drug therapy: Secondary | ICD-10-CM | POA: Diagnosis not present

## 2023-04-03 DIAGNOSIS — Z7901 Long term (current) use of anticoagulants: Secondary | ICD-10-CM | POA: Diagnosis not present

## 2023-04-03 DIAGNOSIS — Z7902 Long term (current) use of antithrombotics/antiplatelets: Secondary | ICD-10-CM | POA: Diagnosis not present

## 2023-04-03 DIAGNOSIS — Z793 Long term (current) use of hormonal contraceptives: Secondary | ICD-10-CM | POA: Diagnosis not present

## 2023-04-07 ENCOUNTER — Ambulatory Visit (HOSPITAL_COMMUNITY)
Admission: RE | Admit: 2023-04-07 | Discharge: 2023-04-07 | Disposition: A | Discharge status: Home / Self Care | Payer: BC Managed Care – PPO | Source: Ambulatory Visit | Attending: Internal Medicine | Admitting: Internal Medicine

## 2023-04-07 DIAGNOSIS — I63411 Cerebral infarction due to embolism of right middle cerebral artery: Secondary | ICD-10-CM | POA: Insufficient documentation

## 2023-04-07 DIAGNOSIS — C3492 Malignant neoplasm of unspecified part of left bronchus or lung: Secondary | ICD-10-CM | POA: Diagnosis not present

## 2023-04-07 DIAGNOSIS — I639 Cerebral infarction, unspecified: Secondary | ICD-10-CM | POA: Diagnosis not present

## 2023-04-07 MED ORDER — GADOBUTROL 1 MMOL/ML IV SOLN
7.0000 mL | Freq: Once | INTRAVENOUS | Status: AC | PRN
  Administered 2023-04-07: 7 mL via INTRAVENOUS

## 2023-04-08 ENCOUNTER — Ambulatory Visit (INDEPENDENT_AMBULATORY_CARE_PROVIDER_SITE_OTHER): Payer: BC Managed Care – PPO | Admitting: Vascular Surgery

## 2023-04-08 ENCOUNTER — Encounter: Payer: Self-pay | Admitting: Vascular Surgery

## 2023-04-08 ENCOUNTER — Ambulatory Visit (HOSPITAL_COMMUNITY)
Admission: RE | Admit: 2023-04-08 | Discharge: 2023-04-08 | Disposition: A | Discharge status: Home / Self Care | Payer: BC Managed Care – PPO | Source: Ambulatory Visit | Attending: Vascular Surgery | Admitting: Vascular Surgery

## 2023-04-08 VITALS — BP 111/72 | HR 75 | Temp 97.3°F | Wt 156.0 lb

## 2023-04-08 DIAGNOSIS — I739 Peripheral vascular disease, unspecified: Secondary | ICD-10-CM | POA: Insufficient documentation

## 2023-04-08 NOTE — Progress Notes (Signed)
[facility] [place]  ASSESSMENT / PLAN: 37 y.o. female with subacute bilateral lower extremity ischemia causing rest pain in the left lower extremity and claudication in the right lower extremity. Likely from cardioembolism or tumor embolism to lower extremities. This was managed with successful endovascular thrombectomy 11/27/22 with resolution of rest pain.  Her claudication type symptoms have resolved. She is walking as much as she likes. I encouraged her to continue to exercise. Given her young age and good health outside of her cancer diagnosis, she has a good prognosis from a peripheral arterial standpoint.   Recommend continued "triple therapy" for anticoagulation.  We will likely taper this therapy as her cancer therapy progresses but would not do this at the present moment.  Will see her again in 3-6 months with repeat noninvasive testing.  CHIEF COMPLAINT: Severe leg pain  HISTORY OF PRESENT ILLNESS: Melissa Pineda is a 37 y.o. female who was urgently added into the clinic today for evaluation of ultrasound findings.  The patient has had an unusual course.  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  She is a never smoker.  She had malignant effusion and required a Pleurx catheter.  She reports worsening leg pain over the past several days.  Prior to this, she describes leg pain like shinsplints.  She reports severe pain about the metatarsals and toes on the left, classic for ischemic rest pain.  She reports cramping discomfort in the right leg, which is bearable to her.  01/06/23: Patient returns to clinic after intervention and beginning immunotherapy.  She is doing much better overall.  She looks much more healthy.  She does describe some cramping discomfort with walking and easy fatigability.  She does not describe rest pain.  Her symptoms  are manageable for her.  Since CT scan of the chest shows good response to immunotherapy.  04/08/23: Patient returns to clinic.  She is doing very well.  She has been told she has a complete response to her directed therapy for adenocarcinoma of the lung.  She is walking is much as she likes.  She has noticed easy bruising.  She has not noticed problematic bleeding.  She does not have menorrhagia.  She seems to be tolerating triple therapy fairly well.  She did suffer a punctate stroke in March 2024.  A small speech deficit has since resolved.  She had a repeat MRI scan of the brain today looking for new infarcts.  I reviewed these findings with her.  I agree with her oncologist, continuing maximal anticoagulant and antiplatelet therapy seems reasonable for the foreseeable future.  VASCULAR SURGICAL HISTORY: none  VASCULAR RISK FACTORS: Negative history of stroke / transient ischemic attack. Negative history of coronary artery disease.  Negative history of diabetes mellitus.  Negative history of smoking. Negative history of hypertension.  Negative history of chronic kidney disease.  Negative history of chronic obstructive pulmonary disease.  FUNCTIONAL STATUS: ECOG performance status: (0) Fully active, able to carry on all predisease performance without restriction Ambulatory status: Ambulatory within the community without limits  Past Medical History:  Diagnosis Date   Anxiety    [employer] 11/21/2020   Cancer [facility])     Past Surgical History:  Procedure Laterality Date   ABDOMINAL AORTOGRAM W/LOWER EXTREMITY N/A 11/27/2022   Procedure: ABDOMINAL AORTOGRAM W/LOWER EXTREMITY;  Surgeon: Leonie Douglas, MD;  Location: [facility];  Service: Cardiovascular;  Laterality: N/A;   IR PERC PLEURAL DRAIN W/INDWELL CATH W/IMG GUIDE  11/09/2022   PERIPHERAL VASCULAR ATHERECTOMY  11/27/2022   Procedure: PERIPHERAL VASCULAR ATHERECTOMY;  Surgeon: Leonie Douglas, MD;  Location: [facility] IN[facility] CV  [facility];  Service: Cardiovascular;;   PERIPHERAL VASCULAR THROMBECTOMY  11/27/2022   Procedure: PERIPHERAL VASCULAR THROMBECTOMY;  Surgeon: Leonie Douglas, MD;  Location: [facility];  Service: Cardiovascular;;   REMOVAL OF PLEURAL DRAINAGE CATHETER N/A 12/17/2022   Procedure: MINOR REMOVAL OF PLEURAL DRAINAGE CATHETER;  Surgeon: Josephine Igo, DO;  Location: [facility] ENDOS[facility];  Service: Cardiopulmonary;  Laterality: N/A;   WISDOM TOOTH EXTRACTION      Family History  Problem Relation Age of Onset   Cancer Mother    Cancer Maternal Grandfather    Cancer Maternal Grandmother    Cancer Paternal Grandfather    Hypertension Paternal Grandmother     Social History   Socioeconomic History   Marital status: Married    Spouse name: Not on file   Number of children: Not on file   Years of education: Not on file   Highest education level: Not on file  Occupational History   Not on file  Tobacco Use   Smoking status: Never   Smokeless tobacco: Never  Vaping Use   Vaping Use: Never used  Substance and Sexual Activity   Alcohol use: Yes    Alcohol/week: 7.0 standard drinks of alcohol    Types: 7 Glasses of wine per week    Comment: 1 glass wine in the evening    Drug use: Yes    Types: Marijuana    Comment: occasional   Sexual activity: Yes    Birth control/protection: I.U.D.  Other Topics Concern   Not on file  Social History Narrative   Not on file   Social Determinants of Health   Financial Resource Strain: Not on file  Food Insecurity: No Food Insecurity (11/26/2022)   Hunger Vital Sign    Worried About Running Out of Food in the Last Year: Never true    Ran Out of Food in the Last Year: Never true  Transportation Needs: No Transportation Needs (11/26/2022)   PRAPARE - Administrator, Civil Service (Medical): No    Lack of Transportation (Non-Medical): No  Physical Activity: Not on file  Stress: Not on file  Social Connections: Not on file  Intimate Partner  Violence: Not At Risk (11/26/2022)   Humiliation, Afraid, Rape, and Kick questionnaire    Fear of Current or Ex-Partner: No    Emotionally Abused: No    Physically Abused: No    Sexually Abused: No    Allergies  Allergen Reactions   Hydrocodone Nausea Only    Dizzy, "crawl out of my skin"   Tramadol Other (See Comments)    "Weird feeling"   Iodinated Contrast Media Hives    Current Outpatient Medications  Medication Sig Dispense Refill   acetaminophen (TYLENOL) 325 MG tablet Take 650 mg by mouth every 6 (six) hours as needed for mild pain (for mild pain.).     ALPRAZolam (XANAX) 0.5 MG tablet Take 1 tablet (0.5 mg total) by mouth 3 (three) times daily as needed for anxiety. 60 tablet 0   aspirin EC 81 MG tablet Take 1 tablet (81 mg total) by mouth daily. Swallow whole. 30 tablet 12   Cholecalciferol (VITAMIN D-3) 125 MCG (5000 UT) TABS Take 1 tablet by  mouth daily.     clopidogrel (PLAVIX) 75 MG tablet Take 1 tablet (75 mg total) by mouth daily. 30 tablet 11   cyanocobalamin 1000 MCG tablet Take 1,000 mcg by mouth daily.     Ferrous Sulfate (IRON HIGH-POTENCY) 142 (45 Fe) MG TBCR Take 142 mg by mouth daily.     levonorgestrel (MIRENA, 52 MG,) 20 MCG/DAY IUD 1 each by Intrauterine route once.     Magnesium Oxide -Mg Supplement 400 MG CAPS Take 400 mg by mouth 2 (two) times daily. 60 capsule 0   metoCLOPramide (REGLAN) 5 MG tablet Take 1 tablet (5 mg total) by mouth every 6 (six) hours as needed for nausea. 30 tablet 1   ondansetron (ZOFRAN) 8 MG tablet Take 1 tablet (8 mg total) by mouth every 8 (eight) hours as needed for nausea or vomiting. 20 tablet 0   ondansetron (ZOFRAN-ODT) 8 MG disintegrating tablet Take 1 tablet (8 mg total) by mouth every 8 (eight) hours as needed for nausea or vomiting. 20 tablet 1   Ondansetron 4 MG FILM Take 4-8 mg by mouth every 6 (six) hours as needed. 15 each 0   oxyCODONE (ROXICODONE) 5 MG immediate release tablet Take 1 tablet (5 mg total) by mouth  every 4 (four) hours as needed for severe pain. (Patient taking differently: Take 5 mg by mouth every 6 (six) hours as needed for severe pain.) 120 tablet 0   pantoprazole (PROTONIX) 40 MG tablet Take 1 tablet (40 mg total) by mouth daily. 30 tablet 3   polyethylene glycol (MIRALAX / GLYCOLAX) 17 g packet Take 17 g by mouth daily.     predniSONE (DELTASONE) 50 MG tablet Take 1 tablet (50 mg total) by mouth as directed. Take one tablet 13 hours , 1 tablet 7 hours and 1 tablet 1 hour prior to CT scan with IV contrast.  Take Benadryl 50 mg po 1 hour before CT scan 3 tablet 5   Repotrectinib 40 MG CAPS Take 160 mg by mouth daily. (Patient taking differently: Take 160 mg by mouth 2 (two) times daily.) 56 capsule 0   rivaroxaban (XARELTO) 20 MG TABS tablet Take 1 tablet (20 mg total) by mouth daily with supper. 30 tablet 3   zinc sulfate 220 (50 Zn) MG capsule Take 220 mg by mouth daily.     Current Facility-Administered Medications  Medication Dose Route Frequency Provider Last Rate Last Admin   famotidine (PEPCID) tablet 20 mg  20 mg Oral Once Anderson Malta L, DO        PHYSICAL EXAM There were no vitals filed for this visit.  Young woman.  Anxious. Mild tachycardia Regular rate and rhythm Bilateral feet are warm No palpable pedal pulses.    PERTINENT LABORATORY AND RADIOLOGIC DATA  Most recent CBC    Latest Ref Rng & Units 04/01/2023    3:59 PM 02/18/2023    7:59 AM 01/15/2023    8:58 AM  CBC  WBC 4.0 - 10.5 K/uL 7.0  4.7  5.3   Hemoglobin 12.0 - 15.0 g/dL 16.1  9.6  9.8   Hematocrit 36.0 - 46.0 % 33.9  30.1  30.1   Platelets 150 - 400 K/uL 536  856  622      Most recent CMP    Latest Ref Rng & Units 04/01/2023    3:59 PM 02/18/2023    7:59 AM 12/26/2022    8:58 AM  CMP  Glucose 70 - 99 mg/dL 096  92  74   BUN 6 - 20 mg/dL 16  14  12    Creatinine 0.44 - 1.00 mg/dL 1.61  0.96  0.45   Sodium 135 - 145 mmol/L 140  142  142   Potassium 3.5 - 5.1 mmol/L 3.7  3.6  3.6    Chloride 98 - 111 mmol/L 106  109  107   CO2 22 - 32 mmol/L 25  28  29    Calcium 8.9 - 10.3 mg/dL 40.9  9.2  9.2   Total Protein 6.5 - 8.1 g/dL 8.3  6.8  7.4   Total Bilirubin 0.3 - 1.2 mg/dL 0.3  0.3  0.4   Alkaline Phos 38 - 126 U/L 70  64  84   AST 15 - 41 U/L 20  19  16    ALT 0 - 44 U/L 19  15  11      Renal function Estimated Creatinine Clearance: 91.5 mL/min (by C-G formula based on SCr of 0.86 mg/dL).  Hgb A1c MFr Bld (%)  Date Value  11/24/2020 5.0    LDL Chol Calc (NIH)  Date Value Ref Range Status  11/24/2020 89 0 - 99 mg/dL Final   LDL Cholesterol  Date Value Ref Range Status  11/28/2022 104 (H) 0 - 99 mg/dL Final    Comment:           Total Cholesterol/HDL:CHD Risk Coronary Heart Disease Risk Table                     Men   Women  1/2 Average Risk   3.4   3.3  Average Risk       5.0   4.4  2 X Average Risk   9.6   7.1  3 X Average Risk  23.4   11.0        Use the calculated Patient Ratio above and the CHD Risk Table to determine the patient's CHD Risk.        ATP III CLASSIFICATION (LDL):  <100     mg/dL   Optimal  811-914  mg/dL   Near or Above                    Optimal  130-159  mg/dL   Borderline  782-956  mg/dL   High  >213     mg/dL   Very High Performed at [facility], [address] [place], [place] [address]      Vascular Imaging:   +-------+-----------+-----------+------------+------------+  ABI/TBIToday's ABIToday's TBIPrevious ABIPrevious TBI  +-------+-----------+-----------+------------+------------+  Right 1.04       0.72       0.84        0.48          +-------+-----------+-----------+------------+------------+  Left  1.03       0.72       0.70        0.54          +-------+-----------+-----------+------------+------------+   Rande Brunt. Lenell Antu, MD FACS [facility] Phone Number: [phone] 04/08/2023 7:43 AM   Total time spent on preparing this encounter  including chart review, data review, collecting history, examining the patient, coordinating care for this patient, 30 minutes.  Portions of this report may have been transcribed using voice recognition software.  Every effort has been made to ensure accuracy; however, inadvertent computerized transcription errors may still be present.

## 2023-04-09 LAB — VAS US ABI WITH/WO TBI
Left ABI: 1.03
Right ABI: 1.04

## 2023-04-10 ENCOUNTER — Inpatient Hospital Stay (HOSPITAL_BASED_OUTPATIENT_CLINIC_OR_DEPARTMENT_OTHER): Payer: BC Managed Care – PPO | Admitting: Internal Medicine

## 2023-04-10 ENCOUNTER — Inpatient Hospital Stay: Payer: BC Managed Care – PPO

## 2023-04-10 ENCOUNTER — Other Ambulatory Visit: Payer: Self-pay

## 2023-04-10 VITALS — BP 126/70 | HR 75 | Temp 98.0°F | Resp 20 | Wt 156.6 lb

## 2023-04-10 DIAGNOSIS — Z7952 Long term (current) use of systemic steroids: Secondary | ICD-10-CM | POA: Diagnosis not present

## 2023-04-10 DIAGNOSIS — Z793 Long term (current) use of hormonal contraceptives: Secondary | ICD-10-CM | POA: Diagnosis not present

## 2023-04-10 DIAGNOSIS — Z7902 Long term (current) use of antithrombotics/antiplatelets: Secondary | ICD-10-CM | POA: Diagnosis not present

## 2023-04-10 DIAGNOSIS — Z7982 Long term (current) use of aspirin: Secondary | ICD-10-CM | POA: Diagnosis not present

## 2023-04-10 DIAGNOSIS — L659 Nonscarring hair loss, unspecified: Secondary | ICD-10-CM

## 2023-04-10 DIAGNOSIS — Z7901 Long term (current) use of anticoagulants: Secondary | ICD-10-CM | POA: Diagnosis not present

## 2023-04-10 DIAGNOSIS — Z79899 Other long term (current) drug therapy: Secondary | ICD-10-CM | POA: Diagnosis not present

## 2023-04-10 DIAGNOSIS — C3492 Malignant neoplasm of unspecified part of left bronchus or lung: Secondary | ICD-10-CM

## 2023-04-10 DIAGNOSIS — I63411 Cerebral infarction due to embolism of right middle cerebral artery: Secondary | ICD-10-CM

## 2023-04-10 DIAGNOSIS — C7951 Secondary malignant neoplasm of bone: Secondary | ICD-10-CM | POA: Diagnosis not present

## 2023-04-10 LAB — TSH: TSH: 2.242 u[IU]/mL (ref 0.350–4.500)

## 2023-04-10 LAB — T4, FREE: Free T4: 0.78 ng/dL (ref 0.61–1.12)

## 2023-04-10 NOTE — Progress Notes (Signed)
[facility] at [facility] [address]  [place], [place] [address] [phone]   Interval Evaluation  Date of Service: 04/10/23 Patient Name: Melissa Pineda Patient MRN: [id number] Patient DOB: Feb 21, 1986 Provider: Henreitta Leber, MD  Identifying Statement:  Melissa Pineda is a 37 y.o. female with Cerebrovascular accident (CVA) due to embolism of right middle cerebral artery (HCC)   Primary Cancer:  Oncologic History: Oncology History  Adenocarcinoma of left lung, stage 4 (HCC)  11/07/2022 Initial Diagnosis   Adenocarcinoma of left lung, stage 4 (HCC)   11/20/2022 - 11/20/2022 Chemotherapy   Patient is on Treatment Plan : LUNG Pemetrexed + Carboplatin + Bevacizumab q21d        Interval History: Melissa Pineda presents today for follow up after recent MRI brain.  She describes no new or progressive symptoms.  No recurrence of stroke symptoms.  Doing well with lung cancer treatments.  Continues on the xarelto, plavix, aspirin.  H+P (01/21/23) Patient presents to review recent neurologic symptoms.  She describes several days history of slurred speech, dysarticulation, starting (per patient) on March 18th.  Also experienced some brief numbness and/or tingling of the right lower face during this time.  Following three days, she was left with some residual slurring of certain consonants, mild overall.  No other neurologic symptoms since that time.  She has been dosing Xarelto, Aspirin and Plavix for left atrial thrombus diagnosed on 11/28/22.  Doing well on oral TKI with Dr. Arbutus Ped for NSCLC, also following with [facility] oncology.  Medications: Current Outpatient Medications on File Prior to Visit  Medication Sig Dispense Refill   acetaminophen (TYLENOL) 325 MG tablet Take 650 mg by mouth every 6 (six) hours as needed for mild pain (for mild pain.).     ALPRAZolam (XANAX) 0.5 MG tablet Take 1 tablet (0.5 mg total) by mouth 3 (three) times daily as needed for anxiety. 60  tablet 0   aspirin EC 81 MG tablet Take 1 tablet (81 mg total) by mouth daily. Swallow whole. 30 tablet 12   Cholecalciferol (VITAMIN D-3) 125 MCG (5000 UT) TABS Take 1 tablet by mouth daily.     clopidogrel (PLAVIX) 75 MG tablet Take 1 tablet (75 mg total) by mouth daily. 30 tablet 11   cyanocobalamin 1000 MCG tablet Take 1,000 mcg by mouth daily.     Ferrous Sulfate (IRON HIGH-POTENCY) 142 (45 Fe) MG TBCR Take 142 mg by mouth daily.     levonorgestrel (MIRENA, 52 MG,) 20 MCG/DAY IUD 1 each by Intrauterine route once.     Magnesium Oxide -Mg Supplement 400 MG CAPS Take 400 mg by mouth 2 (two) times daily. 60 capsule 0   metoCLOPramide (REGLAN) 5 MG tablet Take 1 tablet (5 mg total) by mouth every 6 (six) hours as needed for nausea. 30 tablet 1   ondansetron (ZOFRAN) 8 MG tablet Take 1 tablet (8 mg total) by mouth every 8 (eight) hours as needed for nausea or vomiting. 20 tablet 0   ondansetron (ZOFRAN-ODT) 8 MG disintegrating tablet Take 1 tablet (8 mg total) by mouth every 8 (eight) hours as needed for nausea or vomiting. 20 tablet 1   Ondansetron 4 MG FILM Take 4-8 mg by mouth every 6 (six) hours as needed. 15 each 0   oxyCODONE (ROXICODONE) 5 MG immediate release tablet Take 1 tablet (5 mg total) by mouth every 4 (four) hours as needed for severe pain. (Patient taking differently: Take 5 mg by mouth every 6 (six)  hours as needed for severe pain.) 120 tablet 0   pantoprazole (PROTONIX) 40 MG tablet Take 1 tablet (40 mg total) by mouth daily. 30 tablet 3   polyethylene glycol (MIRALAX / GLYCOLAX) 17 g packet Take 17 g by mouth daily.     predniSONE (DELTASONE) 50 MG tablet Take 1 tablet (50 mg total) by mouth as directed. Take one tablet 13 hours , 1 tablet 7 hours and 1 tablet 1 hour prior to CT scan with IV contrast.  Take Benadryl 50 mg po 1 hour before CT scan 3 tablet 5   Repotrectinib 40 MG CAPS Take 160 mg by mouth daily. (Patient taking differently: Take 160 mg by mouth 2 (two) times  daily.) 56 capsule 0   rivaroxaban (XARELTO) 20 MG TABS tablet Take 1 tablet (20 mg total) by mouth daily with supper. 30 tablet 3   zinc sulfate 220 (50 Zn) MG capsule Take 220 mg by mouth daily.     Current Facility-Administered Medications on File Prior to Visit  Medication Dose Route Frequency Provider Last Rate Last Admin   famotidine (PEPCID) tablet 20 mg  20 mg Oral Once Edsel Petrin, DO        Allergies:  Allergies  Allergen Reactions   Hydrocodone Nausea Only    Dizzy, "crawl out of my skin"   Tramadol Other (See Comments)    "Weird feeling"   Iodinated Contrast Media Hives   Past Medical History:  Past Medical History:  Diagnosis Date   Anxiety    [employer]reesia 11/21/2020   Cancer [facility])    Past Surgical History:  Past Surgical History:  Procedure Laterality Date   ABDOMINAL AORTOGRAM W/LOWER EXTREMITY N/A 11/27/2022   Procedure: ABDOMINAL AORTOGRAM W/LOWER EXTREMITY;  Surgeon: Leonie Douglas, MD;  Location: [facility] INVASIVE CV LAB;  Service: Cardiovascular;  Laterality: N/A;   IR PERC PLEURAL DRAIN W/INDWELL CATH W/IMG GUIDE  11/09/2022   PERIPHERAL VASCULAR ATHERECTOMY  11/27/2022   Procedure: PERIPHERAL VASCULAR ATHERECTOMY;  Surgeon: Leonie Douglas, MD;  Location: [facility] INVASIVE CV LAB;  Service: Cardiovascular;;   PERIPHERAL VASCULAR THROMBECTOMY  11/27/2022   Procedure: PERIPHERAL VASCULAR THROMBECTOMY;  Surgeon: Leonie Douglas, MD;  Location: [facility] INVASIVE CV LAB;  Service: Cardiovascular;;   REMOVAL OF PLEURAL DRAINAGE CATHETER N/A 12/17/2022   Procedure: MINOR REMOVAL OF PLEURAL DRAINAGE CATHETER;  Surgeon: Josephine Igo, DO;  Location: [facility] ENDOSCOPY;  Service: Cardiopulmonary;  Laterality: N/A;   WISDOM TOOTH EXTRACTION     Social History:  Social History   Socioeconomic History   Marital status: Married    Spouse name: Not on file   Number of children: Not on file   Years of education: Not on file   Highest education level: Not on file  Occupational  History   Not on file  Tobacco Use   Smoking status: Never   Smokeless tobacco: Never  Vaping Use   Vaping Use: Never used  Substance and Sexual Activity   Alcohol use: Yes    Alcohol/week: 7.0 standard drinks of alcohol    Types: 7 Glasses of wine per week    Comment: 1 glass wine in the evening    Drug use: Yes    Types: Marijuana    Comment: occasional   Sexual activity: Yes    Birth control/protection: I.U.D.  Other Topics Concern   Not on file  Social History Narrative   Not on file   Social Determinants of Health   Financial Resource Strain:  Not on file  Food Insecurity: No Food Insecurity (11/26/2022)   Hunger Vital Sign    Worried About Running Out of Food in the Last Year: Never true    Ran Out of Food in the Last Year: Never true  Transportation Needs: No Transportation Needs (11/26/2022)   PRAPARE - Administrator, Civil Service (Medical): No    Lack of Transportation (Non-Medical): No  Physical Activity: Not on file  Stress: Not on file  Social Connections: Not on file  Intimate Partner Violence: Not At Risk (11/26/2022)   Humiliation, Afraid, Rape, and Kick questionnaire    Fear of Current or Ex-Partner: No    Emotionally Abused: No    Physically Abused: No    Sexually Abused: No   Family History:  Family History  Problem Relation Age of Onset   Cancer Mother    Cancer Maternal Grandfather    Cancer Maternal Grandmother    Cancer Paternal Grandfather    Hypertension Paternal Grandmother     Review of Systems: Constitutional: Doesn't report fevers, chills or abnormal weight loss Eyes: Doesn't report blurriness of vision Ears, nose, mouth, throat, and face: Doesn't report sore throat Respiratory: Doesn't report cough, dyspnea or wheezes Cardiovascular: Doesn't report palpitation, chest discomfort  Gastrointestinal:  Doesn't report nausea, constipation, diarrhea GU: Doesn't report incontinence Skin: Doesn't report skin rashes Neurological:  Per HPI Musculoskeletal: Doesn't report joint pain Behavioral/Psych: Doesn't report anxiety  Physical Exam: Vitals:   04/10/23 1245  BP: 126/70  Pulse: 75  Resp: 20  Temp: 98 F (36.7 C)  SpO2: 100%   KPS: 90. General: Alert, cooperative, pleasant, in no acute distress Head: Normal EENT: No conjunctival injection or scleral icterus.  Lungs: Resp effort normal Cardiac: Regular rate Abdomen: Non-distended abdomen Skin: No rashes cyanosis or petechiae. Extremities: No clubbing or edema  Neurologic Exam: Mental Status: Awake, alert, attentive to examiner. Oriented to self and environment. Language is fluent with intact comprehension.  Cranial Nerves: Visual acuity is grossly normal. Visual fields are full. Extra-ocular movements intact. No ptosis. Face is symmetric Motor: Tone and bulk are normal. Power is full in both arms and legs. Reflexes are symmetric, no pathologic reflexes present.  Sensory: Intact to light touch Gait: Normal.   Labs: I have reviewed the data as listed    Component Value Date/Time   NA 140 04/01/2023 1559   NA 140 11/24/2020 1106   K 3.7 04/01/2023 1559   CL 106 04/01/2023 1559   CO2 25 04/01/2023 1559   GLUCOSE 206 (H) 04/01/2023 1559   BUN 16 04/01/2023 1559   BUN 7 11/24/2020 1106   CREATININE 0.86 04/01/2023 1559   CALCIUM 10.1 04/01/2023 1559   PROT 8.3 (H) 04/01/2023 1559   PROT 7.7 11/24/2020 1106   ALBUMIN 5.2 (H) 04/01/2023 1559   ALBUMIN 5.0 (H) 11/24/2020 1106   AST 20 04/01/2023 1559   ALT 19 04/01/2023 1559   ALKPHOS 70 04/01/2023 1559   BILITOT 0.3 04/01/2023 1559   GFRNONAA >60 04/01/2023 1559   GFRAA 119 11/24/2020 1106   Lab Results  Component Value Date   WBC 7.0 04/01/2023   NEUTROABS 6.3 04/01/2023   HGB 11.3 (L) 04/01/2023   HCT 33.9 (L) 04/01/2023   MCV 89.7 04/01/2023   PLT 536 (H) 04/01/2023   Imaging:  [facility] Clinician Interpretation: I have personally reviewed the CNS images as listed.  My interpretation,  in the context of the patient's clinical presentation, is stable disease pending official  VAS [place] ABI WITH/WO TBI  Result Date: 04/09/2023  LOWER EXTREMITY DOPPLER STUDY Patient Name:  Melissa Pineda  Date of Exam:   04/08/2023 Medical Rec #: [id number]     Accession #:    [id number] Date of Birth: 03/13/1986      Patient Gender: F Patient Age:   31 years Exam Location:  [facility] Procedure:      VAS [place] ABI WITH/WO TBI Referring Phys: Heath Lark --------------------------------------------------------------------------------  Indications: Follow up left lower extremity intervention High Risk Factors: No history of smoking. Other Factors: Stage IV adenocarcinoma of lung.  Vascular Interventions: 11/27/22 Mechanical thrombectomy of left profunda femoral,                         popliteal artery and left ATA. Comparison Study: 01/07/2023 ABI/TBI- right=0.84/0.48, left=0.70/0.54 Performing Technologist: [name] MHA, RVT, RDCS, RDMS  Examination Guidelines: A complete evaluation includes at minimum, Doppler waveform signals and systolic blood pressure reading at the level of bilateral brachial, anterior tibial, and posterior tibial arteries, when vessel segments are accessible. Bilateral testing is considered an integral part of a complete examination. Photoelectric Plethysmograph (PPG) waveforms and toe systolic pressure readings are included as required and additional duplex testing as needed. Limited examinations for reoccurring indications may be performed as noted.  ABI Findings: +---------+------------------+-----+---------+--------+ Right    Rt Pressure (mmHg)IndexWaveform Comment  +---------+------------------+-----+---------+--------+ Brachial 115                                      +---------+------------------+-----+---------+--------+ PTA      108               0.93 biphasic          +---------+------------------+-----+---------+--------+ DP       121                1.04 triphasic         +---------+------------------+-----+---------+--------+ Great Toe83                0.72                   +---------+------------------+-----+---------+--------+ +---------+------------------+-----+---------+-------+ Left     Lt Pressure (mmHg)IndexWaveform Comment +---------+------------------+-----+---------+-------+ Brachial 116                                     +---------+------------------+-----+---------+-------+ PTA      119               1.03 triphasic        +---------+------------------+-----+---------+-------+ DP       100               0.86 biphasic         +---------+------------------+-----+---------+-------+ Great Toe84                0.72                  +---------+------------------+-----+---------+-------+ +-------+-----------+-----------+------------+------------+ ABI/TBIToday's ABIToday's TBIPrevious ABIPrevious TBI +-------+-----------+-----------+------------+------------+ Right  1.04       0.72       0.84        0.48         +-------+-----------+-----------+------------+------------+ Left   1.03       0.72       0.70  0.54         +-------+-----------+-----------+------------+------------+  Bilateral ABIs and TBIs appear increased compared to prior study on 01/07/2023.  Summary: Right: Resting right ankle-brachial index is within normal range. The right toe-brachial index is normal. Left: Resting left ankle-brachial index is within normal range. The left toe-brachial index is normal. *See table(s) above for measurements and observations.  Electronically signed by Coral Else MD on 04/09/2023 at 2:29:51 PM.    Final    CT Abdomen Pelvis W Contrast  Result Date: 04/03/2023 CLINICAL DATA:  Non-small cell lung cancer.  * Tracking Code: BO * EXAM: CT CHEST, ABDOMEN, AND PELVIS WITH CONTRAST TECHNIQUE: Multidetector CT imaging of the chest, abdomen and pelvis was performed following the standard  protocol during bolus administration of intravenous contrast. RADIATION DOSE REDUCTION: This exam was performed according to the departmental dose-optimization program which includes automated exposure control, adjustment of the mA and/or kV according to patient size and/or use of iterative reconstruction technique. CONTRAST:  OMNIPAQUE IOHEXOL 300 MG/ML  SOLN COMPARISON:  03/03/2023 FINDINGS: CT CHEST FINDINGS Cardiovascular: The heart is normal in size. No pericardial effusion. The aorta is normal. The pulmonary arteries are normal. Mediastinum/Nodes: Stable residual thymic tissue in the anterior mediastinum. Small scattered mediastinal hilar lymph nodes with further reduction in size since the recent chest CT and significant improvement from the patient's regional imaging studies suggesting a continued good response to treatment. The esophagus is grossly normal. Lungs/Pleura: Left basilar scarring changes and minimal residual ill-defined nodularity at the left lung base but overall marked improvement in no findings suspicious for recurrent tumor. No new pulmonary nodules to suggest pulmonary metastatic disease. The right lung nodule seen on the PET-CT have resolved. Left basilar pleural thickening but no recurrent pleural effusion. Musculoskeletal: No breast masses, supraclavicular or axillary adenopathy. No bone lesions. CT ABDOMEN PELVIS FINDINGS Hepatobiliary: No hepatic lesions to suggest metastatic disease. Pancreas: Normal Spleen: Normal Adrenals/Urinary Tract: Normal Stomach/Bowel: No significant findings. Vascular/Lymphatic: The aorta is normal in caliber. No dissection. The branch vessels are patent. The major venous structures are patent. No mesenteric or retroperitoneal mass or adenopathy. No recurrent gastrohepatic ligament node. Small scattered lymph nodes are noted. Reproductive: Retroverted uterus with IUD in place. No adnexal masses. Other: No pelvic mass or adenopathy. No free pelvic fluid  collections. No inguinal mass or adenopathy. No abdominal wall hernia or subcutaneous lesions. Musculoskeletal: No significant bony findings. No worrisome bone lesions. IMPRESSION: 1. Stable scarring changes at the left lung base but no findings suspicious for recurrent tumor. No new metastatic pulmonary disease. 2. Small residual mediastinal and hilar lymph nodes but no recurrent adenopathy. 3. No findings for abdominal/pelvic metastatic disease or osseous metastatic disease. 4. Overall findings consistent with a good response to treatment without evidence for recurrent or new metastatic disease. Electronically Signed   By: Rudie Meyer M.D.   On: 04/03/2023 08:07   CT Chest W Contrast  Result Date: 04/03/2023 CLINICAL DATA:  Non-small cell lung cancer.  * Tracking Code: BO * EXAM: CT CHEST, ABDOMEN, AND PELVIS WITH CONTRAST TECHNIQUE: Multidetector CT imaging of the chest, abdomen and pelvis was performed following the standard protocol during bolus administration of intravenous contrast. RADIATION DOSE REDUCTION: This exam was performed according to the departmental dose-optimization program which includes automated exposure control, adjustment of the mA and/or kV according to patient size and/or use of iterative reconstruction technique. CONTRAST:  OMNIPAQUE IOHEXOL 300 MG/ML  SOLN COMPARISON:  03/03/2023 FINDINGS: CT CHEST FINDINGS Cardiovascular: The  heart is normal in size. No pericardial effusion. The aorta is normal. The pulmonary arteries are normal. Mediastinum/Nodes: Stable residual thymic tissue in the anterior mediastinum. Small scattered mediastinal hilar lymph nodes with further reduction in size since the recent chest CT and significant improvement from the patient's regional imaging studies suggesting a continued good response to treatment. The esophagus is grossly normal. Lungs/Pleura: Left basilar scarring changes and minimal residual ill-defined nodularity at the left lung base but  overall marked improvement in no findings suspicious for recurrent tumor. No new pulmonary nodules to suggest pulmonary metastatic disease. The right lung nodule seen on the PET-CT have resolved. Left basilar pleural thickening but no recurrent pleural effusion. Musculoskeletal: No breast masses, supraclavicular or axillary adenopathy. No bone lesions. CT ABDOMEN PELVIS FINDINGS Hepatobiliary: No hepatic lesions to suggest metastatic disease. Pancreas: Normal Spleen: Normal Adrenals/Urinary Tract: Normal Stomach/Bowel: No significant findings. Vascular/Lymphatic: The aorta is normal in caliber. No dissection. The branch vessels are patent. The major venous structures are patent. No mesenteric or retroperitoneal mass or adenopathy. No recurrent gastrohepatic ligament node. Small scattered lymph nodes are noted. Reproductive: Retroverted uterus with IUD in place. No adnexal masses. Other: No pelvic mass or adenopathy. No free pelvic fluid collections. No inguinal mass or adenopathy. No abdominal wall hernia or subcutaneous lesions. Musculoskeletal: No significant bony findings. No worrisome bone lesions. IMPRESSION: 1. Stable scarring changes at the left lung base but no findings suspicious for recurrent tumor. No new metastatic pulmonary disease. 2. Small residual mediastinal and hilar lymph nodes but no recurrent adenopathy. 3. No findings for abdominal/pelvic metastatic disease or osseous metastatic disease. 4. Overall findings consistent with a good response to treatment without evidence for recurrent or new metastatic disease. Electronically Signed   By: Rudie Meyer M.D.   On: 04/03/2023 08:07     Assessment/Plan Cerebrovascular accident (CVA) due to embolism of right middle cerebral artery (HCC)  Melissa Pineda is clinically stable today.  MRI brain appears stable, pending official read.   We recommended continuing Xarelto, Aspirin, Plavix for the next 3 months.  Following that, plavix could be  discontinued. Long term anticoagulation will be recommended, Xarelto is appropriate.    No further CNS imaging or follow up absent new or progressive symptoms.  We appreciate the opportunity to participate in the care of Melissa Pineda.  She will con't to follow with vascular surgery, medical oncology.  All questions were answered. The patient knows to call the clinic with any problems, questions or concerns. No barriers to learning were detected.  The total time spent in the encounter was 30 minutes and more than 50% was on counseling and review of test results   Henreitta Leber, MD Medical Director of Neuro-Oncology [facility] at [place] [facility] 04/10/23 12:50 PM

## 2023-04-11 ENCOUNTER — Telehealth: Payer: Self-pay | Admitting: *Deleted

## 2023-04-11 NOTE — Telephone Encounter (Signed)
Per Corbin Ade, P.A., called pt to f/u about referral to derm or GYN to follow thyroid. Pt declined referral. Stated if referral is needed she will contact office.

## 2023-04-17 LAB — T3: T3, Total: 100 ng/dL (ref 71–180)

## 2023-04-19 ENCOUNTER — Other Ambulatory Visit: Payer: Self-pay

## 2023-04-19 DIAGNOSIS — I739 Peripheral vascular disease, unspecified: Secondary | ICD-10-CM

## 2023-04-25 ENCOUNTER — Other Ambulatory Visit: Payer: Self-pay | Admitting: *Deleted

## 2023-04-25 ENCOUNTER — Other Ambulatory Visit: Payer: Self-pay

## 2023-04-25 ENCOUNTER — Other Ambulatory Visit: Payer: Self-pay | Admitting: Internal Medicine

## 2023-04-25 DIAGNOSIS — C3492 Malignant neoplasm of unspecified part of left bronchus or lung: Secondary | ICD-10-CM

## 2023-04-25 DIAGNOSIS — Z515 Encounter for palliative care: Secondary | ICD-10-CM

## 2023-04-25 DIAGNOSIS — F419 Anxiety disorder, unspecified: Secondary | ICD-10-CM

## 2023-04-25 MED ORDER — PANTOPRAZOLE SODIUM 40 MG PO TBEC: 40.0000 mg | DELAYED_RELEASE_TABLET | Freq: Every day | ORAL | 3 refills | Status: DC

## 2023-04-25 MED ORDER — RIVAROXABAN 20 MG PO TABS: 20.0000 mg | ORAL_TABLET | Freq: Every day | ORAL | 1 refills | Status: DC

## 2023-04-25 MED ORDER — ALPRAZOLAM 0.5 MG PO TABS
0.5000 mg | ORAL_TABLET | Freq: Three times a day (TID) | ORAL | 0 refills | Status: DC | PRN
Start: 2023-04-25 — End: 2023-07-01

## 2023-05-06 ENCOUNTER — Telehealth: Payer: Self-pay

## 2023-05-06 DIAGNOSIS — I739 Peripheral vascular disease, unspecified: Secondary | ICD-10-CM

## 2023-05-06 NOTE — Telephone Encounter (Signed)
Caller: Patient  Concern: leg tingling, falling asleep at random times despite different positions, pain in foot and behind knee, pt extremely worried  Pt denies any discoloration, warmth, coldness, redness, or swelling  Location: left leg  Description:  x 1 wk  Aggravating Factors: no position changes make any difference  Quality: aching and dull  Treatments:  pt has been elevating  Consulted: Marisue Humble, PA  Resolution: Appointment scheduled for ABI for first available  Next Appt: Will wait to sch appt until after ABI results

## 2023-05-07 ENCOUNTER — Ambulatory Visit (HOSPITAL_COMMUNITY)
Admission: RE | Admit: 2023-05-07 | Discharge: 2023-05-07 | Disposition: A | Discharge status: Home / Self Care | Payer: BC Managed Care – PPO | Source: Ambulatory Visit | Attending: Vascular Surgery | Admitting: Vascular Surgery

## 2023-05-07 DIAGNOSIS — I739 Peripheral vascular disease, unspecified: Secondary | ICD-10-CM

## 2023-05-07 LAB — VAS US ABI WITH/WO TBI
Left ABI: 0.91
Right ABI: 0.81

## 2023-05-08 ENCOUNTER — Telehealth: Payer: Self-pay | Admitting: Vascular Surgery

## 2023-05-08 NOTE — Telephone Encounter (Signed)
lvm to schedule appt with [facility] next week. slot held.

## 2023-05-09 ENCOUNTER — Telehealth: Payer: Self-pay

## 2023-05-09 NOTE — Telephone Encounter (Signed)
Pt called stating that the pain in her LLE is increasing and also behind BL knees. She is requesting another [place].  Reviewed pt's chart, returned call for clarification, two identifiers used. Pt states that the top of her thigh is also hurting. She is extremely worried about preventing something from happening. Moved her appt from Tuesday, 7/23 to Monday, 7/22 since there was an available opening with Dr. Lenell Antu. Reassured her and instructed her to see the provider first so he could make sure of the most accurate testing, if she still needed any. Confirmed understanding.

## 2023-05-11 NOTE — Progress Notes (Signed)
[facility] [place]  ASSESSMENT / PLAN: 37 y.o. female with subacute bilateral lower extremity ischemia causing rest pain in the left lower extremity and claudication in the right lower extremity. Likely from cardioembolism or tumor embolism to lower extremities. This was managed with successful endovascular thrombectomy 11/27/22 with resolution of rest pain.  Her claudication type symptoms have returned. She is also describing paresthesias in bilateral lower extremities and left upper extremity. She had a silent stroke identified in March 2024. Repeat MRI showed no new stroke.   Overall, I am concerned about a persistent embolic source in her heart. CT angiogram identified a tumor / thrombus in her left atrium in February 2024. We have not been able to see this since with transthoracic echocardiogram. I have requested a transesophageal echocardiogram. I reached out to Dr. Flora Lipps to ask about other imaging options we may have to better study her heart as an embolic source. I am also going to perform an arterial ultrasound of both legs to evaluate for new occlusions.   Thankfully, her symptoms in the lower extremity are not limb-threatening.  I counseled her I cannot explain her paresthesias from a circulation standpoint, especially because involve the left upper extremity as well.  I encouraged her to exercise as much as she can tolerate.  Pending the above results, we will keep her on "triple therapy" indefinitely (aspirin, Plavix, Xarelto).  She may need to transition from Xarelto to Coumadin if new thrombus is identified.  CHIEF COMPLAINT: Severe leg pain  HISTORY OF PRESENT ILLNESS: Melissa Pineda is a 37 y.o. female who was urgently added into the clinic today for evaluation of ultrasound findings.  The patient has had an unusual course.  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  She was found to have genetic  abnormalities causing her lung cancer.  She is a never smoker.  She had malignant effusion and required a Pleurx catheter.  She reports worsening leg pain over the past several days.  Prior to this, she describes leg pain like shinsplints.  She reports severe pain about the metatarsals and toes on the left, classic for ischemic rest pain.  She reports cramping discomfort in the right leg, which is bearable to her.  01/06/23: Patient returns to clinic after intervention and beginning immunotherapy.  She is doing much better overall.  She looks much more healthy.  She does describe some cramping discomfort with walking and easy fatigability.  She does not describe rest pain.  Her symptoms are manageable for her.  Since CT scan of the chest shows good response to immunotherapy.  04/08/23: Patient returns to clinic.  She is doing very well.  She has been told she has a complete response to her directed therapy for adenocarcinoma of the lung.  She is walking is much as she likes.  She has noticed easy bruising.  She has not noticed problematic bleeding.  She does not have menorrhagia.  She seems to be tolerating triple therapy fairly well.  She did suffer a punctate stroke in March 2024.  A small speech deficit has since resolved.  She had a repeat MRI scan of the brain today looking for new infarcts.  I reviewed these findings with her.  I agree with her oncologist, continuing maximal anticoagulant and antiplatelet therapy seems reasonable for the foreseeable future.  05/12/23: Patient returns to clinic.  She reports new claudication type symptoms in both legs.  She has cramping discomfort in her proximal calves and behind the knee bilaterally.  She does not have rest pain.  She also describes paresthesias in both legs.  These occur with rest or activity.  She also describes paresthesias in the left upper extremity.  We reviewed her recent course.    VASCULAR SURGICAL HISTORY: none  VASCULAR RISK  FACTORS: Negative history of stroke / transient ischemic attack. Negative history of coronary artery disease.  Negative history of diabetes mellitus.  Negative history of smoking. Negative history of hypertension.  Negative history of chronic kidney disease.  Negative history of chronic obstructive pulmonary disease.  FUNCTIONAL STATUS: ECOG performance status: (0) Fully active, able to carry on all predisease performance without restriction Ambulatory status: Ambulatory within the community without limits  Past Medical History:  Diagnosis Date   Anxiety    [employer]reesia 11/21/2020   Cancer [facility])     Past Surgical History:  Procedure Laterality Date   ABDOMINAL AORTOGRAM W/LOWER EXTREMITY N/A 11/27/2022   Procedure: ABDOMINAL AORTOGRAM W/LOWER EXTREMITY;  Surgeon: Leonie Douglas, MD;  Location: [facility] INVASIVE [facility];  Service: Cardiovascular;  Laterality: N/A;   IR PERC PLEURAL DRAIN W/INDWELL CATH W/IMG GUIDE  11/09/2022   PERIPHERAL VASCULAR ATHERECTOMY  11/27/2022   Procedure: PERIPHERAL VASCULAR ATHERECTOMY;  Surgeon: Leonie Douglas, MD;  Location: [facility] INVAS[facility] CV [facility];  Service: Cardiovascular;;   PERIPHERAL VASCULAR THROMBECTOMY  11/27/2022   Procedure: PERIPHERAL VASCULAR THROMBECTOMY;  Surgeon: Leonie Douglas, MD;  Location: [facility] INVAS[facility] CV LAB;  Service: Cardiovascular;;   REMOVAL OF PLEURAL DRAINAGE CATHETER N/A 12/17/2022   Procedure: MINOR REMOVAL OF PLEURAL DRAINAGE CATHETER;  Surgeon: Josephine Igo, DO;  Location: [facility] ENDOSCOPY;  Service: Cardiopulmonary;  Laterality: N/A;   WISDOM TOOTH EXTRACTION      Family History  Problem Relation Age of Onset   Cancer Mother    Cancer Maternal Grandfather    Cancer Maternal Grandmother    Cancer Paternal Grandfather    Hypertension Paternal Grandmother     Social History   Socioeconomic History   Marital status: Married    Spouse name: Not on file   Number of children: Not on file   Years of education: Not on file   Highest  education level: Not on file  Occupational History   Not on file  Tobacco Use   Smoking status: Never   Smokeless tobacco: Never  Vaping Use   Vaping status: Never Used  Substance and Sexual Activity   Alcohol use: Yes    Alcohol/week: 7.0 standard drinks of alcohol    Types: 7 Glasses of wine per week    Comment: 1 glass wine in the evening    Drug use: Yes    Types: Marijuana    Comment: occasional   Sexual activity: Yes    Birth control/protection: I.U.D.  Other Topics Concern   Not on file  Social History Narrative   Not on file   Social Determinants of Health   Financial Resource Strain: Not on file  Food Insecurity: No Food Insecurity (11/26/2022)   Hunger Vital Sign    Worried About Running Out of Food in the Last Year: Never true    Ran Out of Food in the Last Year: Never true  Transportation Needs: No Transportation Needs (11/26/2022)   PRAPARE - Administrator, Civil Service (Medical): No    Lack of Transportation (Non-Medical): No  Physical Activity: Not on file  Stress: Not on file  Social Connections: Not on file  Intimate Partner Violence: Not At Risk (11/26/2022)   Humiliation, Afraid, Rape, and Kick questionnaire    Fear of Current or Ex-Partner: No    Emotionally Abused: No    Physically Abused: No    Sexually Abused: No    Allergies  Allergen Reactions   Hydrocodone Nausea Only    Dizzy, "crawl out of my skin"   Tramadol Other (See Comments)    "Weird feeling"   Iodinated Contrast Media Hives    Current Outpatient Medications  Medication Sig Dispense Refill   acetaminophen (TYLENOL) 325 MG tablet Take 650 mg by mouth every 6 (six) hours as needed for mild pain (for mild pain.).     ALPRAZolam (XANAX) 0.5 MG tablet Take 1 tablet (0.5 mg total) by mouth 3 (three) times daily as needed for anxiety. 60 tablet 0   aspirin EC 81 MG tablet Take 1 tablet (81 mg total) by mouth daily. Swallow whole. 30 tablet 12   Cholecalciferol (VITAMIN D-3)  125 MCG (5000 UT) TABS Take 1 tablet by mouth daily.     clopidogrel (PLAVIX) 75 MG tablet Take 1 tablet (75 mg total) by mouth daily. 30 tablet 11   cyanocobalamin 1000 MCG tablet Take 1,000 mcg by mouth daily.     Ferrous Sulfate (IRON HIGH-POTENCY) 142 (45 Fe) MG TBCR Take 142 mg by mouth daily.     levonorgestrel (MIRENA, 52 MG,) 20 MCG/DAY IUD 1 each by Intrauterine route once.     Magnesium Oxide -Mg Supplement 400 MG CAPS Take 400 mg by mouth 2 (two) times daily. 60 capsule 0   metoCLOPramide (REGLAN) 5 MG tablet Take 1 tablet (5 mg total) by mouth every 6 (six) hours as needed for nausea. 30 tablet 1   ondansetron (ZOFRAN) 8 MG tablet Take 1 tablet (8 mg total) by mouth every 8 (eight) hours as needed for nausea or vomiting. 20 tablet 0   ondansetron (ZOFRAN-ODT) 8 MG disintegrating tablet Take 1 tablet (8 mg total) by mouth every 8 (eight) hours as needed for nausea or vomiting. 20 tablet 1   Ondansetron 4 MG FILM Take 4-8 mg by mouth every 6 (six) hours as needed. 15 each 0   oxyCODONE (ROXICODONE) 5 MG immediate release tablet Take 1 tablet (5 mg total) by mouth every 4 (four) hours as needed for severe pain. (Patient taking differently: Take 5 mg by mouth every 6 (six) hours as needed for severe pain.) 120 tablet 0   pantoprazole (PROTONIX) 40 MG tablet Take 1 tablet (40 mg total) by mouth daily. 30 tablet 3   polyethylene glycol (MIRALAX / GLYCOLAX) 17 g packet Take 17 g by mouth daily.     predniSONE (DELTASONE) 50 MG tablet Take 1 tablet (50 mg total) by mouth as directed. Take one tablet 13 hours , 1 tablet 7 hours and 1 tablet 1 hour prior to CT scan with IV contrast.  Take Benadryl 50 mg po 1 hour before CT scan 3 tablet 5   Repotrectinib 40 MG CAPS Take 160 mg by mouth daily. (Patient taking differently: Take 160 mg by mouth 2 (two) times daily.) 56 capsule 0   rivaroxaban (XARELTO) 20 MG TABS tablet Take 1 tablet (20 mg total) by mouth daily with supper. 90 tablet 1   zinc  sulfate 220 (50 Zn) MG capsule Take 220 mg by mouth daily.     Current Facility-Administered Medications  Medication Dose Route Frequency Provider Last Rate Last Admin  famotidine (PEPCID) tablet 20 mg  20 mg Oral Once Anderson Malta L, DO        PHYSICAL EXAM There were no vitals filed for this visit.  Young woman.  Anxious. Mild tachycardia Regular rate and rhythm Bilateral feet are warm No palpable pedal pulses.    PERTINENT LABORATORY AND RADIOLOGIC DATA  Most recent CBC    Latest Ref Rng & Units 04/01/2023    3:59 PM 02/18/2023    7:59 AM 01/15/2023    8:58 AM  CBC  WBC 4.0 - 10.5 K/uL 7.0  4.7  5.3   Hemoglobin 12.0 - 15.0 g/dL 88.4  9.6  9.8   Hematocrit 36.0 - 46.0 % 33.9  30.1  30.1   Platelets 150 - 400 K/uL 536  856  622      Most recent CMP    Latest Ref Rng & Units 04/01/2023    3:59 PM 02/18/2023    7:59 AM 12/26/2022    8:58 AM  CMP  Glucose 70 - 99 mg/dL 166  92  74   BUN 6 - 20 mg/dL 16  14  12    Creatinine 0.44 - 1.00 mg/dL 0.63  0.16  0.10   Sodium 135 - 145 mmol/L 140  142  142   Potassium 3.5 - 5.1 mmol/L 3.7  3.6  3.6   Chloride 98 - 111 mmol/L 106  109  107   CO2 22 - 32 mmol/L 25  28  29    Calcium 8.9 - 10.3 mg/dL 93.2  9.2  9.2   Total Protein 6.5 - 8.1 g/dL 8.3  6.8  7.4   Total Bilirubin 0.3 - 1.2 mg/dL 0.3  0.3  0.4   Alkaline Phos 38 - 126 U/L 70  64  84   AST 15 - 41 U/L 20  19  16    ALT 0 - 44 U/L 19  15  11      Renal function CrCl cannot be calculated (Patient's most recent lab result is older than the maximum 21 days allowed.).  Hgb A1c MFr Bld (%)  Date Value  11/24/2020 5.0    LDL Chol Calc (NIH)  Date Value Ref Range Status  11/24/2020 89 0 - 99 mg/dL Final   LDL Cholesterol  Date Value Ref Range Status  11/28/2022 104 (H) 0 - 99 mg/dL Final    Comment:           Total Cholesterol/HDL:CHD Risk Coronary Heart Disease Risk Table                     Men   Women  1/2 Average Risk   3.4   3.3  Average Risk        5.0   4.4  2 X Average Risk   9.6   7.1  3 X Average Risk  23.4   11.0        Use the calculated Patient Ratio above and the CHD Risk Table to determine the patient's CHD Risk.        ATP III CLASSIFICATION (LDL):  <100     mg/dL   Optimal  355-732  mg/dL   Near or Above                    Optimal  130-159  mg/dL   Borderline  202-542  mg/dL   High  >706     mg/dL   Very High Performed at [facility], [address], [place], [place] [address]      Vascular Imaging:  +-------+-----------+-----------+------------+------------+  ABI/TBIToday's ABIToday's TBIPrevious ABIPrevious TBI  +-------+-----------+-----------+------------+------------+  Right 0.81       0.58       1.04        0.72          +-------+-----------+-----------+------------+------------+  Left  0.91       0.62       1.03        0.72          +-------+-----------+-----------+------------+------------+   Rande Brunt. Lenell Antu, MD [facility] Phone Number: [phone] 05/11/2023 2:13 PM   Total time spent on preparing this encounter including chart review, data review, collecting history, examining the patient, coordinating care for this patient, 30 minutes.  Portions of this report may have been transcribed using voice recognition software.  Every effort has been made to ensure accuracy; however, inadvertent computerized transcription errors may still be present.

## 2023-05-12 ENCOUNTER — Ambulatory Visit (INDEPENDENT_AMBULATORY_CARE_PROVIDER_SITE_OTHER): Payer: BC Managed Care – PPO | Admitting: Vascular Surgery

## 2023-05-12 ENCOUNTER — Ambulatory Visit (HOSPITAL_COMMUNITY)
Admission: RE | Admit: 2023-05-12 | Discharge: 2023-05-12 | Disposition: A | Discharge status: Home / Self Care | Payer: BC Managed Care – PPO | Source: Ambulatory Visit | Attending: Vascular Surgery | Admitting: Vascular Surgery

## 2023-05-12 ENCOUNTER — Encounter: Payer: Self-pay | Admitting: Vascular Surgery

## 2023-05-12 ENCOUNTER — Other Ambulatory Visit (HOSPITAL_COMMUNITY): Payer: Self-pay | Admitting: Vascular Surgery

## 2023-05-12 VITALS — BP 118/74 | HR 76 | Temp 97.9°F | Resp 20 | Ht 66.0 in | Wt 158.0 lb

## 2023-05-12 DIAGNOSIS — I739 Peripheral vascular disease, unspecified: Secondary | ICD-10-CM | POA: Insufficient documentation

## 2023-05-13 ENCOUNTER — Ambulatory Visit: Payer: BC Managed Care – PPO | Admitting: Vascular Surgery

## 2023-05-13 ENCOUNTER — Inpatient Hospital Stay: Payer: BC Managed Care – PPO | Attending: Internal Medicine

## 2023-05-13 ENCOUNTER — Inpatient Hospital Stay (HOSPITAL_BASED_OUTPATIENT_CLINIC_OR_DEPARTMENT_OTHER): Payer: BC Managed Care – PPO | Admitting: Internal Medicine

## 2023-05-13 ENCOUNTER — Encounter: Payer: Self-pay | Admitting: Nurse Practitioner

## 2023-05-13 ENCOUNTER — Inpatient Hospital Stay (HOSPITAL_BASED_OUTPATIENT_CLINIC_OR_DEPARTMENT_OTHER): Payer: BC Managed Care – PPO | Admitting: Nurse Practitioner

## 2023-05-13 VITALS — BP 140/68 | HR 87 | Temp 97.6°F | Resp 16 | Ht 66.0 in | Wt 157.5 lb

## 2023-05-13 DIAGNOSIS — Z7982 Long term (current) use of aspirin: Secondary | ICD-10-CM | POA: Insufficient documentation

## 2023-05-13 DIAGNOSIS — Z793 Long term (current) use of hormonal contraceptives: Secondary | ICD-10-CM | POA: Insufficient documentation

## 2023-05-13 DIAGNOSIS — C3490 Malignant neoplasm of unspecified part of unspecified bronchus or lung: Secondary | ICD-10-CM | POA: Diagnosis not present

## 2023-05-13 DIAGNOSIS — Z515 Encounter for palliative care: Secondary | ICD-10-CM

## 2023-05-13 DIAGNOSIS — J910 Malignant pleural effusion: Secondary | ICD-10-CM | POA: Diagnosis not present

## 2023-05-13 DIAGNOSIS — R200 Anesthesia of skin: Secondary | ICD-10-CM | POA: Diagnosis not present

## 2023-05-13 DIAGNOSIS — D649 Anemia, unspecified: Secondary | ICD-10-CM | POA: Insufficient documentation

## 2023-05-13 DIAGNOSIS — Z7902 Long term (current) use of antithrombotics/antiplatelets: Secondary | ICD-10-CM | POA: Diagnosis not present

## 2023-05-13 DIAGNOSIS — R530 Neoplastic (malignant) related fatigue: Secondary | ICD-10-CM | POA: Diagnosis not present

## 2023-05-13 DIAGNOSIS — C3492 Malignant neoplasm of unspecified part of left bronchus or lung: Secondary | ICD-10-CM | POA: Diagnosis not present

## 2023-05-13 DIAGNOSIS — Z79899 Other long term (current) drug therapy: Secondary | ICD-10-CM | POA: Diagnosis not present

## 2023-05-13 DIAGNOSIS — Z7952 Long term (current) use of systemic steroids: Secondary | ICD-10-CM | POA: Diagnosis not present

## 2023-05-13 DIAGNOSIS — Z3202 Encounter for pregnancy test, result negative: Secondary | ICD-10-CM | POA: Diagnosis not present

## 2023-05-13 DIAGNOSIS — C7951 Secondary malignant neoplasm of bone: Secondary | ICD-10-CM | POA: Insufficient documentation

## 2023-05-13 DIAGNOSIS — F419 Anxiety disorder, unspecified: Secondary | ICD-10-CM | POA: Diagnosis not present

## 2023-05-13 DIAGNOSIS — Z7901 Long term (current) use of anticoagulants: Secondary | ICD-10-CM | POA: Diagnosis not present

## 2023-05-13 LAB — CBC WITH DIFFERENTIAL (CANCER CENTER ONLY)
Abs Immature Granulocytes: 0.01 10*3/uL (ref 0.00–0.07)
Basophils Absolute: 0 10*3/uL (ref 0.0–0.1)
Basophils Relative: 0 %
Eosinophils Absolute: 0.1 10*3/uL (ref 0.0–0.5)
Eosinophils Relative: 2 %
HCT: 30.7 % — ABNORMAL LOW (ref 36.0–46.0)
Hemoglobin: 10.4 g/dL — ABNORMAL LOW (ref 12.0–15.0)
Immature Granulocytes: 0 %
Lymphocytes Relative: 32 %
Lymphs Abs: 1.5 10*3/uL (ref 0.7–4.0)
MCH: 31 pg (ref 26.0–34.0)
MCHC: 33.9 g/dL (ref 30.0–36.0)
MCV: 91.4 fL (ref 80.0–100.0)
Monocytes Absolute: 0.3 10*3/uL (ref 0.1–1.0)
Monocytes Relative: 7 %
Neutro Abs: 2.8 10*3/uL (ref 1.7–7.7)
Neutrophils Relative %: 59 %
Platelet Count: 456 10*3/uL — ABNORMAL HIGH (ref 150–400)
RBC: 3.36 MIL/uL — ABNORMAL LOW (ref 3.87–5.11)
RDW: 13.5 % (ref 11.5–15.5)
WBC Count: 4.8 10*3/uL (ref 4.0–10.5)
nRBC: 0 % (ref 0.0–0.2)

## 2023-05-13 LAB — CMP (CANCER CENTER ONLY)
ALT: 18 U/L (ref 0–44)
AST: 20 U/L (ref 15–41)
Albumin: 4.8 g/dL (ref 3.5–5.0)
Alkaline Phosphatase: 54 U/L (ref 38–126)
Anion gap: 7 (ref 5–15)
BUN: 11 mg/dL (ref 6–20)
CO2: 27 mmol/L (ref 22–32)
Calcium: 9.5 mg/dL (ref 8.9–10.3)
Chloride: 108 mmol/L (ref 98–111)
Creatinine: 0.72 mg/dL (ref 0.44–1.00)
GFR, Estimated: >60 mL/min (ref >60)
Glucose, Bld: 96 mg/dL (ref 70–99)
Potassium: 3.8 mmol/L (ref 3.5–5.1)
Sodium: 142 mmol/L (ref 135–145)
Total Bilirubin: 0.4 mg/dL (ref 0.3–1.2)
Total Protein: 7.2 g/dL (ref 6.5–8.1)

## 2023-05-13 LAB — MAGNESIUM: Magnesium: 2.1 mg/dL (ref 1.7–2.4)

## 2023-05-13 LAB — PHOSPHORUS: Phosphorus: 3.2 mg/dL (ref 2.5–4.6)

## 2023-05-13 LAB — URIC ACID: Uric Acid, Serum: 2.2 mg/dL — ABNORMAL LOW (ref 2.5–7.1)

## 2023-05-13 LAB — PREGNANCY, URINE: Preg Test, Ur: NEGATIVE

## 2023-05-13 LAB — CK: Total CK: 162 U/L (ref 38–234)

## 2023-05-13 LAB — IRON AND IRON BINDING CAPACITY (CC-WL,HP ONLY)
Iron: 108 ug/dL (ref 28–170)
Saturation Ratios: 33 % — ABNORMAL HIGH (ref 10.4–31.8)
TIBC: 325 ug/dL (ref 250–450)
UIBC: 217 ug/dL (ref 148–442)

## 2023-05-13 LAB — FERRITIN: Ferritin: 29 ng/mL (ref 11–307)

## 2023-05-13 NOTE — Progress Notes (Signed)
[facility] [phone]   [phone]) [phone]  OFFICE PROGRESS NOTE  Patient, No Pcp Per No address on file  DIAGNOSIS: Stage IVB (T4, N3, M1b) non-small cell lung cancer, adenocarcinoma presented with large left diaphragmatic surface mass in addition to left hilar, infrahilar, AP window, right and left paratracheal, subcarinal as well as left internal mammary lymphadenopathy in addition to right gastric lymph node and solitary Small right frontal calvarial osseous metastatic disease with large malignant left pleural effusion diagnosed in January 2024.    Molecular studies by [employer] as well as [employer] blood test showed:   EZR-ROS1 fusion approved by FDA Crizotinib, Entrectinib, Repotrectinib   approved in other indication Ceritinib, Lorlatinib Yes      3.6%   PD-L1 expression by [employer] that was 98%.   PRIOR THERAPY: Status post left Pleurx catheter placement for drainage of recurrent left pleural effusion.  This was removed on December 17, 2022.  CURRENT THERAPY: Repotrectinib (Augtyro) 160 mg p.o. daily for the first 2 weeks and then 160 mg p.o. twice daily, first dose November 27, 2022.  She is status post 5.5 months of treatment  INTERVAL HISTORY: Melissa Pineda 37 y.o. female returns to the clinic today for follow-up visit.  The patient is feeling fine today with no concerning complaints except for occasional numbness in her legs.  She was seen by her vascular surgeon recently and Doppler showed no concerning findings.  He will arrange for her to have TEE to evaluate the cardiac thrombus in preparation to discontinue anticoagulation.  She denied having any current chest pain, shortness of breath, cough or hemoptysis.  She has no nausea, vomiting, diarrhea or constipation.  She has no headache or visual changes.  She has no more hair loss.  She is here today for evaluation and repeat blood work.   MEDICAL HISTORY: Past Medical History:   Diagnosis Date   Anxiety    [employer] 11/21/2020   Cancer (HCC)     ALLERGIES:  is allergic to hydrocodone, tramadol, and iodinated contrast media.  MEDICATIONS:  Current Outpatient Medications  Medication Sig Dispense Refill   acetaminophen (TYLENOL) 325 MG tablet Take 650 mg by mouth every 6 (six) hours as needed for mild pain (for mild pain.).     ALPRAZolam (XANAX) 0.5 MG tablet Take 1 tablet (0.5 mg total) by mouth 3 (three) times daily as needed for anxiety. 60 tablet 0   aspirin EC 81 MG tablet Take 1 tablet (81 mg total) by mouth daily. Swallow whole. 30 tablet 12   Cholecalciferol (VITAMIN D-3) 125 MCG (5000 UT) TABS Take 1 tablet by mouth daily.     clopidogrel (PLAVIX) 75 MG tablet Take 1 tablet (75 mg total) by mouth daily. 30 tablet 11   cyanocobalamin 1000 MCG tablet Take 1,000 mcg by mouth daily.     Ferrous Sulfate (IRON HIGH-POTENCY) 142 (45 Fe) MG TBCR Take 142 mg by mouth daily.     levonorgestrel (MIRENA, 52 MG,) 20 MCG/DAY IUD 1 each by Intrauterine route once.     Magnesium Oxide -Mg Supplement 400 MG CAPS Take 400 mg by mouth 2 (two) times daily. 60 capsule 0   metoCLOPramide (REGLAN) 5 MG tablet Take 1 tablet (5 mg total) by mouth every 6 (six) hours as needed for nausea. 30 tablet 1   ondansetron (ZOFRAN) 8 MG tablet Take 1 tablet (8 mg total) by mouth every 8 (eight) hours as needed for nausea or vomiting.  20 tablet 0   ondansetron (ZOFRAN-ODT) 8 MG disintegrating tablet Take 1 tablet (8 mg total) by mouth every 8 (eight) hours as needed for nausea or vomiting. 20 tablet 1   Ondansetron 4 MG FILM Take 4-8 mg by mouth every 6 (six) hours as needed. 15 each 0   oxyCODONE (ROXICODONE) 5 MG immediate release tablet Take 1 tablet (5 mg total) by mouth every 4 (four) hours as needed for severe pain. (Patient taking differently: Take 5 mg by mouth every 6 (six) hours as needed for severe pain.) 120 tablet 0   pantoprazole (PROTONIX) 40 MG tablet Take 1 tablet (40 mg  total) by mouth daily. 30 tablet 3   polyethylene glycol (MIRALAX / GLYCOLAX) 17 g packet Take 17 g by mouth daily.     predniSONE (DELTASONE) 50 MG tablet Take 1 tablet (50 mg total) by mouth as directed. Take one tablet 13 hours , 1 tablet 7 hours and 1 tablet 1 hour prior to CT scan with IV contrast.  Take Benadryl 50 mg po 1 hour before CT scan 3 tablet 5   Repotrectinib 40 MG CAPS Take 160 mg by mouth daily. (Patient taking differently: Take 160 mg by mouth 2 (two) times daily.) 56 capsule 0   rivaroxaban (XARELTO) 20 MG TABS tablet Take 1 tablet (20 mg total) by mouth daily with supper. 90 tablet 1   zinc sulfate 220 (50 Zn) MG capsule Take 220 mg by mouth daily.     Current Facility-Administered Medications  Medication Dose Route Frequency Provider Last Rate Last Admin   famotidine (PEPCID) tablet 20 mg  20 mg Oral Once Edsel Petrin, DO        SURGICAL HISTORY:  Past Surgical History:  Procedure Laterality Date   ABDOMINAL AORTOGRAM W/LOWER EXTREMITY N/A 11/27/2022   Procedure: ABDOMINAL AORTOGRAM W/LOWER EXTREMITY;  Surgeon: Leonie Douglas, MD;  Location: [facility];  Service: Cardiovascular;  Laterality: N/A;   IR PERC PLEURAL DRAIN W/INDWELL CATH W/IMG GUIDE  11/09/2022   PERIPHERAL VASCULAR ATHERECTOMY  11/27/2022   Procedure: PERIPHERAL VASCULAR ATHERECTOMY;  Surgeon: Leonie Douglas, MD;  Location: [facility];  Service: Cardiovascular;;   PERIPHERAL VASCULAR THROMBECTOMY  11/27/2022   Procedure: PERIPHERAL VASCULAR THROMBECTOMY;  Surgeon: Leonie Douglas, MD;  Location: [facility];  Service: Cardiovascular;;   REMOVAL OF PLEURAL DRAINAGE CATHETER N/A 12/17/2022   Procedure: MINOR REMOVAL OF PLEURAL DRAINAGE CATHETER;  Surgeon: Josephine Igo, DO;  Location: [facility];  Service: Cardiopulmonary;  Laterality: N/A;   WISDOM TOOTH EXTRACTION      REVIEW OF SYSTEMS:  Constitutional: negative Eyes: negative Ears, nose, mouth, throat, and face:  negative Respiratory: negative Cardiovascular: negative Gastrointestinal: negative Genitourinary:negative Integument/breast: negative Hematologic/lymphatic: negative Musculoskeletal:negative Neurological: positive for paresthesia Behavioral/Psych: negative Endocrine: negative Allergic/Immunologic: negative   PHYSICAL EXAMINATION: General appearance: alert, cooperative, and no distress Head: Normocephalic, without obvious abnormality, atraumatic Neck: no adenopathy, no JVD, supple, symmetrical, trachea midline, and thyroid not enlarged, symmetric, no tenderness/mass/nodules Lymph nodes: Cervical, supraclavicular, and axillary nodes normal. Resp: clear to auscultation bilaterally Back: symmetric, no curvature. ROM normal. No CVA tenderness. Cardio: regular rate and rhythm, S1, S2 normal, no murmur, click, rub or gallop GI: soft, non-tender; bowel sounds normal; no masses,  no organomegaly Extremities: extremities normal, atraumatic, no cyanosis or edema Neurologic: Alert and oriented X 3, normal strength and tone. Normal symmetric reflexes. Normal coordination and gait  ECOG PERFORMANCE STATUS: 0 - Asymptomatic  Blood  pressure (!) 140/68, pulse 87, temperature 97.6 F (36.4 C), temperature source Oral, resp. rate 16, height 5\' 6"  (1.676 m), weight 157 lb 8 oz (71.4 kg), SpO2 100%, not currently breastfeeding.  LABORATORY DATA: Lab Results  Component Value Date   WBC 7.0 04/01/2023   HGB 11.3 (L) 04/01/2023   HCT 33.9 (L) 04/01/2023   MCV 89.7 04/01/2023   PLT 536 (H) 04/01/2023      Chemistry      Component Value Date/Time   NA 140 04/01/2023 1559   NA 140 11/24/2020 1106   K 3.7 04/01/2023 1559   CL 106 04/01/2023 1559   CO2 25 04/01/2023 1559   BUN 16 04/01/2023 1559   BUN 7 11/24/2020 1106   CREATININE 0.86 04/01/2023 1559      Component Value Date/Time   CALCIUM 10.1 04/01/2023 1559   ALKPHOS 70 04/01/2023 1559   AST 20 04/01/2023 1559   ALT 19 04/01/2023 1559    BILITOT 0.3 04/01/2023 1559       RADIOGRAPHIC STUDIES: VAS [place] LOWER EXTREMITY ARTERIAL DUPLEX  Result Date: 05/13/2023 LOWER EXTREMITY ARTERIAL DUPLEX STUDY Patient Name:  Melissa Pineda  Date of Exam:   05/12/2023 Medical Rec #: [id number]     Accession #:    [id number] Date of Birth: Nov 05, 1985      Patient Gender: F Patient Age:   71 years Exam Location:  [facility] Procedure:      VAS [place] LOWER EXTREMITY ARTERIAL DUPLEX Referring Phys: Heath Lark --------------------------------------------------------------------------------  Other Factors: For the past week or so the patient feels like her left leg gets                heavy with ambulation; she pushes through. No change in right                leg.  Vascular Interventions: 11/27/22: mechanical thrombectomy of left profunda femoris                         artery                         mechanical.thrombectomy of left popliteal artery.                         Left ATA laser atherectomy and angioplasty. Current ABI:            Right 0.81/0.58 Left 0.91/0.62 Comparison Study: 01/07/23: Right occluded distal popliteal artery. Left Occluded                   mid popliteal artery. Performing Technologist: Thereasa Parkin RVT  Examination Guidelines: A complete evaluation includes B-mode imaging, spectral Doppler, color Doppler, and power Doppler as needed of all accessible portions of each vessel. Bilateral testing is considered an integral part of a complete examination. Limited examinations for reoccurring indications may be performed as noted.  +-----------+--------+-----+--------+---------+--------------------------------+ RIGHT      PSV cm/sRatioStenosisWaveform Comments                         +-----------+--------+-----+--------+---------+--------------------------------+ CFA Distal 158                  triphasic                                 +-----------+--------+-----+--------+---------+--------------------------------+  DFA        128                  triphasic                                 +-----------+--------+-----+--------+---------+--------------------------------+ SFA Prox   116                  triphasic                                 +-----------+--------+-----+--------+---------+--------------------------------+ SFA Mid    104                  triphasic                                 +-----------+--------+-----+--------+---------+--------------------------------+ SFA Distal 65                   triphasic                                 +-----------+--------+-----+--------+---------+--------------------------------+ POP Prox   44                   triphasic                                 +-----------+--------+-----+--------+---------+--------------------------------+ POP Distal 55                   triphasic                                 +-----------+--------+-----+--------+---------+--------------------------------+ ATA Prox   30                   biphasic                                  +-----------+--------+-----+--------+---------+--------------------------------+ ATA Mid    33                   biphasic                                  +-----------+--------+-----+--------+---------+--------------------------------+ ATA Distal 28                   triphasic                                 +-----------+--------+-----+--------+---------+--------------------------------+ PTA Prox                occluded         reconstitution via geniculate                                             artery                           +-----------+--------+-----+--------+---------+--------------------------------+  PTA Mid    39                   triphasic                                 +-----------+--------+-----+--------+---------+--------------------------------+ PTA Distal 52                                                              +-----------+--------+-----+--------+---------+--------------------------------+ PERO Prox  76                                                             +-----------+--------+-----+--------+---------+--------------------------------+ PERO Mid                                 not visualized                   +-----------+--------+-----+--------+---------+--------------------------------+ PERO Distal10                            Via collateral. Bi directional                                            flow proximal to collateral      +-----------+--------+-----+--------+---------+--------------------------------+ A focal velocity elevation of 76 cm/s was obtained at Proximal peroneal with a VR of 1.8.  +-----------+--------+-----+--------+---------+----------------------------+ LEFT       PSV cm/sRatioStenosisWaveform Comments                     +-----------+--------+-----+--------+---------+----------------------------+ CFA Distal 151                  triphasic                             +-----------+--------+-----+--------+---------+----------------------------+ DFA        105                  biphasic                              +-----------+--------+-----+--------+---------+----------------------------+ SFA Prox   123                  triphasic                             +-----------+--------+-----+--------+---------+----------------------------+ SFA Mid    102                  triphasic                             +-----------+--------+-----+--------+---------+----------------------------+ SFA Distal 75  triphasic                             +-----------+--------+-----+--------+---------+----------------------------+ POP Prox   34                   biphasic                              +-----------+--------+-----+--------+---------+----------------------------+ POP Distal 47                   triphasic                              +-----------+--------+-----+--------+---------+----------------------------+ ATA Prox                                 sample not taken             +-----------+--------+-----+--------+---------+----------------------------+ ATA Mid    27                   biphasic                              +-----------+--------+-----+--------+---------+----------------------------+ ATA Distal 23                   biphasic                              +-----------+--------+-----+--------+---------+----------------------------+ PTA Prox   45                   biphasic                              +-----------+--------+-----+--------+---------+----------------------------+ PTA Mid    32                   biphasic                              +-----------+--------+-----+--------+---------+----------------------------+ PTA Distal 30                   biphasic                              +-----------+--------+-----+--------+---------+----------------------------+ PERO Prox  32           occluded         to-fro proximal to occlusion +-----------+--------+-----+--------+---------+----------------------------+ PERO Mid   14                   biphasic                              +-----------+--------+-----+--------+---------+----------------------------+ PERO Distal                              not visualized               +-----------+--------+-----+--------+---------+----------------------------+  Summary: Right: Proximal PTA occlusion with reconstitution via geniculate artery. Probable peroneal artery occlusion with  distal recanalization. Left: Occluded proximal peroneal artery.  See table(s) above for measurements and observations. Electronically signed by Heath Lark on 05/12/2023 at 11:50:32 AM.    Final (Updated)    VAS [place] ABI WITH/WO TBI  Result Date: 05/07/2023  LOWER EXTREMITY DOPPLER STUDY Patient Name:  JONIE BURDELL  Date of Exam:    05/07/2023 Medical Rec #: [id number]     Accession #:    [id number] Date of Birth: 1985/11/11      Patient Gender: F Patient Age:   63 years Exam Location:  [facility] Procedure:      VAS [place] ABI WITH/WO TBI Referring Phys: Heath Lark --------------------------------------------------------------------------------  Indications: Triage 05/06/23: Left leg tingling, falling asleep at random times              despite different positions, pain in foot and behind knee, pt              extremely worried. Pt denies any discoloration, warmth, coldness,              redness, or swelling Other Factors: For the past week or so the patient feels like her left leg gets                heavy with ambulation; she pushes through. No change in right                leg.  Vascular Interventions: 11/27/22: mechanical thrombectomy of left profunda femoris                         artery                         mechanical.thrombectomy of left popliteal artery.                         Left ATA laser atherectomy and angioplasty. Performing Technologist: Thereasa Parkin RVT  Examination Guidelines: A complete evaluation includes at minimum, Doppler waveform signals and systolic blood pressure reading at the level of bilateral brachial, anterior tibial, and posterior tibial arteries, when vessel segments are accessible. Bilateral testing is considered an integral part of a complete examination. Photoelectric Plethysmograph (PPG) waveforms and toe systolic pressure readings are included as required and additional duplex testing as needed. Limited examinations for reoccurring indications may be performed as noted.  ABI Findings: +---------+------------------+-----+---------+--------+ Right    Rt Pressure (mmHg)IndexWaveform Comment  +---------+------------------+-----+---------+--------+ Brachial 110                                      +---------+------------------+-----+---------+--------+ PTA      90                 0.81 triphasic         +---------+------------------+-----+---------+--------+ DP       84                0.76 triphasic         +---------+------------------+-----+---------+--------+ Great Toe64                0.58                   +---------+------------------+-----+---------+--------+ +---------+------------------+-----+---------+-------+ Left     Lt Pressure (mmHg)IndexWaveform Comment +---------+------------------+-----+---------+-------+ Brachial 111                                     +---------+------------------+-----+---------+-------+  PTA      101               0.91 triphasic        +---------+------------------+-----+---------+-------+ DP       80                0.72 triphasic        +---------+------------------+-----+---------+-------+ Great Toe69                0.62                  +---------+------------------+-----+---------+-------+ +-------+-----------+-----------+------------+------------+ ABI/TBIToday's ABIToday's TBIPrevious ABIPrevious TBI +-------+-----------+-----------+------------+------------+ Right  0.81       0.58       1.04        0.72         +-------+-----------+-----------+------------+------------+ Left   0.91       0.62       1.03        0.72         +-------+-----------+-----------+------------+------------+  Previous ABI on 04/08/23.  Summary: Right: Resting right ankle-brachial index indicates mild right lower extremity arterial disease. The right toe-brachial index is abnormal. Left: Resting left ankle-brachial index indicates mild left lower extremity arterial disease. The left toe-brachial index is abnormal. *See table(s) above for measurements and observations.  Electronically signed by Lemar Livings MD on 05/07/2023 at 5:23:00 PM.    Final     ASSESSMENT AND PLAN: This is a very pleasant 37 years old white female with  Stage IVB (T4, N3, M1b) non-small cell lung cancer, adenocarcinoma presented with large  left diaphragmatic surface mass in addition to left hilar, infrahilar, AP window, right and left paratracheal, subcarinal as well as left internal mammary lymphadenopathy in addition to right gastric lymph node and solitary small right frontal calvarial osseous metastatic disease with large malignant left pleural effusion diagnosed in January 2024.   Fortunately her molecular studies showed positive ROS1 fusion.  The patient also had PD-L1 expression of 98%. The patient is currently undergoing treatment was started therapy with repotrectinib started November 27, 2022.  Status post 5.5 months of treatment and has been tolerating it fairly well. Repeat CBC today showed persistent mild anemia with hemoglobin of 10.4 and hematocrit 30.7%.  Comprehensive metabolic panel, iron study, magnesium and phosphorus were normal as well as a negative pregnancy test. I recommended for the patient to continue her current treatment with repotrectinib with the same dose. I will see her back for follow-up visit in 6 weeks for evaluation with repeat CT scan of the chest, abdomen and pelvis for restaging of her disease. The patient will continue her evaluation by the palliative care team for symptomatic management. She was advised to call immediately if she has any other concerning symptoms in the interval. The patient voices understanding of current disease status and treatment options and is in agreement with the current care plan.  All questions were answered. The patient knows to call the clinic with any problems, questions or concerns. We can certainly see the patient much sooner if necessary.  The total time spent in the appointment was 30 minutes.  Disclaimer: This note was dictated with voice recognition software. Similar sounding words can inadvertently be transcribed and may not be corrected upon review.

## 2023-05-13 NOTE — Progress Notes (Signed)
Palliative Medicine [facility]  Telephone:([phone]) [phone] [phone]) [phone]   Name: Melissa Pineda Date: 05/13/2023 MRN: [id number]  DOB: November 05, 1985  Patient Care Team: Patient, No Pcp Per as PCP - General (General Practice) Edsel Petrin, DO as Consulting Physician (Hospice and Palliative Medicine)    INTERVAL HISTORY: Melissa Pineda is a 37 y.o. female with  oncologic medical history including newly diagnosed ROS1 Non-Small Cell Lung Cancer, stage IV at time of diagnosis s/p pleurx for malignant pleural effusion and known diffuse metastatic lymphadenopathy and osseous mets (10/2022). Currently on Repotrectinib and tolerating well. Recently underwent thrombectomy due to bilateral lower extremity arterial occlusion per Dr.Hawkens.  Patient is on Plavix, aspirin, Xarelto.  Palliative ask to see for symptom management and goals of care.   SOCIAL HISTORY:    Melissa Pineda reports that she has never smoked. She has never used smokeless tobacco. She reports current alcohol use of about 7.0 standard drinks of alcohol per week. She reports current drug use. Drug: Marijuana.  ADVANCE DIRECTIVES:  None on file  CODE STATUS: Full code  PAST MEDICAL HISTORY: Past Medical History:  Diagnosis Date   Anxiety    [employer] 11/21/2020   Cancer (HCC)     ALLERGIES:  is allergic to hydrocodone, tramadol, and iodinated contrast media.  MEDICATIONS:  Current Outpatient Medications  Medication Sig Dispense Refill   acetaminophen (TYLENOL) 325 MG tablet Take 650 mg by mouth every 6 (six) hours as needed for mild pain (for mild pain.).     ALPRAZolam (XANAX) 0.5 MG tablet Take 1 tablet (0.5 mg total) by mouth 3 (three) times daily as needed for anxiety. 60 tablet 0   aspirin EC 81 MG tablet Take 1 tablet (81 mg total) by mouth daily. Swallow whole. 30 tablet 12   Cholecalciferol (VITAMIN D-3) 125 MCG (5000 UT) TABS Take 1 tablet by mouth daily.     clopidogrel (PLAVIX) 75 MG tablet  Take 1 tablet (75 mg total) by mouth daily. 30 tablet 11   cyanocobalamin 1000 MCG tablet Take 1,000 mcg by mouth daily.     Ferrous Sulfate (IRON HIGH-POTENCY) 142 (45 Fe) MG TBCR Take 142 mg by mouth daily.     levonorgestrel (MIRENA, 52 MG,) 20 MCG/DAY IUD 1 each by Intrauterine route once.     Magnesium Oxide -Mg Supplement 400 MG CAPS Take 400 mg by mouth 2 (two) times daily. 60 capsule 0   metoCLOPramide (REGLAN) 5 MG tablet Take 1 tablet (5 mg total) by mouth every 6 (six) hours as needed for nausea. 30 tablet 1   ondansetron (ZOFRAN) 8 MG tablet Take 1 tablet (8 mg total) by mouth every 8 (eight) hours as needed for nausea or vomiting. 20 tablet 0   ondansetron (ZOFRAN-ODT) 8 MG disintegrating tablet Take 1 tablet (8 mg total) by mouth every 8 (eight) hours as needed for nausea or vomiting. 20 tablet 1   Ondansetron 4 MG FILM Take 4-8 mg by mouth every 6 (six) hours as needed. 15 each 0   oxyCODONE (ROXICODONE) 5 MG immediate release tablet Take 1 tablet (5 mg total) by mouth every 4 (four) hours as needed for severe pain. (Patient taking differently: Take 5 mg by mouth every 6 (six) hours as needed for severe pain.) 120 tablet 0   pantoprazole (PROTONIX) 40 MG tablet Take 1 tablet (40 mg total) by mouth daily. 30 tablet 3   polyethylene glycol (MIRALAX / GLYCOLAX) 17 g packet Take 17 g by  mouth daily.     predniSONE (DELTASONE) 50 MG tablet Take 1 tablet (50 mg total) by mouth as directed. Take one tablet 13 hours , 1 tablet 7 hours and 1 tablet 1 hour prior to CT scan with IV contrast.  Take Benadryl 50 mg po 1 hour before CT scan 3 tablet 5   Repotrectinib 40 MG CAPS Take 160 mg by mouth daily. (Patient taking differently: Take 160 mg by mouth 2 (two) times daily.) 56 capsule 0   rivaroxaban (XARELTO) 20 MG TABS tablet Take 1 tablet (20 mg total) by mouth daily with supper. 90 tablet 1   zinc sulfate 220 (50 Zn) MG capsule Take 220 mg by mouth daily.     Current Facility-Administered  Medications  Medication Dose Route Frequency Provider Last Rate Last Admin   famotidine (PEPCID) tablet 20 mg  20 mg Oral Once Anderson Malta L, DO        VITAL SIGNS: There were no vitals taken for this visit. There were no vitals filed for this visit.  Estimated body mass index is 25.5 kg/m as calculated from the following:   Height as of 05/12/23: 5\' 6"  (1.676 m).   Weight as of 05/12/23: 158 lb (71.7 kg).   PERFORMANCE STATUS (ECOG) : 1 - Symptomatic but completely ambulatory   Physical Exam General: NAD Cardiovascular: regular rate and rhythm Pulmonary: normal breathing pattern  Neurological: alert, oriented x 4, appropriate mood and affect  IMPRESSION: Melissa Pineda presents to clinic today for follow-up. No acute distress. Is doing great. No complaints. Is working full-time. Able to work from home most days. Caring for her son. Shares recent visit with vascular due to some leg pain. DVT ruled out which she is thankful for.   Occasional fatigue. Daily iron supplements.Is able to be as active as she desires. Appetite is great. Denies nausea, vomiting, diarrhea, or constipation. Xanax as needed for anxiety. Does not require daily.   No symptom management needs at this time. She knows palliative is available as needed.   PLAN: Ongoing goals of care and symptom management support Continue Xanax as needed.   Increase Iron to twice daily. Take with Vitamin C or orange juice. Vitamin B12 supplement daily. Palliative will plan to see patient back in 6-8 weeks in collaboration to other oncology appointments.   Patient expressed understanding and was in agreement with this plan. She also understands that she can call the clinic at any time with any questions, concerns, or complaints.   Any controlled substances utilized were prescribed in the context of palliative care. PDMP has been reviewed.    Visit consisted of counseling and education dealing with the complex and emotionally  intense issues of symptom management and palliative care in the setting of serious and potentially life-threatening illness.Greater than 50%  of this time was spent counseling and coordinating care related to the above assessment and plan.  Willette Alma, AGPCNP-BC  Palliative Medicine Team/[facility]  *Please note that this is a verbal dictation therefore any spelling or grammatical errors are due to the "Dragon Medical One" system interpretation.

## 2023-05-19 ENCOUNTER — Encounter: Payer: Self-pay | Admitting: Physician Assistant

## 2023-05-19 ENCOUNTER — Inpatient Hospital Stay: Payer: BC Managed Care – PPO

## 2023-05-19 ENCOUNTER — Ambulatory Visit: Payer: BC Managed Care – PPO | Attending: Physician Assistant | Admitting: Physician Assistant

## 2023-05-19 ENCOUNTER — Inpatient Hospital Stay: Payer: BC Managed Care – PPO | Admitting: Internal Medicine

## 2023-05-19 VITALS — BP 118/64 | HR 76 | Ht 66.0 in | Wt 162.0 lb

## 2023-05-19 DIAGNOSIS — I739 Peripheral vascular disease, unspecified: Secondary | ICD-10-CM

## 2023-05-19 NOTE — Progress Notes (Addendum)
  Cardiology Office Note:  .   Date:  05/19/2023  ID:  Melissa Pineda, DOB 1986-07-31, MRN [id number] PCP: Patient, No Pcp Per  [facility] Providers Cardiologist:  None { Dr. Mayford Knife    History of Present Illness: .   Melissa Pineda is a 37 y.o. female with a PMH of lung cancer  (non-small cell) diagnosed 10/2022, arterial blood clots in her legs s/p thrombectomy by Dr. Butch Penny, on triple therapy asa, plavix, and xarelto here for follow-up.  She had a new CT of her chest which showed a heart mass (march 2024). At this time she had an embolic stroke back in March as well. She was trying to step down from triple therapy but a TEE was recommended (the mass was not noticed on her last few recent CT scan). Other PMH includes GERD (likely due to xarelto and its GI side effects).   TEE explained to the patient in detail today and she agrees to proceed. Would prefer a Friday for the procedure.   Reports no shortness of breath nor dyspnea on exertion. Reports no chest pain, pressure, or tightness. No edema, orthopnea, PND. Reports no palpitations.   ROS: pertinent ROS in HPI  Studies Reviewed: .        Ct SCAN 01/01/23 IMPRESSION: 1. No evidence of pulmonary embolus. 2. Overall positive response to therapy, with marked decrease in the left lower lobe mass, diminished left pleural effusion and pleural thickening, markedly diminished mediastinal and hilar adenopathy, and resolution of the right middle lobe nodule seen previously. 3. Patchy peripheral consolidation within the left lower lobe, which could reflect postobstructive change or pneumonia. 4. Resolution of the left atrial thrombus seen on prior exam.       Physical Exam:   VS:  BP 118/64   Pulse 76   Ht 5\' 6"  (1.676 m)   Wt 162 lb (73.5 kg)   SpO2 99%   BMI 26.15 kg/m    Wt Readings from Last 3 Encounters:  05/19/23 162 lb (73.5 kg)  05/13/23 157 lb 8 oz (71.4 kg)  05/12/23 158 lb (71.7 kg)    GEN: Well nourished, well  developed in no acute distress NECK: No JVD; No carotid bruits CARDIAC: RRR, no murmurs, rubs, gallops RESPIRATORY:  Clear to auscultation without rales, wheezing or rhonchi  ABDOMEN: Soft, non-tender, non-distended EXTREMITIES:  No edema; No deformity   ASSESSMENT AND PLAN: .   Left atrial mass/thrombus -not seen on recent CT scan in March -remains on triple therapy with ASA, Xarelto, and Plavix  -Plan for TEE for further evaluation  Non-small cell lung CA -responding to therapy (Repotrectinib) -continue heme/onc follow-up  Hx of embolic stroke/vascular surgery for LE thrombus -thankfully no reoccurrence on triple therapy -no LE edema or pain today -close follow-up with vascular recommended -discussed changing her IUD to a non-hormonal if applicable, to discuss with GYN   -never smoked   Dispo: She will follow-up after TEE to discuss results.    Addendum: d/w Dr. Tenny Craw and she does not think TEE is appropriate at this time. She is recommending the following in regards to anticoagulation/antiplatelet therapy: "We recommended continuing Xarelto, Aspirin, Plavix for the next 3 months.  Following that, plavix could be discontinued. Long term anticoagulation will be recommended, Xarelto is appropriate." Which is the same as heme/onc back in June.  Will forward to Dr. Mayford Knife as an Lorain Childes.   Signed, Sharlene Dory, PA-C

## 2023-05-19 NOTE — Patient Instructions (Signed)
Medication Instructions:  Your physician recommends that you continue on your current medications as directed. Please refer to the Current Medication list given to you today.  *If you need a refill on your cardiac medications before your next appointment, please call your pharmacy*   Lab Work: BMET today.  If you have labs (blood work) drawn today and your tests are completely normal, you will receive your results only by: MyChart Message (if you have MyChart) OR A paper copy in the mail If you have any lab test that is abnormal or we need to change your treatment, we will call you to review the results.   Testing/Procedures:     Dear Melissa Pineda  You are scheduled for a TEE (Transesophageal Echocardiogram) on Friday, August 9 with Dr. Tenny Craw.  Please arrive at the [facility] (Main Entrance A) at [facility]: [address] [place], [place] [address] at 6:30 AM (This time is 1.5 hour(s) before your procedure to ensure your preparation). Free valet parking service is available. You will check in at ADMITTING. The support person will be asked to wait in the waiting room.  It is OK to have someone drop you off and come back when you are ready to be discharged.      DIET:  Nothing to eat or drink after midnight except a sip of water with medications (see medication instructions below)     Continue taking your anticoagulant (blood thinner): Rivaroxaban (Xarelto).  You will need to continue this after your procedure until you are told by your provider that it is safe to stop.    LABS:  CBC was done 05/13/23. BMET will be done today.  FYI:  For your safety, and to allow [place] to monitor your vital signs accurately during the surgery/procedure we request: If you have artificial nails, gel coating, SNS etc, please have those removed prior to your surgery/procedure. Not having the nail coverings /polish removed may result in cancellation or delay of your surgery/procedure.  You must  have a responsible person to drive you home and stay in the waiting area during your procedure. Failure to do so could result in cancellation.  Bring your insurance cards.  *Special Note: Every effort is made to have your procedure done on time. Occasionally there are emergencies that occur at the hospital that may cause delays. Please be patient if a delay does occur.       Follow-Up: At [facility], you and your health needs are our priority.  As part of our continuing mission to provide you with exceptional heart care, we have created designated Provider Care Teams.  These Care Teams include your primary Cardiologist (physician) and Advanced Practice Providers (APPs -  Physician Assistants and Nurse Practitioners) who all work together to provide you with the care you need, when you need it.  We recommend signing up for the patient portal called "MyChart".  Sign up information is provided on this After Visit Summary.  MyChart is used to connect with patients for Virtual Visits (Telemedicine).  Patients are able to view lab/test results, encounter notes, upcoming appointments, etc.  Non-urgent messages can be sent to your provider as well.   To learn more about what you can do with MyChart, go to [email or web address].    Your next appointment:   2 week(s)  Provider:   Jari Favre, PA-C

## 2023-05-19 NOTE — H&P (View-Only) (Signed)
  Cardiology Office Note:  .   Date:  05/19/2023  ID:  Melissa Pineda, DOB 1986-07-31, MRN [id number] PCP: Patient, No Pcp Per  [facility] Providers Cardiologist:  None { Dr. Mayford Knife    History of Present Illness: .   Melissa Pineda is a 37 y.o. female with a PMH of lung cancer  (non-small cell) diagnosed 10/2022, arterial blood clots in her legs s/p thrombectomy by Dr. Butch Penny, on triple therapy asa, plavix, and xarelto here for follow-up.  She had a new CT of her chest which showed a heart mass (march 2024). At this time she had an embolic stroke back in March as well. She was trying to step down from triple therapy but a TEE was recommended (the mass was not noticed on her last few recent CT scan). Other PMH includes GERD (likely due to xarelto and its GI side effects).   TEE explained to the patient in detail today and she agrees to proceed. Would prefer a Friday for the procedure.   Reports no shortness of breath nor dyspnea on exertion. Reports no chest pain, pressure, or tightness. No edema, orthopnea, PND. Reports no palpitations.   ROS: pertinent ROS in HPI  Studies Reviewed: .        Ct SCAN 01/01/23 IMPRESSION: 1. No evidence of pulmonary embolus. 2. Overall positive response to therapy, with marked decrease in the left lower lobe mass, diminished left pleural effusion and pleural thickening, markedly diminished mediastinal and hilar adenopathy, and resolution of the right middle lobe nodule seen previously. 3. Patchy peripheral consolidation within the left lower lobe, which could reflect postobstructive change or pneumonia. 4. Resolution of the left atrial thrombus seen on prior exam.       Physical Exam:   VS:  BP 118/64   Pulse 76   Ht 5\' 6"  (1.676 m)   Wt 162 lb (73.5 kg)   SpO2 99%   BMI 26.15 kg/m    Wt Readings from Last 3 Encounters:  05/19/23 162 lb (73.5 kg)  05/13/23 157 lb 8 oz (71.4 kg)  05/12/23 158 lb (71.7 kg)    GEN: Well nourished, well  developed in no acute distress NECK: No JVD; No carotid bruits CARDIAC: RRR, no murmurs, rubs, gallops RESPIRATORY:  Clear to auscultation without rales, wheezing or rhonchi  ABDOMEN: Soft, non-tender, non-distended EXTREMITIES:  No edema; No deformity   ASSESSMENT AND PLAN: .   Left atrial mass/thrombus -not seen on recent CT scan in March -remains on triple therapy with ASA, Xarelto, and Plavix  -Plan for TEE for further evaluation  Non-small cell lung CA -responding to therapy (Repotrectinib) -continue heme/onc follow-up  Hx of embolic stroke/vascular surgery for LE thrombus -thankfully no reoccurrence on triple therapy -no LE edema or pain today -close follow-up with vascular recommended -discussed changing her IUD to a non-hormonal if applicable, to discuss with GYN   -never smoked   Dispo: She will follow-up after TEE to discuss results.    Addendum: d/w Dr. Tenny Craw and she does not think TEE is appropriate at this time. She is recommending the following in regards to anticoagulation/antiplatelet therapy: "We recommended continuing Xarelto, Aspirin, Plavix for the next 3 months.  Following that, plavix could be discontinued. Long term anticoagulation will be recommended, Xarelto is appropriate." Which is the same as heme/onc back in June.  Will forward to Dr. Mayford Knife as an Lorain Childes.   Signed, Sharlene Dory, PA-C

## 2023-05-26 ENCOUNTER — Ambulatory Visit: Payer: BC Managed Care – PPO | Attending: Physician Assistant

## 2023-05-26 DIAGNOSIS — I739 Peripheral vascular disease, unspecified: Secondary | ICD-10-CM | POA: Diagnosis not present

## 2023-05-29 ENCOUNTER — Telehealth: Payer: Self-pay | Admitting: Physician Assistant

## 2023-05-29 NOTE — Telephone Encounter (Signed)
Patient aware to cancel TEE.   She states she needs to speak with you because she is not understanding what is happening and trying to put the pieces together. She wanted to know if you spoke with her vascular provider. Do she need medication changes. Is the clot still there?

## 2023-05-29 NOTE — Telephone Encounter (Signed)
The patient mentioned that she missed a call from [facility] regarding her upcoming procedure.

## 2023-05-29 NOTE — Telephone Encounter (Signed)
Patient stated she missed a call from provider about TEE that is scheduled for tomorrow. I do not see any notes or encounter.  She is aware of lab results.

## 2023-05-30 ENCOUNTER — Encounter (HOSPITAL_COMMUNITY): Admission: RE | Payer: Self-pay | Source: Home / Self Care

## 2023-05-30 ENCOUNTER — Ambulatory Visit (HOSPITAL_COMMUNITY)
Admission: RE | Admit: 2023-05-30 | Payer: BC Managed Care – PPO | Source: Home / Self Care | Admitting: Internal Medicine

## 2023-05-30 SURGERY — TRANSESOPHAGEAL ECHOCARDIOGRAM (TEE)
Anesthesia: Monitor Anesthesia Care

## 2023-05-30 NOTE — Telephone Encounter (Signed)
Attempted to reach pt to inform her of Tessa's response.  No answer. Forwarding back  to Tessa to inform pt as she is going to try and call her today.

## 2023-06-09 NOTE — Progress Notes (Signed)
Spoke to pt and instructed them to come at 0830 and to be NPO after 0000.   Confirmed that pt will have a ride home and someone to stay with them for 24 hours after the procedure.

## 2023-06-09 NOTE — Anesthesia Preprocedure Evaluation (Signed)
Anesthesia Evaluation  Patient identified by MRN, date of birth, ID band Patient awake    Reviewed: Allergy & Precautions, NPO status , Patient's Chart, lab work & pertinent test results  History of Anesthesia Complications Negative for: history of anesthetic complications  Airway Mallampati: II  TM Distance: >3 FB Neck ROM: Full    Dental  (+) Dental Advisory Given   Pulmonary neg shortness of breath, neg sleep apnea, neg COPD, neg recent URI Stage 4 lung cancer   Pulmonary exam normal breath sounds clear to auscultation       Cardiovascular (-) hypertension(-) angina (-) Past MI, (-) Cardiac Stents and (-) CABG (-) dysrhythmias  Rhythm:Regular Rate:Normal  TTE 11/27/2022: IMPRESSIONS    1. Left ventricular ejection fraction, by estimation, is 50 to 55%. The  left ventricle has low normal function. The left ventricle has no regional  wall motion abnormalities. Left ventricular diastolic parameters were  normal.   2. Right ventricular systolic function is normal. The right ventricular  size is normal.   3. The mitral valve is normal in structure. No evidence of mitral valve  regurgitation. No evidence of mitral stenosis.   4. The aortic valve was not well visualized. Aortic valve regurgitation  is not visualized. No aortic stenosis is present.   5. The inferior vena cava is normal in size with greater than 50%  respiratory variability, suggesting right atrial pressure of 3 mmHg.     Neuro/Psych neg Seizures PSYCHIATRIC DISORDERS Anxiety     CVA    GI/Hepatic Neg liver ROS,GERD  Medicated,,  Endo/Other  negative endocrine ROS    Renal/GU negative Renal ROS     Musculoskeletal   Abdominal   Peds  Hematology negative hematology ROS (+)   Anesthesia Other Findings 37 y.o. female with a PMH of lung cancer  (non-small cell) diagnosed 10/2022, arterial blood clots in her legs s/p thrombectomy by Dr. Butch Penny, on triple  therapy asa, plavix, and xarelto. She had a new CT of her chest which showed a heart mass (march 2024). At this time she had an embolic stroke back in March as well.   Last Plavix:  Last Xarelto:  Reproductive/Obstetrics                             Anesthesia Physical Anesthesia Plan  ASA: 4  Anesthesia Plan: MAC   Post-op Pain Management: Minimal or no pain anticipated   Induction: Intravenous  PONV Risk Score and Plan: 2 and Propofol infusion, TIVA and Treatment may vary due to age or medical condition  Airway Management Planned: Natural Airway and Nasal Cannula  Additional Equipment:   Intra-op Plan:   Post-operative Plan:   Informed Consent: I have reviewed the patients History and Physical, chart, labs and discussed the procedure including the risks, benefits and alternatives for the proposed anesthesia with the patient or authorized representative who has indicated his/her understanding and acceptance.     Dental advisory given  Plan Discussed with: CRNA and Anesthesiologist  Anesthesia Plan Comments: (Discussed with patient risks of MAC including, but not limited to, minor pain or discomfort, hearing people in the room, and possible need for backup general anesthesia. Risks for general anesthesia also discussed including, but not limited to, sore throat, hoarse voice, chipped/damaged teeth, injury to vocal cords, nausea and vomiting, allergic reactions, lung infection, heart attack, stroke, and death. All questions answered. )       Anesthesia Quick Evaluation

## 2023-06-10 ENCOUNTER — Encounter (HOSPITAL_COMMUNITY)
Admission: RE | Disposition: A | Discharge status: Home / Self Care | Payer: Self-pay | Source: Home / Self Care | Attending: Cardiovascular Disease

## 2023-06-10 ENCOUNTER — Ambulatory Visit (HOSPITAL_BASED_OUTPATIENT_CLINIC_OR_DEPARTMENT_OTHER)
Admission: RE | Admit: 2023-06-10 | Discharge: 2023-06-10 | Disposition: A | Discharge status: Home / Self Care | Payer: BC Managed Care – PPO | Source: Ambulatory Visit | Attending: Cardiovascular Disease | Admitting: Cardiovascular Disease

## 2023-06-10 ENCOUNTER — Ambulatory Visit (HOSPITAL_COMMUNITY): Payer: BC Managed Care – PPO | Admitting: Anesthesiology

## 2023-06-10 ENCOUNTER — Ambulatory Visit (HOSPITAL_COMMUNITY)
Admission: RE | Admit: 2023-06-10 | Discharge: 2023-06-10 | Disposition: A | Discharge status: Home / Self Care | Payer: BC Managed Care – PPO | Attending: Cardiovascular Disease | Admitting: Cardiovascular Disease

## 2023-06-10 ENCOUNTER — Other Ambulatory Visit: Payer: Self-pay | Admitting: Physician Assistant

## 2023-06-10 ENCOUNTER — Ambulatory Visit: Payer: BC Managed Care – PPO | Admitting: Physician Assistant

## 2023-06-10 ENCOUNTER — Encounter (HOSPITAL_COMMUNITY): Payer: Self-pay | Admitting: Cardiovascular Disease

## 2023-06-10 ENCOUNTER — Other Ambulatory Visit: Payer: Self-pay

## 2023-06-10 DIAGNOSIS — Q2112 Patent foramen ovale: Secondary | ICD-10-CM | POA: Diagnosis not present

## 2023-06-10 DIAGNOSIS — C3492 Malignant neoplasm of unspecified part of left bronchus or lung: Secondary | ICD-10-CM | POA: Diagnosis not present

## 2023-06-10 DIAGNOSIS — Z7902 Long term (current) use of antithrombotics/antiplatelets: Secondary | ICD-10-CM | POA: Diagnosis not present

## 2023-06-10 DIAGNOSIS — C3490 Malignant neoplasm of unspecified part of unspecified bronchus or lung: Secondary | ICD-10-CM | POA: Diagnosis not present

## 2023-06-10 DIAGNOSIS — I639 Cerebral infarction, unspecified: Secondary | ICD-10-CM | POA: Diagnosis not present

## 2023-06-10 DIAGNOSIS — Z7901 Long term (current) use of anticoagulants: Secondary | ICD-10-CM | POA: Insufficient documentation

## 2023-06-10 DIAGNOSIS — K219 Gastro-esophageal reflux disease without esophagitis: Secondary | ICD-10-CM | POA: Insufficient documentation

## 2023-06-10 DIAGNOSIS — Z7982 Long term (current) use of aspirin: Secondary | ICD-10-CM | POA: Diagnosis not present

## 2023-06-10 DIAGNOSIS — Z86718 Personal history of other venous thrombosis and embolism: Secondary | ICD-10-CM | POA: Insufficient documentation

## 2023-06-10 DIAGNOSIS — I63411 Cerebral infarction due to embolism of right middle cerebral artery: Secondary | ICD-10-CM

## 2023-06-10 HISTORY — PX: TEE WITHOUT CARDIOVERSION: SHX5443

## 2023-06-10 LAB — PREGNANCY, URINE: Preg Test, Ur: NEGATIVE

## 2023-06-10 LAB — ECHO TEE

## 2023-06-10 SURGERY — ECHOCARDIOGRAM, TRANSESOPHAGEAL
Anesthesia: Monitor Anesthesia Care

## 2023-06-10 MED ORDER — PROPOFOL 500 MG/50ML IV EMUL
INTRAVENOUS | Status: DC | PRN
  Administered 2023-06-10: 150 ug/kg/min via INTRAVENOUS

## 2023-06-10 MED ORDER — SODIUM CHLORIDE 0.9 % IV SOLN: INTRAVENOUS | Status: DC

## 2023-06-10 MED ORDER — LIDOCAINE HCL (CARDIAC) PF 100 MG/5ML IV SOSY
PREFILLED_SYRINGE | INTRAVENOUS | Status: DC | PRN
  Administered 2023-06-10: 20 mg via INTRATRACHEAL

## 2023-06-10 MED ORDER — PROPOFOL 10 MG/ML IV BOLUS
INTRAVENOUS | Status: DC | PRN
  Administered 2023-06-10 (×2): 50 ug via INTRAVENOUS

## 2023-06-10 NOTE — Transfer of Care (Signed)
Immediate Anesthesia Transfer of Care Note  Patient: Melissa Pineda  Procedure(s) Performed: TRANSESOPHAGEAL ECHOCARDIOGRAM  Patient Location: Cath Lab  Anesthesia Type:MAC  Level of Consciousness: awake, oriented, drowsy, patient cooperative, and responds to stimulation  Airway & Oxygen Therapy: Patient Spontanous Breathing and Patient connected to nasal cannula oxygen  Post-op Assessment: Report given to RN, Post -op Vital signs reviewed and stable, and Patient moving all extremities  Post vital signs: Reviewed and stable  Last Vitals:  Vitals Value Taken Time  BP 105/58 06/10/23 1120  Temp 36.6 C 06/10/23 1120  Pulse 81 06/10/23 1120  Resp 12 06/10/23 1120  SpO2 97 % 06/10/23 1120    Last Pain:  Vitals:   06/10/23 1120  TempSrc: Tympanic  PainSc: 0-No pain         Complications: No notable events documented.  *Report given to Cath Lab RN for continued care and follow-up.

## 2023-06-10 NOTE — Addendum Note (Signed)
Addendum  created 06/10/23 1412 by Earlene Plater, CRNA   Flowsheet accepted

## 2023-06-10 NOTE — Anesthesia Postprocedure Evaluation (Signed)
Anesthesia Post Note  Patient: Melissa Pineda  Procedure(s) Performed: TRANSESOPHAGEAL ECHOCARDIOGRAM     Patient location during evaluation: PACU Anesthesia Type: MAC Level of consciousness: awake Pain management: pain level controlled Vital Signs Assessment: post-procedure vital signs reviewed and stable Respiratory status: spontaneous breathing, nonlabored ventilation and respiratory function stable Cardiovascular status: stable and blood pressure returned to baseline Postop Assessment: no apparent nausea or vomiting Anesthetic complications: no   No notable events documented.  Last Vitals:  Vitals:   06/10/23 1130 06/10/23 1140  BP: 116/64 102/68  Pulse: 79 71  Resp: 16 12  Temp:    SpO2: 98% 97%    Last Pain:  Vitals:   06/10/23 1140  TempSrc:   PainSc: 0-No pain                 Linton Rump

## 2023-06-10 NOTE — Progress Notes (Unsigned)
Structural heart consult placed for PFO.  Sharlene Dory, PA-C

## 2023-06-10 NOTE — Discharge Instructions (Signed)
TEE  YOU HAD AN CARDIAC PROCEDURE TODAY: Refer to the procedure report and other information in the discharge instructions given to you for any specific questions about what was found during the examination. If this information does not answer your questions, please call [facility] office at [phone] to clarify.   DIET: Your first meal following the procedure should be a light meal and then it is ok to progress to your normal diet. A half-sandwich or bowl of soup is an example of a good first meal. Heavy or fried foods are harder to digest and may make you feel nauseous or bloated. Drink plenty of fluids but you should avoid alcoholic beverages for 24 hours. If you had a esophageal dilation, please see attached instructions for diet.   ACTIVITY: Your care partner should take you home directly after the procedure. You should plan to take it easy, moving slowly for the rest of the day. You can resume normal activity the day after the procedure however YOU SHOULD NOT DRIVE, use power tools, machinery or perform tasks that involve climbing or major physical exertion for 24 hours (because of the sedation medicines used during the test).   SYMPTOMS TO REPORT IMMEDIATELY: A cardiologist can be reached at any hour. Please call [phone] for any of the following symptoms:  Vomiting of blood or coffee ground material  New, significant abdominal pain  New, significant chest pain or pain under the shoulder blades  Painful or persistently difficult swallowing  New shortness of breath  Black, tarry-looking or red, bloody stools  FOLLOW UP:  Please also call with any specific questions about appointments or follow up tests.   

## 2023-06-10 NOTE — Progress Notes (Signed)
  Echocardiogram Echocardiogram Transesophageal has been performed.  Melissa Pineda 06/10/2023, 11:37 AM

## 2023-06-10 NOTE — Interval H&P Note (Signed)
History and Physical Interval Note:  06/10/2023 9:46 AM  Melissa Pineda  has presented today for surgery, with the diagnosis of STROKE.  The various methods of treatment have been discussed with the patient and family. After consideration of risks, benefits and other options for treatment, the patient has consented to  Procedure(s): TRANSESOPHAGEAL ECHOCARDIOGRAM (N/A) as a surgical intervention.  The patient's history has been reviewed, patient examined, no change in status, stable for surgery.  I have reviewed the patient's chart and labs.  Questions were answered to the patient's satisfaction.     Chilton Si, MD

## 2023-06-10 NOTE — CV Procedure (Signed)
Brief TEE Note  LVEF 60-65% No LA/LAA thrombus or masses Prominent Chiari network in the right atrium +PFO with right to left flow  For additional details see full report.  Melissa Pineda C. Duke Salvia, MD, [facility] 06/10/2023 11:17 AM

## 2023-06-11 ENCOUNTER — Encounter (HOSPITAL_COMMUNITY): Payer: Self-pay | Admitting: Cardiovascular Disease

## 2023-06-13 ENCOUNTER — Ambulatory Visit: Payer: BC Managed Care – PPO | Admitting: Physician Assistant

## 2023-06-18 ENCOUNTER — Inpatient Hospital Stay: Payer: BC Managed Care – PPO

## 2023-06-25 NOTE — Progress Notes (Deleted)
Palliative Medicine [facility]  Telephone:([phone]) [phone] [phone]   Name: Melissa Pineda Date: 06/25/2023 MRN: [id number]  DOB: 09-02-1986  Patient Care Team: Patient, No Pcp Per as PCP - General (General Practice) Edsel Petrin, DO as Consulting Physician (Hospice and Palliative Medicine)    INTERVAL HISTORY: Melissa Pineda is a 37 y.o. female with  oncologic medical history including newly diagnosed ROS1 Non-Small Cell Lung Cancer, stage IV at time of diagnosis s/p pleurx for malignant pleural effusion and known diffuse metastatic lymphadenopathy and osseous mets (10/2022). Currently on Repotrectinib and tolerating well. Recently underwent thrombectomy due to bilateral lower extremity arterial occlusion per Dr.Hawkens.  Patient is on Plavix, aspirin, Xarelto.  Palliative ask to see for symptom management and goals of care.   SOCIAL HISTORY:    Melissa Pineda reports that she has never smoked. She has never used smokeless tobacco. She reports current alcohol use of about 7.0 standard drinks of alcohol per week. She reports current drug use. Drug: Marijuana.  ADVANCE DIRECTIVES:  None on file  CODE STATUS: Full code  PAST MEDICAL HISTORY: Past Medical History:  Diagnosis Date   Anxiety    Phreesia 11/21/2020   Cancer (HCC)     ALLERGIES:  is allergic to hydrocodone, tramadol, and iodinated contrast media.  MEDICATIONS:  Current Outpatient Medications  Medication Sig Dispense Refill   acetaminophen (TYLENOL) 325 MG tablet Take 650 mg by mouth every 6 (six) hours as needed for mild pain.     ALPRAZolam (XANAX) 0.5 MG tablet Take 1 tablet (0.5 mg total) by mouth 3 (three) times daily as needed for anxiety. 60 tablet 0   ascorbic acid (VITAMIN C) 500 MG tablet Take 500 mg by mouth daily.     aspirin EC 81 MG tablet Take 1 tablet (81 mg total) by mouth daily. Swallow whole. 30 tablet 12   b complex vitamins capsule Take 1 capsule by mouth daily.      Cholecalciferol (VITAMIN D-3 PO) Take 2,000 Units by mouth daily.     clopidogrel (PLAVIX) 75 MG tablet Take 1 tablet (75 mg total) by mouth daily. 30 tablet 11   Ferrous Sulfate (IRON HIGH-POTENCY) 142 (45 Fe) MG TBCR Take 142 mg by mouth daily.     levonorgestrel (MIRENA, 52 MG,) 20 MCG/DAY IUD 1 each by Intrauterine route once.     metoCLOPramide (REGLAN) 5 MG tablet Take 1 tablet (5 mg total) by mouth every 6 (six) hours as needed for nausea. 30 tablet 1   ondansetron (ZOFRAN) 8 MG tablet Take 1 tablet (8 mg total) by mouth every 8 (eight) hours as needed for nausea or vomiting. 20 tablet 0   ondansetron (ZOFRAN-ODT) 8 MG disintegrating tablet Take 1 tablet (8 mg total) by mouth every 8 (eight) hours as needed for nausea or vomiting. 20 tablet 1   Ondansetron 4 MG FILM Take 4-8 mg by mouth every 6 (six) hours as needed. 15 each 0   pantoprazole (PROTONIX) 40 MG tablet Take 1 tablet (40 mg total) by mouth daily. 30 tablet 3   polyethylene glycol (MIRALAX / GLYCOLAX) 17 g packet Take 17 g by mouth daily as needed for moderate constipation.     predniSONE (DELTASONE) 50 MG tablet Take 1 tablet (50 mg total) by mouth as directed. Take one tablet 13 hours , 1 tablet 7 hours and 1 tablet 1 hour prior to CT scan with IV contrast.  Take Benadryl 50 mg po 1 hour before  CT scan 3 tablet 5   Repotrectinib 40 MG CAPS Take 160 mg by mouth daily. (Patient taking differently: Take 160 mg by mouth 2 (two) times daily.) 56 capsule 0   rivaroxaban (XARELTO) 20 MG TABS tablet Take 1 tablet (20 mg total) by mouth daily with supper. 90 tablet 1   Zinc 22.5 MG TABS Take 22.5 mg by mouth daily.     No current facility-administered medications for this visit.    VITAL SIGNS: There were no vitals taken for this visit. There were no vitals filed for this visit.  Estimated body mass index is 25.82 kg/m as calculated from the following:   Height as of 06/10/23: 5\' 6"  (1.676 m).   Weight as of 06/10/23: 160 lb (72.6  kg).   PERFORMANCE STATUS (ECOG) : 1 - Symptomatic but completely ambulatory   Physical Exam General: NAD Cardiovascular: regular rate and rhythm Pulmonary: normal breathing pattern  Neurological: alert, oriented x 4, appropriate mood and affect  IMPRESSION:   PLAN: Ongoing goals of care and symptom management support Continue Xanax as needed.   Increase Iron to twice daily. Take with Vitamin C or orange juice. Vitamin B12 supplement daily. Palliative will plan to see patient back in 6-8 weeks in collaboration to other oncology appointments.   Patient expressed understanding and was in agreement with this plan. She also understands that she can call the clinic at any time with any questions, concerns, or complaints.   Any controlled substances utilized were prescribed in the context of palliative care. PDMP has been reviewed.    Visit consisted of counseling and education dealing with the complex and emotionally intense issues of symptom management and palliative care in the setting of serious and potentially life-threatening illness.Greater than 50%  of this time was spent counseling and coordinating care related to the above assessment and plan.  Willette Alma, AGPCNP-BC  Palliative Medicine Team/[facility]  *Please note that this is a verbal dictation therefore any spelling or grammatical errors are due to the "Dragon Medical One" system interpretation.

## 2023-06-26 ENCOUNTER — Ambulatory Visit: Payer: BC Managed Care – PPO | Admitting: Physician Assistant

## 2023-06-30 ENCOUNTER — Telehealth: Payer: Self-pay | Admitting: Medical Oncology

## 2023-06-30 NOTE — Telephone Encounter (Signed)
Pt knows to pick up prednisone.

## 2023-07-01 ENCOUNTER — Other Ambulatory Visit: Payer: Self-pay | Admitting: Nurse Practitioner

## 2023-07-01 DIAGNOSIS — F419 Anxiety disorder, unspecified: Secondary | ICD-10-CM

## 2023-07-01 DIAGNOSIS — Z515 Encounter for palliative care: Secondary | ICD-10-CM

## 2023-07-01 DIAGNOSIS — C3492 Malignant neoplasm of unspecified part of left bronchus or lung: Secondary | ICD-10-CM

## 2023-07-01 MED ORDER — ALPRAZOLAM 0.5 MG PO TABS: 0.5000 mg | ORAL_TABLET | Freq: Three times a day (TID) | ORAL | 0 refills | Status: DC | PRN

## 2023-07-02 ENCOUNTER — Other Ambulatory Visit: Payer: Self-pay | Admitting: Internal Medicine

## 2023-07-02 ENCOUNTER — Ambulatory Visit (HOSPITAL_COMMUNITY)
Admission: RE | Admit: 2023-07-02 | Discharge: 2023-07-02 | Disposition: A | Discharge status: Home / Self Care | Payer: BC Managed Care – PPO | Source: Ambulatory Visit | Attending: Internal Medicine | Admitting: Internal Medicine

## 2023-07-02 ENCOUNTER — Inpatient Hospital Stay: Payer: BC Managed Care – PPO | Attending: Internal Medicine

## 2023-07-02 ENCOUNTER — Inpatient Hospital Stay: Payer: BC Managed Care – PPO

## 2023-07-02 DIAGNOSIS — J929 Pleural plaque without asbestos: Secondary | ICD-10-CM | POA: Diagnosis not present

## 2023-07-02 DIAGNOSIS — Z793 Long term (current) use of hormonal contraceptives: Secondary | ICD-10-CM | POA: Insufficient documentation

## 2023-07-02 DIAGNOSIS — Z79899 Other long term (current) drug therapy: Secondary | ICD-10-CM | POA: Insufficient documentation

## 2023-07-02 DIAGNOSIS — Z7901 Long term (current) use of anticoagulants: Secondary | ICD-10-CM | POA: Insufficient documentation

## 2023-07-02 DIAGNOSIS — N2889 Other specified disorders of kidney and ureter: Secondary | ICD-10-CM | POA: Diagnosis not present

## 2023-07-02 DIAGNOSIS — C3492 Malignant neoplasm of unspecified part of left bronchus or lung: Secondary | ICD-10-CM | POA: Insufficient documentation

## 2023-07-02 DIAGNOSIS — D649 Anemia, unspecified: Secondary | ICD-10-CM | POA: Insufficient documentation

## 2023-07-02 DIAGNOSIS — Z7902 Long term (current) use of antithrombotics/antiplatelets: Secondary | ICD-10-CM | POA: Insufficient documentation

## 2023-07-02 DIAGNOSIS — Z8774 Personal history of (corrected) congenital malformations of heart and circulatory system: Secondary | ICD-10-CM | POA: Insufficient documentation

## 2023-07-02 DIAGNOSIS — Z7982 Long term (current) use of aspirin: Secondary | ICD-10-CM | POA: Insufficient documentation

## 2023-07-02 DIAGNOSIS — C3490 Malignant neoplasm of unspecified part of unspecified bronchus or lung: Secondary | ICD-10-CM

## 2023-07-02 DIAGNOSIS — C7951 Secondary malignant neoplasm of bone: Secondary | ICD-10-CM | POA: Insufficient documentation

## 2023-07-02 LAB — CMP (CANCER CENTER ONLY)
ALT: 21 U/L (ref 0–44)
AST: 22 U/L (ref 15–41)
Albumin: 5.1 g/dL — ABNORMAL HIGH (ref 3.5–5.0)
Alkaline Phosphatase: 61 U/L (ref 38–126)
Anion gap: 6 (ref 5–15)
BUN: 18 mg/dL (ref 6–20)
CO2: 29 mmol/L (ref 22–32)
Calcium: 9.9 mg/dL (ref 8.9–10.3)
Chloride: 106 mmol/L (ref 98–111)
Creatinine: 0.85 mg/dL (ref 0.44–1.00)
GFR, Estimated: >60 mL/min (ref >60)
Glucose, Bld: 150 mg/dL — ABNORMAL HIGH (ref 70–99)
Potassium: 4.3 mmol/L (ref 3.5–5.1)
Sodium: 141 mmol/L (ref 135–145)
Total Bilirubin: 0.4 mg/dL (ref 0.3–1.2)
Total Protein: 8.1 g/dL (ref 6.5–8.1)

## 2023-07-02 LAB — CBC WITH DIFFERENTIAL (CANCER CENTER ONLY)
Abs Immature Granulocytes: 0.02 10*3/uL (ref 0.00–0.07)
Basophils Absolute: 0 10*3/uL (ref 0.0–0.1)
Basophils Relative: 0 %
Eosinophils Absolute: 0 10*3/uL (ref 0.0–0.5)
Eosinophils Relative: 0 %
HCT: 33.4 % — ABNORMAL LOW (ref 36.0–46.0)
Hemoglobin: 11.2 g/dL — ABNORMAL LOW (ref 12.0–15.0)
Immature Granulocytes: 0 %
Lymphocytes Relative: 10 %
Lymphs Abs: 0.7 10*3/uL (ref 0.7–4.0)
MCH: 31 pg (ref 26.0–34.0)
MCHC: 33.5 g/dL (ref 30.0–36.0)
MCV: 92.5 fL (ref 80.0–100.0)
Monocytes Absolute: 0.1 10*3/uL (ref 0.1–1.0)
Monocytes Relative: 1 %
Neutro Abs: 6.1 10*3/uL (ref 1.7–7.7)
Neutrophils Relative %: 89 %
Platelet Count: 590 10*3/uL — ABNORMAL HIGH (ref 150–400)
RBC: 3.61 MIL/uL — ABNORMAL LOW (ref 3.87–5.11)
RDW: 12.3 % (ref 11.5–15.5)
WBC Count: 6.8 10*3/uL (ref 4.0–10.5)
nRBC: 0 % (ref 0.0–0.2)

## 2023-07-02 MED ORDER — SODIUM CHLORIDE (PF) 0.9 % IJ SOLN
INTRAMUSCULAR | Status: AC
  Filled 2023-07-02: qty 50

## 2023-07-02 MED ORDER — IOHEXOL 300 MG/ML  SOLN
100.0000 mL | Freq: Once | INTRAMUSCULAR | Status: AC | PRN
  Administered 2023-07-02: 100 mL via INTRAVENOUS

## 2023-07-03 LAB — CK: Total CK: 182 U/L (ref 38–234)

## 2023-07-04 ENCOUNTER — Telehealth: Payer: Self-pay | Admitting: Internal Medicine

## 2023-07-04 NOTE — Telephone Encounter (Signed)
Called patient regarding upcoming September appointments, patient is notified.

## 2023-07-06 NOTE — Progress Notes (Unsigned)
Cardiology Office Note:    Date:  07/07/2023   ID:  Melissa Pineda, DOB 1986-04-27, MRN [id number]  PCP:  Patient, No Pcp Per   [facility] Providers Cardiologist:  None     Referring MD: Sharlene Dory, PA-C   Chief Complaint  Patient presents with   PFO    History of Present Illness:    Melissa Pineda is a 37 y.o. female presenting for evaluation of PFO, referred by Jari Favre, PA-C.  The patient has a complex medical history with non-small cell lung cancer, history of arterial thrombosis in her legs requiring thrombectomy, and probable cardioembolic stroke now fully recovered.  She has been managed with triple therapy using aspirin, clopidogrel, and rivaroxaban.  There was previously a concern for left atrial thrombus from a CT scan in February 2024 as a potential source of her embolic events.  A follow-up CT the next month showed resolution of this finding.  She ultimately underwent a transesophageal echo which showed no left atrial appendage thrombus, normal LV and RV function, and a small PFO. TEE is reviewed and shows normal LV and RV function, no intracardiac masses, and a small PFO based on weakly positive agitated saline study. I don't appreciate any color-flow across the interatrial septum. There is a prominent chiari network in the RA.   The patient is here alone today.  She is actually doing pretty well now.  She states that her lung cancer is responding to targeted therapy.  She complains of easy bruising on all of the blood thinning medicine.  She otherwise has no specific complaints and denies chest pain, chest pressure, or shortness of breath with any of her normal activities.  Past Medical History:  Diagnosis Date   Anxiety    Phreesia 11/21/2020   Cancer [facility])     Past Surgical History:  Procedure Laterality Date   ABDOMINAL AORTOGRAM W/LOWER EXTREMITY N/A 11/27/2022   Procedure: ABDOMINAL AORTOGRAM W/LOWER EXTREMITY;  Surgeon: Leonie Douglas, MD;  Location:  [facility] INVASIVE CV LAB;  Service: Cardiovascular;  Laterality: N/A;   IR PERC PLEURAL DRAIN W/INDWELL CATH W/IMG GUIDE  11/09/2022   PERIPHERAL VASCULAR ATHERECTOMY  11/27/2022   Procedure: PERIPHERAL VASCULAR ATHERECTOMY;  Surgeon: Leonie Douglas, MD;  Location: [facility] INVASIVE CV LAB;  Service: Cardiovascular;;   PERIPHERAL VASCULAR THROMBECTOMY  11/27/2022   Procedure: PERIPHERAL VASCULAR THROMBECTOMY;  Surgeon: Leonie Douglas, MD;  Location: [facility] INVASIVE CV LAB;  Service: Cardiovascular;;   REMOVAL OF PLEURAL DRAINAGE CATHETER N/A 12/17/2022   Procedure: MINOR REMOVAL OF PLEURAL DRAINAGE CATHETER;  Surgeon: Josephine Igo, DO;  Location: [facility] ENDOSCOPY;  Service: Cardiopulmonary;  Laterality: N/A;   TEE WITHOUT CARDIOVERSION N/A 06/10/2023   Procedure: TRANSESOPHAGEAL ECHOCARDIOGRAM;  Surgeon: Chilton Si, MD;  [facility] INVAS[facility] CV LAB;  Service: Cardiovascular;  Laterality: N/A;   WISDOM TOOTH EXTRACTION      Current Medications: Current Meds  Medication Sig   acetaminophen (TYLENOL) 325 MG tablet Take 650 mg by mouth every 6 (six) hours as needed for mild pain.   ALPRAZolam (XANAX) 0.5 MG tablet Take 1 tablet (0.5 mg total) by mouth 3 (three) times daily as needed for anxiety.   ascorbic acid (VITAMIN C) 500 MG tablet Take 500 mg by mouth daily.   aspirin EC 81 MG tablet Take 1 tablet (81 mg total) by mouth daily. Swallow whole.   b complex vitamins capsule Take 1 capsule by mouth daily.   Cholecalciferol (VITAMIN D-3 PO) Take 2,000  Units by mouth daily.   clopidogrel (PLAVIX) 75 MG tablet Take 1 tablet (75 mg total) by mouth daily.   Ferrous Sulfate (IRON HIGH-POTENCY) 142 (45 Fe) MG TBCR Take 142 mg by mouth daily.   levonorgestrel (MIRENA, 52 MG,) 20 MCG/DAY IUD 1 each by Intrauterine route once.   metoCLOPramide (REGLAN) 5 MG tablet Take 1 tablet (5 mg total) by mouth every 6 (six) hours as needed for nausea.   ondansetron (ZOFRAN) 8 MG tablet Take 1 tablet (8 mg total) by mouth  every 8 (eight) hours as needed for nausea or vomiting.   ondansetron (ZOFRAN-ODT) 8 MG disintegrating tablet Take 1 tablet (8 mg total) by mouth every 8 (eight) hours as needed for nausea or vomiting.   Ondansetron 4 MG FILM Take 4-8 mg by mouth every 6 (six) hours as needed.   pantoprazole (PROTONIX) 40 MG tablet Take 1 tablet (40 mg total) by mouth daily.   polyethylene glycol (MIRALAX / GLYCOLAX) 17 g packet Take 17 g by mouth daily as needed for moderate constipation.   predniSONE (DELTASONE) 50 MG tablet Take 1 tablet (50 mg total) by mouth as directed. Take one tablet 13 hours , 1 tablet 7 hours and 1 tablet 1 hour prior to CT scan with IV contrast.  Take Benadryl 50 mg po 1 hour before CT scan   Repotrectinib 40 MG CAPS Take 160 mg by mouth daily. (Patient taking differently: Take 160 mg by mouth 2 (two) times daily.)   rivaroxaban (XARELTO) 20 MG TABS tablet Take 1 tablet (20 mg total) by mouth daily with supper.   Zinc 22.5 MG TABS Take 22.5 mg by mouth daily.     Allergies:   Hydrocodone, Tramadol, and Iodinated contrast media   Social History   Socioeconomic History   Marital status: Married    Spouse name: Not on file   Number of children: Not on file   Years of education: Not on file   Highest education level: Not on file  Occupational History   Not on file  Tobacco Use   Smoking status: Never   Smokeless tobacco: Never  Vaping Use   Vaping status: Never Used  Substance and Sexual Activity   Alcohol use: Yes    Alcohol/week: 7.0 standard drinks of alcohol    Types: 7 Glasses of wine per week    Comment: 1 glass wine in the evening    Drug use: Yes    Types: Marijuana    Comment: occasional   Sexual activity: Yes    Birth control/protection: I.U.D.  Other Topics Concern   Not on file  Social History Narrative   Not on file   Social Determinants of Health   Financial Resource Strain: Not on file  Food Insecurity: No Food Insecurity (11/26/2022)   Hunger  Vital Sign    Worried About Running Out of Food in the Last Year: Never true    Ran Out of Food in the Last Year: Never true  Transportation Needs: No Transportation Needs (11/26/2022)   PRAPARE - Administrator, Civil Service (Medical): No    Lack of Transportation (Non-Medical): No  Physical Activity: Not on file  Stress: Not on file  Social Connections: Not on file     Family History: The patient's family history includes Cancer in her maternal grandfather, maternal grandmother, mother, and paternal grandfather; Hypertension in her paternal grandmother.  ROS:   Please see the history of present illness.    All  other systems reviewed and are negative.  EKGs/Labs/Other Studies Reviewed:    The following studies were reviewed today: Echo TEE: 1. Left ventricular ejection fraction, by estimation, is 60 to 65%. The  left ventricle has normal function. The left ventricle has no regional  wall motion abnormalities.   2. Right ventricular systolic function is normal. The right ventricular  size is normal.   3. No left atrial/left atrial appendage thrombus was detected.   4. The mitral valve is normal in structure. No evidence of mitral valve  regurgitation. No evidence of mitral stenosis.   5. The aortic valve is tricuspid. Aortic valve regurgitation is not  visualized. No aortic stenosis is present.   6. The inferior vena cava is normal in size with greater than 50%  respiratory variability, suggesting right atrial pressure of 3 mmHg.   7. Agitated saline contrast bubble study was positive with shunting  observed within 3-6 cardiac cycles suggestive of interatrial shunt. 3D  images of the PFO were acquired. There is a small patent foramen ovale  with predominantly right to left shunting  across the atrial septum.   Conclusion(s)/Recommendation(s): Normal biventricular function without  evidence of hemodynamically significant valvular heart disease.  EKG  Interpretation Date/Time:  Monday July 07 2023 08:10:19 EDT Ventricular Rate:  89 PR Interval:  144 QRS Duration:  94 QT Interval:  370 QTC Calculation: 450 R Axis:   91  Text Interpretation: Normal sinus rhythm Rightward axis When compared with ECG of 04-Nov-2022 11:45, PREVIOUS ECG IS PRESENTthe heart rate has slowed by 40 bpm, otherwise no significant change noted Confirmed by Tonny Bollman [phone]) on 07/07/2023 11:29:14 AM    Recent Labs: 11/04/2022: B Natriuretic Peptide 14.3 04/10/2023: TSH 2.242 05/13/2023: Magnesium 2.1 07/02/2023: ALT 21; BUN 18; Creatinine 0.85; Hemoglobin 11.2; Platelet Count 590; Potassium 4.3; Sodium 141  Recent Lipid Panel    Component Value Date/Time   CHOL 166 11/28/2022 0108   CHOL 171 11/24/2020 1106   TRIG 131 11/28/2022 0108   HDL 36 (L) 11/28/2022 0108   HDL 73 11/24/2020 1106   CHOLHDL 4.6 11/28/2022 0108   VLDL 26 11/28/2022 0108   LDLCALC 104 (H) 11/28/2022 0108   LDLCALC 89 11/24/2020 1106     Risk Assessment/Calculations:                Physical Exam:    VS:  BP 118/70   Pulse 89   Ht 5\' 6"  (1.676 m)   Wt 165 lb 6.4 oz (75 kg)   SpO2 98%   BMI 26.70 kg/m     Wt Readings from Last 3 Encounters:  07/07/23 165 lb 6.4 oz (75 kg)  06/10/23 160 lb (72.6 kg)  05/19/23 162 lb (73.5 kg)     GEN:  Well nourished, well developed in no acute distress HEENT: Normal NECK: No JVD; No carotid bruits LYMPHATICS: No lymphadenopathy CARDIAC: RRR, no murmurs, rubs, gallops RESPIRATORY:  Clear to auscultation without rales, wheezing or rhonchi  ABDOMEN: Soft, non-tender, non-distended MUSCULOSKELETAL:  No edema; No deformity  SKIN: Warm and dry NEUROLOGIC:  Alert and oriented x 3 PSYCHIATRIC:  Normal affect   ASSESSMENT:    1. PFO (patent foramen ovale)    PLAN:    In order of problems listed above:  37 year old woman with stage IV non-small cell lung cancer complicated by lower extremity ischemia suspected to be  secondary to cardioembolism.  She also had a stroke identified in March 2024.  She now appears to have  stable symptoms of claudication and no further cardiac symptoms.  I personally reviewed her TEE images as outlined above.  She does not have any intracardiac thrombus or mass.  LV and RV function are normal.  There are no significant heart valve lesions.  She has what appears to be a very small PFO.  I think she should continue with anticoagulation and medical therapy and I do not think she would benefit from transcatheter PFO closure.  There is no atrial septal aneurysm, nor is there a moderate or large defect to target.  I cannot appreciate any color flow across the interatrial septum and there are only a few bubbles that are seen in the left heart after agitated saline injection.  I reviewed the data that associates cryptogenic stroke/paradoxical embolism with PFO today.  We discussed potential treatment strategies.  From the perspective of venous thrombosis and paradoxical embolus, I feel like she would be well protected by remaining on rivaroxaban for oral anticoagulation.  I wonder if she can de-escalate her antiplatelet therapy and stop clopidogrel and aspirin.  I will reach out to her other providers so that we can put our heads together regarding that.  I would be happy to see the patient back if any other problems arise.  Otherwise I will just see her on an as-needed basis.           Medication Adjustments/Labs and Tests Ordered: Current medicines are reviewed at length with the patient today.  Concerns regarding medicines are outlined above.  Orders Placed This Encounter  Procedures   EKG 12-Lead   No orders of the defined types were placed in this encounter.   Patient Instructions  Medication Instructions:  Your physician recommends that you continue on your current medications as directed. Please refer to the Current Medication list given to you today.  *If you need a refill on your  cardiac medications before your next appointment, please call your pharmacy*   Lab Work: NONE If you have labs (blood work) drawn today and your tests are completely normal, you will receive your results only by: MyChart Message (if you have MyChart) OR A paper copy in the mail If you have any lab test that is abnormal or we need to change your treatment, we will call you to review the results.   Testing/Procedures: NONE   Follow-Up: At [facility], you and your health needs are our priority.  As part of our continuing mission to provide you with exceptional heart care, we have created designated Provider Care Teams.  These Care Teams include your primary Cardiologist (physician) and Advanced Practice Providers (APPs -  Physician Assistants and Nurse Practitioners) who all work together to provide you with the care you need, when you need it.  We recommend signing up for the patient portal called "MyChart".  Sign up information is provided on this After Visit Summary.  MyChart is used to connect with patients for Virtual Visits (Telemedicine).  Patients are able to view lab/test results, encounter notes, upcoming appointments, etc.  Non-urgent messages can be sent to your provider as well.   To learn more about what you can do with MyChart, go to [email or web address].    Your next appointment:   As Needed  Provider:   Tonny Bollman, MD     Signed, Tonny Bollman, MD  07/07/2023 11:34 AM    [facility]

## 2023-07-07 ENCOUNTER — Ambulatory Visit: Payer: BC Managed Care – PPO | Attending: Cardiovascular Disease | Admitting: Cardiovascular Disease

## 2023-07-07 ENCOUNTER — Encounter: Payer: Self-pay | Admitting: Cardiovascular Disease

## 2023-07-07 VITALS — BP 118/70 | HR 89 | Ht 66.0 in | Wt 165.4 lb

## 2023-07-07 DIAGNOSIS — Q2112 Patent foramen ovale: Secondary | ICD-10-CM

## 2023-07-07 NOTE — Patient Instructions (Signed)
Medication Instructions:  Your physician recommends that you continue on your current medications as directed. Please refer to the Current Medication list given to you today.  *If you need a refill on your cardiac medications before your next appointment, please call your pharmacy*   Lab Work: NONE If you have labs (blood work) drawn today and your tests are completely normal, you will receive your results only by: MyChart Message (if you have MyChart) OR A paper copy in the mail If you have any lab test that is abnormal or we need to change your treatment, we will call you to review the results.   Testing/Procedures: NONE   Follow-Up: At [facility], you and your health needs are our priority.  As part of our continuing mission to provide you with exceptional heart care, we have created designated Provider Care Teams.  These Care Teams include your primary Cardiologist (physician) and Advanced Practice Providers (APPs -  Physician Assistants and Nurse Practitioners) who all work together to provide you with the care you need, when you need it.  We recommend signing up for the patient portal called "MyChart".  Sign up information is provided on this After Visit Summary.  MyChart is used to connect with patients for Virtual Visits (Telemedicine).  Patients are able to view lab/test results, encounter notes, upcoming appointments, etc.  Non-urgent messages can be sent to your provider as well.   To learn more about what you can do with MyChart, go to [email or web address].    Your next appointment:   As Needed  Provider:   Tonny Bollman, MD

## 2023-07-08 ENCOUNTER — Telehealth: Payer: Self-pay | Admitting: Internal Medicine

## 2023-07-08 NOTE — Telephone Encounter (Signed)
Called patient regarding 09/18 appointments, patient is notified.

## 2023-07-09 ENCOUNTER — Ambulatory Visit: Payer: BC Managed Care – PPO | Admitting: Internal Medicine

## 2023-07-09 ENCOUNTER — Inpatient Hospital Stay: Payer: BC Managed Care – PPO | Admitting: Internal Medicine

## 2023-07-09 ENCOUNTER — Inpatient Hospital Stay (HOSPITAL_BASED_OUTPATIENT_CLINIC_OR_DEPARTMENT_OTHER): Payer: BC Managed Care – PPO | Admitting: Internal Medicine

## 2023-07-09 VITALS — BP 129/77 | HR 78 | Temp 98.2°F | Resp 17 | Ht 66.0 in | Wt 166.2 lb

## 2023-07-09 DIAGNOSIS — C7951 Secondary malignant neoplasm of bone: Secondary | ICD-10-CM | POA: Diagnosis not present

## 2023-07-09 DIAGNOSIS — Z7982 Long term (current) use of aspirin: Secondary | ICD-10-CM | POA: Diagnosis not present

## 2023-07-09 DIAGNOSIS — Z7902 Long term (current) use of antithrombotics/antiplatelets: Secondary | ICD-10-CM | POA: Diagnosis not present

## 2023-07-09 DIAGNOSIS — Z793 Long term (current) use of hormonal contraceptives: Secondary | ICD-10-CM | POA: Diagnosis not present

## 2023-07-09 DIAGNOSIS — C3492 Malignant neoplasm of unspecified part of left bronchus or lung: Secondary | ICD-10-CM

## 2023-07-09 DIAGNOSIS — Z8774 Personal history of (corrected) congenital malformations of heart and circulatory system: Secondary | ICD-10-CM | POA: Diagnosis not present

## 2023-07-09 DIAGNOSIS — Z79899 Other long term (current) drug therapy: Secondary | ICD-10-CM | POA: Diagnosis not present

## 2023-07-09 DIAGNOSIS — Z7901 Long term (current) use of anticoagulants: Secondary | ICD-10-CM | POA: Diagnosis not present

## 2023-07-09 DIAGNOSIS — D649 Anemia, unspecified: Secondary | ICD-10-CM | POA: Diagnosis not present

## 2023-07-09 NOTE — Progress Notes (Signed)
[facility] [phone]   [phone]  OFFICE PROGRESS NOTE  Patient, No Pcp Per No address on file  DIAGNOSIS: Stage IVB (T4, N3, M1b) non-small cell lung cancer, adenocarcinoma presented with large left diaphragmatic surface mass in addition to left hilar, infrahilar, AP window, right and left paratracheal, subcarinal as well as left internal mammary lymphadenopathy in addition to right gastric lymph node and solitary Small right frontal calvarial osseous metastatic disease with large malignant left pleural effusion diagnosed in January 2024.    Molecular studies by [employer] as well as [employer] blood test showed:   EZR-ROS1 fusion approved by FDA Crizotinib, Entrectinib, Repotrectinib   approved in other indication Ceritinib, Lorlatinib Yes      3.6%   PD-L1 expression by [employer] that was 98%.   PRIOR THERAPY: Status post left Pleurx catheter placement for drainage of recurrent left pleural effusion.  This was removed on December 17, 2022.  CURRENT THERAPY: Repotrectinib (Augtyro) 160 mg p.o. daily for the first 2 weeks and then 160 mg p.o. twice daily, first dose November 27, 2022.  She is status post 7 months of treatment  INTERVAL HISTORY: Melissa Pineda 37 y.o. female Discussed the use of AI scribe software for clinical note transcription with the patient, who gave verbal consent to proceed.  History of Present Illness   The patient, diagnosed with Stage 4 lung cancer, has been on ribotrectinib for approximately seven months. They report no new complaints or significant changes in their health status over the past two months. Notably, they have experienced hair regrowth without the use of any specific products. They deny any new onset of chest pain, breathing difficulties, cough, hemoptysis, nausea, vomiting, diarrhea, or constipation.  The patient does report leg pain, which they attribute to lack of physical activity rather than  a symptom of deep vein thrombosis.  They have been diagnosed with a small patent foramen ovale (PFO) and have a history of clotting. They also have anemia, for which they are taking iron supplements.  The patient has undergone a recent scan, the results of which are pending. She expresses understanding and acceptance of potential future courses of action depending on the scan results.        MEDICAL HISTORY: Past Medical History:  Diagnosis Date   Anxiety    [employer] 11/21/2020   Cancer (HCC)     ALLERGIES:  is allergic to hydrocodone, tramadol, and iodinated contrast media.  MEDICATIONS:  Current Outpatient Medications  Medication Sig Dispense Refill   acetaminophen (TYLENOL) 325 MG tablet Take 650 mg by mouth every 6 (six) hours as needed for mild pain.     ALPRAZolam (XANAX) 0.5 MG tablet Take 1 tablet (0.5 mg total) by mouth 3 (three) times daily as needed for anxiety. 60 tablet 0   ascorbic acid (VITAMIN C) 500 MG tablet Take 500 mg by mouth daily.     aspirin EC 81 MG tablet Take 1 tablet (81 mg total) by mouth daily. Swallow whole. 30 tablet 12   b complex vitamins capsule Take 1 capsule by mouth daily.     Cholecalciferol (VITAMIN D-3 PO) Take 2,000 Units by mouth daily.     clopidogrel (PLAVIX) 75 MG tablet Take 1 tablet (75 mg total) by mouth daily. 30 tablet 11   Ferrous Sulfate (IRON HIGH-POTENCY) 142 (45 Fe) MG TBCR Take 142 mg by mouth daily.     levonorgestrel (MIRENA, 52 MG,) 20 MCG/DAY IUD 1 each  by Intrauterine route once.     metoCLOPramide (REGLAN) 5 MG tablet Take 1 tablet (5 mg total) by mouth every 6 (six) hours as needed for nausea. 30 tablet 1   ondansetron (ZOFRAN) 8 MG tablet Take 1 tablet (8 mg total) by mouth every 8 (eight) hours as needed for nausea or vomiting. 20 tablet 0   ondansetron (ZOFRAN-ODT) 8 MG disintegrating tablet Take 1 tablet (8 mg total) by mouth every 8 (eight) hours as needed for nausea or vomiting. 20 tablet 1   Ondansetron 4 MG FILM  Take 4-8 mg by mouth every 6 (six) hours as needed. 15 each 0   pantoprazole (PROTONIX) 40 MG tablet Take 1 tablet (40 mg total) by mouth daily. 30 tablet 3   polyethylene glycol (MIRALAX / GLYCOLAX) 17 g packet Take 17 g by mouth daily as needed for moderate constipation.     predniSONE (DELTASONE) 50 MG tablet Take 1 tablet (50 mg total) by mouth as directed. Take one tablet 13 hours , 1 tablet 7 hours and 1 tablet 1 hour prior to CT scan with IV contrast.  Take Benadryl 50 mg po 1 hour before CT scan 3 tablet 5   Repotrectinib 40 MG CAPS Take 160 mg by mouth daily. (Patient taking differently: Take 160 mg by mouth 2 (two) times daily.) 56 capsule 0   rivaroxaban (XARELTO) 20 MG TABS tablet Take 1 tablet (20 mg total) by mouth daily with supper. 90 tablet 1   Zinc 22.5 MG TABS Take 22.5 mg by mouth daily.     No current facility-administered medications for this visit.    SURGICAL HISTORY:  Past Surgical History:  Procedure Laterality Date   ABDOMINAL AORTOGRAM W/LOWER EXTREMITY N/A 11/27/2022   Procedure: ABDOMINAL AORTOGRAM W/LOWER EXTREMITY;  Surgeon: Leonie Douglas, MD;  Location: [facility] INVASIVE CV LAB;  Service: Cardiovascular;  Laterality: N/A;   IR PERC PLEURAL DRAIN W/INDWELL CATH W/IMG GUIDE  11/09/2022   PERIPHERAL VASCULAR ATHERECTOMY  11/27/2022   Procedure: PERIPHERAL VASCULAR ATHERECTOMY;  Surgeon: Leonie Douglas, MD;  Location: [facility] INVASIVE CV LAB;  Service: Cardiovascular;;   PERIPHERAL VASCULAR THROMBECTOMY  11/27/2022   Procedure: PERIPHERAL VASCULAR THROMBECTOMY;  Surgeon: Leonie Douglas, MD;  Location: [facility] INVASIVE CV LAB;  Service: Cardiovascular;;   REMOVAL OF PLEURAL DRAINAGE CATHETER N/A 12/17/2022   Procedure: MINOR REMOVAL OF PLEURAL DRAINAGE CATHETER;  Surgeon: Josephine Igo, DO;  Location: [facility] ENDOSCOPY;  Service: Cardiopulmonary;  Laterality: N/A;   TEE WITHOUT CARDIOVERSION N/A 06/10/2023   Procedure: TRANSESOPHAGEAL ECHOCARDIOGRAM;  Surgeon: Chilton Si, MD;   Location: [facility] INVASIVE CV LAB;  Service: Cardiovascular;  Laterality: N/A;   WISDOM TOOTH EXTRACTION      REVIEW OF SYSTEMS:  Constitutional: negative Eyes: negative Ears, nose, mouth, throat, and face: negative Respiratory: negative Cardiovascular: negative Gastrointestinal: negative Genitourinary:negative Integument/breast: negative Hematologic/lymphatic: negative Musculoskeletal:negative Neurological: positive for paresthesia Behavioral/Psych: negative Endocrine: negative Allergic/Immunologic: negative   PHYSICAL EXAMINATION: General appearance: alert, cooperative, and no distress Head: Normocephalic, without obvious abnormality, atraumatic Neck: no adenopathy, no JVD, supple, symmetrical, trachea midline, and thyroid not enlarged, symmetric, no tenderness/mass/nodules Lymph nodes: Cervical, supraclavicular, and axillary nodes normal. Resp: clear to auscultation bilaterally Back: symmetric, no curvature. ROM normal. No CVA tenderness. Cardio: regular rate and rhythm, S1, S2 normal, no murmur, click, rub or gallop GI: soft, non-tender; bowel sounds normal; no masses,  no organomegaly Extremities: extremities normal, atraumatic, no cyanosis or edema Neurologic: Alert and oriented X 3, normal strength and tone.  Normal symmetric reflexes. Normal coordination and gait  ECOG PERFORMANCE STATUS: 0 - Asymptomatic  Blood pressure 129/77, pulse 78, temperature 98.2 F (36.8 C), temperature source Oral, resp. rate 17, height 5\' 6"  (1.676 m), weight 166 lb 3.2 oz (75.4 kg), SpO2 100%, not currently breastfeeding.  LABORATORY DATA: Lab Results  Component Value Date   WBC 6.8 07/02/2023   HGB 11.2 (L) 07/02/2023   HCT 33.4 (L) 07/02/2023   MCV 92.5 07/02/2023   PLT 590 (H) 07/02/2023      Chemistry      Component Value Date/Time   NA 141 07/02/2023 1519   NA 142 05/26/2023 0938   K 4.3 07/02/2023 1519   CL 106 07/02/2023 1519   CO2 29 07/02/2023 1519   BUN 18 07/02/2023 1519    BUN 17 05/26/2023 0938   CREATININE 0.85 07/02/2023 1519      Component Value Date/Time   CALCIUM 9.9 07/02/2023 1519   ALKPHOS 61 07/02/2023 1519   AST 22 07/02/2023 1519   ALT 21 07/02/2023 1519   BILITOT 0.4 07/02/2023 1519       RADIOGRAPHIC STUDIES: ECHO TEE  Result Date: 06/10/2023    TRANSESOPHOGEAL ECHO REPORT   Patient Name:   Melissa Pineda Date of Exam: 06/10/2023 Medical Rec #:  [id number]    Height:       66.0 in Accession #:    [id number]   Weight:       162.0 lb Date of Birth:  Jun 13, 1986     BSA:          1.828 m Patient Age:    37 years     BP:           130/88 mmHg Patient Gender: F            HR:           93 bpm. Exam Location:  Inpatient Procedure: Transesophageal Echo, Cardiac Doppler and Color Doppler Indications:     Stroke  History:         Patient has prior history of Echocardiogram examinations, most                  recent 11/27/2022. Stroke. Lung cancer.  Sonographer:     Sheralyn Boatman RDCS Referring Phys:  [id number] [facility] Diagnosing Phys: Chilton Si MD PROCEDURE: After discussion of the risks and benefits of a TEE, an informed consent was obtained from the patient. The transesophogeal probe was passed without difficulty through the esophogus of the patient. Imaged were obtained with the patient in a left lateral decubitus position. Sedation performed by different physician. The patient was monitored while under deep sedation. Anesthestetic sedation was provided intravenously by Anesthesiology: 285mg  of Propofol, 20mg  of Lidocaine. The patient's vital signs; including heart rate, blood pressure, and oxygen saturation; remained stable throughout the procedure. The patient developed no complications during the procedure.  IMPRESSIONS  1. Left ventricular ejection fraction, by estimation, is 60 to 65%. The left ventricle has normal function. The left ventricle has no regional wall motion abnormalities.  2. Right ventricular systolic function is normal. The right  ventricular size is normal.  3. No left atrial/left atrial appendage thrombus was detected.  4. The mitral valve is normal in structure. No evidence of mitral valve regurgitation. No evidence of mitral stenosis.  5. The aortic valve is tricuspid. Aortic valve regurgitation is not visualized. No aortic stenosis is present.  6. The inferior vena cava is normal in size with  greater than 50% respiratory variability, suggesting right atrial pressure of 3 mmHg.  7. Agitated saline contrast bubble study was positive with shunting observed within 3-6 cardiac cycles suggestive of interatrial shunt. 3D images of the PFO were acquired. There is a small patent foramen ovale with predominantly right to left shunting across the atrial septum. Conclusion(s)/Recommendation(s): Normal biventricular function without evidence of hemodynamically significant valvular heart disease. FINDINGS  Left Ventricle: Left ventricular ejection fraction, by estimation, is 60 to 65%. The left ventricle has normal function. The left ventricle has no regional wall motion abnormalities. The left ventricular internal cavity size was normal in size. There is  no left ventricular hypertrophy. Right Ventricle: The right ventricular size is normal. No increase in right ventricular wall thickness. Right ventricular systolic function is normal. Left Atrium: Left atrial size was normal in size. No left atrial/left atrial appendage thrombus was detected. Right Atrium: Right atrial size was normal in size. Prominent Chiari network. Pericardium: There is no evidence of pericardial effusion. Mitral Valve: The mitral valve is normal in structure. No evidence of mitral valve regurgitation. No evidence of mitral valve stenosis. Tricuspid Valve: The tricuspid valve is normal in structure. Tricuspid valve regurgitation is trivial. No evidence of tricuspid stenosis. Aortic Valve: The aortic valve is tricuspid. Aortic valve regurgitation is not visualized. No aortic  stenosis is present. Pulmonic Valve: The pulmonic valve was normal in structure. Pulmonic valve regurgitation is trivial. No evidence of pulmonic stenosis. Aorta: The aortic root and ascending aorta are structurally normal, with no evidence of dilitation. Venous: The inferior vena cava is normal in size with greater than 50% respiratory variability, suggesting right atrial pressure of 3 mmHg. IAS/Shunts: No atrial level shunt detected by color flow Doppler. Agitated saline contrast was given intravenously to evaluate for intracardiac shunting. Agitated saline contrast bubble study was positive with shunting observed within 3-6 cardiac cycles suggestive of interatrial shunt. A small patent foramen ovale is detected with predominantly right to left shunting across the atrial septum. 3D images of the PFO were acquired. Additional Comments: Spectral Doppler performed. Chilton Si MD Electronically signed by Chilton Si MD Signature Date/Time: 06/10/2023/12:02:25 PM    Final    EP STUDY  Result Date: 06/10/2023 See surgical note for result.   ASSESSMENT AND PLAN: This is a very pleasant 37 years old white female with  Stage IVB (T4, N3, M1b) non-small cell lung cancer, adenocarcinoma presented with large left diaphragmatic surface mass in addition to left hilar, infrahilar, AP window, right and left paratracheal, subcarinal as well as left internal mammary lymphadenopathy in addition to right gastric lymph node and solitary small right frontal calvarial osseous metastatic disease with large malignant left pleural effusion diagnosed in January 2024.   Fortunately her molecular studies showed positive ROS1 fusion.  The patient also had PD-L1 expression of 98%. The patient is currently undergoing treatment was started therapy with repotrectinib started November 27, 2022.  Status post 7 months of treatment and has been tolerating it fairly well. Stage 4 Lung Cancer Stable on Ribotrectinib with no new  symptoms or side effects. Recent imaging pending radiology report, but initial review by oncologist did not reveal any concerning findings. -Continue Ribotrectinib. -Repeat blood work in 6 weeks. -Plan for next imaging in 3 months, unless concerning findings on current scan.  Anemia Mild improvement on iron supplementation. -Continue iron tablets.  Patent Foramen Ovale (PFO) and History of Clotting Small PFO identified on recent TEE. Plan to discontinue Plavix and aspirin, continue Xarelto. -Discontinue  Plavix and aspirin. -Continue Xarelto. -Follow up with Dr. Lenell Antu.  General Health Maintenance -Plan to initiate exercise regimen with personal trainer experienced in working with similar patients. -Follow up in 2 months for routine visit and labs.     The patient voices understanding of current disease status and treatment options and is in agreement with the current care plan.  All questions were answered. The patient knows to call the clinic with any problems, questions or concerns. We can certainly see the patient much sooner if necessary.  The total time spent in the appointment was 30 minutes.  Disclaimer: This note was dictated with voice recognition software. Similar sounding words can inadvertently be transcribed and may not be corrected upon review.

## 2023-07-10 ENCOUNTER — Other Ambulatory Visit: Payer: Self-pay | Admitting: Internal Medicine

## 2023-07-10 DIAGNOSIS — C3490 Malignant neoplasm of unspecified part of unspecified bronchus or lung: Secondary | ICD-10-CM

## 2023-07-11 ENCOUNTER — Ambulatory Visit (HOSPITAL_COMMUNITY)
Admission: RE | Admit: 2023-07-11 | Discharge: 2023-07-11 | Disposition: A | Discharge status: Home / Self Care | Payer: BC Managed Care – PPO | Source: Ambulatory Visit | Attending: Vascular Surgery

## 2023-07-11 ENCOUNTER — Other Ambulatory Visit: Payer: Self-pay | Admitting: Physician Assistant

## 2023-07-11 ENCOUNTER — Other Ambulatory Visit: Payer: Self-pay

## 2023-07-11 ENCOUNTER — Ambulatory Visit: Payer: BC Managed Care – PPO | Admitting: Physician Assistant

## 2023-07-11 DIAGNOSIS — I739 Peripheral vascular disease, unspecified: Secondary | ICD-10-CM | POA: Diagnosis not present

## 2023-07-11 DIAGNOSIS — C3492 Malignant neoplasm of unspecified part of left bronchus or lung: Secondary | ICD-10-CM

## 2023-07-11 LAB — VAS US ABI WITH/WO TBI
Left ABI: 0.76
Right ABI: 0.8

## 2023-07-11 MED ORDER — REPOTRECTINIB 40 MG PO CAPS: 160.0000 mg | ORAL_CAPSULE | Freq: Two times a day (BID) | ORAL | 2 refills | Status: DC

## 2023-07-14 NOTE — Progress Notes (Unsigned)
[facility] [place]  ASSESSMENT / PLAN: 37 y.o. female with subacute bilateral lower extremity ischemia causing rest pain in the left lower extremity and claudication in the right lower extremity. Likely from cardioembolism or tumor embolism to lower extremities. This was managed with successful endovascular thrombectomy 11/27/22 with resolution of rest pain.  Her claudication type symptoms have returned. She is also describing paresthesias in bilateral lower extremities and left upper extremity. She had a silent stroke identified in March 2024. Repeat MRI showed no new stroke.   Overall, I am concerned about a persistent embolic source in her heart. CT angiogram identified a tumor / thrombus in her left atrium in February 2024. We have not been able to see this since with transthoracic echocardiogram. I have requested a transesophageal echocardiogram. I reached out to Dr. Flora Lipps to ask about other imaging options we may have to better study her heart as an embolic source. I am also going to perform an arterial ultrasound of both legs to evaluate for new occlusions.   Thankfully, her symptoms in the lower extremity are not limb-threatening.  I counseled her I cannot explain her paresthesias from a circulation standpoint, especially because involve the left upper extremity as well.  I encouraged her to exercise as much as she can tolerate.  Pending the above results, we will keep her on "triple therapy" indefinitely (aspirin, Plavix, Xarelto).  She may need to transition from Xarelto to Coumadin if new thrombus is identified.  CHIEF COMPLAINT: Severe leg pain  HISTORY OF PRESENT ILLNESS: Melissa Pineda is a 37 y.o. female who was urgently added into the clinic today for evaluation of ultrasound findings.  The patient has had an unusual course.  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  She was found to have genetic  abnormalities causing her lung cancer.  She is a never smoker.  She had malignant effusion and required a Pleurx catheter.  She reports worsening leg pain over the past several days.  Prior to this, she describes leg pain like shinsplints.  She reports severe pain about the metatarsals and toes on the left, classic for ischemic rest pain.  She reports cramping discomfort in the right leg, which is bearable to her.  01/06/23: Patient returns to clinic after intervention and beginning immunotherapy.  She is doing much better overall.  She looks much more healthy.  She does describe some cramping discomfort with walking and easy fatigability.  She does not describe rest pain.  Her symptoms are manageable for her.  Since CT scan of the chest shows good response to immunotherapy.  04/08/23: Patient returns to clinic.  She is doing very well.  She has been told she has a complete response to her directed therapy for adenocarcinoma of the lung.  She is walking is much as she likes.  She has noticed easy bruising.  She has not noticed problematic bleeding.  She does not have menorrhagia.  She seems to be tolerating triple therapy fairly well.  She did suffer a punctate stroke in March 2024.  A small speech deficit has since resolved.  She had a repeat MRI scan of the brain today looking for new infarcts.  I reviewed these findings with her.  I agree with her oncologist, continuing maximal anticoagulant and antiplatelet therapy seems reasonable for the foreseeable future.  05/12/23: Patient returns to clinic.  She reports new claudication type symptoms in both legs.  She has cramping discomfort in her proximal calves and behind the knee bilaterally.  She does not have rest pain.  She also describes paresthesias in both legs.  These occur with rest or activity.  She also describes paresthesias in the left upper extremity.  We reviewed her recent course.    VASCULAR SURGICAL HISTORY: none  VASCULAR RISK  FACTORS: Negative history of stroke / transient ischemic attack. Negative history of coronary artery disease.  Negative history of diabetes mellitus.  Negative history of smoking. Negative history of hypertension.  Negative history of chronic kidney disease.  Negative history of chronic obstructive pulmonary disease.  FUNCTIONAL STATUS: ECOG performance status: (0) Fully active, able to carry on all predisease performance without restriction Ambulatory status: Ambulatory within the community without limits  Past Medical History:  Diagnosis Date   Anxiety    Phreesia 11/21/2020   Cancer [facility])     Past Surgical History:  Procedure Laterality Date   ABDOMINAL AORTOGRAM W/LOWER EXTREMITY N/A 11/27/2022   Procedure: ABDOMINAL AORTOGRAM W/LOWER EXTREMITY;  Surgeon: Leonie Douglas, MD;  Location: [facility];  Service: Cardiovascular;  Laterality: N/A;   IR PERC PLEURAL DRAIN W/INDWELL CATH W/IMG GUIDE  11/09/2022   PERIPHERAL VASCULAR ATHERECTOMY  11/27/2022   Procedure: PERIPHERAL VASCULAR ATHERECTOMY;  Surgeon: Leonie Douglas, MD;  Location: [facility] CV [facility];  Service: Cardiovascular;;   PERIPHERAL VASCULAR THROMBECTOMY  11/27/2022   Procedure: PERIPHERAL VASCULAR THROMBECTOMY;  Surgeon: Leonie Douglas, MD;  Location: [facility] CV [facility];  Service: Cardiovascular;;   REMOVAL OF PLEURAL DRAINAGE CATHETER N/A 12/17/2022   Procedure: MINOR REMOVAL OF PLEURAL DRAINAGE CATHETER;  Surgeon: Josephine Igo, DO;  Location: [facility] ENDOS[facility];  Service: Cardiopulmonary;  Laterality: N/A;   TEE WITHOUT CARDIOVERSION N/A 06/10/2023   Procedure: TRANSESOPHAGEAL ECHOCARDIOGRAM;  Surgeon: Chilton Si, MD;  Location: [facility] INVASIVE CV LAB;  Service: Cardiovascular;  Laterality: N/A;   WISDOM TOOTH EXTRACTION      Family History  Problem Relation Age of Onset   Cancer Mother    Cancer Maternal Grandfather    Cancer Maternal Grandmother    Cancer Paternal Grandfather    Hypertension Paternal  Grandmother     Social History   Socioeconomic History   Marital status: Married    Spouse name: Not on file   Number of children: Not on file   Years of education: Not on file   Highest education level: Not on file  Occupational History   Not on file  Tobacco Use   Smoking status: Never   Smokeless tobacco: Never  Vaping Use   Vaping status: Never Used  Substance and Sexual Activity   Alcohol use: Yes    Alcohol/week: 7.0 standard drinks of alcohol    Types: 7 Glasses of wine per week    Comment: 1 glass wine in the evening    Drug use: Yes    Types: Marijuana    Comment: occasional   Sexual activity: Yes    Birth control/protection: I.U.D.  Other Topics Concern   Not on file  Social History Narrative   Not on file   Social Determinants of Health   Financial Resource Strain: Not on file  Food Insecurity: No Food Insecurity (11/26/2022)   Hunger Vital Sign    Worried About Running Out of Food in the Last Year: Never true    Ran Out of Food in the Last Year: Never true  Transportation Needs: No Transportation Needs (11/26/2022)   PRAPARE -  Administrator, Civil Service (Medical): No    Lack of Transportation (Non-Medical): No  Physical Activity: Not on file  Stress: Not on file  Social Connections: Not on file  Intimate Partner Violence: Not At Risk (11/26/2022)   Humiliation, Afraid, Rape, and Kick questionnaire    Fear of Current or Ex-Partner: No    Emotionally Abused: No    Physically Abused: No    Sexually Abused: No    Allergies  Allergen Reactions   Hydrocodone Nausea Only    Dizzy, "crawl out of my skin"   Tramadol Other (See Comments)    "Weird feeling"   Iodinated Contrast Media Hives    Current Outpatient Medications  Medication Sig Dispense Refill   acetaminophen (TYLENOL) 325 MG tablet Take 650 mg by mouth every 6 (six) hours as needed for mild pain.     ALPRAZolam (XANAX) 0.5 MG tablet Take 1 tablet (0.5 mg total) by mouth 3 (three)  times daily as needed for anxiety. 60 tablet 0   ascorbic acid (VITAMIN C) 500 MG tablet Take 500 mg by mouth daily.     aspirin EC 81 MG tablet Take 1 tablet (81 mg total) by mouth daily. Swallow whole. 30 tablet 12   b complex vitamins capsule Take 1 capsule by mouth daily.     Cholecalciferol (VITAMIN D-3 PO) Take 2,000 Units by mouth daily.     clopidogrel (PLAVIX) 75 MG tablet Take 1 tablet (75 mg total) by mouth daily. 30 tablet 11   Ferrous Sulfate (IRON HIGH-POTENCY) 142 (45 Fe) MG TBCR Take 142 mg by mouth daily.     levonorgestrel (MIRENA, 52 MG,) 20 MCG/DAY IUD 1 each by Intrauterine route once.     metoCLOPramide (REGLAN) 5 MG tablet Take 1 tablet (5 mg total) by mouth every 6 (six) hours as needed for nausea. 30 tablet 1   ondansetron (ZOFRAN) 8 MG tablet Take 1 tablet (8 mg total) by mouth every 8 (eight) hours as needed for nausea or vomiting. 20 tablet 0   ondansetron (ZOFRAN-ODT) 8 MG disintegrating tablet Take 1 tablet (8 mg total) by mouth every 8 (eight) hours as needed for nausea or vomiting. 20 tablet 1   Ondansetron 4 MG FILM Take 4-8 mg by mouth every 6 (six) hours as needed. 15 each 0   pantoprazole (PROTONIX) 40 MG tablet Take 1 tablet (40 mg total) by mouth daily. 30 tablet 3   polyethylene glycol (MIRALAX / GLYCOLAX) 17 g packet Take 17 g by mouth daily as needed for moderate constipation.     predniSONE (DELTASONE) 50 MG tablet Take 1 tablet (50 mg total) by mouth as directed. Take one tablet 13 hours , 1 tablet 7 hours and 1 tablet 1 hour prior to CT scan with IV contrast.  Take Benadryl 50 mg po 1 hour before CT scan 3 tablet 5   repotrectinib (AUGTYRO) 40 MG capsule Take 4 capsules (160 mg total) by mouth 2 (two) times daily. 240 capsule 2   rivaroxaban (XARELTO) 20 MG TABS tablet Take 1 tablet (20 mg total) by mouth daily with supper. 90 tablet 1   Zinc 22.5 MG TABS Take 22.5 mg by mouth daily.     No current facility-administered medications for this visit.     PHYSICAL EXAM There were no vitals filed for this visit.  Young woman.  Anxious. Mild tachycardia Regular rate and rhythm Bilateral feet are warm No palpable pedal pulses.    PERTINENT LABORATORY  AND RADIOLOGIC DATA  Most recent CBC    Latest Ref Rng & Units 07/02/2023    3:19 PM 05/13/2023    1:07 PM 04/01/2023    3:59 PM  CBC  WBC 4.0 - 10.5 K/uL 6.8  4.8  7.0   Hemoglobin 12.0 - 15.0 g/dL 21.3  08.6  57.8   Hematocrit 36.0 - 46.0 % 33.4  30.7  33.9   Platelets 150 - 400 K/uL 590  456  536      Most recent CMP    Latest Ref Rng & Units 07/02/2023    3:19 PM 05/26/2023    9:38 AM 05/13/2023    1:07 PM  CMP  Glucose 70 - 99 mg/dL 469  76  96   BUN 6 - 20 mg/dL 18  17  11    Creatinine 0.44 - 1.00 mg/dL 6.29  5.28  4.13   Sodium 135 - 145 mmol/L 141  142  142   Potassium 3.5 - 5.1 mmol/L 4.3  4.0  3.8   Chloride 98 - 111 mmol/L 106  106  108   CO2 22 - 32 mmol/L 29  24  27    Calcium 8.9 - 10.3 mg/dL 9.9  8.9  9.5   Total Protein 6.5 - 8.1 g/dL 8.1   7.2   Total Bilirubin 0.3 - 1.2 mg/dL 0.4   0.4   Alkaline Phos 38 - 126 U/L 61   54   AST 15 - 41 U/L 22   20   ALT 0 - 44 U/L 21   18     Renal function Estimated Creatinine Clearance: 94.9 mL/min (by C-G formula based on SCr of 0.85 mg/dL).  Hgb A1c MFr Bld (%)  Date Value  11/24/2020 5.0    LDL Chol Calc (NIH)  Date Value Ref Range Status  11/24/2020 89 0 - 99 mg/dL Final   LDL Cholesterol  Date Value Ref Range Status  11/28/2022 104 (H) 0 - 99 mg/dL Final    Comment:           Total Cholesterol/HDL:CHD Risk Coronary Heart Disease Risk Table                     Men   Women  1/2 Average Risk   3.4   3.3  Average Risk       5.0   4.4  2 X Average Risk   9.6   7.1  3 X Average Risk  23.4   11.0        Use the calculated Patient Ratio above and the CHD Risk Table to determine the patient's CHD Risk.        ATP III CLASSIFICATION (LDL):  <100     mg/dL   Optimal  244-010  mg/dL   Near or Above                     Optimal  130-159  mg/dL   Borderline  272-536  mg/dL   High  >644     mg/dL   Very High Performed at [facility], [address], [place], [place] [address]      Vascular Imaging:  +-------+-----------+-----------+------------+------------+  ABI/TBIToday's ABIToday's TBIPrevious ABIPrevious TBI  +-------+-----------+-----------+------------+------------+  Right 0.81       0.58       1.04        0.72          +-------+-----------+-----------+------------+------------+  Left  0.91       0.62       1.03        0.72          +-------+-----------+-----------+------------+------------+   Melissa Brunt. Lenell Antu, MD FACS [facility] Phone Number: [phone] 07/14/2023 9:15 PM   Total time spent on preparing this encounter including chart review, data review, collecting history, examining the patient, coordinating care for this patient, 30 minutes.  Portions of this report may have been transcribed using voice recognition software.  Every effort has been made to ensure accuracy; however, inadvertent computerized transcription errors may still be present.

## 2023-07-15 ENCOUNTER — Encounter: Payer: Self-pay | Admitting: Vascular Surgery

## 2023-07-15 ENCOUNTER — Ambulatory Visit (INDEPENDENT_AMBULATORY_CARE_PROVIDER_SITE_OTHER): Payer: BC Managed Care – PPO | Admitting: Vascular Surgery

## 2023-07-15 VITALS — BP 114/72 | HR 76 | Temp 98.2°F | Resp 20 | Ht 66.0 in | Wt 165.0 lb

## 2023-07-15 DIAGNOSIS — I739 Peripheral vascular disease, unspecified: Secondary | ICD-10-CM

## 2023-07-17 DIAGNOSIS — F411 Generalized anxiety disorder: Secondary | ICD-10-CM | POA: Diagnosis not present

## 2023-07-24 DIAGNOSIS — F411 Generalized anxiety disorder: Secondary | ICD-10-CM | POA: Diagnosis not present

## 2023-07-28 ENCOUNTER — Other Ambulatory Visit: Payer: Self-pay

## 2023-07-28 DIAGNOSIS — I739 Peripheral vascular disease, unspecified: Secondary | ICD-10-CM

## 2023-07-31 ENCOUNTER — Telehealth: Payer: Self-pay | Admitting: Pharmacy Technician

## 2023-07-31 ENCOUNTER — Other Ambulatory Visit (HOSPITAL_COMMUNITY): Payer: Self-pay

## 2023-07-31 NOTE — Telephone Encounter (Signed)
Oral Oncology Patient Advocate Encounter  Prior Authorization for Melissa Pineda has been approved.    PA# [id number] Effective dates: 07/01/23 through 07/30/24  Patient must fill at [facility].    Jinger Neighbors, CPhT-Adv Oncology Pharmacy Patient Advocate [facility] Direct Number: [phone]  Fax: [phone]

## 2023-08-01 DIAGNOSIS — F411 Generalized anxiety disorder: Secondary | ICD-10-CM | POA: Diagnosis not present

## 2023-08-05 ENCOUNTER — Encounter (HOSPITAL_COMMUNITY): Payer: BC Managed Care – PPO

## 2023-08-05 ENCOUNTER — Ambulatory Visit: Payer: BC Managed Care – PPO | Admitting: Vascular Surgery

## 2023-08-15 DIAGNOSIS — F411 Generalized anxiety disorder: Secondary | ICD-10-CM | POA: Diagnosis not present

## 2023-08-21 ENCOUNTER — Other Ambulatory Visit: Payer: BC Managed Care – PPO

## 2023-08-21 ENCOUNTER — Ambulatory Visit: Payer: BC Managed Care – PPO | Admitting: Internal Medicine

## 2023-08-26 ENCOUNTER — Inpatient Hospital Stay: Payer: BC Managed Care – PPO | Attending: Internal Medicine

## 2023-08-26 ENCOUNTER — Other Ambulatory Visit: Payer: BC Managed Care – PPO

## 2023-08-26 ENCOUNTER — Other Ambulatory Visit: Payer: Self-pay | Admitting: Internal Medicine

## 2023-08-26 ENCOUNTER — Ambulatory Visit (HOSPITAL_COMMUNITY)
Admission: RE | Admit: 2023-08-26 | Discharge: 2023-08-26 | Disposition: A | Discharge status: Home / Self Care | Payer: BC Managed Care – PPO | Source: Ambulatory Visit | Attending: Internal Medicine | Admitting: Internal Medicine

## 2023-08-26 DIAGNOSIS — K429 Umbilical hernia without obstruction or gangrene: Secondary | ICD-10-CM | POA: Diagnosis not present

## 2023-08-26 DIAGNOSIS — J90 Pleural effusion, not elsewhere classified: Secondary | ICD-10-CM | POA: Diagnosis not present

## 2023-08-26 DIAGNOSIS — C3490 Malignant neoplasm of unspecified part of unspecified bronchus or lung: Secondary | ICD-10-CM | POA: Insufficient documentation

## 2023-08-26 DIAGNOSIS — Z7982 Long term (current) use of aspirin: Secondary | ICD-10-CM | POA: Insufficient documentation

## 2023-08-26 DIAGNOSIS — C3492 Malignant neoplasm of unspecified part of left bronchus or lung: Secondary | ICD-10-CM | POA: Insufficient documentation

## 2023-08-26 DIAGNOSIS — R918 Other nonspecific abnormal finding of lung field: Secondary | ICD-10-CM | POA: Diagnosis not present

## 2023-08-26 DIAGNOSIS — D649 Anemia, unspecified: Secondary | ICD-10-CM | POA: Diagnosis not present

## 2023-08-26 DIAGNOSIS — Z7901 Long term (current) use of anticoagulants: Secondary | ICD-10-CM | POA: Diagnosis not present

## 2023-08-26 DIAGNOSIS — Z7902 Long term (current) use of antithrombotics/antiplatelets: Secondary | ICD-10-CM | POA: Diagnosis not present

## 2023-08-26 DIAGNOSIS — Z79899 Other long term (current) drug therapy: Secondary | ICD-10-CM | POA: Diagnosis not present

## 2023-08-26 DIAGNOSIS — C7951 Secondary malignant neoplasm of bone: Secondary | ICD-10-CM | POA: Insufficient documentation

## 2023-08-26 DIAGNOSIS — N2889 Other specified disorders of kidney and ureter: Secondary | ICD-10-CM | POA: Diagnosis not present

## 2023-08-26 DIAGNOSIS — Z7952 Long term (current) use of systemic steroids: Secondary | ICD-10-CM | POA: Diagnosis not present

## 2023-08-26 LAB — CBC WITH DIFFERENTIAL (CANCER CENTER ONLY)
Abs Immature Granulocytes: 0.01 10*3/uL (ref 0.00–0.07)
Basophils Absolute: 0 10*3/uL (ref 0.0–0.1)
Basophils Relative: 0 %
Eosinophils Absolute: 0 10*3/uL (ref 0.0–0.5)
Eosinophils Relative: 0 %
HCT: 33.8 % — ABNORMAL LOW (ref 36.0–46.0)
Hemoglobin: 11.2 g/dL — ABNORMAL LOW (ref 12.0–15.0)
Immature Granulocytes: 0 %
Lymphocytes Relative: 11 %
Lymphs Abs: 0.6 10*3/uL — ABNORMAL LOW (ref 0.7–4.0)
MCH: 30.9 pg (ref 26.0–34.0)
MCHC: 33.1 g/dL (ref 30.0–36.0)
MCV: 93.1 fL (ref 80.0–100.0)
Monocytes Absolute: 0.1 10*3/uL (ref 0.1–1.0)
Monocytes Relative: 2 %
Neutro Abs: 4.8 10*3/uL (ref 1.7–7.7)
Neutrophils Relative %: 87 %
Platelet Count: 611 10*3/uL — ABNORMAL HIGH (ref 150–400)
RBC: 3.63 MIL/uL — ABNORMAL LOW (ref 3.87–5.11)
RDW: 12.5 % (ref 11.5–15.5)
WBC Count: 5.5 10*3/uL (ref 4.0–10.5)
nRBC: 0 % (ref 0.0–0.2)

## 2023-08-26 LAB — CMP (CANCER CENTER ONLY)
ALT: 16 U/L (ref 0–44)
AST: 18 U/L (ref 15–41)
Albumin: 5.1 g/dL — ABNORMAL HIGH (ref 3.5–5.0)
Alkaline Phosphatase: 78 U/L (ref 38–126)
Anion gap: 8 (ref 5–15)
BUN: 16 mg/dL (ref 6–20)
CO2: 29 mmol/L (ref 22–32)
Calcium: 10.1 mg/dL (ref 8.9–10.3)
Chloride: 105 mmol/L (ref 98–111)
Creatinine: 0.8 mg/dL (ref 0.44–1.00)
GFR, Estimated: >60 mL/min (ref >60)
Glucose, Bld: 148 mg/dL — ABNORMAL HIGH (ref 70–99)
Potassium: 3.8 mmol/L (ref 3.5–5.1)
Sodium: 142 mmol/L (ref 135–145)
Total Bilirubin: 0.3 mg/dL (ref <1.2)
Total Protein: 8.4 g/dL — ABNORMAL HIGH (ref 6.5–8.1)

## 2023-08-26 LAB — CK: Total CK: 169 U/L (ref 38–234)

## 2023-08-26 MED ORDER — IOHEXOL 300 MG/ML  SOLN
100.0000 mL | Freq: Once | INTRAMUSCULAR | Status: AC | PRN
  Administered 2023-08-26: 100 mL via INTRAVENOUS

## 2023-09-02 ENCOUNTER — Inpatient Hospital Stay (HOSPITAL_BASED_OUTPATIENT_CLINIC_OR_DEPARTMENT_OTHER): Payer: BC Managed Care – PPO | Admitting: Internal Medicine

## 2023-09-02 VITALS — BP 137/80 | HR 88 | Temp 98.5°F | Resp 16 | Ht 66.0 in | Wt 169.5 lb

## 2023-09-02 DIAGNOSIS — D649 Anemia, unspecified: Secondary | ICD-10-CM | POA: Diagnosis not present

## 2023-09-02 DIAGNOSIS — Z7902 Long term (current) use of antithrombotics/antiplatelets: Secondary | ICD-10-CM | POA: Diagnosis not present

## 2023-09-02 DIAGNOSIS — C3492 Malignant neoplasm of unspecified part of left bronchus or lung: Secondary | ICD-10-CM | POA: Diagnosis not present

## 2023-09-02 DIAGNOSIS — C7951 Secondary malignant neoplasm of bone: Secondary | ICD-10-CM | POA: Diagnosis not present

## 2023-09-02 DIAGNOSIS — Z79899 Other long term (current) drug therapy: Secondary | ICD-10-CM | POA: Diagnosis not present

## 2023-09-02 DIAGNOSIS — Z7952 Long term (current) use of systemic steroids: Secondary | ICD-10-CM | POA: Diagnosis not present

## 2023-09-02 DIAGNOSIS — Z7982 Long term (current) use of aspirin: Secondary | ICD-10-CM | POA: Diagnosis not present

## 2023-09-02 DIAGNOSIS — Z7901 Long term (current) use of anticoagulants: Secondary | ICD-10-CM | POA: Diagnosis not present

## 2023-09-02 NOTE — Progress Notes (Signed)
[facility] Telephone:([phone]) [phone]   Fax:([phone]) [phone]  OFFICE PROGRESS NOTE  Patient, No Pcp Per No address on file  DIAGNOSIS: Stage IVB (T4, N3, M1b) non-small cell lung cancer, adenocarcinoma presented with large left diaphragmatic surface mass in addition to left hilar, infrahilar, AP window, right and left paratracheal, subcarinal as well as left internal mammary lymphadenopathy in addition to right gastric lymph node and solitary Small right frontal calvarial osseous metastatic disease with large malignant left pleural effusion diagnosed in January 2024.    Molecular studies by [employer] as well as [employer] blood test showed:   EZR-ROS1 fusion approved by FDA Crizotinib, Entrectinib, Repotrectinib   approved in other indication Ceritinib, Lorlatinib Yes      3.6%   PD-L1 expression by [employer] that was 98%.   PRIOR THERAPY: Status post left Pleurx catheter placement for drainage of recurrent left pleural effusion.  This was removed on December 17, 2022.  CURRENT THERAPY: Repotrectinib (Augtyro) 160 mg p.o. daily for the first 2 weeks and then 160 mg p.o. twice daily, first dose November 27, 2022.  She is status post 9 months of treatment  INTERVAL HISTORY: Melissa Pineda 37 y.o. female returns to the clinic today for follow-up visit accompanied by her mother.Discussed the use of AI scribe software for clinical note transcription with the patient, who gave verbal consent to proceed.  History of Present Illness   Melissa Pineda, a 37 year old patient with a history of stage four non-small cell lung cancer (adenocarcinoma), was diagnosed in January 2024. The patient was found to have ROS1 fusion and has been on treatment with repotrectinib (160 mg twice a day) since November 27, 2022.  The patient reports feeling generally well since the last consultation. She has experienced occasional discomfort on the left side, reminiscent of the sensation when she  had fluid accumulation at the time of diagnosis. However, this discomfort is not constant. The patient also reports a persistent issue with a muscle in the leg, which began after carrying a toddler on the beach. The pain is sometimes significant, but there is no visible swelling or redness.  The patient has been managing mild anemia with iron supplements. She stopped taking Plavix and aspirin, hoping it would help with the anemia. However, the patient's hemoglobin remains at 11.2. Despite the anemia, the patient does not feel excessively tired and recovers well after rest.  The patient has not been exercising recently due to anxiety over nodules that were previously identified. However, these nodules have since resolved, likely due to inflammation. The patient has gained a few pounds and acknowledges the need to resume exercise.  The patient's hair, which had been falling out, is growing back, albeit in an unmanageable way.        MEDICAL HISTORY: Past Medical History:  Diagnosis Date   Anxiety    [employer] 11/21/2020   Cancer (HCC)     ALLERGIES:  is allergic to hydrocodone, tramadol, and iodinated contrast media.  MEDICATIONS:  Current Outpatient Medications  Medication Sig Dispense Refill   acetaminophen (TYLENOL) 325 MG tablet Take 650 mg by mouth every 6 (six) hours as needed for mild pain.     ALPRAZolam (XANAX) 0.5 MG tablet Take 1 tablet (0.5 mg total) by mouth 3 (three) times daily as needed for anxiety. 60 tablet 0   ascorbic acid (VITAMIN C) 500 MG tablet Take 500 mg by mouth daily.     aspirin EC 81 MG tablet Take  1 tablet (81 mg total) by mouth daily. Swallow whole. 30 tablet 12   b complex vitamins capsule Take 1 capsule by mouth daily.     Cholecalciferol (VITAMIN D-3 PO) Take 2,000 Units by mouth daily.     clopidogrel (PLAVIX) 75 MG tablet Take 1 tablet (75 mg total) by mouth daily. 30 tablet 11   Ferrous Sulfate (IRON HIGH-POTENCY) 142 (45 Fe) MG TBCR Take 142 mg by  mouth daily.     levonorgestrel (MIRENA, 52 MG,) 20 MCG/DAY IUD 1 each by Intrauterine route once.     metoCLOPramide (REGLAN) 5 MG tablet Take 1 tablet (5 mg total) by mouth every 6 (six) hours as needed for nausea. 30 tablet 1   ondansetron (ZOFRAN) 8 MG tablet Take 1 tablet (8 mg total) by mouth every 8 (eight) hours as needed for nausea or vomiting. 20 tablet 0   ondansetron (ZOFRAN-ODT) 8 MG disintegrating tablet Take 1 tablet (8 mg total) by mouth every 8 (eight) hours as needed for nausea or vomiting. 20 tablet 1   Ondansetron 4 MG FILM Take 4-8 mg by mouth every 6 (six) hours as needed. 15 each 0   pantoprazole (PROTONIX) 40 MG tablet Take 1 tablet (40 mg total) by mouth daily. 30 tablet 3   polyethylene glycol (MIRALAX / GLYCOLAX) 17 g packet Take 17 g by mouth daily as needed for moderate constipation.     predniSONE (DELTASONE) 50 MG tablet Take 1 tablet (50 mg total) by mouth as directed. Take one tablet 13 hours , 1 tablet 7 hours and 1 tablet 1 hour prior to CT scan with IV contrast.  Take Benadryl 50 mg po 1 hour before CT scan 3 tablet 5   repotrectinib (AUGTYRO) 40 MG capsule Take 4 capsules (160 mg total) by mouth 2 (two) times daily. 240 capsule 2   rivaroxaban (XARELTO) 20 MG TABS tablet Take 1 tablet (20 mg total) by mouth daily with supper. 90 tablet 1   Zinc 22.5 MG TABS Take 22.5 mg by mouth daily.     No current facility-administered medications for this visit.    SURGICAL HISTORY:  Past Surgical History:  Procedure Laterality Date   ABDOMINAL AORTOGRAM W/LOWER EXTREMITY N/A 11/27/2022   Procedure: ABDOMINAL AORTOGRAM W/LOWER EXTREMITY;  Surgeon: Leonie Douglas, MD;  Location: [facility];  Service: Cardiovascular;  Laterality: N/A;   IR PERC PLEURAL DRAIN W/INDWELL CATH W/IMG GUIDE  11/09/2022   PERIPHERAL VASCULAR ATHERECTOMY  11/27/2022   Procedure: PERIPHERAL VASCULAR ATHERECTOMY;  Surgeon: Leonie Douglas, MD;  Location: [facility];  Service:  Cardiovascular;;   PERIPHERAL VASCULAR THROMBECTOMY  11/27/2022   Procedure: PERIPHERAL VASCULAR THROMBECTOMY;  Surgeon: Leonie Douglas, MD;  Location: [facility];  Service: Cardiovascular;;   REMOVAL OF PLEURAL DRAINAGE CATHETER N/A 12/17/2022   Procedure: MINOR REMOVAL OF PLEURAL DRAINAGE CATHETER;  Surgeon: Josephine Igo, DO;  Location: [facility] ENDOSCOPY;  Service: Cardiopulmonary;  Laterality: N/A;   TEE WITHOUT CARDIOVERSION N/A 06/10/2023   Procedure: TRANSESOPHAGEAL ECHOCARDIOGRAM;  Surgeon: Chilton Si, MD;  Location: [facility] INVASIVE CV LAB;  Service: Cardiovascular;  Laterality: N/A;   WISDOM TOOTH EXTRACTION      REVIEW OF SYSTEMS:  Constitutional: negative Eyes: negative Ears, nose, mouth, throat, and face: negative Respiratory: negative Cardiovascular: negative Gastrointestinal: negative Genitourinary:negative Integument/breast: negative Hematologic/lymphatic: negative Musculoskeletal:negative Neurological: negative Behavioral/Psych: negative Endocrine: negative Allergic/Immunologic: negative   PHYSICAL EXAMINATION: General appearance: alert, cooperative, and no distress Head: Normocephalic, without obvious  abnormality, atraumatic Neck: no adenopathy, no JVD, supple, symmetrical, trachea midline, and thyroid not enlarged, symmetric, no tenderness/mass/nodules Lymph nodes: Cervical, supraclavicular, and axillary nodes normal. Resp: clear to auscultation bilaterally Back: symmetric, no curvature. ROM normal. No CVA tenderness. Cardio: regular rate and rhythm, S1, S2 normal, no murmur, click, rub or gallop GI: soft, non-tender; bowel sounds normal; no masses,  no organomegaly Extremities: extremities normal, atraumatic, no cyanosis or edema Neurologic: Alert and oriented X 3, normal strength and tone. Normal symmetric reflexes. Normal coordination and gait  ECOG PERFORMANCE STATUS: 0 - Asymptomatic  Blood pressure 137/80, pulse 88, temperature 98.5 F (36.9 C),  temperature source Temporal, resp. rate 16, height 5\' 6"  (1.676 m), weight 169 lb 8 oz (76.9 kg), SpO2 100%, not currently breastfeeding.  LABORATORY DATA: Lab Results  Component Value Date   WBC 5.5 08/26/2023   HGB 11.2 (L) 08/26/2023   HCT 33.8 (L) 08/26/2023   MCV 93.1 08/26/2023   PLT 611 (H) 08/26/2023      Chemistry      Component Value Date/Time   NA 142 08/26/2023 1540   NA 142 05/26/2023 0938   K 3.8 08/26/2023 1540   CL 105 08/26/2023 1540   CO2 29 08/26/2023 1540   BUN 16 08/26/2023 1540   BUN 17 05/26/2023 0938   CREATININE 0.80 08/26/2023 1540      Component Value Date/Time   CALCIUM 10.1 08/26/2023 1540   ALKPHOS 78 08/26/2023 1540   AST 18 08/26/2023 1540   ALT 16 08/26/2023 1540   BILITOT 0.3 08/26/2023 1540       RADIOGRAPHIC STUDIES: CT CHEST ABDOMEN PELVIS W CONTRAST  Result Date: 08/31/2023 CLINICAL DATA:  Metastatic lung cancer restaging * Tracking Code: BO * EXAM: CT CHEST, ABDOMEN, AND PELVIS WITH CONTRAST TECHNIQUE: Multidetector CT imaging of the chest, abdomen and pelvis was performed following the standard protocol during bolus administration of intravenous contrast. RADIATION DOSE REDUCTION: This exam was performed according to the departmental dose-optimization program which includes automated exposure control, adjustment of the mA and/or kV according to patient size and/or use of iterative reconstruction technique. CONTRAST:  OMNIPAQUE IOHEXOL 300 MG/ML  SOLN COMPARISON:  07/02/2023 FINDINGS: CT CHEST FINDINGS Cardiovascular: No significant vascular findings. Normal heart size. No pericardial effusion. Mediastinum/Nodes: No enlarged mediastinal, hilar, or axillary lymph nodes. Thymic remnant in the anterior mediastinum thyroid gland, trachea, and esophagus demonstrate no significant findings. Lungs/Pleura: Unchanged post treatment appearance of the left lung base with irregular residua of an infrahilar mass as well as irregular scarring or  atelectasis and interlobular septal thickening (series 4, image 125). Unchanged trace left pleural effusion with associated pleural thickening. Nearly complete resolution of previously seen scattered ground-glass opacities in the left lung base. No pleural effusion or pneumothorax. Musculoskeletal: No chest wall abnormality. No acute osseous findings. CT ABDOMEN PELVIS FINDINGS Hepatobiliary: No solid liver abnormality is seen. No gallstones, gallbladder wall thickening, or biliary dilatation. Pancreas: Unremarkable. No pancreatic ductal dilatation or surrounding inflammatory changes. Spleen: Normal in size without significant abnormality. Adrenals/Urinary Tract: Adrenal glands are unremarkable. Mild bilateral caliectasis, unchanged (series 2, image 68). Kidneys are otherwise normal, without renal calculi, solid lesion, or overt hydronephrosis. Bladder is unremarkable. Stomach/Bowel: Stomach is within normal limits. Appendix appears normal. No evidence of bowel wall thickening, distention, or inflammatory changes. Vascular/Lymphatic: No significant vascular findings are present. No enlarged abdominal or pelvic lymph nodes. Reproductive: No mass or other abnormality. IUD present in the fundal endometrial cavity Other: Small umbilical hernia (series  2, image 87).  No ascites. Musculoskeletal: No acute osseous findings. IMPRESSION: 1. Unchanged post treatment appearance of the left lung base with irregular residua of an infrahilar mass as well as irregular scarring or atelectasis and interlobular septal thickening. 2. Unchanged trace left pleural effusion with associated pleural thickening. 3. Nearly complete resolution of previously seen scattered ground-glass opacities in the left lung base, consistent with resolution of nonspecific infection or inflammation. 4. No evidence of lymphadenopathy or metastatic disease in the abdomen or pelvis. Electronically Signed   By: Jearld Lesch M.D.   On: 08/31/2023 21:00     ASSESSMENT AND PLAN: This is a very pleasant 37 years old white female with  Stage IVB (T4, N3, M1b) non-small cell lung cancer, adenocarcinoma presented with large left diaphragmatic surface mass in addition to left hilar, infrahilar, AP window, right and left paratracheal, subcarinal as well as left internal mammary lymphadenopathy in addition to right gastric lymph node and solitary small right frontal calvarial osseous metastatic disease with large malignant left pleural effusion diagnosed in January 2024.   Fortunately her molecular studies showed positive ROS1 fusion.  The patient also had PD-L1 expression of 98%. The patient is currently undergoing treatment was started therapy with repotrectinib started November 27, 2022.  Status post 9 months of treatment and has been tolerating it fairly well. She had repeat CT scan of the chest, abdomen and pelvis performed recently.  I personally and independently reviewed the scan images and discussed the result with the patient and her mother. Her scan showed stable disease with resolution of the previously scattered groundglass opacities in the left lung base consistent with resolution of nonspecific infection or inflammation. Assessment and Plan    Stage IV Non-Small Cell Lung Cancer (NSCLC) with ROS1 Fusion Diagnosed in January 2024. On repotrectinib 160 mg twice daily since November 27, 2022. Recent imaging shows resolution of previously concerning nodules, likely due to inflammation. Blood work shows mild anemia (hemoglobin 11.2). Discussed the importance of continuing repotrectinib and potential side effects. Emphasized regular follow-up imaging and blood work to monitor disease progression and treatment response. - Continue repotrectinib 160 mg twice daily - Order blood work in six weeks - Order blood work and scan in twelve weeks - Encourage regular exercise to combat fatigue - Advise against heavy lifting; recommend treadmill, elliptical, and  swimming  Left-Sided Chest Pain Intermittent pain likely related to previous cancer site and associated scarring. No new concerning symptoms observed. Advised to monitor symptoms and report any changes or worsening of pain. - Monitor symptoms - Report any changes or worsening of pain  Mild Anemia Hemoglobin 11.2, likely due to menstrual blood loss. No significant fatigue reported. Discussed the importance of continuing iron supplementation and monitoring hemoglobin levels. - Continue iron supplementation  Muscle Pain Pain likely due to carrying a toddler. No swelling or redness observed. Advised use of ibuprofen 200 mg as needed, not regularly. Discussed monitoring symptoms, especially with increased exercise. - Use ibuprofen 200 mg as needed, not regularly - Monitor symptoms, especially with increased exercise.   The patient was advised to call immediately if she has any other concerning symptoms in the interval. The patient voices understanding of current disease status and treatment options and is in agreement with the current care plan.  All questions were answered. The patient knows to call the clinic with any problems, questions or concerns. We can certainly see the patient much sooner if necessary.  The total time spent in the appointment was 30  minutes.  Disclaimer: This note was dictated with voice recognition software. Similar sounding words can inadvertently be transcribed and may not be corrected upon review.

## 2023-09-23 ENCOUNTER — Other Ambulatory Visit: Payer: Self-pay

## 2023-09-23 MED ORDER — PANTOPRAZOLE SODIUM 40 MG PO TBEC: 40.0000 mg | DELAYED_RELEASE_TABLET | Freq: Every day | ORAL | 3 refills | Status: DC

## 2023-10-01 ENCOUNTER — Other Ambulatory Visit (HOSPITAL_COMMUNITY): Payer: Self-pay

## 2023-10-01 ENCOUNTER — Telehealth: Payer: Self-pay | Admitting: Pharmacy Technician

## 2023-10-01 ENCOUNTER — Other Ambulatory Visit: Payer: Self-pay

## 2023-10-01 DIAGNOSIS — C3492 Malignant neoplasm of unspecified part of left bronchus or lung: Secondary | ICD-10-CM

## 2023-10-01 MED ORDER — REPOTRECTINIB 40 MG PO CAPS: 160.0000 mg | ORAL_CAPSULE | Freq: Two times a day (BID) | ORAL | 2 refills | Status: DC

## 2023-10-01 NOTE — Telephone Encounter (Signed)
Oral Oncology Patient Advocate Encounter   Was successful in obtaining a copay card for Augtyro.  This copay card will make the patients copay $0.  I have spoken with the patient.    The billing information is as follows and has been shared with [facility].   RxBin: [id number]  PCN: [id number] Member ID: [id number] Group ID: [id number]   Jinger Neighbors, CPhT-Adv Oncology Pharmacy Patient Advocate [facility] Direct Number: [phone]  Fax: [phone]

## 2023-10-02 ENCOUNTER — Other Ambulatory Visit (HOSPITAL_COMMUNITY): Payer: Self-pay

## 2023-10-08 ENCOUNTER — Other Ambulatory Visit: Payer: Self-pay | Admitting: Nurse Practitioner

## 2023-10-08 DIAGNOSIS — F419 Anxiety disorder, unspecified: Secondary | ICD-10-CM

## 2023-10-08 DIAGNOSIS — C3492 Malignant neoplasm of unspecified part of left bronchus or lung: Secondary | ICD-10-CM

## 2023-10-08 DIAGNOSIS — Z515 Encounter for palliative care: Secondary | ICD-10-CM

## 2023-10-08 MED ORDER — ALPRAZOLAM 0.5 MG PO TABS: 0.5000 mg | ORAL_TABLET | Freq: Three times a day (TID) | ORAL | 0 refills | Status: DC | PRN

## 2023-10-20 ENCOUNTER — Other Ambulatory Visit: Payer: Self-pay | Admitting: Physician Assistant

## 2023-10-24 ENCOUNTER — Telehealth: Payer: Self-pay

## 2023-10-24 NOTE — Telephone Encounter (Signed)
 PA request for Xarelto 20 mg tablets was faxed.   Submission completed via covermymeds. PA auth approved.

## 2023-10-29 ENCOUNTER — Inpatient Hospital Stay: Payer: BC Managed Care – PPO | Admitting: Internal Medicine

## 2023-10-29 ENCOUNTER — Inpatient Hospital Stay: Payer: BC Managed Care – PPO | Attending: Internal Medicine

## 2023-10-29 ENCOUNTER — Telehealth: Payer: Self-pay | Admitting: Nutrition

## 2023-10-29 VITALS — BP 124/83 | HR 87 | Temp 98.0°F | Resp 17 | Ht 66.0 in | Wt 175.4 lb

## 2023-10-29 DIAGNOSIS — C3492 Malignant neoplasm of unspecified part of left bronchus or lung: Secondary | ICD-10-CM | POA: Insufficient documentation

## 2023-10-29 DIAGNOSIS — C7951 Secondary malignant neoplasm of bone: Secondary | ICD-10-CM | POA: Insufficient documentation

## 2023-10-29 DIAGNOSIS — C3490 Malignant neoplasm of unspecified part of unspecified bronchus or lung: Secondary | ICD-10-CM

## 2023-10-29 DIAGNOSIS — D649 Anemia, unspecified: Secondary | ICD-10-CM | POA: Insufficient documentation

## 2023-10-29 DIAGNOSIS — Z7982 Long term (current) use of aspirin: Secondary | ICD-10-CM | POA: Diagnosis not present

## 2023-10-29 DIAGNOSIS — K449 Diaphragmatic hernia without obstruction or gangrene: Secondary | ICD-10-CM | POA: Diagnosis not present

## 2023-10-29 DIAGNOSIS — Z79899 Other long term (current) drug therapy: Secondary | ICD-10-CM | POA: Insufficient documentation

## 2023-10-29 LAB — CBC WITH DIFFERENTIAL (CANCER CENTER ONLY)
Abs Immature Granulocytes: 0.02 10*3/uL (ref 0.00–0.07)
Basophils Absolute: 0 10*3/uL (ref 0.0–0.1)
Basophils Relative: 0 %
Eosinophils Absolute: 0.1 10*3/uL (ref 0.0–0.5)
Eosinophils Relative: 2 %
HCT: 31.9 % — ABNORMAL LOW (ref 36.0–46.0)
Hemoglobin: 10.5 g/dL — ABNORMAL LOW (ref 12.0–15.0)
Immature Granulocytes: 0 %
Lymphocytes Relative: 31 %
Lymphs Abs: 1.8 10*3/uL (ref 0.7–4.0)
MCH: 30.3 pg (ref 26.0–34.0)
MCHC: 32.9 g/dL (ref 30.0–36.0)
MCV: 92.2 fL (ref 80.0–100.0)
Monocytes Absolute: 0.4 10*3/uL (ref 0.1–1.0)
Monocytes Relative: 8 %
Neutro Abs: 3.3 10*3/uL (ref 1.7–7.7)
Neutrophils Relative %: 59 %
Platelet Count: 496 10*3/uL — ABNORMAL HIGH (ref 150–400)
RBC: 3.46 MIL/uL — ABNORMAL LOW (ref 3.87–5.11)
RDW: 12.5 % (ref 11.5–15.5)
WBC Count: 5.7 10*3/uL (ref 4.0–10.5)
nRBC: 0 % (ref 0.0–0.2)

## 2023-10-29 LAB — CMP (CANCER CENTER ONLY)
ALT: 13 U/L (ref 0–44)
AST: 18 U/L (ref 15–41)
Albumin: 4.6 g/dL (ref 3.5–5.0)
Alkaline Phosphatase: 69 U/L (ref 38–126)
Anion gap: 6 (ref 5–15)
BUN: 17 mg/dL (ref 6–20)
CO2: 28 mmol/L (ref 22–32)
Calcium: 9.1 mg/dL (ref 8.9–10.3)
Chloride: 104 mmol/L (ref 98–111)
Creatinine: 0.79 mg/dL (ref 0.44–1.00)
GFR, Estimated: >60 mL/min (ref >60)
Glucose, Bld: 125 mg/dL — ABNORMAL HIGH (ref 70–99)
Potassium: 3.8 mmol/L (ref 3.5–5.1)
Sodium: 138 mmol/L (ref 135–145)
Total Bilirubin: 0.4 mg/dL (ref 0.0–1.2)
Total Protein: 7.3 g/dL (ref 6.5–8.1)

## 2023-10-29 LAB — FERRITIN: Ferritin: 37 ng/mL (ref 11–307)

## 2023-10-29 LAB — IRON AND IRON BINDING CAPACITY (CC-WL,HP ONLY)
Iron: 109 ug/dL (ref 28–170)
Saturation Ratios: 33 % — ABNORMAL HIGH (ref 10.4–31.8)
TIBC: 330 ug/dL (ref 250–450)
UIBC: 221 ug/dL (ref 148–442)

## 2023-10-29 LAB — CK: Total CK: 165 U/L (ref 38–234)

## 2023-10-29 NOTE — Telephone Encounter (Signed)
 Scheduled appointment per scheduling message. Patient is aware of the appointment made.

## 2023-10-29 NOTE — Progress Notes (Signed)
 [facility] [phone]   [phone]  OFFICE PROGRESS NOTE  Patient, No Pcp Per No address on file  DIAGNOSIS: Stage IVB (T4, N3, M1b) non-small cell lung cancer, adenocarcinoma presented with large left diaphragmatic surface mass in addition to left hilar, infrahilar, AP window, right and left paratracheal, subcarinal as well as left internal mammary lymphadenopathy in addition to right gastric lymph node and solitary Small right frontal calvarial osseous metastatic disease with large malignant left pleural effusion diagnosed in January 2024.    Molecular studies by foundation [employer] as well as [employer] blood test showed:   EZR-ROS1 fusion approved by FDA Crizotinib, Entrectinib, Repotrectinib    approved in other indication Ceritinib, Lorlatinib Yes      3.6%   PD-L1 expression by foundation 1 that was 98%.   PRIOR THERAPY: Status post left Pleurx catheter placement for drainage of recurrent left pleural effusion.  This was removed on December 17, 2022.  CURRENT THERAPY: Repotrectinib  (Augtyro ) 160 mg p.o. daily for the first 2 weeks and then 160 mg p.o. twice daily, first dose November 27, 2022.  Melissa Pineda is status post 11 months of treatment  INTERVAL HISTORY: Melissa Pineda 38 y.o. female returns to the clinic today for follow-up visit. Discussed the use of AI scribe software for clinical note transcription with the patient, who gave verbal consent to proceed.  History of Present Illness   The patient, diagnosed with non-small cell lung cancer and adenocarcinoma, has been on a regimen of 160mg  of Rebotrectinib twice daily. Over the past two months, the patient has reported increased fatigue and a drop in hemoglobin levels to 10.5, despite daily iron  supplementation. The patient has also experienced occasional headaches, which Melissa Pineda attributes to tension and screen time, as Melissa Pineda continues to work.  The patient has also reported shortness of breath, but  it is unclear whether this is due to her medical condition or a lack of exercise. Melissa Pineda plans to start an exercise regimen soon.  The patient has been experiencing regular menstrual cycles with no heavy clots. This has been causing some frustration for the patient.  The patient has expressed interest in starting a B12 supplement, but is concerned about potential interactions with other supplements. Melissa Pineda plans to consult with a pharmacist before starting the supplement.  The patient also mentioned a recent diagnosis of a hiatal hernia from a scan, but reports no symptoms related to this condition. Melissa Pineda has expressed a desire to avoid surgery if possible.  The patient has requested a follow-up scan in a month, rather than the usual two months, due to anxiety about potential changes in her condition. Melissa Pineda has also requested a referral to a dietician.        MEDICAL HISTORY: Past Medical History:  Diagnosis Date   Anxiety    [employer] 11/21/2020   Cancer (HCC)     ALLERGIES:  is allergic to hydrocodone , tramadol , and iodinated contrast media.  MEDICATIONS:  Current Outpatient Medications  Medication Sig Dispense Refill   acetaminophen  (TYLENOL ) 325 MG tablet Take 650 mg by mouth every 6 (six) hours as needed for mild pain.     ALPRAZolam  (XANAX ) 0.5 MG tablet Take 1 tablet (0.5 mg total) by mouth 3 (three) times daily as needed for anxiety. 60 tablet 0   ascorbic acid (VITAMIN C) 500 MG tablet Take 500 mg by mouth daily.     aspirin  EC 81 MG tablet Take 1 tablet (81 mg total) by mouth daily.  Swallow whole. 30 tablet 12   b complex vitamins capsule Take 1 capsule by mouth daily.     Cholecalciferol (VITAMIN D-3 PO) Take 2,000 Units by mouth daily.     clopidogrel  (PLAVIX ) 75 MG tablet Take 1 tablet (75 mg total) by mouth daily. 30 tablet 11   Ferrous Sulfate (IRON  HIGH-POTENCY) 142 (45 Fe) MG TBCR Take 142 mg by mouth daily.     levonorgestrel  (MIRENA , 52 MG,) 20 MCG/DAY IUD 1 each by  Intrauterine route once.     metoCLOPramide  (REGLAN ) 5 MG tablet Take 1 tablet (5 mg total) by mouth every 6 (six) hours as needed for nausea. 30 tablet 1   ondansetron  (ZOFRAN ) 8 MG tablet Take 1 tablet (8 mg total) by mouth every 8 (eight) hours as needed for nausea or vomiting. 20 tablet 0   ondansetron  (ZOFRAN -ODT) 8 MG disintegrating tablet Take 1 tablet (8 mg total) by mouth every 8 (eight) hours as needed for nausea or vomiting. 20 tablet 1   Ondansetron  4 MG FILM Take 4-8 mg by mouth every 6 (six) hours as needed. 15 each 0   pantoprazole  (PROTONIX ) 40 MG tablet Take 1 tablet (40 mg total) by mouth daily. 30 tablet 3   polyethylene glycol (MIRALAX  / GLYCOLAX ) 17 g packet Take 17 g by mouth daily as needed for moderate constipation.     predniSONE  (DELTASONE ) 50 MG tablet Take 1 tablet (50 mg total) by mouth as directed. Take one tablet 13 hours , 1 tablet 7 hours and 1 tablet 1 hour prior to CT scan with IV contrast.  Take Benadryl  50 mg po 1 hour before CT scan 3 tablet 5   repotrectinib  (AUGTYRO ) 40 MG capsule Take 4 capsules (160 mg total) by mouth 2 (two) times daily. 240 capsule 2   XARELTO  20 MG TABS tablet TAKE 1 TABLET(20 MG) BY MOUTH DAILY WITH SUPPER 90 tablet 1   Zinc 22.5 MG TABS Take 22.5 mg by mouth daily.     No current facility-administered medications for this visit.    SURGICAL HISTORY:  Past Surgical History:  Procedure Laterality Date   ABDOMINAL AORTOGRAM W/LOWER EXTREMITY N/A 11/27/2022   Procedure: ABDOMINAL AORTOGRAM W/LOWER EXTREMITY;  Surgeon: Magda Debby SAILOR, MD;  Location: [facility];  Service: Cardiovascular;  Laterality: N/A;   IR PERC PLEURAL DRAIN W/INDWELL CATH W/IMG GUIDE  11/09/2022   PERIPHERAL VASCULAR ATHERECTOMY  11/27/2022   Procedure: PERIPHERAL VASCULAR ATHERECTOMY;  Surgeon: Magda Debby SAILOR, MD;  Location: [facility];  Service: Cardiovascular;;   PERIPHERAL VASCULAR THROMBECTOMY  11/27/2022   Procedure: PERIPHERAL VASCULAR  THROMBECTOMY;  Surgeon: Magda Debby SAILOR, MD;  Location: [facility];  Service: Cardiovascular;;   REMOVAL OF PLEURAL DRAINAGE CATHETER N/A 12/17/2022   Procedure: MINOR REMOVAL OF PLEURAL DRAINAGE CATHETER;  Surgeon: Brenna Adine CROME, DO;  Location: [facility];  Service: Cardiopulmonary;  Laterality: N/A;   TEE WITHOUT CARDIOVERSION N/A 06/10/2023   Procedure: TRANSESOPHAGEAL ECHOCARDIOGRAM;  Surgeon: Raford Riggs, MD;  Location: [facility] INVASIVE CV LAB;  Service: Cardiovascular;  Laterality: N/A;   WISDOM TOOTH EXTRACTION      REVIEW OF SYSTEMS:  Constitutional: positive for fatigue Eyes: negative Ears, nose, mouth, throat, and face: negative Respiratory: negative Cardiovascular: negative Gastrointestinal: negative Genitourinary:negative Integument/breast: negative Hematologic/lymphatic: negative Musculoskeletal:negative Neurological: positive for headaches Behavioral/Psych: negative Endocrine: negative Allergic/Immunologic: negative   PHYSICAL EXAMINATION: General appearance: alert, cooperative, and no distress Head: Normocephalic, without obvious abnormality, atraumatic Neck: no adenopathy, no JVD,  supple, symmetrical, trachea midline, and thyroid  not enlarged, symmetric, no tenderness/mass/nodules Lymph nodes: Cervical, supraclavicular, and axillary nodes normal. Resp: clear to auscultation bilaterally Back: symmetric, no curvature. ROM normal. No CVA tenderness. Cardio: regular rate and rhythm, S1, S2 normal, no murmur, click, rub or gallop GI: soft, non-tender; bowel sounds normal; no masses,  no organomegaly Extremities: extremities normal, atraumatic, no cyanosis or edema Neurologic: Alert and oriented X 3, normal strength and tone. Normal symmetric reflexes. Normal coordination and gait  ECOG PERFORMANCE STATUS: 0 - Asymptomatic  Blood pressure 124/83, pulse 87, temperature 98 F (36.7 C), temperature source Temporal, resp. rate 17, height 5' 6 (1.676 m), weight  175 lb 6.4 oz (79.6 kg), SpO2 100%, not currently breastfeeding.  LABORATORY DATA: Lab Results  Component Value Date   WBC 5.7 10/29/2023   HGB 10.5 (L) 10/29/2023   HCT 31.9 (L) 10/29/2023   MCV 92.2 10/29/2023   PLT 496 (H) 10/29/2023      Chemistry      Component Value Date/Time   NA 142 08/26/2023 1540   NA 142 05/26/2023 0938   K 3.8 08/26/2023 1540   CL 105 08/26/2023 1540   CO2 29 08/26/2023 1540   BUN 16 08/26/2023 1540   BUN 17 05/26/2023 0938   CREATININE 0.80 08/26/2023 1540      Component Value Date/Time   CALCIUM 10.1 08/26/2023 1540   ALKPHOS 78 08/26/2023 1540   AST 18 08/26/2023 1540   ALT 16 08/26/2023 1540   BILITOT 0.3 08/26/2023 1540       RADIOGRAPHIC STUDIES: No results found.  ASSESSMENT AND PLAN: This is a very pleasant 38 years old white female with  Stage IVB (T4, N3, M1b) non-small cell lung cancer, adenocarcinoma presented with large left diaphragmatic surface mass in addition to left hilar, infrahilar, AP window, right and left paratracheal, subcarinal as well as left internal mammary lymphadenopathy in addition to right gastric lymph node and solitary small right frontal calvarial osseous metastatic disease with large malignant left pleural effusion diagnosed in January 2024.   Fortunately her molecular studies showed positive ROS1 fusion.  The patient also had PD-L1 expression of 98%. The patient is currently undergoing treatment was started therapy with repotrectinib  started November 27, 2022.  Status post 11 months of treatment and has been tolerating it fairly well.    Non-Small Cell Lung Cancer (NSCLC) with Adenocarcinoma NSCLC with adenocarcinoma, currently on  Rebotrectinib 160 mg twice daily. Reports increased fatigue, likely due to anemia (hemoglobin 10.5 g/dL). No significant chest pain, dyspnea, or other severe symptoms. Discussed radiation risks with frequent scans and patient's preference for more frequent monitoring. Agreed to  schedule a CT scan in one month. - Continue  Rebotrectinib 160 mg twice daily - Order labs to monitor hemoglobin - Schedule CT scan in one month - Refer to dietician - Provide 'Living with Lung Cancer' book  Anemia Mild anemia with hemoglobin at 10.5 g/dL. Inconsistent iron  supplementation noted but has resumed daily intake. Discussed benefits of taking iron  every other day to improve absorption and reduce receptor suppression. - Continue iron  supplementation every other day with vitamin C  Hiatal Hernia Hiatal hernia noted on last scan. No current symptoms of acid reflux or other complications. Surgery not indicated due to lack of symptoms and invasive nature of procedure. - Monitor for symptoms of acid reflux or other complications  General Health Maintenance Discussed starting vitamin B12 supplementation and over-the-counter options. Encouraged regular exercise to improve overall health and reduce  fatigue. - Consult with pharmacist regarding appropriate B12 supplement - Encourage regular exercise  Follow-up - Schedule follow-up appointment in one month - Order CT scan one week before follow-up appointment.   The patient was advised to call immediately if Melissa Pineda has any other concerning symptoms in the interval. The patient voices understanding of current disease status and treatment options and is in agreement with the current care plan.  All questions were answered. The patient knows to call the clinic with any problems, questions or concerns. We can certainly see the patient much sooner if necessary.  The total time spent in the appointment was 30 minutes.  Disclaimer: This note was dictated with voice recognition software. Similar sounding words can inadvertently be transcribed and may not be corrected upon review.

## 2023-11-03 ENCOUNTER — Telehealth: Payer: Self-pay | Admitting: Internal Medicine

## 2023-11-03 ENCOUNTER — Other Ambulatory Visit: Payer: BC Managed Care – PPO

## 2023-11-03 ENCOUNTER — Ambulatory Visit: Payer: BC Managed Care – PPO | Admitting: Physician Assistant

## 2023-11-04 ENCOUNTER — Inpatient Hospital Stay: Payer: BC Managed Care – PPO | Admitting: Nutrition

## 2023-11-04 ENCOUNTER — Encounter: Payer: Self-pay | Admitting: Nutrition

## 2023-11-04 NOTE — Progress Notes (Signed)
 Patient did not show up for nutrition appointment.

## 2023-11-05 ENCOUNTER — Telehealth: Payer: Self-pay | Admitting: Internal Medicine

## 2023-11-05 NOTE — Telephone Encounter (Signed)
 Contacted the patient to see if she wanted to reschedule her Nutrition appointment per scheduling message. Left a voicemail to call back.

## 2023-11-11 ENCOUNTER — Encounter: Payer: Self-pay | Admitting: Internal Medicine

## 2023-11-11 DIAGNOSIS — M76821 Posterior tibial tendinitis, right leg: Secondary | ICD-10-CM | POA: Diagnosis not present

## 2023-11-14 ENCOUNTER — Ambulatory Visit (INDEPENDENT_AMBULATORY_CARE_PROVIDER_SITE_OTHER): Payer: BC Managed Care – PPO | Admitting: Physician Assistant

## 2023-11-14 ENCOUNTER — Other Ambulatory Visit (HOSPITAL_COMMUNITY): Payer: Self-pay | Admitting: Vascular Surgery

## 2023-11-14 ENCOUNTER — Ambulatory Visit (INDEPENDENT_AMBULATORY_CARE_PROVIDER_SITE_OTHER)
Admission: RE | Admit: 2023-11-14 | Discharge: 2023-11-14 | Disposition: A | Discharge status: Home / Self Care | Payer: BC Managed Care – PPO | Source: Ambulatory Visit | Attending: Vascular Surgery | Admitting: Vascular Surgery

## 2023-11-14 ENCOUNTER — Ambulatory Visit (HOSPITAL_COMMUNITY)
Admission: RE | Admit: 2023-11-14 | Discharge: 2023-11-14 | Disposition: A | Discharge status: Home / Self Care | Payer: BC Managed Care – PPO | Source: Ambulatory Visit | Attending: Vascular Surgery | Admitting: Vascular Surgery

## 2023-11-14 VITALS — BP 135/80 | HR 92 | Temp 98.3°F | Resp 16 | Ht 66.0 in | Wt 170.5 lb

## 2023-11-14 DIAGNOSIS — I739 Peripheral vascular disease, unspecified: Secondary | ICD-10-CM

## 2023-11-14 DIAGNOSIS — R52 Pain, unspecified: Secondary | ICD-10-CM | POA: Insufficient documentation

## 2023-11-14 DIAGNOSIS — I70223 Atherosclerosis of native arteries of extremities with rest pain, bilateral legs: Secondary | ICD-10-CM | POA: Diagnosis not present

## 2023-11-14 LAB — VAS US ABI WITH/WO TBI
Left ABI: 0.79
Right ABI: 0.84

## 2023-11-14 NOTE — Progress Notes (Signed)
[facility] [place] HISTORY AND PHYSICAL   History of Present Illness:  Patient is a 38 y.o. year old female who presents for evaluation of right LE ankle pain.  She states she does not remember trauma to the right LE.  She states at first she had pain from the hip to the ankle.  Then she noticed ankle swelling.  She was seen at [facility] and no fracture where seen.  They suggested possible tendon strain.  She no loner has leg pain and very slight pain in the medial ankle around the PT tendon.   She is going on a cruise and was worried about her blood flow.     With her history of stage IV adenocarcinoma of the lung recently. 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  She had malignant effusion and required a Pleurx catheter.  Her leg pain when she was first seen by [place] was described as shine splints then became rest pain.  She recovered from subacute bilateral lower extremity ischemia causing rest pain in the left lower extremity and claudication in the right lower extremity. Likely from cardioembolism or tumor embolism to lower extremities. This was managed with successful endovascular thrombectomy 11/27/22 with resolution of rest pain.     Past Medical History:  Diagnosis Date   Anxiety    [employer]reesia 11/21/2020   Cancer [facility])     Past Surgical History:  Procedure Laterality Date   ABDOMINAL AORTOGRAM W/LOWER EXTREMITY N/A 11/27/2022   Procedure: ABDOMINAL AORTOGRAM W/LOWER EXTREMITY;  Surgeon: Leonie Douglas, MD;  Location: [facility];  Service: Cardiovascular;  Laterality: N/A;   IR PERC PLEURAL DRAIN W/INDWELL CATH W/IMG GUIDE  11/09/2022   PERIPHERAL VASCULAR ATHERECTOMY  11/27/2022   Procedure: PERIPHERAL VASCULAR ATHERECTOMY;  Surgeon: Leonie Douglas, MD;  Location: [facility];  Service: Cardiovascular;;   PERIPHERAL VASCULAR THROMBECTOMY  11/27/2022   Procedure: PERIPHERAL VASCULAR THROMBECTOMY;  Surgeon: Leonie Douglas,  MD;  Location: [facility];  Service: Cardiovascular;;   REMOVAL OF PLEURAL DRAINAGE CATHETER N/A 12/17/2022   Procedure: MINOR REMOVAL OF PLEURAL DRAINAGE CATHETER;  Surgeon: Josephine Igo, DO;  Location: [facility];  Service: Cardiopulmonary;  Laterality: N/A;   TEE WITHOUT CARDIOVERSION N/A 06/10/2023   Procedure: TRANSESOPHAGEAL ECHOCARDIOGRAM;  Surgeon: Chilton Si, MD;  Location: [facility] INVASIVE CV LAB;  Service: Cardiovascular;  Laterality: N/A;   WISDOM TOOTH EXTRACTION      ROS:   General:  No weight loss, Fever, chills  HEENT: No recent headaches, no nasal bleeding, no visual changes, no sore throat  Neurologic: No dizziness, blackouts, seizures. No recent symptoms of stroke or mini- stroke. No recent episodes of slurred speech, or temporary blindness.  Cardiac: No recent episodes of chest pain/pressure, no shortness of breath at rest.  No shortness of breath with exertion.  Denies history of atrial fibrillation or irregular heartbeat  Vascular: No history of rest pain in feet.  No history of claudication.  No history of non-healing ulcer, No history of DVT   Pulmonary: No home oxygen, no productive cough, no hemoptysis,  No asthma or wheezing  Musculoskeletal:  [ ]  Arthritis, [ ]  Low back pain,  [ ]  Joint pain  Hematologic:No history of hypercoagulable state.  No history of easy bleeding.  No history of anemia  Gastrointestinal: No hematochezia or melena,  No gastroesophageal reflux, no trouble swallowing  Urinary: [ ]  chronic Kidney disease, [ ]  on  HD - [ ]  MWF or [ ]  TTHS, [ ]  Burning with urination, [ ]  Frequent urination, [ ]  Difficulty urinating;   Skin: No rashes  Psychological: No history of anxiety,  No history of depression  Social History Social History   Tobacco Use   Smoking status: Never   Smokeless tobacco: Never  Vaping Use   Vaping status: Never Used  Substance Use Topics   Alcohol use: Yes    Alcohol/week: 7.0 standard drinks of  alcohol    Types: 7 Glasses of wine per week    Comment: 1 glass wine in the evening    Drug use: Yes    Types: Marijuana    Comment: occasional    Family History Family History  Problem Relation Age of Onset   Cancer Mother    Cancer Maternal Grandfather    Cancer Maternal Grandmother    Cancer Paternal Grandfather    Hypertension Paternal Grandmother     Allergies  Allergies  Allergen Reactions   Hydrocodone Nausea Only    Dizzy, "crawl out of my skin"   Tramadol Other (See Comments)    "Weird feeling"   Iodinated Contrast Media Hives     Current Outpatient Medications  Medication Sig Dispense Refill   acetaminophen (TYLENOL) 325 MG tablet Take 650 mg by mouth every 6 (six) hours as needed for mild pain.     ALPRAZolam (XANAX) 0.5 MG tablet Take 1 tablet (0.5 mg total) by mouth 3 (three) times daily as needed for anxiety. 60 tablet 0   ascorbic acid (VITAMIN C) 500 MG tablet Take 500 mg by mouth daily.     b complex vitamins capsule Take 1 capsule by mouth daily.     Cholecalciferol (VITAMIN D-3 PO) Take 2,000 Units by mouth daily.     Ferrous Sulfate (IRON HIGH-POTENCY) 142 (45 Fe) MG TBCR Take 142 mg by mouth daily.     levonorgestrel (MIRENA, 52 MG,) 20 MCG/DAY IUD 1 each by Intrauterine route once.     methylPREDNISolone (MEDROL) 4 MG tablet Take 4 mg by mouth daily.     metoCLOPramide (REGLAN) 5 MG tablet Take 1 tablet (5 mg total) by mouth every 6 (six) hours as needed for nausea. 30 tablet 1   ondansetron (ZOFRAN) 8 MG tablet Take 1 tablet (8 mg total) by mouth every 8 (eight) hours as needed for nausea or vomiting. 20 tablet 0   ondansetron (ZOFRAN-ODT) 8 MG disintegrating tablet Take 1 tablet (8 mg total) by mouth every 8 (eight) hours as needed for nausea or vomiting. 20 tablet 1   Ondansetron 4 MG FILM Take 4-8 mg by mouth every 6 (six) hours as needed. 15 each 0   pantoprazole (PROTONIX) 40 MG tablet Take 1 tablet (40 mg total) by mouth daily. 30 tablet 3    polyethylene glycol (MIRALAX / GLYCOLAX) 17 g packet Take 17 g by mouth daily as needed for moderate constipation.     repotrectinib (AUGTYRO) 40 MG capsule Take 4 capsules (160 mg total) by mouth 2 (two) times daily. 240 capsule 2   XARELTO 20 MG TABS tablet TAKE 1 TABLET(20 MG) BY MOUTH DAILY WITH SUPPER 90 tablet 1   Zinc 22.5 MG TABS Take 22.5 mg by mouth daily.     aspirin EC 81 MG tablet Take 1 tablet (81 mg total) by mouth daily. Swallow whole. 30 tablet 12   clopidogrel (PLAVIX) 75 MG tablet Take 1 tablet (75 mg total) by mouth daily. 30 tablet  11   predniSONE (DELTASONE) 50 MG tablet Take 1 tablet (50 mg total) by mouth as directed. Take one tablet 13 hours , 1 tablet 7 hours and 1 tablet 1 hour prior to CT scan with IV contrast.  Take Benadryl 50 mg po 1 hour before CT scan (Patient not taking: Reported on 11/14/2023) 3 tablet 5   No current facility-administered medications for this visit.    Physical Examination  Vitals:   11/14/23 1310  BP: 135/80  Pulse: 92  Resp: 16  Temp: 98.3 F (36.8 C)  TempSrc: Temporal  SpO2: 100%  Weight: 170 lb 8 oz (77.3 kg)  Height: 5\' 6"  (1.676 m)    Body mass index is 27.52 kg/m.  General:  Alert and oriented, no acute distress HEENT: Normal Neck: No bruit or JVD Pulmonary: Clear to auscultation bilaterally Cardiac: Regular Rate and Rhythm without murmur Abdomen: Soft, non-tender, non-distended, no mass, no scars Skin: No rash Extremity Pulses:  2+ radial, brachial, femoral, dorsalis pedis, posterior tibial pulses bilaterally Musculoskeletal: No deformity or edema  Neurologic: Upper and lower extremity motor 5/5 and symmetric  DATA:     ABI Findings:  +---------+------------------+-----+--------+--------+  Right   Rt Pressure (mmHg)IndexWaveformComment   +---------+------------------+-----+--------+--------+  Brachial 124                                      +---------+------------------+-----+--------+--------+   PTA     109               0.84 biphasic          +---------+------------------+-----+--------+--------+  DP      101               0.78 biphasic          +---------+------------------+-----+--------+--------+  Great Toe76                0.58                   +---------+------------------+-----+--------+--------+   +---------+------------------+-----+----------+-------+  Left    Lt Pressure (mmHg)IndexWaveform  Comment  +---------+------------------+-----+----------+-------+  Brachial 130                                       +---------+------------------+-----+----------+-------+  PTA     103               0.79 triphasic          +---------+------------------+-----+----------+-------+  DP      85                0.65 monophasic         +---------+------------------+-----+----------+-------+  Great Toe88                0.68                    +---------+------------------+-----+----------+-------+   +-------+-----------+-----------+------------+------------+  ABI/TBIToday's ABIToday's TBIPrevious ABIPrevious TBI  +-------+-----------+-----------+------------+------------+  Right 0.84       0.58       0.80        0.48          +-------+-----------+-----------+------------+------------+  Left  0.79       0.68       0.76        0.55          +-------+-----------+-----------+------------+------------+  Bilateral ABIs appear essentially unchanged compared to prior study on  07/11/23.    Summary:  Right: Resting right ankle-brachial index indicates mild right lower  extremity arterial disease. The right toe-brachial index is abnormal.   Left: Resting left ankle-brachial index indicates moderate left lower  extremity arterial disease. The left toe-brachial index is abnormal.   +-----------+--------+-----+---------------+---------+--------+  RIGHT     PSV cm/sRatioStenosis       Waveform Comments   +-----------+--------+-----+---------------+---------+--------+  CFA Mid    230          50-74% stenosistriphasic          +-----------+--------+-----+---------------+---------+--------+  DFA       132                         triphasic          +-----------+--------+-----+---------------+---------+--------+  SFA Prox   140                         triphasic          +-----------+--------+-----+---------------+---------+--------+  SFA Mid    138                         triphasic          +-----------+--------+-----+---------------+---------+--------+  SFA Distal 91                          triphasic          +-----------+--------+-----+---------------+---------+--------+  POP Prox   69                          triphasic          +-----------+--------+-----+---------------+---------+--------+  TP Trunk   128                         triphasic          +-----------+--------+-----+---------------+---------+--------+  ATA Distal 22                          biphasic           +-----------+--------+-----+---------------+---------+--------+  PTA Distal 34                          triphasic          +-----------+--------+-----+---------------+---------+--------+  PERO Distal17                          biphasic           +-----------+--------+-----+---------------+---------+--------+      Summary:  Right: 50-74% stenosis noted in the common femoral artery.    ASSESSMENT/PLAN:  I was able to palpate B femorals and B DP pulses on exam today.  She has minimal PT tendon tenderness to palpation on the right ankle with minimal edema.  No erythema, skin warm to touch.    The arterial duplex demonstrated moderate CFA stenosis 50% with triphasic/biphasic wave forms without stenosis/occlusions.  She did not have right LE intervention.  Pre operative Vascular Imaging:11/27/22:   Arterial duplex reviewed.  Left profunda occlusion. Popliteal  occlusions noted bilaterally. Tibial occlusions bilaterally. Toe PPG noted on the left (more symptomatic side). Absent toe PPG on the right (less symptomatic  side)  She is s/p Left LE mechanical thrombectomy of left profunda femoris, Popliteal artery, and   laser atherectomy and angioplasty (3 x ) of left anterior tibial artery.  A Perclose device was used to close the arteriotomy. Hemostasis was excellent upon completion. She will continue Xarelto.  Dr. Lenell Antu has agreed with discontinuing ASA and Plavix prior to this vist per the patient. Mosetta Pigeon PA-C [facility] [place] Office: [phone]  MD on call Hetty Blend

## 2023-11-18 ENCOUNTER — Inpatient Hospital Stay: Payer: BC Managed Care – PPO

## 2023-11-25 ENCOUNTER — Ambulatory Visit: Payer: BC Managed Care – PPO | Admitting: Internal Medicine

## 2023-11-28 DIAGNOSIS — M76821 Posterior tibial tendinitis, right leg: Secondary | ICD-10-CM | POA: Diagnosis not present

## 2023-11-28 DIAGNOSIS — M25571 Pain in right ankle and joints of right foot: Secondary | ICD-10-CM | POA: Diagnosis not present

## 2023-12-01 ENCOUNTER — Other Ambulatory Visit: Payer: Self-pay | Admitting: *Deleted

## 2023-12-01 ENCOUNTER — Telehealth: Payer: Self-pay | Admitting: Physician Assistant

## 2023-12-01 DIAGNOSIS — C3492 Malignant neoplasm of unspecified part of left bronchus or lung: Secondary | ICD-10-CM

## 2023-12-01 DIAGNOSIS — C3490 Malignant neoplasm of unspecified part of unspecified bronchus or lung: Secondary | ICD-10-CM

## 2023-12-01 NOTE — Telephone Encounter (Signed)
 Scheduled appointment per patient request. Patient is aware of the changes made.

## 2023-12-02 ENCOUNTER — Other Ambulatory Visit: Payer: Self-pay | Admitting: Internal Medicine

## 2023-12-02 ENCOUNTER — Inpatient Hospital Stay: Payer: BC Managed Care – PPO | Attending: Internal Medicine

## 2023-12-02 ENCOUNTER — Ambulatory Visit (HOSPITAL_COMMUNITY)
Admission: RE | Admit: 2023-12-02 | Discharge: 2023-12-02 | Disposition: A | Discharge status: Home / Self Care | Payer: BC Managed Care – PPO | Source: Ambulatory Visit | Attending: Internal Medicine | Admitting: Internal Medicine

## 2023-12-02 DIAGNOSIS — C3490 Malignant neoplasm of unspecified part of unspecified bronchus or lung: Secondary | ICD-10-CM

## 2023-12-02 DIAGNOSIS — J90 Pleural effusion, not elsewhere classified: Secondary | ICD-10-CM | POA: Diagnosis not present

## 2023-12-02 DIAGNOSIS — K820 Obstruction of gallbladder: Secondary | ICD-10-CM | POA: Diagnosis not present

## 2023-12-02 DIAGNOSIS — C7951 Secondary malignant neoplasm of bone: Secondary | ICD-10-CM | POA: Diagnosis not present

## 2023-12-02 DIAGNOSIS — D649 Anemia, unspecified: Secondary | ICD-10-CM | POA: Insufficient documentation

## 2023-12-02 DIAGNOSIS — C3492 Malignant neoplasm of unspecified part of left bronchus or lung: Secondary | ICD-10-CM | POA: Diagnosis not present

## 2023-12-02 DIAGNOSIS — N2889 Other specified disorders of kidney and ureter: Secondary | ICD-10-CM | POA: Diagnosis not present

## 2023-12-02 LAB — CMP (CANCER CENTER ONLY)
ALT: 13 U/L (ref 0–44)
AST: 16 U/L (ref 15–41)
Albumin: 4.8 g/dL (ref 3.5–5.0)
Alkaline Phosphatase: 123 U/L (ref 38–126)
Anion gap: 8 (ref 5–15)
BUN: 13 mg/dL (ref 6–20)
CO2: 27 mmol/L (ref 22–32)
Calcium: 9.6 mg/dL (ref 8.9–10.3)
Chloride: 103 mmol/L (ref 98–111)
Creatinine: 0.72 mg/dL (ref 0.44–1.00)
GFR, Estimated: >60 mL/min (ref >60)
Glucose, Bld: 140 mg/dL — ABNORMAL HIGH (ref 70–99)
Potassium: 3.8 mmol/L (ref 3.5–5.1)
Sodium: 138 mmol/L (ref 135–145)
Total Bilirubin: 0.3 mg/dL (ref 0.0–1.2)
Total Protein: 8.2 g/dL — ABNORMAL HIGH (ref 6.5–8.1)

## 2023-12-02 LAB — CBC WITH DIFFERENTIAL (CANCER CENTER ONLY)
Abs Immature Granulocytes: 0.02 10*3/uL (ref 0.00–0.07)
Basophils Absolute: 0 10*3/uL (ref 0.0–0.1)
Basophils Relative: 0 %
Eosinophils Absolute: 0 10*3/uL (ref 0.0–0.5)
Eosinophils Relative: 0 %
HCT: 33.8 % — ABNORMAL LOW (ref 36.0–46.0)
Hemoglobin: 11 g/dL — ABNORMAL LOW (ref 12.0–15.0)
Immature Granulocytes: 0 %
Lymphocytes Relative: 7 %
Lymphs Abs: 0.6 10*3/uL — ABNORMAL LOW (ref 0.7–4.0)
MCH: 30.2 pg (ref 26.0–34.0)
MCHC: 32.5 g/dL (ref 30.0–36.0)
MCV: 92.9 fL (ref 80.0–100.0)
Monocytes Absolute: 0.1 10*3/uL (ref 0.1–1.0)
Monocytes Relative: 1 %
Neutro Abs: 8 10*3/uL — ABNORMAL HIGH (ref 1.7–7.7)
Neutrophils Relative %: 92 %
Platelet Count: 641 10*3/uL — ABNORMAL HIGH (ref 150–400)
RBC: 3.64 MIL/uL — ABNORMAL LOW (ref 3.87–5.11)
RDW: 12.4 % (ref 11.5–15.5)
WBC Count: 8.7 10*3/uL (ref 4.0–10.5)
nRBC: 0 % (ref 0.0–0.2)

## 2023-12-02 MED ORDER — IOHEXOL 300 MG/ML  SOLN
100.0000 mL | Freq: Once | INTRAMUSCULAR | Status: AC | PRN
  Administered 2023-12-02: 100 mL via INTRAVENOUS

## 2023-12-09 ENCOUNTER — Telehealth: Payer: Self-pay | Admitting: Internal Medicine

## 2023-12-09 ENCOUNTER — Ambulatory Visit: Payer: BC Managed Care – PPO | Admitting: Physician Assistant

## 2023-12-09 NOTE — Progress Notes (Deleted)
 [facility] OFFICE PROGRESS NOTE  Patient, No Pcp Per No address on file  DIAGNOSIS:  Stage IVB (T4, N3, M1b) non-small cell lung cancer, adenocarcinoma presented with large left diaphragmatic surface mass in addition to left hilar, infrahilar, AP window, right and left paratracheal, subcarinal as well as left internal mammary lymphadenopathy in addition to right gastric lymph node and solitary Small right frontal calvarial osseous metastatic disease with large malignant left pleural effusion diagnosed in January 2024.     Molecular studies by [employer] as well as [employer] blood test showed:    EZR-ROS1 fusion approved by FDA Crizotinib, Entrectinib, Repotrectinib   approved in other indication Ceritinib, Lorlatinib Yes      3.6%   PD-L1 expression by [employer] that was 98%.  PRIOR THERAPY: Status post left Pleurx catheter placement for drainage of recurrent left pleural effusion. This was removed on December 17, 2022.   CURRENT THERAPY: Repotrectinib (Augtyro) 160 mg p.o. daily for the first 2 weeks and then 160 mg p.o. twice daily, first dose November 27, 2022.  She is status post 12 months of treatment   INTERVAL HISTORY: Deionna Lengel 38 y.o. female returns to clinic today for follow-up visit.  The patient was last seen in the clinic on 10/29/2023 by Dr. Arbutus Ped.  The patient is currently undergoing oral targeted treatment with Repotrectinib.  She has been on this for approximately 1 year.  At her last appointment with Dr. Arbutus Ped she was endorsing some tension headaches, fatigue, drop in her hemoglobin level to 10.5 despite taking daily iron supplementation, and dyspnea on exertion.  She also requested referral to nutritionist.  She had an appointment on 11/04/2023 but ***ointment was missed due to***. This has been rescheduled for 12/12/23  She denies any fever, chills, or night sweats.  Appetite and weight loss?  Shortness of breath?  Denies any cough, chest pain,  or hemoptysis.  Denies any nausea, vomiting, diarrhea, or constipation.  Headaches?  Vision changes?  Due to anxiety about her condition, the patient requested monthly CT scans as opposed to every 2 months.  Therefore she recently had a repeat CT scan performed.  She is here today for evaluation and to review her scan results.   MEDICAL HISTORY: Past Medical History:  Diagnosis Date   Anxiety    [employer] 11/21/2020   Cancer (HCC)     ALLERGIES:  is allergic to hydrocodone, tramadol, and iodinated contrast media.  MEDICATIONS:  Current Outpatient Medications  Medication Sig Dispense Refill   acetaminophen (TYLENOL) 325 MG tablet Take 650 mg by mouth every 6 (six) hours as needed for mild pain.     ALPRAZolam (XANAX) 0.5 MG tablet Take 1 tablet (0.5 mg total) by mouth 3 (three) times daily as needed for anxiety. 60 tablet 0   ascorbic acid (VITAMIN C) 500 MG tablet Take 500 mg by mouth daily.     aspirin EC 81 MG tablet Take 1 tablet (81 mg total) by mouth daily. Swallow whole. 30 tablet 12   b complex vitamins capsule Take 1 capsule by mouth daily.     Cholecalciferol (VITAMIN D-3 PO) Take 2,000 Units by mouth daily.     clopidogrel (PLAVIX) 75 MG tablet Take 1 tablet (75 mg total) by mouth daily. 30 tablet 11   Ferrous Sulfate (IRON HIGH-POTENCY) 142 (45 Fe) MG TBCR Take 142 mg by mouth daily.     levonorgestrel (MIRENA, 52 MG,) 20 MCG/DAY IUD 1 each by Intrauterine route once.  metoCLOPramide (REGLAN) 5 MG tablet Take 1 tablet (5 mg total) by mouth every 6 (six) hours as needed for nausea. 30 tablet 1   ondansetron (ZOFRAN) 8 MG tablet Take 1 tablet (8 mg total) by mouth every 8 (eight) hours as needed for nausea or vomiting. 20 tablet 0   ondansetron (ZOFRAN-ODT) 8 MG disintegrating tablet Take 1 tablet (8 mg total) by mouth every 8 (eight) hours as needed for nausea or vomiting. 20 tablet 1   Ondansetron 4 MG FILM Take 4-8 mg by mouth every 6 (six) hours as needed. 15 each 0    pantoprazole (PROTONIX) 40 MG tablet Take 1 tablet (40 mg total) by mouth daily. 30 tablet 3   polyethylene glycol (MIRALAX / GLYCOLAX) 17 g packet Take 17 g by mouth daily as needed for moderate constipation.     predniSONE (DELTASONE) 50 MG tablet Take 1 tablet (50 mg total) by mouth as directed. Take one tablet 13 hours , 1 tablet 7 hours and 1 tablet 1 hour prior to CT scan with IV contrast.  Take Benadryl 50 mg po 1 hour before CT scan (Patient not taking: Reported on 11/14/2023) 3 tablet 5   repotrectinib (AUGTYRO) 40 MG capsule Take 4 capsules (160 mg total) by mouth 2 (two) times daily. 240 capsule 2   XARELTO 20 MG TABS tablet TAKE 1 TABLET(20 MG) BY MOUTH DAILY WITH SUPPER 90 tablet 1   Zinc 22.5 MG TABS Take 22.5 mg by mouth daily.     No current facility-administered medications for this visit.    SURGICAL HISTORY:  Past Surgical History:  Procedure Laterality Date   ABDOMINAL AORTOGRAM W/LOWER EXTREMITY N/A 11/27/2022   Procedure: ABDOMINAL AORTOGRAM W/LOWER EXTREMITY;  Surgeon: Leonie Douglas, MD;  Location: [facility]ASIVE [facility];  Service: Cardiovascular;  Laterality: N/A;   IR PERC PLEURAL DRAIN W/INDWELL CATH W/IMG GUIDE  11/09/2022   PERIPHERAL VASCULAR ATHERECTOMY  11/27/2022   Procedure: PERIPHERAL VASCULAR ATHERECTOMY;  Surgeon: Leonie Douglas, MD;  Location: [facility];  Service: Cardiovascular;;   PERIPHERAL VASCULAR THROMBECTOMY  11/27/2022   Procedure: PERIPHERAL VASCULAR THROMBECTOMY;  Surgeon: Leonie Douglas, MD;  Location: [facility];  Service: Cardiovascular;;   REMOVAL OF PLEURAL DRAINAGE CATHETER N/A 12/17/2022   Procedure: MINOR REMOVAL OF PLEURAL DRAINAGE CATHETER;  Surgeon: Josephine Igo, DO;  Location: [facility] ENDOSCOPY;  Service: Cardiopulmonary;  Laterality: N/A;   TEE WITHOUT CARDIOVERSION N/A 06/10/2023   Procedure: TRANSESOPHAGEAL ECHOCARDIOGRAM;  Surgeon: Chilton Si, MD;  Location: [facility] INVASIVE CV LAB;  Service: Cardiovascular;   Laterality: N/A;   WISDOM TOOTH EXTRACTION      REVIEW OF SYSTEMS:   Review of Systems  Constitutional: Negative for appetite change, chills, fatigue, fever and unexpected weight change.  HENT:   Negative for mouth sores, nosebleeds, sore throat and trouble swallowing.   Eyes: Negative for eye problems and icterus.  Respiratory: Negative for cough, hemoptysis, shortness of breath and wheezing.   Cardiovascular: Negative for chest pain and leg swelling.  Gastrointestinal: Negative for abdominal pain, constipation, diarrhea, nausea and vomiting.  Genitourinary: Negative for bladder incontinence, difficulty urinating, dysuria, frequency and hematuria.   Musculoskeletal: Negative for back pain, gait problem, neck pain and neck stiffness.  Skin: Negative for itching and rash.  Neurological: Negative for dizziness, extremity weakness, gait problem, headaches, light-headedness and seizures.  Hematological: Negative for adenopathy. Does not bruise/bleed easily.  Psychiatric/Behavioral: Negative for confusion, depression and sleep disturbance. The patient is not nervous/anxious.  PHYSICAL EXAMINATION:  There were no vitals taken for this visit.  ECOG PERFORMANCE STATUS: {CHL ONC ECOG [id number]  Physical Exam  Constitutional: Oriented to person, place, and time and well-developed, well-nourished, and in no distress. No distress.  HENT:  Head: Normocephalic and atraumatic.  Mouth/Throat: Oropharynx is clear and moist. No oropharyngeal exudate.  Eyes: Conjunctivae are normal. Right eye exhibits no discharge. Left eye exhibits no discharge. No scleral icterus.  Neck: Normal range of motion. Neck supple.  Cardiovascular: Normal rate, regular rhythm, normal heart sounds and intact distal pulses.   Pulmonary/Chest: Effort normal and breath sounds normal. No respiratory distress. No wheezes. No rales.  Abdominal: Soft. Bowel sounds are normal. Exhibits no distension and no mass. There is no  tenderness.  Musculoskeletal: Normal range of motion. Exhibits no edema.  Lymphadenopathy:    No cervical adenopathy.  Neurological: Alert and oriented to person, place, and time. Exhibits normal muscle tone. Gait normal. Coordination normal.  Skin: Skin is warm and dry. No rash noted. Not diaphoretic. No erythema. No pallor.  Psychiatric: Mood, memory and judgment normal.  Vitals reviewed.  LABORATORY DATA: Lab Results  Component Value Date   WBC 8.7 12/02/2023   HGB 11.0 (L) 12/02/2023   HCT 33.8 (L) 12/02/2023   MCV 92.9 12/02/2023   PLT 641 (H) 12/02/2023      Chemistry      Component Value Date/Time   NA 138 12/02/2023 1557   NA 142 05/26/2023 0938   K 3.8 12/02/2023 1557   CL 103 12/02/2023 1557   CO2 27 12/02/2023 1557   BUN 13 12/02/2023 1557   BUN 17 05/26/2023 0938   CREATININE 0.72 12/02/2023 1557      Component Value Date/Time   CALCIUM 9.6 12/02/2023 1557   ALKPHOS 123 12/02/2023 1557   AST 16 12/02/2023 1557   ALT 13 12/02/2023 1557   BILITOT 0.3 12/02/2023 1557       RADIOGRAPHIC STUDIES:  CT CHEST ABDOMEN PELVIS W CONTRAST Result Date: 12/03/2023 CLINICAL DATA:  Non-small cell lung cancer restaging * Tracking Code: BO * EXAM: CT CHEST, ABDOMEN, AND PELVIS WITH CONTRAST TECHNIQUE: Multidetector CT imaging of the chest, abdomen and pelvis was performed following the standard protocol during bolus administration of intravenous contrast. RADIATION DOSE REDUCTION: This exam was performed according to the departmental dose-optimization program which includes automated exposure control, adjustment of the mA and/or kV according to patient size and/or use of iterative reconstruction technique. CONTRAST:  OMNIPAQUE IOHEXOL 300 MG/ML  SOLN COMPARISON:  08/26/2023 FINDINGS: CT CHEST FINDINGS Cardiovascular: No significant vascular findings. Normal heart size. No pericardial effusion. Mediastinum/Nodes: No enlarged mediastinal, hilar, or axillary lymph nodes.  Unchanged thymic remnant or rebound in the anterior mediastinum. Thyroid gland, trachea, and esophagus demonstrate no significant findings. Lungs/Pleura: Unchanged post treatment appearance of the left lung base, again with irregular residua of an infrahilar left lower lobe mass as well as irregular scarring and interlobular septal thickening throughout the left lung base (series 4, image 116). Unchanged, trace, loculated left pleural effusion. No right pleural effusion. Musculoskeletal: No chest wall abnormality. No acute osseous findings. CT ABDOMEN PELVIS FINDINGS Hepatobiliary: No solid liver abnormality is seen. Contracted gallbladder no gallstones, gallbladder wall thickening, or biliary dilatation. Pancreas: Unremarkable. No pancreatic ductal dilatation or surrounding inflammatory changes. Spleen: Normal in size without significant abnormality. Adrenals/Urinary Tract: Adrenal glands are unremarkable. Unchanged mild bilateral caliectasis (series 2, image 67, 65). Kidneys are otherwise normal, without renal calculi, solid lesion, or hydronephrosis. Bladder  is unremarkable. Stomach/Bowel: Stomach is within normal limits. Appendix appears normal. No evidence of bowel wall thickening, distention, or inflammatory changes. Moderate burden of stool throughout the colon and rectum. Vascular/Lymphatic: No significant vascular findings are present. No enlarged abdominal or pelvic lymph nodes. Reproductive: IUD in the fundal endometrial cavity. Other: Unchanged small umbilical hernia.  No ascites. Musculoskeletal: No acute osseous findings. IMPRESSION: 1. Unchanged post treatment appearance of the left lung base, again with irregular residua of an infrahilar left lower lobe mass as well as irregular scarring and interlobular septal thickening throughout the left lung base. 2. Unchanged, trace, loculated left pleural effusion. 3. No evidence of lymphadenopathy or metastatic disease in the chest, abdomen, or pelvis.  Electronically Signed   By: Jearld Lesch M.D.   On: 12/03/2023 14:45   VAS [place] ABI WITH/WO TBI Result Date: 11/14/2023  LOWER EXTREMITY DOPPLER STUDY Patient Name:  KATRINIA STRAKER  Date of Exam:   11/14/2023 Medical Rec #: [id number]     Accession #:    [id number] Date of Birth: 01-30-1986      Patient Gender: F Patient Age:   89 years Exam Location:  [facility] Procedure:      VAS [place] ABI WITH/WO TBI Referring Phys: Heath Lark --------------------------------------------------------------------------------  Indications: Rest pain. worsening right leg pain  Vascular Interventions: 11/27/22: mechanical thrombectomy of left profunda femoris                         artery                         mechanical.thrombectomy of left popliteal artery.                         Left ATA laser atherectomy and angioplasty. Performing Technologist: [place] Ponder RVS  Examination Guidelines: A complete evaluation includes at minimum, Doppler waveform signals and systolic blood pressure reading at the level of bilateral brachial, anterior tibial, and posterior tibial arteries, when vessel segments are accessible. Bilateral testing is considered an integral part of a complete examination. Photoelectric Plethysmograph (PPG) waveforms and toe systolic pressure readings are included as required and additional duplex testing as needed. Limited examinations for reoccurring indications may be performed as noted.  ABI Findings: +---------+------------------+-----+--------+--------+ Right    Rt Pressure (mmHg)IndexWaveformComment  +---------+------------------+-----+--------+--------+ Brachial 124                                     +---------+------------------+-----+--------+--------+ PTA      109               0.84 biphasic         +---------+------------------+-----+--------+--------+ DP       101               0.78 biphasic         +---------+------------------+-----+--------+--------+ Great  Toe76                0.58                  +---------+------------------+-----+--------+--------+ +---------+------------------+-----+----------+-------+ Left     Lt Pressure (mmHg)IndexWaveform  Comment +---------+------------------+-----+----------+-------+ Brachial 130                                      +---------+------------------+-----+----------+-------+  PTA      103               0.79 triphasic         +---------+------------------+-----+----------+-------+ DP       85                0.65 monophasic        +---------+------------------+-----+----------+-------+ Great Toe88                0.68                   +---------+------------------+-----+----------+-------+ +-------+-----------+-----------+------------+------------+ ABI/TBIToday's ABIToday's TBIPrevious ABIPrevious TBI +-------+-----------+-----------+------------+------------+ Right  0.84       0.58       0.80        0.48         +-------+-----------+-----------+------------+------------+ Left   0.79       0.68       0.76        0.55         +-------+-----------+-----------+------------+------------+ Bilateral ABIs appear essentially unchanged compared to prior study on 07/11/23.  Summary: Right: Resting right ankle-brachial index indicates mild right lower extremity arterial disease. The right toe-brachial index is abnormal. Left: Resting left ankle-brachial index indicates moderate left lower extremity arterial disease. The left toe-brachial index is abnormal. *See table(s) above for measurements and observations.  Electronically signed by Carolynn Sayers on 11/14/2023 at 1:53:21 PM.    Final    VAS [place] LOWER EXTREMITY ARTERIAL DUPLEX Result Date: 11/14/2023 LOWER EXTREMITY ARTERIAL DUPLEX STUDY Patient Name:  SERENNA DEROY  Date of Exam:   11/14/2023 Medical Rec #: [id number]     Accession #:    [id number] Date of Birth: 1986/03/14      Patient Gender: F Patient Age:   52 years Exam Location:  [facility] Procedure:      VAS [place] LOWER EXTREMITY ARTERIAL DUPLEX Referring Phys: Heath Lark --------------------------------------------------------------------------------  Indications: Rest pain.  Vascular Interventions: 11/27/22: mechanical thrombectomy of left profunda femoris                         artery                         mechanical.thrombectomy of left popliteal artery.                         Left ATA laser atherectomy and angioplasty. Current ABI:            R 0.84 L 0.79 Performing Technologist: [place] Ponder RVS  Examination Guidelines: A complete evaluation includes B-mode imaging, spectral Doppler, color Doppler, and power Doppler as needed of all accessible portions of each vessel. Bilateral testing is considered an integral part of a complete examination. Limited examinations for reoccurring indications may be performed as noted.  +-----------+--------+-----+---------------+---------+--------+ RIGHT      PSV cm/sRatioStenosis       Waveform Comments +-----------+--------+-----+---------------+---------+--------+ CFA Mid    230          50-74% stenosistriphasic         +-----------+--------+-----+---------------+---------+--------+ DFA        132                         triphasic         +-----------+--------+-----+---------------+---------+--------+ SFA Prox   140  triphasic         +-----------+--------+-----+---------------+---------+--------+ SFA Mid    138                         triphasic         +-----------+--------+-----+---------------+---------+--------+ SFA Distal 91                          triphasic         +-----------+--------+-----+---------------+---------+--------+ POP Prox   69                          triphasic         +-----------+--------+-----+---------------+---------+--------+ TP Trunk   128                         triphasic          +-----------+--------+-----+---------------+---------+--------+ ATA Distal 22                          biphasic          +-----------+--------+-----+---------------+---------+--------+ PTA Distal 34                          triphasic         +-----------+--------+-----+---------------+---------+--------+ PERO Distal17                          biphasic          +-----------+--------+-----+---------------+---------+--------+   Summary: Right: 50-74% stenosis noted in the common femoral artery.  See table(s) above for measurements and observations. Electronically signed by Carolynn Sayers on 11/14/2023 at 1:53:02 PM.    Final      ASSESSMENT/PLAN:  This is a very pleasant 38 year old Caucasian female diagnosed with stage IVb (T4, and 3, M1B) non-small cell lung cancer, adenocarcinoma.  She presented with a large left diaphragmatic surface mass in addition to left hilar, infrahilar, AP window, right and left paratracheal, subcarinal, as well as internal mammary lymphadenopathy.  She also had a right gastric lymph node and solitary small right frontal calvarial osseous metastatic disease in addition to a large malignant left pleural effusion.  She was diagnosed in January 2024.  Her molecular studies show that she is positive for ROS1 fusion and has a PD-L1 expression 98%.  She is currently undergoing targeted treatment with repotrectinib started November 27, 2022.  Status post 12 months of treatment and has been tolerating it fairly well.   Patient recently had a repeat CT scan performed.  The patient was seen with Dr. Arbutus Ped today.  Dr. Arbutus Ped personally and independently reviewed the scan and discussed the results with the patient today.  The scan showed evidence of disease progression.  Dr. Arbutus Ped recommends that she continue on observation with repeat blood work in 6 to 8 weeks.  Dr. Arbutus Ped recommends that she continue taking repotrectinib daily.   ***Dietitian  12/12/23  ***Anemia  ***F/U  The patient was advised to call immediately if she has any concerning symptoms in the interval. The patient voices understanding of current disease status and treatment options and is in agreement with the current care plan. All questions were answered. The patient knows to call the clinic with any problems, questions or concerns. We can certainly see the patient much sooner if necessary  No orders of the defined types were placed in this encounter.    I spent {CHL ONC TIME VISIT - ZOXWR:[id number]} counseling the patient face to face. The total time spent in the appointment was {CHL ONC TIME VISIT - BJYNW:[id number]}.  Kaisyn Reinhold L Knoxx Boeding, PA-C 12/09/23

## 2023-12-10 DIAGNOSIS — M25571 Pain in right ankle and joints of right foot: Secondary | ICD-10-CM | POA: Diagnosis not present

## 2023-12-11 ENCOUNTER — Ambulatory Visit: Payer: BC Managed Care – PPO | Admitting: Physician Assistant

## 2023-12-11 ENCOUNTER — Inpatient Hospital Stay (HOSPITAL_BASED_OUTPATIENT_CLINIC_OR_DEPARTMENT_OTHER): Payer: BC Managed Care – PPO | Admitting: Internal Medicine

## 2023-12-11 ENCOUNTER — Telehealth: Payer: Self-pay | Admitting: Internal Medicine

## 2023-12-11 VITALS — BP 120/69 | HR 91 | Temp 98.4°F | Resp 16 | Ht 66.0 in | Wt 175.2 lb

## 2023-12-11 DIAGNOSIS — C7951 Secondary malignant neoplasm of bone: Secondary | ICD-10-CM | POA: Diagnosis not present

## 2023-12-11 DIAGNOSIS — C3492 Malignant neoplasm of unspecified part of left bronchus or lung: Secondary | ICD-10-CM

## 2023-12-11 DIAGNOSIS — D649 Anemia, unspecified: Secondary | ICD-10-CM | POA: Diagnosis not present

## 2023-12-11 NOTE — Telephone Encounter (Signed)
Scheduled appointments per 2/20 LOS notes. Patient is aware of the appointments scheduled.

## 2023-12-11 NOTE — Progress Notes (Signed)
[facility] [phone]   [phone]  OFFICE PROGRESS NOTE  Patient, No Pcp Per No address on file  DIAGNOSIS: Stage IVB (T4, N3, M1b) non-small cell lung cancer, adenocarcinoma presented with large left diaphragmatic surface mass in addition to left hilar, infrahilar, AP window, right and left paratracheal, subcarinal as well as left internal mammary lymphadenopathy in addition to right gastric lymph node and solitary Small right frontal calvarial osseous metastatic disease with large malignant left pleural effusion diagnosed in January 2024.    Molecular studies by [employer] as well as [employer] blood test showed:   EZR-ROS1 fusion approved by FDA Crizotinib, Entrectinib, Repotrectinib   approved in other indication Ceritinib, Lorlatinib Yes      3.6%   PD-L1 expression by [employer] that was 98%.   PRIOR THERAPY: Status post left Pleurx catheter placement for drainage of recurrent left pleural effusion.  This was removed on December 17, 2022.  CURRENT THERAPY: Repotrectinib (Augtyro) 160 mg p.o. daily for the first 2 weeks and then 160 mg p.o. twice daily, first dose November 27, 2022.  She is status post 12.5 months of treatment  INTERVAL HISTORY: Melissa Pineda 38 y.o. female returns to the clinic today for follow-up visit. Discussed the use of AI scribe software for clinical note transcription with the patient, who gave verbal consent to proceed.  History of Present Illness   Melissa Pineda is a 38 year old female with stage four non-small cell lung cancer who presents with right foot pain and swelling.  She experiences sudden onset of right foot pain and swelling. Initially evaluated at [facility] urgent care, she was diagnosed with posterior tendonitis and treated with an ankle brace and metoprednisolone. The steroid treatment was ineffective, possibly due to interaction with her cancer medication. She continues to have significant  swelling and difficulty walking, impacting her mobility. An x-ray showed no abnormalities, and she was advised to immobilize the foot in a boot. Physical therapy noted the condition was 'edging out of normal,' and an MRI was recommended, which is pending.  She was diagnosed with stage four non-small cell lung cancer, adenocarcinoma, in January 2024, with a positive ROS1 fusion. She has been on repotrectinib 160 mg twice daily since November 27, 2022. She is experiencing mild shortness of breath but is unsure of the cause. Recent scans indicate the cancer is under control with no concerning findings.  She engages in physical activity, including walking on a treadmill, which she suspects may have contributed to her foot issues.  She has a history of mild anemia and noted a high white blood cell count in recent lab work, which was taken before starting steroids.       MEDICAL HISTORY: Past Medical History:  Diagnosis Date   Anxiety    [employer] 11/21/2020   Cancer (HCC)     ALLERGIES:  is allergic to hydrocodone, tramadol, and iodinated contrast media.  MEDICATIONS:  Current Outpatient Medications  Medication Sig Dispense Refill   acetaminophen (TYLENOL) 325 MG tablet Take 650 mg by mouth every 6 (six) hours as needed for mild pain.     ALPRAZolam (XANAX) 0.5 MG tablet Take 1 tablet (0.5 mg total) by mouth 3 (three) times daily as needed for anxiety. 60 tablet 0   ascorbic acid (VITAMIN C) 500 MG tablet Take 500 mg by mouth daily.     aspirin EC 81 MG tablet Take 1 tablet (81 mg total) by mouth daily. Swallow  whole. 30 tablet 12   b complex vitamins capsule Take 1 capsule by mouth daily.     Cholecalciferol (VITAMIN D-3 PO) Take 2,000 Units by mouth daily.     clopidogrel (PLAVIX) 75 MG tablet Take 1 tablet (75 mg total) by mouth daily. 30 tablet 11   Ferrous Sulfate (IRON HIGH-POTENCY) 142 (45 Fe) MG TBCR Take 142 mg by mouth daily.     levonorgestrel (MIRENA, 52 MG,) 20 MCG/DAY IUD 1 each  by Intrauterine route once.     metoCLOPramide (REGLAN) 5 MG tablet Take 1 tablet (5 mg total) by mouth every 6 (six) hours as needed for nausea. 30 tablet 1   ondansetron (ZOFRAN) 8 MG tablet Take 1 tablet (8 mg total) by mouth every 8 (eight) hours as needed for nausea or vomiting. 20 tablet 0   ondansetron (ZOFRAN-ODT) 8 MG disintegrating tablet Take 1 tablet (8 mg total) by mouth every 8 (eight) hours as needed for nausea or vomiting. 20 tablet 1   Ondansetron 4 MG FILM Take 4-8 mg by mouth every 6 (six) hours as needed. 15 each 0   pantoprazole (PROTONIX) 40 MG tablet Take 1 tablet (40 mg total) by mouth daily. 30 tablet 3   polyethylene glycol (MIRALAX / GLYCOLAX) 17 g packet Take 17 g by mouth daily as needed for moderate constipation.     predniSONE (DELTASONE) 50 MG tablet Take 1 tablet (50 mg total) by mouth as directed. Take one tablet 13 hours , 1 tablet 7 hours and 1 tablet 1 hour prior to CT scan with IV contrast.  Take Benadryl 50 mg po 1 hour before CT scan (Patient not taking: Reported on 11/14/2023) 3 tablet 5   repotrectinib (AUGTYRO) 40 MG capsule Take 4 capsules (160 mg total) by mouth 2 (two) times daily. 240 capsule 2   XARELTO 20 MG TABS tablet TAKE 1 TABLET(20 MG) BY MOUTH DAILY WITH SUPPER 90 tablet 1   Zinc 22.5 MG TABS Take 22.5 mg by mouth daily.     No current facility-administered medications for this visit.    SURGICAL HISTORY:  Past Surgical History:  Procedure Laterality Date   ABDOMINAL AORTOGRAM W/LOWER EXTREMITY N/A 11/27/2022   Procedure: ABDOMINAL AORTOGRAM W/LOWER EXTREMITY;  Surgeon: Leonie Douglas, MD;  Location: [facility] INVASIVE CV [facility];  Service: Cardiovascular;  Laterality: N/A;   IR PERC PLEURAL DRAIN W/INDWELL CATH W/IMG GUIDE  11/09/2022   PERIPHERAL VASCULAR ATHERECTOMY  11/27/2022   Procedure: PERIPHERAL VASCULAR ATHERECTOMY;  Surgeon: Leonie Douglas, MD;  Location: [facility]AS[facility] CV [facility];  Service: Cardiovascular;;   PERIPHERAL VASCULAR THROMBECTOMY   11/27/2022   Procedure: PERIPHERAL VASCULAR THROMBECTOMY;  Surgeon: Leonie Douglas, MD;  Location: [facility];  Service: Cardiovascular;;   REMOVAL OF PLEURAL DRAINAGE CATHETER N/A 12/17/2022   Procedure: MINOR REMOVAL OF PLEURAL DRAINAGE CATHETER;  Surgeon: Josephine Igo, DO;  Location: [facility] ENDOSCOPY;  Service: Cardiopulmonary;  Laterality: N/A;   TEE WITHOUT CARDIOVERSION N/A 06/10/2023   Procedure: TRANSESOPHAGEAL ECHOCARDIOGRAM;  Surgeon: Chilton Si, MD;  Location: [facility] INVASIVE CV LAB;  Service: Cardiovascular;  Laterality: N/A;   WISDOM TOOTH EXTRACTION      REVIEW OF SYSTEMS:  Constitutional: negative Eyes: negative Ears, nose, mouth, throat, and face: negative Respiratory: negative Cardiovascular: negative Gastrointestinal: negative Genitourinary:negative Integument/breast: negative Hematologic/lymphatic: negative Musculoskeletal:positive for arthralgias Neurological: negative Behavioral/Psych: negative Endocrine: negative Allergic/Immunologic: negative   PHYSICAL EXAMINATION: General appearance: alert, cooperative, and no distress Head: Normocephalic, without obvious abnormality, atraumatic Neck: no  adenopathy, no JVD, supple, symmetrical, trachea midline, and thyroid not enlarged, symmetric, no tenderness/mass/nodules Lymph nodes: Cervical, supraclavicular, and axillary nodes normal. Resp: clear to auscultation bilaterally Back: symmetric, no curvature. ROM normal. No CVA tenderness. Cardio: regular rate and rhythm, S1, S2 normal, no murmur, click, rub or gallop GI: soft, non-tender; bowel sounds normal; no masses,  no organomegaly Extremities: extremities normal, atraumatic, no cyanosis or edema Neurologic: Alert and oriented X 3, normal strength and tone. Normal symmetric reflexes. Normal coordination and gait  ECOG PERFORMANCE STATUS: 0 - Asymptomatic  Blood pressure 120/69, pulse 91, temperature 98.4 F (36.9 C), temperature source Temporal, resp. rate  16, height 5\' 6"  (1.676 m), weight 175 lb 3.2 oz (79.5 kg), SpO2 100%.  LABORATORY DATA: Lab Results  Component Value Date   WBC 8.7 12/02/2023   HGB 11.0 (L) 12/02/2023   HCT 33.8 (L) 12/02/2023   MCV 92.9 12/02/2023   PLT 641 (H) 12/02/2023      Chemistry      Component Value Date/Time   NA 138 12/02/2023 1557   NA 142 05/26/2023 0938   K 3.8 12/02/2023 1557   CL 103 12/02/2023 1557   CO2 27 12/02/2023 1557   BUN 13 12/02/2023 1557   BUN 17 05/26/2023 0938   CREATININE 0.72 12/02/2023 1557      Component Value Date/Time   CALCIUM 9.6 12/02/2023 1557   ALKPHOS 123 12/02/2023 1557   AST 16 12/02/2023 1557   ALT 13 12/02/2023 1557   BILITOT 0.3 12/02/2023 1557       RADIOGRAPHIC STUDIES: CT CHEST ABDOMEN PELVIS W CONTRAST Result Date: 12/03/2023 CLINICAL DATA:  Non-small cell lung cancer restaging * Tracking Code: BO * EXAM: CT CHEST, ABDOMEN, AND PELVIS WITH CONTRAST TECHNIQUE: Multidetector CT imaging of the chest, abdomen and pelvis was performed following the standard protocol during bolus administration of intravenous contrast. RADIATION DOSE REDUCTION: This exam was performed according to the departmental dose-optimization program which includes automated exposure control, adjustment of the mA and/or kV according to patient size and/or use of iterative reconstruction technique. CONTRAST:  OMNIPAQUE IOHEXOL 300 MG/ML  SOLN COMPARISON:  08/26/2023 FINDINGS: CT CHEST FINDINGS Cardiovascular: No significant vascular findings. Normal heart size. No pericardial effusion. Mediastinum/Nodes: No enlarged mediastinal, hilar, or axillary lymph nodes. Unchanged thymic remnant or rebound in the anterior mediastinum. Thyroid gland, trachea, and esophagus demonstrate no significant findings. Lungs/Pleura: Unchanged post treatment appearance of the left lung base, again with irregular residua of an infrahilar left lower lobe mass as well as irregular scarring and interlobular septal  thickening throughout the left lung base (series 4, image 116). Unchanged, trace, loculated left pleural effusion. No right pleural effusion. Musculoskeletal: No chest wall abnormality. No acute osseous findings. CT ABDOMEN PELVIS FINDINGS Hepatobiliary: No solid liver abnormality is seen. Contracted gallbladder no gallstones, gallbladder wall thickening, or biliary dilatation. Pancreas: Unremarkable. No pancreatic ductal dilatation or surrounding inflammatory changes. Spleen: Normal in size without significant abnormality. Adrenals/Urinary Tract: Adrenal glands are unremarkable. Unchanged mild bilateral caliectasis (series 2, image 67, 65). Kidneys are otherwise normal, without renal calculi, solid lesion, or hydronephrosis. Bladder is unremarkable. Stomach/Bowel: Stomach is within normal limits. Appendix appears normal. No evidence of bowel wall thickening, distention, or inflammatory changes. Moderate burden of stool throughout the colon and rectum. Vascular/Lymphatic: No significant vascular findings are present. No enlarged abdominal or pelvic lymph nodes. Reproductive: IUD in the fundal endometrial cavity. Other: Unchanged small umbilical hernia.  No ascites. Musculoskeletal: No acute osseous findings. IMPRESSION: 1.  Unchanged post treatment appearance of the left lung base, again with irregular residua of an infrahilar left lower lobe mass as well as irregular scarring and interlobular septal thickening throughout the left lung base. 2. Unchanged, trace, loculated left pleural effusion. 3. No evidence of lymphadenopathy or metastatic disease in the chest, abdomen, or pelvis. Electronically Signed   By: Jearld Lesch M.D.   On: 12/03/2023 14:45   VAS [place] ABI WITH/WO TBI Result Date: 11/14/2023  LOWER EXTREMITY DOPPLER STUDY Patient Name:  EMMALEAH MERONEY  Date of Exam:   11/14/2023 Medical Rec #: [id number]     Accession #:    [id number] Date of Birth: 01/19/1986      Patient Gender: F Patient Age:   38 years Exam  Location:  [facility] Procedure:      VAS [place] ABI WITH/WO TBI Referring Phys: Heath Lark --------------------------------------------------------------------------------  Indications: Rest pain. worsening right leg pain  Vascular Interventions: 11/27/22: mechanical thrombectomy of left profunda femoris                         artery                         mechanical.thrombectomy of left popliteal artery.                         Left ATA laser atherectomy and angioplasty. Performing Technologist: [place] Ponder RVS  Examination Guidelines: A complete evaluation includes at minimum, Doppler waveform signals and systolic blood pressure reading at the level of bilateral brachial, anterior tibial, and posterior tibial arteries, when vessel segments are accessible. Bilateral testing is considered an integral part of a complete examination. Photoelectric Plethysmograph (PPG) waveforms and toe systolic pressure readings are included as required and additional duplex testing as needed. Limited examinations for reoccurring indications may be performed as noted.  ABI Findings: +---------+------------------+-----+--------+--------+ Right    Rt Pressure (mmHg)IndexWaveformComment  +---------+------------------+-----+--------+--------+ Brachial 124                                     +---------+------------------+-----+--------+--------+ PTA      109               0.84 biphasic         +---------+------------------+-----+--------+--------+ DP       101               0.78 biphasic         +---------+------------------+-----+--------+--------+ Great Toe76                0.58                  +---------+------------------+-----+--------+--------+ +---------+------------------+-----+----------+-------+ Left     Lt Pressure (mmHg)IndexWaveform  Comment +---------+------------------+-----+----------+-------+ Brachial 130                                       +---------+------------------+-----+----------+-------+ PTA      103               0.79 triphasic         +---------+------------------+-----+----------+-------+ DP       85                0.65 monophasic        +---------+------------------+-----+----------+-------+  Great Toe88                0.68                   +---------+------------------+-----+----------+-------+ +-------+-----------+-----------+------------+------------+ ABI/TBIToday's ABIToday's TBIPrevious ABIPrevious TBI +-------+-----------+-----------+------------+------------+ Right  0.84       0.58       0.80        0.48         +-------+-----------+-----------+------------+------------+ Left   0.79       0.68       0.76        0.55         +-------+-----------+-----------+------------+------------+ Bilateral ABIs appear essentially unchanged compared to prior study on 07/11/23.  Summary: Right: Resting right ankle-brachial index indicates mild right lower extremity arterial disease. The right toe-brachial index is abnormal. Left: Resting left ankle-brachial index indicates moderate left lower extremity arterial disease. The left toe-brachial index is abnormal. *See table(s) above for measurements and observations.  Electronically signed by Carolynn Sayers on 11/14/2023 at 1:53:21 PM.    Final    VAS [place] LOWER EXTREMITY ARTERIAL DUPLEX Result Date: 11/14/2023 LOWER EXTREMITY ARTERIAL DUPLEX STUDY Patient Name:  QUANTISHA MARSICANO  Date of Exam:   11/14/2023 Medical Rec #: [id number]     Accession #:    [id number] Date of Birth: [birth year]      Patient Gender: F Patient Age:   16 years Exam Location:  [facility] Procedure:      VAS [place] LOWER EXTREMITY ARTERIAL DUPLEX Referring Phys: Heath Lark --------------------------------------------------------------------------------  Indications: Rest pain.  Vascular Interventions: 11/27/22: mechanical thrombectomy of left profunda femoris                          artery                         mechanical.thrombectomy of left popliteal artery.                         Left ATA laser atherectomy and angioplasty. Current ABI:            R 0.84 L 0.79 Performing Technologist: [place] Ponder RVS  Examination Guidelines: A complete evaluation includes B-mode imaging, spectral Doppler, color Doppler, and power Doppler as needed of all accessible portions of each vessel. Bilateral testing is considered an integral part of a complete examination. Limited examinations for reoccurring indications may be performed as noted.  +-----------+--------+-----+---------------+---------+--------+ RIGHT      PSV cm/sRatioStenosis       Waveform Comments +-----------+--------+-----+---------------+---------+--------+ CFA Mid    230          50-74% stenosistriphasic         +-----------+--------+-----+---------------+---------+--------+ DFA        132                         triphasic         +-----------+--------+-----+---------------+---------+--------+ SFA Prox   140                         triphasic         +-----------+--------+-----+---------------+---------+--------+ SFA Mid    138                         triphasic         +-----------+--------+-----+---------------+---------+--------+  SFA Distal 91                          triphasic         +-----------+--------+-----+---------------+---------+--------+ POP Prox   69                          triphasic         +-----------+--------+-----+---------------+---------+--------+ TP Trunk   128                         triphasic         +-----------+--------+-----+---------------+---------+--------+ ATA Distal 22                          biphasic          +-----------+--------+-----+---------------+---------+--------+ PTA Distal 34                          triphasic         +-----------+--------+-----+---------------+---------+--------+ PERO Distal17                           biphasic          +-----------+--------+-----+---------------+---------+--------+   Summary: Right: 50-74% stenosis noted in the common femoral artery.  See table(s) above for measurements and observations. Electronically signed by Carolynn Sayers on 11/14/2023 at 1:53:02 PM.    Final     ASSESSMENT AND PLAN: This is a very pleasant 38 years old white female with  Stage IVB (T4, N3, M1b) non-small cell lung cancer, adenocarcinoma presented with large left diaphragmatic surface mass in addition to left hilar, infrahilar, AP window, right and left paratracheal, subcarinal as well as left internal mammary lymphadenopathy in addition to right gastric lymph node and solitary small right frontal calvarial osseous metastatic disease with large malignant left pleural effusion diagnosed in January 2024.   Fortunately her molecular studies showed positive ROS1 fusion.  The patient also had PD-L1 expression of 98%. The patient is currently undergoing treatment was started therapy with repotrectinib started November 27, 2022.  Status post 12.5 months of treatment and has been tolerating it fairly well. Assessment and Plan    Stage IV Non-Small Cell Lung Cancer (NSCLC), Adenocarcinoma with ROS1 Fusion Melissa Pineda, a 38 year old female, was diagnosed with stage IV NSCLC, adenocarcinoma with ROS1 fusion in January 2024. She has been on repotrectinib 160 mg twice daily since November 27, 2022. Recent scans show well-controlled cancer with no new concerning findings. She reports mild dyspnea, etiology unclear. No significant side effects from repotrectinib. Discussed potential myopathy and inflammation from medication, typically bilateral. MRI will rule out cancer-related issues in the foot, though metastasis to a single localized area like the foot is rare. Radiation therapy is an option if a bone lesion is found. - Continue repotrectinib 160 mg twice daily - Follow up in 2 months with lab work - Follow up in 4 months  with lab work and scan  Posterior Tibial Tendonitis Stevie reports swelling and soreness in her right foot, diagnosed as posterior tibial tendonitis with arch collapse. Initial treatment with methylprednisolone was ineffective, possibly due to interaction with repotrectinib. X-ray showed no abnormalities. Advised to immobilize the foot with a boot. Physical therapy evaluation suggested an MRI due to persistent and severe symptoms. MRI will provide detailed information for further  treatment. - Proceed with MRI of the right foot - Follow up with [facility] for MRI results and further management  Mild Anemia Mild anemia persists with slightly elevated white blood cell count, likely due to recent steroid use. - Monitor anemia and white blood cell count in follow-up labs  General Health Maintenance [place] has been encouraged to exercise regularly. - Continue encouraging regular exercise  Follow-up - Follow up in two months with lab work - Follow up in four months with lab work and scan.   The patient was advised to call immediately if she has any other concerning symptoms in the interval. The patient voices understanding of current disease status and treatment options and is in agreement with the current care plan.  All questions were answered. The patient knows to call the clinic with any problems, questions or concerns. We can certainly see the patient much sooner if necessary.  The total time spent in the appointment was 30 minutes.  Disclaimer: This note was dictated with voice recognition software. Similar sounding words can inadvertently be transcribed and may not be corrected upon review.

## 2023-12-12 ENCOUNTER — Inpatient Hospital Stay: Payer: BC Managed Care – PPO | Admitting: Dietician

## 2023-12-12 NOTE — Progress Notes (Signed)
Nutrition Assessment   Reason for Assessment: Pt request   ASSESSMENT: 38 year old female with stage IV lung cancer. She is receiving Augtyro 160 mg po (start 11/27/22). Patient is under the care of Dr. Arbutus Ped.   Past medical history includes critical lower extremity limb ischemia, embolic stroke, alopecia, anxiety  Met with patient in office. She is tolerating therapy well overall. Patient has experienced increased weight with Augtyro. Patient endorses good appetite and  recalls healthy diet overall. Eating patterns are not consistent. Sometimes she grazes all day and others she will have larger portion sizes. Occasionally, appetite will be low which she is currently experiencing. She recently began working with a trainer to increase LBM. This is currently on hold secondary to recent swelling to right lower ankle/foot. Patient seen by [facility] for this and diagnosed with tendonitis. This continued to worsen with swelling and decreased mobility. She is wearing a boot at this time. MRI has been recommended (planned 3/7). She denies nausea, vomiting, diarrhea. She does have constipation. Having small bowel movements daily. She takes miralax, but not regularly. Patient drinking 64 ounces of water.    Nutrition Focused Physical Exam: deferred    Medications: xanax, bcomplex, D3, plavix, ferrous sulfate, reglan, zofran, protonix, miralax, prednisone, xarelto   Labs: 2/11 - glucose 140   Anthropometrics:   Height: 5'6" Weight: 175 lb 3.2 oz  UBW: 120-130 lb (per ot - before pregnancy)  BMI: 28.28   Estimated Energy Needs  Kcals: 1700-2000 Protein: 100-110 Fluid: >/= 2 L   NUTRITION DIAGNOSIS: Food and nutrition related knowledge deficit related to cancer as evidenced by no prior need for associated nutrition information     INTERVENTION:  Educated on importance of adequate calories and protein to preserve loss of LBM Educated on foods with protein, recommend protein source at  every meal Provided sample 1700,1800,2000 kcal menus for reference  Encourage activity as able - she is working with trainer (suggested focusing on upper body strength while foot is healing) Recommend daily miralax for constipation Contact information provided     MONITORING, EVALUATION, GOAL: Patient will tolerate adequate calories and protein to preserve LBM   Next Visit: No follow up scheduled at this time. Patient encouraged to contact with nutrition questions/concerns

## 2023-12-26 DIAGNOSIS — M25571 Pain in right ankle and joints of right foot: Secondary | ICD-10-CM | POA: Diagnosis not present

## 2023-12-27 DIAGNOSIS — M76821 Posterior tibial tendinitis, right leg: Secondary | ICD-10-CM | POA: Diagnosis not present

## 2023-12-29 DIAGNOSIS — S82391A Other fracture of lower end of right tibia, initial encounter for closed fracture: Secondary | ICD-10-CM | POA: Diagnosis not present

## 2023-12-29 DIAGNOSIS — M76821 Posterior tibial tendinitis, right leg: Secondary | ICD-10-CM | POA: Diagnosis not present

## 2023-12-29 DIAGNOSIS — S82421A Displaced transverse fracture of shaft of right fibula, initial encounter for closed fracture: Secondary | ICD-10-CM | POA: Diagnosis not present

## 2024-01-12 NOTE — Progress Notes (Deleted)
 [facility] [place]  ASSESSMENT / PLAN: 38 y.o. female with subacute bilateral lower extremity ischemia causing rest pain in the left lower extremity and claudication in the right lower extremity. Likely from cardioembolism or tumor embolism to lower extremities. This was managed with successful endovascular thrombectomy 11/27/22 with resolution of rest pain.  Her claudication stable.  She is no longer experiencing paresthesias She had a silent stroke identified in March 2024. Repeat MRI showed no new stroke.   Transesophageal echocardiogram showed no persistent thrombus in the heart.  There was a very small patent foramen ovale, which did not need treatment.  Dr. Shirline Frees, Dr. Excell Seltzer, and myself discussed the case.  We all agreed to de-escalate her anticoagulation and antithrombotic therapy to Xarelto alone.  I encouraged her to do daily walking.  I will see her again in 6 months with an ankle-brachial index.  CHIEF COMPLAINT: Severe leg pain  HISTORY OF PRESENT ILLNESS: Melissa Pineda is a 38 y.o. female who was urgently added into the clinic today for evaluation of ultrasound findings.  The patient has had an unusual course.  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  She is a never smoker.  She had malignant effusion and required a Pleurx catheter.  She reports worsening leg pain over the past several days.  Prior to this, she describes leg pain like shinsplints.  She reports severe pain about the metatarsals and toes on the left, classic for ischemic rest pain.  She reports cramping discomfort in the right leg, which is bearable to her.  01/06/23: Patient returns to clinic after intervention and beginning immunotherapy.  She is doing much better overall.  She looks much more healthy.  She does describe some cramping discomfort with walking and easy  fatigability.  She does not describe rest pain.  Her symptoms are manageable for her.  Since CT scan of the chest shows good response to immunotherapy.  04/08/23: Patient returns to clinic.  She is doing very well.  She has been told she has a complete response to her directed therapy for adenocarcinoma of the lung.  She is walking is much as she likes.  She has noticed easy bruising.  She has not noticed problematic bleeding.  She does not have menorrhagia.  She seems to be tolerating triple therapy fairly well.  She did suffer a punctate stroke in March 2024.  A small speech deficit has since resolved.  She had a repeat MRI scan of the brain today looking for new infarcts.  I reviewed these findings with her.  I agree with her oncologist, continuing maximal anticoagulant and antiplatelet therapy seems reasonable for the foreseeable future.  05/12/23: Patient returns to clinic.  She reports new claudication type symptoms in both legs.  She has cramping discomfort in her proximal calves and behind the knee bilaterally.  She does not have rest pain.  She also describes paresthesias in both legs.  These occur with rest or activity.  She also describes paresthesias in the left upper extremity.  We reviewed her recent course.    07/15/23: Patient returns for surveillance.  We reviewed her TEE.  She is overall doing okay.  She has fairly stable claudication symptoms in her legs.  She can play with her child and walk, but is limited in the strenuous activity.    VASCULAR SURGICAL HISTORY: none  VASCULAR RISK FACTORS: Negative history of stroke / transient ischemic attack. Negative history of coronary artery disease.  Negative history of diabetes mellitus.  Negative history of smoking. Negative history of hypertension.  Negative history of chronic kidney disease.  Negative history of chronic obstructive pulmonary disease.  FUNCTIONAL STATUS: ECOG performance status: (0) Fully active, able to carry on all  predisease performance without restriction Ambulatory status: Ambulatory within the community without limits  Past Medical History:  Diagnosis Date   Anxiety    [employer] 11/21/2020   [facility])     Past Surgical History:  Procedure Laterality Date   ABDOMINAL AORTOGRAM W/LOWER EXTREMITY N/A 11/27/2022   Procedure: ABDOMINAL AORTOGRAM W/LOWER EXTREMITY;  Surgeon: Leonie Douglas, MD;  Location: [facility]VASIVE [facility];  Service: Cardiovascular;  Laterality: N/A;   IR PERC PLEURAL DRAIN W/INDWELL CATH W/IMG GUIDE  11/09/2022   PERIPHERAL VASCULAR ATHERECTOMY  11/27/2022   Procedure: PERIPHERAL VASCULAR ATHERECTOMY;  Surgeon: Leonie Douglas, MD;  Location: [facility];  Service: Cardiovascular;;   PERIPHERAL VASCULAR THROMBECTOMY  11/27/2022   Procedure: PERIPHERAL VASCULAR THROMBECTOMY;  Surgeon: Leonie Douglas, MD;  Location: [facility];  Service: Cardiovascular;;   REMOVAL OF PLEURAL DRAINAGE CATHETER N/A 12/17/2022   Procedure: MINOR REMOVAL OF PLEURAL DRAINAGE CATHETER;  Surgeon: Josephine Igo, DO;  Location: [facility];  Service: Cardiopulmonary;  Laterality: N/A;   TEE WITHOUT CARDIOVERSION N/A 06/10/2023   Procedure: TRANSESOPHAGEAL ECHOCARDIOGRAM;  Surgeon: Chilton Si, MD;  Location: [facility] INVASIVE CV LAB;  Service: Cardiovascular;  Laterality: N/A;   WISDOM TOOTH EXTRACTION      Family History  Problem Relation Age of Onset   Cancer Mother    Cancer Maternal Grandfather    Cancer Maternal Grandmother    Cancer Paternal Grandfather    Hypertension Paternal Grandmother     Social History   Socioeconomic History   Marital status: Married    Spouse name: Not on file   Number of children: Not on file   Years of education: Not on file   Highest education level: Not on file  Occupational History   Not on file  Tobacco Use   Smoking status: Never   Smokeless tobacco: Never  Vaping Use   Vaping status: Never Used  Substance and Sexual Activity    Alcohol use: Yes    Alcohol/week: 7.0 standard drinks of alcohol    Types: 7 Glasses of wine per week    Comment: 1 glass wine in the evening    Drug use: Yes    Types: Marijuana    Comment: occasional   Sexual activity: Yes    Birth control/protection: I.U.D.  Other Topics Concern   Not on file  Social History Narrative   Not on file   Social Drivers of Health   Financial Resource Strain: Not on file  Food Insecurity: No Food Insecurity (11/26/2022)   Hunger Vital Sign    Worried About Running Out of Food in the Last Year: Never true    Ran Out of Food in the Last Year: Never true  Transportation Needs: No Transportation Needs (11/26/2022)   PRAPARE - Administrator, Civil Service (Medical): No    Lack of Transportation (Non-Medical): No  Physical Activity: Not on file  Stress: Not on file  Social Connections: Not on file  Intimate Partner Violence: Not At Risk (11/26/2022)   Humiliation, Afraid, Rape, and Kick questionnaire    Fear of Current or Ex-Partner: No  Emotionally Abused: No    Physically Abused: No    Sexually Abused: No    Allergies  Allergen Reactions   Hydrocodone Nausea Only    Dizzy, "crawl out of my skin"   Tramadol Other (See Comments)    "Weird feeling"   Iodinated Contrast Media Hives    Current Outpatient Medications  Medication Sig Dispense Refill   acetaminophen (TYLENOL) 325 MG tablet Take 650 mg by mouth every 6 (six) hours as needed for mild pain.     ALPRAZolam (XANAX) 0.5 MG tablet Take 1 tablet (0.5 mg total) by mouth 3 (three) times daily as needed for anxiety. 60 tablet 0   ascorbic acid (VITAMIN C) 500 MG tablet Take 500 mg by mouth daily.     aspirin EC 81 MG tablet Take 1 tablet (81 mg total) by mouth daily. Swallow whole. 30 tablet 12   b complex vitamins capsule Take 1 capsule by mouth daily.     Cholecalciferol (VITAMIN D-3 PO) Take 2,000 Units by mouth daily.     clopidogrel (PLAVIX) 75 MG tablet Take 1 tablet (75 mg  total) by mouth daily. 30 tablet 11   Ferrous Sulfate (IRON HIGH-POTENCY) 142 (45 Fe) MG TBCR Take 142 mg by mouth daily.     levonorgestrel (MIRENA, 52 MG,) 20 MCG/DAY IUD 1 each by Intrauterine route once.     metoCLOPramide (REGLAN) 5 MG tablet Take 1 tablet (5 mg total) by mouth every 6 (six) hours as needed for nausea. 30 tablet 1   ondansetron (ZOFRAN) 8 MG tablet Take 1 tablet (8 mg total) by mouth every 8 (eight) hours as needed for nausea or vomiting. 20 tablet 0   ondansetron (ZOFRAN-ODT) 8 MG disintegrating tablet Take 1 tablet (8 mg total) by mouth every 8 (eight) hours as needed for nausea or vomiting. 20 tablet 1   Ondansetron 4 MG FILM Take 4-8 mg by mouth every 6 (six) hours as needed. 15 each 0   pantoprazole (PROTONIX) 40 MG tablet Take 1 tablet (40 mg total) by mouth daily. 30 tablet 3   polyethylene glycol (MIRALAX / GLYCOLAX) 17 g packet Take 17 g by mouth daily as needed for moderate constipation.     predniSONE (DELTASONE) 50 MG tablet Take 1 tablet (50 mg total) by mouth as directed. Take one tablet 13 hours , 1 tablet 7 hours and 1 tablet 1 hour prior to CT scan with IV contrast.  Take Benadryl 50 mg po 1 hour before CT scan (Patient not taking: Reported on 11/14/2023) 3 tablet 5   repotrectinib (AUGTYRO) 40 MG capsule Take 4 capsules (160 mg total) by mouth 2 (two) times daily. 240 capsule 2   XARELTO 20 MG TABS tablet TAKE 1 TABLET(20 MG) BY MOUTH DAILY WITH SUPPER 90 tablet 1   Zinc 22.5 MG TABS Take 22.5 mg by mouth daily.     No current facility-administered medications for this visit.    PHYSICAL EXAM There were no vitals filed for this visit.  Young woman.  Anxious. Mild tachycardia Regular rate and rhythm Bilateral feet are warm No palpable pedal pulses.    PERTINENT LABORATORY AND RADIOLOGIC DATA  Most recent CBC    Latest Ref Rng & Units 12/02/2023    3:57 PM 10/29/2023    8:34 AM 08/26/2023    3:40 PM  CBC  WBC 4.0 - 10.5 K/uL 8.7  5.7  5.5    Hemoglobin 12.0 - 15.0 g/dL 91.4  78.2  95.6  Hematocrit 36.0 - 46.0 % 33.8  31.9  33.8   Platelets 150 - 400 K/uL 641  496  611      Most recent CMP    Latest Ref Rng & Units 12/02/2023    3:57 PM 10/29/2023    8:34 AM 08/26/2023    3:40 PM  CMP  Glucose 70 - 99 mg/dL 440  347  425   BUN 6 - 20 mg/dL 13  17  16    Creatinine 0.44 - 1.00 mg/dL 9.56  3.87  5.64   Sodium 135 - 145 mmol/L 138  138  142   Potassium 3.5 - 5.1 mmol/L 3.8  3.8  3.8   Chloride 98 - 111 mmol/L 103  104  105   CO2 22 - 32 mmol/L 27  28  29    Calcium 8.9 - 10.3 mg/dL 9.6  9.1  33.2   Total Protein 6.5 - 8.1 g/dL 8.2  7.3  8.4   Total Bilirubin 0.0 - 1.2 mg/dL 0.3  0.4  0.3   Alkaline Phos 38 - 126 U/L 123  69  78   AST 15 - 41 U/L 16  18  18    ALT 0 - 44 U/L 13  13  16      Renal function CrCl cannot be calculated (Patient's most recent lab result is older than the maximum 21 days allowed.).  Hgb A1c MFr Bld (%)  Date Value  11/24/2020 5.0    LDL Chol Calc (NIH)  Date Value Ref Range Status  11/24/2020 89 0 - 99 mg/dL Final   LDL Cholesterol  Date Value Ref Range Status  11/28/2022 104 (H) 0 - 99 mg/dL Final    Comment:           Total Cholesterol/HDL:CHD Risk Coronary Heart Disease Risk Table                     Men   Women  1/2 Average Risk   3.4   3.3  Average Risk       5.0   4.4  2 X Average Risk   9.6   7.1  3 X Average Risk  23.4   11.0        Use the calculated Patient Ratio above and the CHD Risk Table to determine the patient's CHD Risk.        ATP III CLASSIFICATION (LDL):  <100     mg/dL   Optimal  951-884  mg/dL   Near or Above                    Optimal  130-159  mg/dL   Borderline  166-063  mg/dL   High  >016     mg/dL   Very High Performed at [facility], [address] [place], [place] [address]      Vascular Imaging:   +-------+-----------+-----------+------------+------------+  ABI/TBIToday's ABIToday's TBIPrevious ABIPrevious TBI   +-------+-----------+-----------+------------+------------+  Right 0.80       0.48       0.81        0.58          +-------+-----------+-----------+------------+------------+  Left  0.76       0.55       0.91        0.62          +-------+-----------+-----------+------------+------------+    Rande Brunt. Lenell Antu, MD FACS [facility] Phone Number: [phone] 01/12/2024 7:35 AM   Total time spent on preparing this encounter including chart review, data review, collecting history, examining the patient, coordinating care for this patient, 30 minutes.  Portions of this report may have been transcribed using voice recognition software.  Every effort has been made to ensure accuracy; however, inadvertent computerized transcription errors may still be present.

## 2024-01-13 ENCOUNTER — Ambulatory Visit (HOSPITAL_COMMUNITY): Payer: BC Managed Care – PPO

## 2024-01-13 ENCOUNTER — Ambulatory Visit: Payer: BC Managed Care – PPO | Admitting: Vascular Surgery

## 2024-01-16 ENCOUNTER — Other Ambulatory Visit: Payer: Self-pay

## 2024-01-16 DIAGNOSIS — C3492 Malignant neoplasm of unspecified part of left bronchus or lung: Secondary | ICD-10-CM

## 2024-01-16 MED ORDER — REPOTRECTINIB 40 MG PO CAPS: 160.0000 mg | ORAL_CAPSULE | Freq: Two times a day (BID) | ORAL | 2 refills | Status: DC

## 2024-01-20 ENCOUNTER — Other Ambulatory Visit: Payer: Self-pay | Admitting: Medical Oncology

## 2024-01-20 NOTE — Telephone Encounter (Signed)
 Repotrectinib refill sent 01/16/2024.

## 2024-01-28 ENCOUNTER — Other Ambulatory Visit: Payer: Self-pay | Admitting: Vascular Surgery

## 2024-01-30 DIAGNOSIS — S82421A Displaced transverse fracture of shaft of right fibula, initial encounter for closed fracture: Secondary | ICD-10-CM | POA: Diagnosis not present

## 2024-01-30 DIAGNOSIS — S82391A Other fracture of lower end of right tibia, initial encounter for closed fracture: Secondary | ICD-10-CM | POA: Diagnosis not present

## 2024-02-11 ENCOUNTER — Inpatient Hospital Stay (HOSPITAL_BASED_OUTPATIENT_CLINIC_OR_DEPARTMENT_OTHER): Payer: BC Managed Care – PPO | Admitting: Internal Medicine

## 2024-02-11 ENCOUNTER — Telehealth: Payer: Self-pay | Admitting: Internal Medicine

## 2024-02-11 ENCOUNTER — Inpatient Hospital Stay: Payer: BC Managed Care – PPO | Attending: Internal Medicine

## 2024-02-11 VITALS — BP 123/69 | HR 72 | Temp 97.7°F | Resp 16 | Ht 66.0 in | Wt 179.0 lb

## 2024-02-11 DIAGNOSIS — D649 Anemia, unspecified: Secondary | ICD-10-CM | POA: Diagnosis not present

## 2024-02-11 DIAGNOSIS — C7951 Secondary malignant neoplasm of bone: Secondary | ICD-10-CM | POA: Diagnosis not present

## 2024-02-11 DIAGNOSIS — Z7982 Long term (current) use of aspirin: Secondary | ICD-10-CM | POA: Insufficient documentation

## 2024-02-11 DIAGNOSIS — Z79899 Other long term (current) drug therapy: Secondary | ICD-10-CM | POA: Diagnosis not present

## 2024-02-11 DIAGNOSIS — C3492 Malignant neoplasm of unspecified part of left bronchus or lung: Secondary | ICD-10-CM | POA: Diagnosis not present

## 2024-02-11 DIAGNOSIS — C3490 Malignant neoplasm of unspecified part of unspecified bronchus or lung: Secondary | ICD-10-CM | POA: Diagnosis not present

## 2024-02-11 LAB — CMP (CANCER CENTER ONLY)
ALT: 15 U/L (ref 0–44)
AST: 18 U/L (ref 15–41)
Albumin: 4.5 g/dL (ref 3.5–5.0)
Alkaline Phosphatase: 91 U/L (ref 38–126)
Anion gap: 5 (ref 5–15)
BUN: 16 mg/dL (ref 6–20)
CO2: 28 mmol/L (ref 22–32)
Calcium: 8.8 mg/dL — ABNORMAL LOW (ref 8.9–10.3)
Chloride: 107 mmol/L (ref 98–111)
Creatinine: 0.78 mg/dL (ref 0.44–1.00)
GFR, Estimated: >60 mL/min (ref >60)
Glucose, Bld: 115 mg/dL — ABNORMAL HIGH (ref 70–99)
Potassium: 4 mmol/L (ref 3.5–5.1)
Sodium: 140 mmol/L (ref 135–145)
Total Bilirubin: 0.4 mg/dL (ref 0.0–1.2)
Total Protein: 7.2 g/dL (ref 6.5–8.1)

## 2024-02-11 LAB — CBC WITH DIFFERENTIAL (CANCER CENTER ONLY)
Abs Immature Granulocytes: 0.01 10*3/uL (ref 0.00–0.07)
Basophils Absolute: 0 10*3/uL (ref 0.0–0.1)
Basophils Relative: 0 %
Eosinophils Absolute: 0.1 10*3/uL (ref 0.0–0.5)
Eosinophils Relative: 2 %
HCT: 32.4 % — ABNORMAL LOW (ref 36.0–46.0)
Hemoglobin: 10.6 g/dL — ABNORMAL LOW (ref 12.0–15.0)
Immature Granulocytes: 0 %
Lymphocytes Relative: 33 %
Lymphs Abs: 1.8 10*3/uL (ref 0.7–4.0)
MCH: 29.9 pg (ref 26.0–34.0)
MCHC: 32.7 g/dL (ref 30.0–36.0)
MCV: 91.5 fL (ref 80.0–100.0)
Monocytes Absolute: 0.4 10*3/uL (ref 0.1–1.0)
Monocytes Relative: 7 %
Neutro Abs: 3.2 10*3/uL (ref 1.7–7.7)
Neutrophils Relative %: 58 %
Platelet Count: 450 10*3/uL — ABNORMAL HIGH (ref 150–400)
RBC: 3.54 MIL/uL — ABNORMAL LOW (ref 3.87–5.11)
RDW: 13.4 % (ref 11.5–15.5)
WBC Count: 5.5 10*3/uL (ref 4.0–10.5)
nRBC: 0 % (ref 0.0–0.2)

## 2024-02-11 LAB — CK: Total CK: 93 U/L (ref 38–234)

## 2024-02-11 NOTE — Progress Notes (Signed)
 [facility] [phone]   [phone]  OFFICE PROGRESS NOTE  Patient, No Pcp Per No address on file  DIAGNOSIS: Stage IVB (T4, N3, M1b) non-small cell lung cancer, adenocarcinoma presented with large left diaphragmatic surface mass in addition to left hilar, infrahilar, AP window, right and left paratracheal, subcarinal as well as left internal mammary lymphadenopathy in addition to right gastric lymph node and solitary Small right frontal calvarial osseous metastatic disease with large malignant left pleural effusion diagnosed in January 2024.    Molecular studies by [employer] as well as [employer] blood test showed:   EZR-ROS1 fusion approved by FDA Crizotinib, Entrectinib, Repotrectinib    approved in other indication Ceritinib, Lorlatinib Yes      3.6%   PD-L1 expression by [employer] that was 98%.   PRIOR THERAPY: Status post left Pleurx catheter placement for drainage of recurrent left pleural effusion.  This was removed on December 17, 2022.  CURRENT THERAPY: Repotrectinib  (Augtyro ) 160 mg p.o. daily for the first 2 weeks and then 160 mg p.o. twice daily, first dose November 27, 2022.  She is status post 15 months of treatment  INTERVAL HISTORY: Makaelyn Hanford 38 y.o. female returns to the clinic today for follow-up visit. Discussed the use of AI scribe software for clinical note transcription with the patient, who gave verbal consent to proceed.  History of Present Illness   Felina Tello is a 38 year old female who presents for evaluation and repeat blood work while undergoing treatment with Repotrectinib .  She Stage IVB (T4, N3, M1b) non-small cell lung cancer, adenocarcinoma presented with large left diaphragmatic surface mass in addition to left hilar, infrahilar, AP window, right and left paratracheal, subcarinal as well as left internal mammary lymphadenopathy in addition to right gastric lymph node and solitary Small right  frontal calvarial osseous metastatic disease with large malignant left pleural effusion diagnosed in January 2024 with positive ROS1 fusion and has been on Repotrectinib , 160 mg twice daily, since February 2024. After 15 months of treatment, she continues to experience anemia with a hemoglobin level of 10.6 and platelet count of 440,000, managed with iron supplements. Occasional side pain is attributed to previous fluid accumulation. No other significant side effects from Repotrectinib  were reported.  In the last two months, she experienced a fracture in her right foot, initially thought to be a tendon issue. Despite initial normal x-rays, further investigation with MRIs revealed a break in two spots. The fracture was slightly displaced, and she was advised to avoid putting weight on it. She was on vacation at the time of the injury, which involved a lot of walking, potentially contributing to the fracture. She is taking vitamin D and magnesium  to aid bone health.  She has a history of an embolic event in the opposite leg, with clots behind the knee, but not as severe as the current leg.        MEDICAL HISTORY: Past Medical History:  Diagnosis Date   Anxiety    [employer] 11/21/2020   Cancer (HCC)     ALLERGIES:  is allergic to hydrocodone , tramadol , and iodinated contrast media.  MEDICATIONS:  Current Outpatient Medications  Medication Sig Dispense Refill   acetaminophen  (TYLENOL ) 325 MG tablet Take 650 mg by mouth every 6 (six) hours as needed for mild pain.     ALPRAZolam  (XANAX ) 0.5 MG tablet Take 1 tablet (0.5 mg total) by mouth 3 (three) times daily as needed for anxiety. 60 tablet 0  ascorbic acid (VITAMIN C) 500 MG tablet Take 500 mg by mouth daily.     aspirin  EC 81 MG tablet Take 1 tablet (81 mg total) by mouth daily. Swallow whole. 30 tablet 12   b complex vitamins capsule Take 1 capsule by mouth daily.     Cholecalciferol (VITAMIN D-3 PO) Take 2,000 Units by mouth daily.      clopidogrel  (PLAVIX ) 75 MG tablet Take 1 tablet (75 mg total) by mouth daily. 30 tablet 11   Ferrous Sulfate (IRON HIGH-POTENCY) 142 (45 Fe) MG TBCR Take 142 mg by mouth daily.     levonorgestrel  (MIRENA , 52 MG,) 20 MCG/DAY IUD 1 each by Intrauterine route once.     metoCLOPramide  (REGLAN ) 5 MG tablet Take 1 tablet (5 mg total) by mouth every 6 (six) hours as needed for nausea. 30 tablet 1   ondansetron  (ZOFRAN ) 8 MG tablet Take 1 tablet (8 mg total) by mouth every 8 (eight) hours as needed for nausea or vomiting. 20 tablet 0   ondansetron  (ZOFRAN -ODT) 8 MG disintegrating tablet Take 1 tablet (8 mg total) by mouth every 8 (eight) hours as needed for nausea or vomiting. 20 tablet 1   Ondansetron  4 MG FILM Take 4-8 mg by mouth every 6 (six) hours as needed. 15 each 0   pantoprazole  (PROTONIX ) 40 MG tablet Take 1 tablet (40 mg total) by mouth daily. 30 tablet 3   polyethylene glycol (MIRALAX  / GLYCOLAX ) 17 g packet Take 17 g by mouth daily as needed for moderate constipation.     predniSONE  (DELTASONE ) 50 MG tablet Take 1 tablet (50 mg total) by mouth as directed. Take one tablet 13 hours , 1 tablet 7 hours and 1 tablet 1 hour prior to CT scan with IV contrast.  Take Benadryl  50 mg po 1 hour before CT scan (Patient not taking: Reported on 11/14/2023) 3 tablet 5   repotrectinib  (AUGTYRO ) 40 MG capsule Take 4 capsules (160 mg total) by mouth 2 (two) times daily. 240 capsule 2   XARELTO  20 MG TABS tablet TAKE 1 TABLET(20 MG) BY MOUTH DAILY WITH SUPPER 90 tablet 1   Zinc 22.5 MG TABS Take 22.5 mg by mouth daily.     No current facility-administered medications for this visit.    SURGICAL HISTORY:  Past Surgical History:  Procedure Laterality Date   ABDOMINAL AORTOGRAM W/LOWER EXTREMITY N/A 11/27/2022   Procedure: ABDOMINAL AORTOGRAM W/LOWER EXTREMITY;  Surgeon: Carlene Che, MD;  Location: [facility] IN[facility]ASIVE [facility];  Service: Cardiovascular;  Laterality: N/A;   IR PERC PLEURAL DRAIN W/INDWELL CATH W/IMG  GUIDE  11/09/2022   PERIPHERAL VASCULAR ATHERECTOMY  11/27/2022   Procedure: PERIPHERAL VASCULAR ATHERECTOMY;  Surgeon: Carlene Che, MD;  Location: [facility];  Service: Cardiovascular;;   PERIPHERAL VASCULAR THROMBECTOMY  11/27/2022   Procedure: PERIPHERAL VASCULAR THROMBECTOMY;  Surgeon: Carlene Che, MD;  Location: [facility];  Service: Cardiovascular;;   REMOVAL OF PLEURAL DRAINAGE CATHETER N/A 12/17/2022   Procedure: MINOR REMOVAL OF PLEURAL DRAINAGE CATHETER;  Surgeon: Prudy Brownie, DO;  Location: [facility] ENDOSCOPY;  Service: Cardiopulmonary;  Laterality: N/A;   TEE WITHOUT CARDIOVERSION N/A 06/10/2023   Procedure: TRANSESOPHAGEAL ECHOCARDIOGRAM;  Surgeon: Maudine Sos, MD;  Location[facility] INVASIVE CV LAB;  Service: Cardiovascular;  Laterality: N/A;   WISDOM TOOTH EXTRACTION      REVIEW OF SYSTEMS:  Constitutional: positive for fatigue Eyes: negative Ears, nose, mouth, throat, and face: negative Respiratory: negative Cardiovascular: negative Gastrointestinal: negative Genitourinary:negative  Integument/breast: negative Hematologic/lymphatic: negative Musculoskeletal:positive for right foot pain Neurological: negative Behavioral/Psych: negative Endocrine: negative Allergic/Immunologic: negative   PHYSICAL EXAMINATION: General appearance: alert, cooperative, and no distress Head: Normocephalic, without obvious abnormality, atraumatic Neck: no adenopathy, no JVD, supple, symmetrical, trachea midline, and thyroid  not enlarged, symmetric, no tenderness/mass/nodules Lymph nodes: Cervical, supraclavicular, and axillary nodes normal. Resp: clear to auscultation bilaterally Back: symmetric, no curvature. ROM normal. No CVA tenderness. Cardio: regular rate and rhythm, S1, S2 normal, no murmur, click, rub or gallop GI: soft, non-tender; bowel sounds normal; no masses,  no organomegaly Extremities: extremities normal, atraumatic, no cyanosis or edema Neurologic: Alert  and oriented X 3, normal strength and tone. Normal symmetric reflexes. Normal coordination and gait  ECOG PERFORMANCE STATUS: 0 - Asymptomatic  Blood pressure 123/69, pulse 72, temperature 97.7 F (36.5 C), temperature source Temporal, resp. rate 16, height 5\' 6"  (1.676 m), weight 179 lb (81.2 kg), SpO2 100%.  LABORATORY DATA: Lab Results  Component Value Date   WBC 5.5 02/11/2024   HGB 10.6 (L) 02/11/2024   HCT 32.4 (L) 02/11/2024   MCV 91.5 02/11/2024   PLT 450 (H) 02/11/2024      Chemistry      Component Value Date/Time   NA 138 12/02/2023 1557   NA 142 05/26/2023 0938   K 3.8 12/02/2023 1557   CL 103 12/02/2023 1557   CO2 27 12/02/2023 1557   BUN 13 12/02/2023 1557   BUN 17 05/26/2023 0938   CREATININE 0.72 12/02/2023 1557      Component Value Date/Time   CALCIUM 9.6 12/02/2023 1557   ALKPHOS 123 12/02/2023 1557   AST 16 12/02/2023 1557   ALT 13 12/02/2023 1557   BILITOT 0.3 12/02/2023 1557       RADIOGRAPHIC STUDIES: No results found.   ASSESSMENT AND PLAN: This is a very pleasant 38 years old white female with  Stage IVB (T4, N3, M1b) non-small cell lung cancer, adenocarcinoma presented with large left diaphragmatic surface mass in addition to left hilar, infrahilar, AP window, right and left paratracheal, subcarinal as well as left internal mammary lymphadenopathy in addition to right gastric lymph node and solitary small right frontal calvarial osseous metastatic disease with large malignant left pleural effusion diagnosed in January 2024.   Fortunately her molecular studies showed positive ROS1 fusion.  The patient also had PD-L1 expression of 98%. The patient is currently undergoing treatment was started therapy with repotrectinib  started November 27, 2022.  Status post 15 months of treatment and has been tolerating it fairly well.    Stage IVB (T4, N3, M1b) non-small cell lung cancer, adenocarcinoma presented with large left diaphragmatic surface mass in  addition to left hilar, infrahilar, AP window, right and left paratracheal, subcarinal as well as left internal mammary lymphadenopathy in addition to right gastric lymph node and solitary Small right frontal calvarial osseous metastatic disease with large malignant left pleural effusion diagnosed in January 2024 with positive ROS1 fusion and has been on Repotrectinib , 160 mg twice daily, since February 2024.  She has been on Repotrectinib  (Augtyro ) 160 mg twice daily since February 2024. The drug is effective for her cancer, with no significant side effects except occasional side pain, likely due to previous fluid accumulation. Concerns about potential bone fractures as a side effect were discussed. - Continue Repotrectinib  160 mg twice daily - Report potential side effect of bone fracture to the pharmaceutical company  Bone fracture, right foot She has a healing bone fracture in the right foot, initially  misdiagnosed as a tendon issue. The fracture is slightly displaced but aligning well without surgery. Concerns about bone strength persist, and the potential contribution of Repotrectinib  was discussed. The fracture is likely due to repetitive stress and a possible pre-existing bone bruise. She is taking Vitamin D and magnesium  to aid bone health. Prolia (denosumab) injection is considered to strengthen bones, pending dental clearance due to the risk of osteonecrosis of the jaw. Informed consent includes this risk and the need for dental clearance. - Continue Vitamin D and magnesium  supplementation - Provide information on Prolia (denosumab) injection - Obtain dental clearance before considering Prolia injection - Advise on non-weight-bearing exercises to avoid stress on the foot  Anemia Anemia persists with hemoglobin at 10.6 g/dL. She is taking iron supplements, and lab results are consistent with previous findings. - Continue iron supplements   She was advised to call immediately if she has any  other concerning symptoms in the interval.  The patient voices understanding of current disease status and treatment options and is in agreement with the current care plan.  All questions were answered. The patient knows to call the clinic with any problems, questions or concerns. We can certainly see the patient much sooner if necessary.  The total time spent in the appointment was 30 minutes.  Disclaimer: This note was dictated with voice recognition software. Similar sounding words can inadvertently be transcribed and may not be corrected upon review.

## 2024-02-11 NOTE — Telephone Encounter (Signed)
 Scheduled appointments and the patient is aware of the appointment details.

## 2024-02-14 IMAGING — US US BREAST*L* LIMITED INC AXILLA
1 series · 4 of 4 positions shown · non-contrast
Comparison: Previous exam(s).

CLINICAL DATA: 36-year-old female presenting for final follow-up of
a probably benign left breast mass.

EXAM:
ULTRASOUND OF THE LEFT BREAST

[Series 1: us breast*left* limited inc axilla · 0.07mm/px · 4 of 4 slices shown]
[im 1/4]
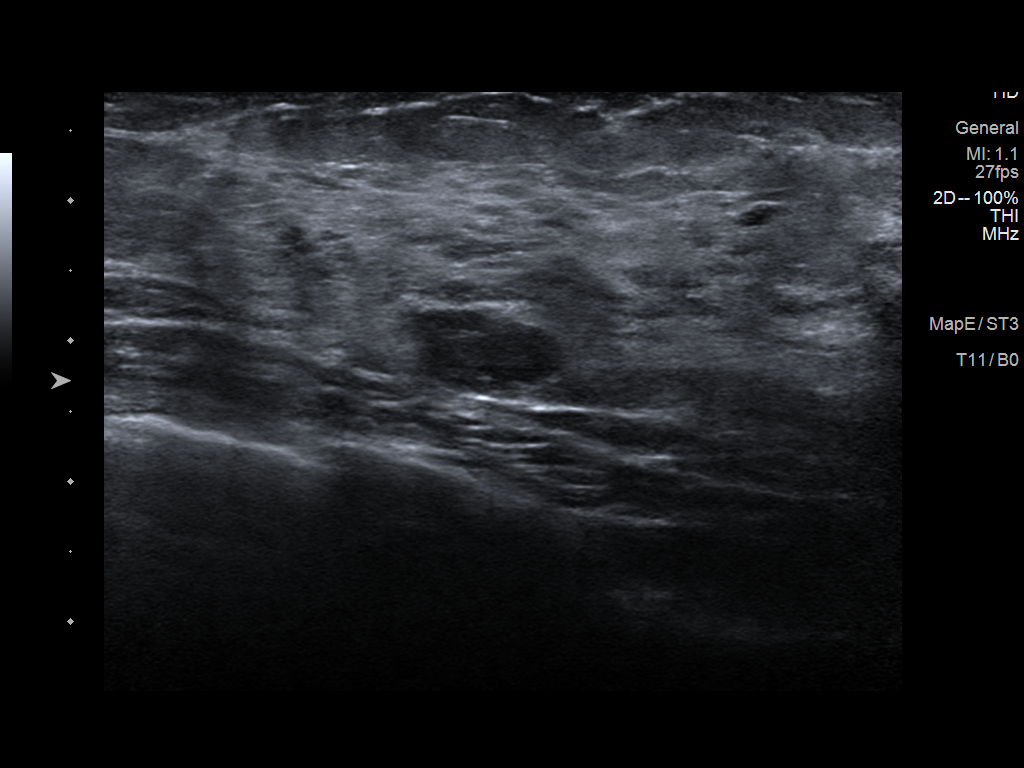
[im 2/4]
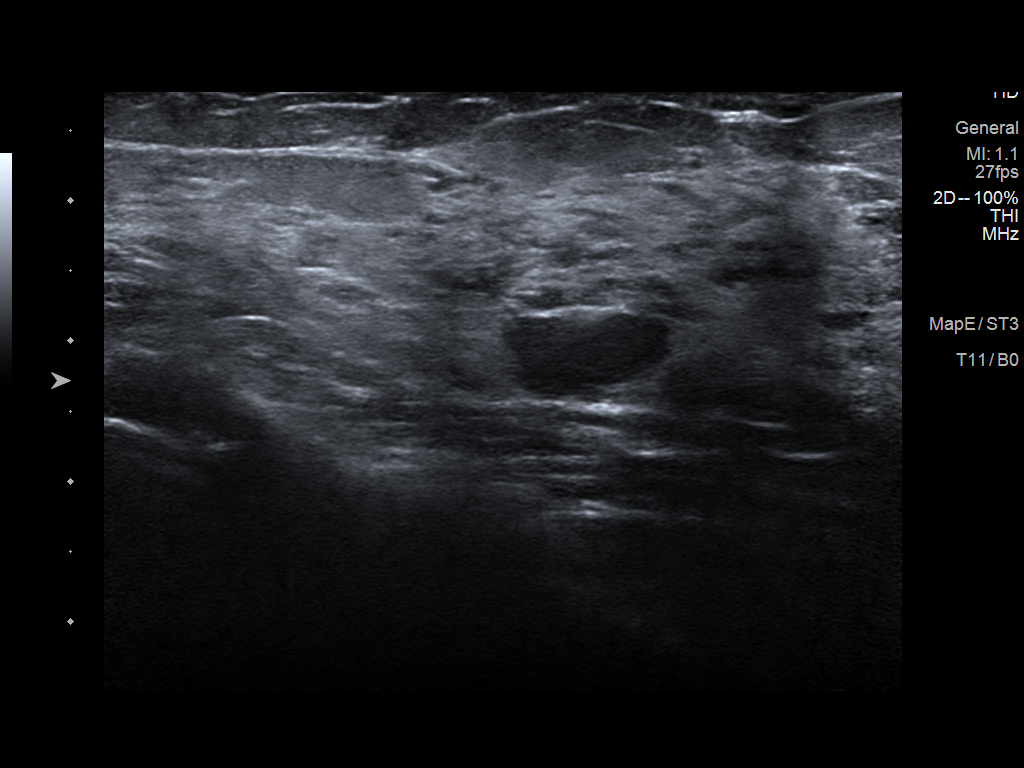
[im 3/4]
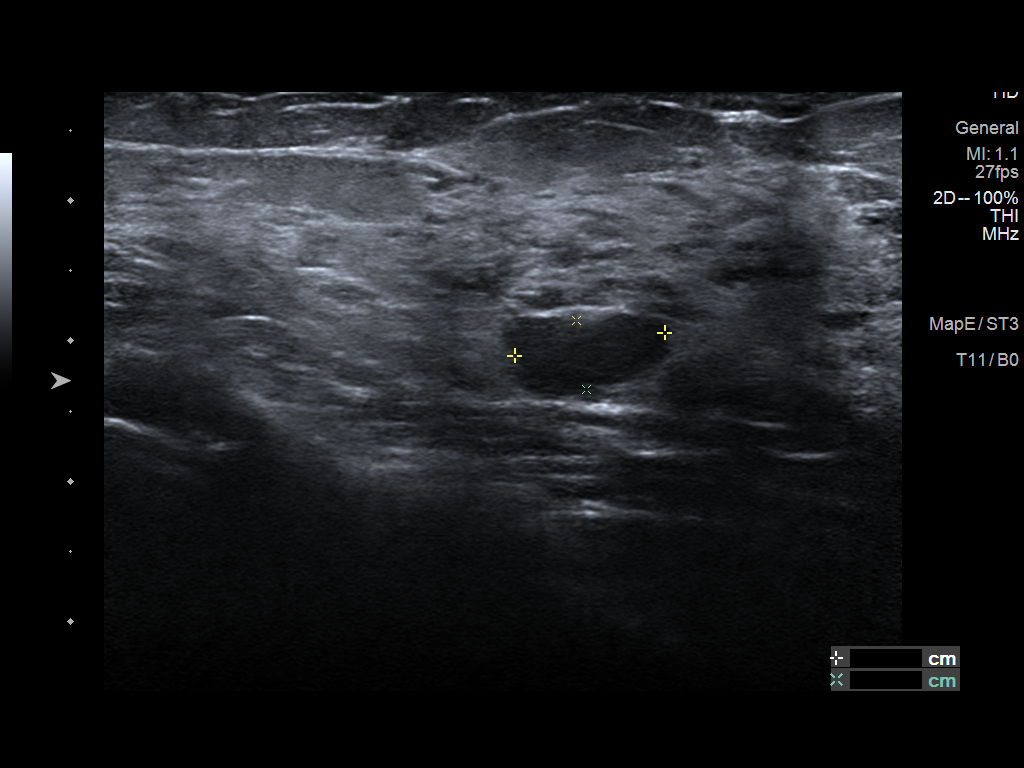
[im 4/4]
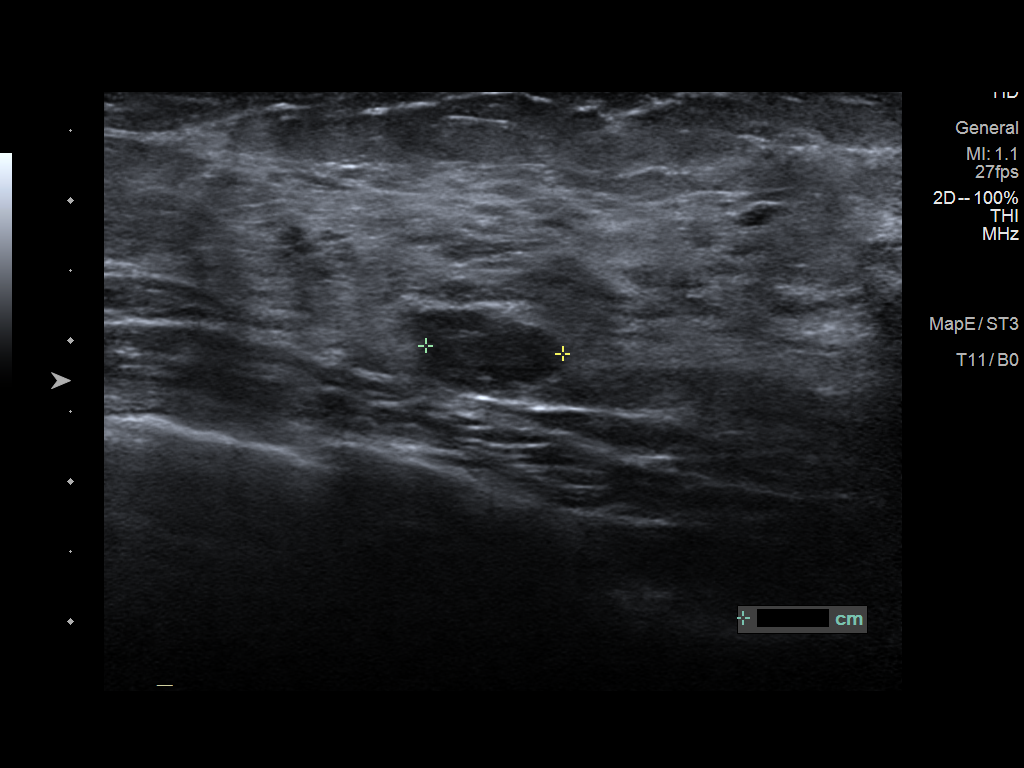

[4 of 4 positions shown; findings below may reference images not displayed]

FINDINGS: Targeted ultrasound is performed, showing stable appearance of a
circumscribed hypoechoic mass at the 10 o'clock position 1 cm from
the nipple. Today it measures 1.1 x 0.5 x 1.0 cm (previously 1.1 x
0.5 x 0.9 cm).
IMPRESSION: Benign left breast mass demonstrating greater than 2 year stability.
No further follow-up required.

RECOMMENDATION:
Screening mammogram at age 41 unless there are persistent or
intervening clinical concerns. (Code:I8-U-WTS)

I have discussed the findings and recommendations with the patient.
If applicable, a reminder letter will be sent to the patient
regarding the next appointment.

BI-RADS CATEGORY  2: Benign.

## 2024-02-27 DIAGNOSIS — S82421A Displaced transverse fracture of shaft of right fibula, initial encounter for closed fracture: Secondary | ICD-10-CM | POA: Diagnosis not present

## 2024-02-27 DIAGNOSIS — S82391A Other fracture of lower end of right tibia, initial encounter for closed fracture: Secondary | ICD-10-CM | POA: Diagnosis not present

## 2024-03-19 ENCOUNTER — Ambulatory Visit (HOSPITAL_COMMUNITY)
Admission: RE | Admit: 2024-03-19 | Discharge: 2024-03-19 | Disposition: A | Discharge status: Home / Self Care | Source: Ambulatory Visit | Attending: Vascular Surgery | Admitting: Vascular Surgery

## 2024-03-19 DIAGNOSIS — I739 Peripheral vascular disease, unspecified: Secondary | ICD-10-CM

## 2024-03-19 LAB — VAS US ABI WITH/WO TBI
Left ABI: 0.74
Right ABI: 0.86

## 2024-03-22 NOTE — Progress Notes (Signed)
 [facility] [place]  ASSESSMENT / PLAN: 38 y.o. female with subacute bilateral lower extremity ischemia causing rest pain in the left lower extremity and claudication in the right lower extremity. Likely from cardioembolism or tumor embolism to lower extremities. This was managed with successful endovascular thrombectomy 11/27/22 with resolution of rest pain.  Her claudication is stable.  She is no longer experiencing paresthesias She had a silent stroke identified in March 2024. Repeat MRI showed no new stroke.   Transesophageal echocardiogram showed no persistent thrombus in the heart.  There was a very small patent foramen ovale, which did not need treatment.  Dr. Liam Redhead, Dr. Arlester Ladd, and myself discussed the case.  We all agreed to de-escalate her anticoagulation and antithrombotic therapy to Xarelto  alone.  I will see her again in 12 months with an ankle-brachial index.  CHIEF COMPLAINT: Severe leg pain  HISTORY OF PRESENT ILLNESS: Melissa Pineda is a 38 y.o. female who was urgently added into the clinic today for evaluation of ultrasound findings.  The patient has had an unusual course.  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  She is a never smoker.  She had malignant effusion and required a Pleurx catheter.  She reports worsening leg pain over the past several days.  Prior to this, she describes leg pain like shinsplints.  She reports severe pain about the metatarsals and toes on the left, classic for ischemic rest pain.  She reports cramping discomfort in the right leg, which is bearable to her.  01/06/23: Patient returns to clinic after intervention and beginning immunotherapy.  She is doing much better overall.  She looks much more healthy.  She does describe some cramping discomfort with walking and easy fatigability.  She does not  describe rest pain.  Her symptoms are manageable for her.  Since CT scan of the chest shows good response to immunotherapy.  04/08/23: Patient returns to clinic.  She is doing very well.  She has been told she has a complete response to her directed therapy for adenocarcinoma of the lung.  She is walking is much as she likes.  She has noticed easy bruising.  She has not noticed problematic bleeding.  She does not have menorrhagia.  She seems to be tolerating triple therapy fairly well.  She did suffer a punctate stroke in March 2024.  A small speech deficit has since resolved.  She had a repeat MRI scan of the brain today looking for new infarcts.  I reviewed these findings with her.  I agree with her oncologist, continuing maximal anticoagulant and antiplatelet therapy seems reasonable for the foreseeable future.  05/12/23: Patient returns to clinic.  She reports new claudication type symptoms in both legs.  She has cramping discomfort in her proximal calves and behind the knee bilaterally.  She does not have rest pain.  She also describes paresthesias in both legs.  These occur with rest or activity.  She also describes paresthesias in the left upper extremity.  We reviewed her recent course.    07/15/23: Patient returns for surveillance.  We reviewed her TEE.  She is overall doing okay.  She has fairly stable claudication symptoms in her legs.  She can play with her child and walk, but is limited in the strenuous activity.    03/23/24:   VASCULAR SURGICAL HISTORY: endovascular thrombectomy 11/28/23.  VASCULAR RISK FACTORS: Negative history of stroke / transient ischemic attack. Negative history of coronary artery disease.  Negative history of diabetes mellitus.  Negative history of smoking. Negative history of hypertension.  Negative history of chronic kidney disease.  Negative history of chronic obstructive pulmonary disease.  FUNCTIONAL STATUS: ECOG performance status: (0) Fully active, able to  carry on all predisease performance without restriction Ambulatory status: Ambulatory within the community without limits  Past Medical History:  Diagnosis Date   Anxiety    [employer] 11/21/2020   Cancer [facility])     Past Surgical History:  Procedure Laterality Date   ABDOMINAL AORTOGRAM W/LOWER EXTREMITY N/A 11/27/2022   Procedure: ABDOMINAL AORTOGRAM W/LOWER EXTREMITY;  Surgeon: Carlene Che, MD;  Location: [facility] INVASIVE CV LAB;  Service: Cardiovascular;  Laterality: N/A;   IR PERC PLEURAL DRAIN W/INDWELL CATH W/IMG GUIDE  11/09/2022   PERIPHERAL VASCULAR ATHERECTOMY  11/27/2022   Procedure: PERIPHERAL VASCULAR ATHERECTOMY;  Surgeon: Carlene Che, MD;  Location: [facility] INVASIVE CV LAB;  Service: Cardiovascular;;   PERIPHERAL VASCULAR THROMBECTOMY  11/27/2022   Procedure: PERIPHERAL VASCULAR THROMBECTOMY;  Surgeon: Carlene Che, MD;  Location: [facility] INVASIVE CV LAB;  Service: Cardiovascular;;   REMOVAL OF PLEURAL DRAINAGE CATHETER N/A 12/17/2022   Procedure: MINOR REMOVAL OF PLEURAL DRAINAGE CATHETER;  Surgeon: Prudy Brownie, DO;  Location: [facility] ENDOSCOPY;  Service: Cardiopulmonary;  Laterality: N/A;   TEE WITHOUT CARDIOVERSION N/A 06/10/2023   Procedure: TRANSESOPHAGEAL ECHOCARDIOGRAM;  Surgeon: Maudine Sos, MD;  Location: [facility] INVASIVE CV LAB;  Service: Cardiovascular;  Laterality: N/A;   WISDOM TOOTH EXTRACTION      Family History  Problem Relation Age of Onset   Cancer Mother    Cancer Maternal Grandfather    Cancer Maternal Grandmother    Cancer Paternal Grandfather    Hypertension Paternal Grandmother     Social History   Socioeconomic History   Marital status: Married    Spouse name: Not on file   Number of children: Not on file   Years of education: Not on file   Highest education level: Not on file  Occupational History   Not on file  Tobacco Use   Smoking status: Never   Smokeless tobacco: Never  Vaping Use   Vaping status: Never Used  Substance and Sexual  Activity   Alcohol use: Yes    Alcohol/week: 7.0 standard drinks of alcohol    Types: 7 Glasses of wine per week    Comment: 1 glass wine in the evening    Drug use: Yes    Types: Marijuana    Comment: occasional   Sexual activity: Yes    Birth control/protection: I.U.D.  Other Topics Concern   Not on file  Social History Narrative   Not on file   Social Drivers of Health   Financial Resource Strain: Not on file  Food Insecurity: No Food Insecurity (11/26/2022)   Hunger Vital Sign    Worried About Running Out of Food in the Last Year: Never true    Ran Out of Food in the Last Year: Never true  Transportation Needs: No Transportation Needs (11/26/2022)   PRAPARE - Administrator, Civil Service (Medical): No    Lack of Transportation (Non-Medical): No  Physical Activity: Not on file  Stress: Not on file  Social Connections: Not on file  Intimate Partner Violence: Not At Risk (11/26/2022)   Humiliation, Afraid, Rape, and Kick questionnaire    Fear of Current or Ex-Partner: No  Emotionally Abused: No    Physically Abused: No    Sexually Abused: No    Allergies  Allergen Reactions   Hydrocodone  Nausea Only    Dizzy, "crawl out of my skin"   Tramadol  Other (See Comments)    "Weird feeling"   Iodinated Contrast Media Hives    Current Outpatient Medications  Medication Sig Dispense Refill   acetaminophen  (TYLENOL ) 325 MG tablet Take 650 mg by mouth every 6 (six) hours as needed for mild pain.     ALPRAZolam  (XANAX ) 0.5 MG tablet Take 1 tablet (0.5 mg total) by mouth 3 (three) times daily as needed for anxiety. 60 tablet 0   ascorbic acid (VITAMIN C) 500 MG tablet Take 500 mg by mouth daily.     aspirin  EC 81 MG tablet Take 1 tablet (81 mg total) by mouth daily. Swallow whole. 30 tablet 12   b complex vitamins capsule Take 1 capsule by mouth daily.     Cholecalciferol (VITAMIN D-3 PO) Take 2,000 Units by mouth daily.     clopidogrel  (PLAVIX ) 75 MG tablet Take 1  tablet (75 mg total) by mouth daily. 30 tablet 11   Ferrous Sulfate (IRON HIGH-POTENCY) 142 (45 Fe) MG TBCR Take 142 mg by mouth daily.     levonorgestrel  (MIRENA , 52 MG,) 20 MCG/DAY IUD 1 each by Intrauterine route once.     metoCLOPramide  (REGLAN ) 5 MG tablet Take 1 tablet (5 mg total) by mouth every 6 (six) hours as needed for nausea. 30 tablet 1   ondansetron  (ZOFRAN ) 8 MG tablet Take 1 tablet (8 mg total) by mouth every 8 (eight) hours as needed for nausea or vomiting. 20 tablet 0   ondansetron  (ZOFRAN -ODT) 8 MG disintegrating tablet Take 1 tablet (8 mg total) by mouth every 8 (eight) hours as needed for nausea or vomiting. 20 tablet 1   Ondansetron  4 MG FILM Take 4-8 mg by mouth every 6 (six) hours as needed. 15 each 0   pantoprazole  (PROTONIX ) 40 MG tablet Take 1 tablet (40 mg total) by mouth daily. 30 tablet 3   polyethylene glycol (MIRALAX  / GLYCOLAX ) 17 g packet Take 17 g by mouth daily as needed for moderate constipation.     predniSONE  (DELTASONE ) 50 MG tablet Take 1 tablet (50 mg total) by mouth as directed. Take one tablet 13 hours , 1 tablet 7 hours and 1 tablet 1 hour prior to CT scan with IV contrast.  Take Benadryl  50 mg po 1 hour before CT scan (Patient not taking: Reported on 11/14/2023) 3 tablet 5   repotrectinib  (AUGTYRO ) 40 MG capsule Take 4 capsules (160 mg total) by mouth 2 (two) times daily. 240 capsule 2   XARELTO  20 MG TABS tablet TAKE 1 TABLET(20 MG) BY MOUTH DAILY WITH SUPPER 90 tablet 1   Zinc 22.5 MG TABS Take 22.5 mg by mouth daily.     No current facility-administered medications for this visit.    PHYSICAL EXAM Vitals:   03/23/24 1450  BP: 109/66  Pulse: 80  Temp: 98 F (36.7 C)  TempSrc: Temporal  SpO2: 98%  Weight: 177 lb 12.8 oz (80.6 kg)  Height: 5\' 6"  (1.676 m)    Young woman.  Regular rate and rhythm Bilateral feet are warm No palpable pedal pulses.    PERTINENT LABORATORY AND RADIOLOGIC DATA  Most recent CBC    Latest Ref Rng & Units  02/11/2024    9:06 AM 12/02/2023    3:57 PM 10/29/2023  8:34 AM  CBC  WBC 4.0 - 10.5 K/uL 5.5  8.7  5.7   Hemoglobin 12.0 - 15.0 g/dL 40.9  81.1  91.4   Hematocrit 36.0 - 46.0 % 32.4  33.8  31.9   Platelets 150 - 400 K/uL 450  641  496      Most recent CMP    Latest Ref Rng & Units 02/11/2024    9:06 AM 12/02/2023    3:57 PM 10/29/2023    8:34 AM  CMP  Glucose 70 - 99 mg/dL 782  956  213   BUN 6 - 20 mg/dL 16  13  17    Creatinine 0.44 - 1.00 mg/dL 0.86  5.78  4.69   Sodium 135 - 145 mmol/L 140  138  138   Potassium 3.5 - 5.1 mmol/L 4.0  3.8  3.8   Chloride 98 - 111 mmol/L 107  103  104   CO2 22 - 32 mmol/L 28  27  28    Calcium 8.9 - 10.3 mg/dL 8.8  9.6  9.1   Total Protein 6.5 - 8.1 g/dL 7.2  8.2  7.3   Total Bilirubin 0.0 - 1.2 mg/dL 0.4  0.3  0.4   Alkaline Phos 38 - 126 U/L 91  123  69   AST 15 - 41 U/L 18  16  18    ALT 0 - 44 U/L 15  13  13      Renal function CrCl cannot be calculated (Patient's most recent lab result is older than the maximum 21 days allowed.).  Hgb A1c MFr Bld (%)  Date Value  11/24/2020 5.0    LDL Chol Calc (NIH)  Date Value Ref Range Status  11/24/2020 89 0 - 99 mg/dL Final   LDL Cholesterol  Date Value Ref Range Status  11/28/2022 104 (H) 0 - 99 mg/dL Final    Comment:           Total Cholesterol/HDL:CHD Risk Coronary Heart Disease Risk Table                     Men   Women  1/2 Average Risk   3.4   3.3  Average Risk       5.0   4.4  2 X Average Risk   9.6   7.1  3 X Average Risk  23.4   11.0        Use the calculated Patient Ratio above and the CHD Risk Table to determine the patient's CHD Risk.        ATP III CLASSIFICATION (LDL):  <100     mg/dL   Optimal  629-528  mg/dL   Near or Above                    Optimal  130-159  mg/dL   Borderline  413-244  mg/dL   High  >010     mg/dL   Very High Performed at [facility], [address] [place], [place] [address]      Vascular  Imaging:  +-------+-----------+-----------+------------+------------+  ABI/TBIToday's ABIToday's TBIPrevious ABIPrevious TBI  +-------+-----------+-----------+------------+------------+  Right 0.86       0.62       0.84        0.58          +-------+-----------+-----------+------------+------------+  Left  0.74       0.83       0.79  0.68          +-------+-----------+-----------+------------+------------+    Heber Little. Edgardo Goodwill, MD [facility] Phone Number: [phone] 03/23/2024 4:56 PM   Total time spent on preparing this encounter including chart review, data review, collecting history, examining the patient, coordinating care for this patient, 30 minutes.  Portions of this report may have been transcribed using voice recognition software.  Every effort has been made to ensure accuracy; however, inadvertent computerized transcription errors may still be present.

## 2024-03-23 ENCOUNTER — Encounter: Payer: Self-pay | Admitting: Vascular Surgery

## 2024-03-23 ENCOUNTER — Ambulatory Visit: Attending: Vascular Surgery | Admitting: Vascular Surgery

## 2024-03-23 VITALS — BP 109/66 | HR 80 | Temp 98.0°F | Ht 66.0 in | Wt 177.8 lb

## 2024-03-23 DIAGNOSIS — I739 Peripheral vascular disease, unspecified: Secondary | ICD-10-CM

## 2024-03-27 ENCOUNTER — Other Ambulatory Visit: Payer: Self-pay | Admitting: Physician Assistant

## 2024-03-27 DIAGNOSIS — C3492 Malignant neoplasm of unspecified part of left bronchus or lung: Secondary | ICD-10-CM

## 2024-03-29 DIAGNOSIS — S82421A Displaced transverse fracture of shaft of right fibula, initial encounter for closed fracture: Secondary | ICD-10-CM | POA: Diagnosis not present

## 2024-03-29 DIAGNOSIS — S82391A Other fracture of lower end of right tibia, initial encounter for closed fracture: Secondary | ICD-10-CM | POA: Diagnosis not present

## 2024-03-30 ENCOUNTER — Other Ambulatory Visit: Payer: Self-pay | Admitting: Medical Oncology

## 2024-03-30 DIAGNOSIS — T508X5S Adverse effect of diagnostic agents, sequela: Secondary | ICD-10-CM

## 2024-03-30 MED ORDER — PREDNISONE 50 MG PO TABS: 50.0000 mg | ORAL_TABLET | ORAL | 5 refills | Status: DC

## 2024-03-30 NOTE — Telephone Encounter (Signed)
 Refill prednisone  pre CT scan.

## 2024-04-02 ENCOUNTER — Ambulatory Visit (HOSPITAL_COMMUNITY)
Admission: RE | Admit: 2024-04-02 | Discharge: 2024-04-02 | Disposition: A | Discharge status: Home / Self Care | Source: Ambulatory Visit | Attending: Internal Medicine | Admitting: Internal Medicine

## 2024-04-02 ENCOUNTER — Other Ambulatory Visit: Payer: Self-pay | Admitting: Internal Medicine

## 2024-04-02 ENCOUNTER — Inpatient Hospital Stay: Attending: Internal Medicine

## 2024-04-02 DIAGNOSIS — C3490 Malignant neoplasm of unspecified part of unspecified bronchus or lung: Secondary | ICD-10-CM

## 2024-04-02 DIAGNOSIS — C7951 Secondary malignant neoplasm of bone: Secondary | ICD-10-CM | POA: Diagnosis not present

## 2024-04-02 DIAGNOSIS — C3492 Malignant neoplasm of unspecified part of left bronchus or lung: Secondary | ICD-10-CM | POA: Insufficient documentation

## 2024-04-02 DIAGNOSIS — K820 Obstruction of gallbladder: Secondary | ICD-10-CM | POA: Diagnosis not present

## 2024-04-02 DIAGNOSIS — J90 Pleural effusion, not elsewhere classified: Secondary | ICD-10-CM | POA: Diagnosis not present

## 2024-04-02 DIAGNOSIS — K5900 Constipation, unspecified: Secondary | ICD-10-CM | POA: Diagnosis not present

## 2024-04-02 DIAGNOSIS — D649 Anemia, unspecified: Secondary | ICD-10-CM | POA: Insufficient documentation

## 2024-04-02 DIAGNOSIS — Z7982 Long term (current) use of aspirin: Secondary | ICD-10-CM | POA: Insufficient documentation

## 2024-04-02 DIAGNOSIS — N2889 Other specified disorders of kidney and ureter: Secondary | ICD-10-CM | POA: Diagnosis not present

## 2024-04-02 DIAGNOSIS — Z79899 Other long term (current) drug therapy: Secondary | ICD-10-CM | POA: Insufficient documentation

## 2024-04-02 LAB — CBC WITH DIFFERENTIAL (CANCER CENTER ONLY)
Abs Immature Granulocytes: 0.02 10*3/uL (ref 0.00–0.07)
Basophils Absolute: 0 10*3/uL (ref 0.0–0.1)
Basophils Relative: 0 %
Eosinophils Absolute: 0 10*3/uL (ref 0.0–0.5)
Eosinophils Relative: 0 %
HCT: 32 % — ABNORMAL LOW (ref 36.0–46.0)
Hemoglobin: 10.8 g/dL — ABNORMAL LOW (ref 12.0–15.0)
Immature Granulocytes: 0 %
Lymphocytes Relative: 12 %
Lymphs Abs: 0.8 10*3/uL (ref 0.7–4.0)
MCH: 30.3 pg (ref 26.0–34.0)
MCHC: 33.8 g/dL (ref 30.0–36.0)
MCV: 89.9 fL (ref 80.0–100.0)
Monocytes Absolute: 0.1 10*3/uL (ref 0.1–1.0)
Monocytes Relative: 1 %
Neutro Abs: 6 10*3/uL (ref 1.7–7.7)
Neutrophils Relative %: 87 %
Platelet Count: 600 10*3/uL — ABNORMAL HIGH (ref 150–400)
RBC: 3.56 MIL/uL — ABNORMAL LOW (ref 3.87–5.11)
RDW: 12.8 % (ref 11.5–15.5)
WBC Count: 6.9 10*3/uL (ref 4.0–10.5)
nRBC: 0 % (ref 0.0–0.2)

## 2024-04-02 LAB — CMP (CANCER CENTER ONLY)
ALT: 23 U/L (ref 0–44)
AST: 23 U/L (ref 15–41)
Albumin: 4.9 g/dL (ref 3.5–5.0)
Alkaline Phosphatase: 119 U/L (ref 38–126)
Anion gap: 10 (ref 5–15)
BUN: 15 mg/dL (ref 6–20)
CO2: 26 mmol/L (ref 22–32)
Calcium: 9.6 mg/dL (ref 8.9–10.3)
Chloride: 106 mmol/L (ref 98–111)
Creatinine: 0.98 mg/dL (ref 0.44–1.00)
GFR, Estimated: >60 mL/min (ref >60)
Glucose, Bld: 188 mg/dL — ABNORMAL HIGH (ref 70–99)
Potassium: 4.1 mmol/L (ref 3.5–5.1)
Sodium: 142 mmol/L (ref 135–145)
Total Bilirubin: 0.3 mg/dL (ref 0.0–1.2)
Total Protein: 8.1 g/dL (ref 6.5–8.1)

## 2024-04-02 LAB — CK: Total CK: 295 U/L — ABNORMAL HIGH (ref 38–234)

## 2024-04-02 MED ORDER — IOHEXOL 300 MG/ML  SOLN
80.0000 mL | Freq: Once | INTRAMUSCULAR | Status: AC | PRN
  Administered 2024-04-02: 80 mL via INTRAVENOUS

## 2024-04-12 ENCOUNTER — Inpatient Hospital Stay (HOSPITAL_BASED_OUTPATIENT_CLINIC_OR_DEPARTMENT_OTHER): Admitting: Internal Medicine

## 2024-04-12 VITALS — BP 127/74 | HR 88 | Temp 98.2°F | Resp 17 | Ht 66.0 in | Wt 185.5 lb

## 2024-04-12 DIAGNOSIS — Z79899 Other long term (current) drug therapy: Secondary | ICD-10-CM | POA: Diagnosis not present

## 2024-04-12 DIAGNOSIS — D649 Anemia, unspecified: Secondary | ICD-10-CM | POA: Diagnosis not present

## 2024-04-12 DIAGNOSIS — C3492 Malignant neoplasm of unspecified part of left bronchus or lung: Secondary | ICD-10-CM | POA: Diagnosis not present

## 2024-04-12 DIAGNOSIS — K5900 Constipation, unspecified: Secondary | ICD-10-CM | POA: Diagnosis not present

## 2024-04-12 DIAGNOSIS — Z7982 Long term (current) use of aspirin: Secondary | ICD-10-CM | POA: Diagnosis not present

## 2024-04-12 DIAGNOSIS — C7951 Secondary malignant neoplasm of bone: Secondary | ICD-10-CM | POA: Diagnosis not present

## 2024-04-12 NOTE — Progress Notes (Signed)
 [facility] [phone]   [phone]  OFFICE PROGRESS NOTE  Patient, No Pcp Per No address on file  DIAGNOSIS: Stage IVB (T4, N3, M1b) non-small cell lung cancer, adenocarcinoma presented with large left diaphragmatic surface mass in addition to left hilar, infrahilar, AP window, right and left paratracheal, subcarinal as well as left internal mammary lymphadenopathy in addition to right gastric lymph node and solitary Small right frontal calvarial osseous metastatic disease with large malignant left pleural effusion diagnosed in January 2024.    Molecular studies by [employer] as well as [employer] blood test showed:   EZR-ROS1 fusion approved by FDA Crizotinib, Entrectinib, Repotrectinib    approved in other indication Ceritinib, Lorlatinib Yes      3.6%   PD-L1 expression by foundation [employer] that was 98%.   PRIOR THERAPY: Status post left Pleurx catheter placement for drainage of recurrent left pleural effusion.  This was removed on December 17, 2022.  CURRENT THERAPY: Repotrectinib  (Augtyro ) 160 mg p.o. daily for the first 2 weeks and then 160 mg p.o. twice daily, first dose November 27, 2022.  She is status post 17 months of treatment  INTERVAL HISTORY: Tomorrow [place] 38 y.o. female returns to the clinic today for follow-up visit accompanied by her mother.Discussed the use of AI scribe software for clinical note transcription with the patient, who gave verbal consent to proceed.  History of Present Illness   Janaya Broy is a 38 year old female with stage four non-small cell lung cancer who presents for evaluation and repeat CT scan for restaging of her disease. She is accompanied by her mother.  Diagnosed with stage four non-small cell lung cancer, adenocarcinoma, in January 2024, characterized by positive ROS fusion and PD-L1 expression of 98%. Underwent left pleural catheter placement with drainage of recurrent left pleural effusion, removed  in February 2024. Started treatment with repotrectinib  (Augtyro ) on November 27, 2022, and has been on this treatment for the last seven months.  Experiences joint pain, particularly in her hands and sometimes in her toes, affecting her extremities. Additionally, has a stress fracture in her foot, which is healing but still requires support. Was supposed to transition out of the boot but continues to experience discomfort and plans to have more x-rays to assess the healing process. Wears sneakers with good arch support but has been using the boot for additional support.  Has experienced weight gain, which she attributes to her medication, despite maintaining a healthy diet and attempting to exercise. Her job is sedentary, which may contribute to the weight gain.  Reports high creatine levels in her labs, which she attributes to muscle inflammation. Blood sugar was also noted to be high, but she was not fasting at the time of the test.  Experiences fatigue, which she attributes to anemia. Is considering taking a couple of weeks off work due to fatigue. Currently working full-time and has a child, which contributes to her tiredness.  Reports constipation and is considering using stool softeners for relief. Is cautious about mixing new medications with her current treatment.       MEDICAL HISTORY: Past Medical History:  Diagnosis Date   Anxiety    [employer] 11/21/2020   Cancer (HCC)     ALLERGIES:  is allergic to hydrocodone , tramadol , and iodinated contrast media.  MEDICATIONS:  Current Outpatient Medications  Medication Sig Dispense Refill   acetaminophen  (TYLENOL ) 325 MG tablet Take 650 mg by mouth every 6 (six) hours as needed for  mild pain.     ALPRAZolam  (XANAX ) 0.5 MG tablet Take 1 tablet (0.5 mg total) by mouth 3 (three) times daily as needed for anxiety. 60 tablet 0   ascorbic acid (VITAMIN C) 500 MG tablet Take 500 mg by mouth daily.     aspirin  EC 81 MG tablet Take 1 tablet (81 mg  total) by mouth daily. Swallow whole. 30 tablet 12   AUGTYRO  40 MG capsule TAKE 4 CAPSULES TWO TIMES DAILY 240 capsule 2   b complex vitamins capsule Take 1 capsule by mouth daily.     Cholecalciferol (VITAMIN D-3 PO) Take 2,000 Units by mouth daily.     clopidogrel  (PLAVIX ) 75 MG tablet Take 1 tablet (75 mg total) by mouth daily. 30 tablet 11   Ferrous Sulfate (IRON HIGH-POTENCY) 142 (45 Fe) MG TBCR Take 142 mg by mouth daily.     levonorgestrel  (MIRENA , 52 MG,) 20 MCG/DAY IUD 1 each by Intrauterine route once.     metoCLOPramide  (REGLAN ) 5 MG tablet Take 1 tablet (5 mg total) by mouth every 6 (six) hours as needed for nausea. 30 tablet 1   ondansetron  (ZOFRAN ) 8 MG tablet Take 1 tablet (8 mg total) by mouth every 8 (eight) hours as needed for nausea or vomiting. 20 tablet 0   ondansetron  (ZOFRAN -ODT) 8 MG disintegrating tablet Take 1 tablet (8 mg total) by mouth every 8 (eight) hours as needed for nausea or vomiting. 20 tablet 1   Ondansetron  4 MG FILM Take 4-8 mg by mouth every 6 (six) hours as needed. 15 each 0   pantoprazole  (PROTONIX ) 40 MG tablet Take 1 tablet (40 mg total) by mouth daily. 30 tablet 3   polyethylene glycol (MIRALAX  / GLYCOLAX ) 17 g packet Take 17 g by mouth daily as needed for moderate constipation.     predniSONE  (DELTASONE ) 50 MG tablet Take 1 tablet (50 mg total) by mouth as directed. Take one tablet 13 hours , 1 tablet 7 hours and 1 tablet 1 hour prior to CT scan with IV contrast. Take Benadryl  50 mg po 1 hour before CT scan 3 tablet 5   XARELTO  20 MG TABS tablet TAKE 1 TABLET(20 MG) BY MOUTH DAILY WITH SUPPER 90 tablet 1   Zinc 22.5 MG TABS Take 22.5 mg by mouth daily.     No current facility-administered medications for this visit.    SURGICAL HISTORY:  Past Surgical History:  Procedure Laterality Date   ABDOMINAL AORTOGRAM W/LOWER EXTREMITY N/A 11/27/2022   Procedure: ABDOMINAL AORTOGRAM W/LOWER EXTREMITY;  Surgeon: Magda Debby SAILOR, MD;  Location: [facility] INVASIVE CV  LAB;  Service: Cardiovascular;  Laterality: N/A;   IR PERC PLEURAL DRAIN W/INDWELL CATH W/IMG GUIDE  11/09/2022   PERIPHERAL VASCULAR ATHERECTOMY  11/27/2022   Procedure: PERIPHERAL VASCULAR ATHERECTOMY;  Surgeon: Magda Debby SAILOR, MD;  Location: [facility] INVASIVE CV LAB;  Service: Cardiovascular;;   PERIPHERAL VASCULAR THROMBECTOMY  11/27/2022   Procedure: PERIPHERAL VASCULAR THROMBECTOMY;  Surgeon: Magda Debby SAILOR, MD;  Location: [facility] INVASIVE CV LAB;  Service: Cardiovascular;;   REMOVAL OF PLEURAL DRAINAGE CATHETER N/A 12/17/2022   Procedure: MINOR REMOVAL OF PLEURAL DRAINAGE CATHETER;  Surgeon: Brenna Adine CROME, DO;  Location: [facility] ENDOSCOPY;  Service: Cardiopulmonary;  Laterality: N/A;   TEE WITHOUT CARDIOVERSION N/A 06/10/2023   Procedure: TRANSESOPHAGEAL ECHOCARDIOGRAM;  Surgeon: Raford Riggs, MD;  Location: [facility] INVASIVE CV LAB;  Service: Cardiovascular;  Laterality: N/A;   WISDOM TOOTH EXTRACTION      REVIEW OF SYSTEMS:  Constitutional: positive  for fatigue Eyes: negative Ears, nose, mouth, throat, and face: negative Respiratory: negative Cardiovascular: negative Gastrointestinal: positive for constipation Genitourinary:negative Integument/breast: negative Hematologic/lymphatic: negative Musculoskeletal:positive for right foot pain Neurological: negative Behavioral/Psych: negative Endocrine: negative Allergic/Immunologic: negative   PHYSICAL EXAMINATION: General appearance: alert, cooperative, fatigued, and no distress Head: Normocephalic, without obvious abnormality, atraumatic Neck: no adenopathy, no JVD, supple, symmetrical, trachea midline, and thyroid  not enlarged, symmetric, no tenderness/mass/nodules Lymph nodes: Cervical, supraclavicular, and axillary nodes normal. Resp: clear to auscultation bilaterally Back: symmetric, no curvature. ROM normal. No CVA tenderness. Cardio: regular rate and rhythm, S1, S2 normal, no murmur, click, rub or gallop GI: soft, non-tender; bowel sounds  normal; no masses,  no organomegaly Extremities: extremities normal, atraumatic, no cyanosis or edema Neurologic: Alert and oriented X 3, normal strength and tone. Normal symmetric reflexes. Normal coordination and gait  ECOG PERFORMANCE STATUS: 1 - Symptomatic but completely ambulatory  Blood pressure 127/74, pulse 88, temperature 98.2 F (36.8 C), temperature source Temporal, resp. rate 17, height 5' 6 (1.676 m), weight 185 lb 8 oz (84.1 kg), SpO2 98%.  LABORATORY DATA: Lab Results  Component Value Date   WBC 6.9 04/02/2024   HGB 10.8 (L) 04/02/2024   HCT 32.0 (L) 04/02/2024   MCV 89.9 04/02/2024   PLT 600 (H) 04/02/2024      Chemistry      Component Value Date/Time   NA 142 04/02/2024 1603   NA 142 05/26/2023 0938   K 4.1 04/02/2024 1603   CL 106 04/02/2024 1603   CO2 26 04/02/2024 1603   BUN 15 04/02/2024 1603   BUN 17 05/26/2023 0938   CREATININE 0.98 04/02/2024 1603      Component Value Date/Time   CALCIUM 9.6 04/02/2024 1603   ALKPHOS 119 04/02/2024 1603   AST 23 04/02/2024 1603   ALT 23 04/02/2024 1603   BILITOT 0.3 04/02/2024 1603       RADIOGRAPHIC STUDIES: CT CHEST ABDOMEN PELVIS W CONTRAST Result Date: 04/04/2024 CLINICAL DATA:  Non-small cell lung cancer restaging * Tracking Code: BO * EXAM: CT CHEST, ABDOMEN, AND PELVIS WITH CONTRAST TECHNIQUE: Multidetector CT imaging of the chest, abdomen and pelvis was performed following the standard protocol during bolus administration of intravenous contrast. RADIATION DOSE REDUCTION: This exam was performed according to the departmental dose-optimization program which includes automated exposure control, adjustment of the mA and/or kV according to patient size and/or use of iterative reconstruction technique. CONTRAST:  80mL OMNIPAQUE  IOHEXOL  300 MG/ML  SOLN COMPARISON:  12/02/2023 FINDINGS: CT CHEST FINDINGS Cardiovascular: No significant vascular findings. Normal heart size. No pericardial effusion.  Mediastinum/Nodes: No enlarged mediastinal, hilar, or axillary lymph nodes. Thymic remnant in the anterior mediastinum. Thyroid  gland, trachea, and esophagus demonstrate no significant findings. Lungs/Pleura: Unchanged post treatment appearance of the left lung base, with irregular residua of an infrahilar left lung mass, interlobular septal thickening, and a trace, loculated left pleural effusion (series 6, image 117). Musculoskeletal: No chest wall abnormality. No acute osseous findings. CT ABDOMEN PELVIS FINDINGS Hepatobiliary: No solid liver abnormality is seen. Contracted gallbladder. No gallstones, gallbladder wall thickening, or biliary dilatation. Pancreas: Unremarkable. No pancreatic ductal dilatation or surrounding inflammatory changes. Spleen: Normal in size without significant abnormality. Adrenals/Urinary Tract: Adrenal glands are unremarkable. Unchanged mild bilateral caliectasis (series 2, image 68). Kidneys are otherwise normal, without renal calculi, solid lesion, or overt hydronephrosis. Bladder is unremarkable. Stomach/Bowel: Stomach is within normal limits. Appendix appears normal. No evidence of bowel wall thickening, distention, or inflammatory changes. Large burden of stool  throughout the colon and rectum. Vascular/Lymphatic: No significant vascular findings are present. No enlarged abdominal or pelvic lymph nodes. Reproductive: No mass or other abnormality. IUD present in the fundal endometrial cavity. Small bilateral ovarian follicles. Other: No abdominal wall hernia or abnormality. No ascites. Musculoskeletal: No acute osseous findings. IMPRESSION: 1. Unchanged post treatment appearance of the left lung base, with irregular residua of an infrahilar left lung mass, interlobular septal thickening, and a trace, loculated left pleural effusion. 2. No evidence of lymphadenopathy or metastatic disease in the abdomen or pelvis. Electronically Signed   By: Marolyn JONETTA Jaksch M.D.   On: 04/04/2024 06:43    VAS US  ABI WITH/WO TBI Result Date: 03/19/2024  LOWER EXTREMITY DOPPLER STUDY Patient Name:  SATORIA DUNLOP  Date of Exam:   03/19/2024 Medical Rec #: [id number]     Accession #:    [id number] Date of Birth: 07-05-1986      Patient Gender: F Patient Age:   22 years Exam Location:  [address] Procedure:      VAS US  ABI WITH/WO TBI Referring Phys: DEBBY ROBERTSON --------------------------------------------------------------------------------  Indications: Peripheral artery disease.  Vascular Interventions: 11/27/22: mechanical thrombectomy of left profunda femoris                         artery                         mechanical.thrombectomy of left popliteal artery.                         Left ATA laser atherectomy and angioplasty. Performing Technologist: Geni Lodge RVS, RCS  Examination Guidelines: A complete evaluation includes at minimum, Doppler waveform signals and systolic blood pressure reading at the level of bilateral brachial, anterior tibial, and posterior tibial arteries, when vessel segments are accessible. Bilateral testing is considered an integral part of a complete examination. Photoelectric Plethysmograph (PPG) waveforms and toe systolic pressure readings are included as required and additional duplex testing as needed. Limited examinations for reoccurring indications may be performed as noted.  ABI Findings: +---------+------------------+-----+--------+--------+ Right    Rt Pressure (mmHg)IndexWaveformComment  +---------+------------------+-----+--------+--------+ Brachial 138                                     +---------+------------------+-----+--------+--------+ PTA      119               0.86 biphasic         +---------+------------------+-----+--------+--------+ DP       115               0.83 biphasic         +---------+------------------+-----+--------+--------+ Great Toe85                0.62                   +---------+------------------+-----+--------+--------+ +---------+------------------+-----+--------+-------+ Left     Lt Pressure (mmHg)IndexWaveformComment +---------+------------------+-----+--------+-------+ Brachial 127                                    +---------+------------------+-----+--------+-------+ PTA      102               0.74 biphasic        +---------+------------------+-----+--------+-------+  DP       102               0.74 biphasic        +---------+------------------+-----+--------+-------+ Great Toe115               0.83                 +---------+------------------+-----+--------+-------+ +-------+-----------+-----------+------------+------------+ ABI/TBIToday's ABIToday's TBIPrevious ABIPrevious TBI +-------+-----------+-----------+------------+------------+ Right  0.86       0.62       0.84        0.58         +-------+-----------+-----------+------------+------------+ Left   0.74       0.83       0.79        0.68         +-------+-----------+-----------+------------+------------+  Bilateral ABIs appear essentially unchanged compared to prior study on 11/14/2023.  Summary: Right: Resting right ankle-brachial index indicates mild right lower extremity arterial disease. The right toe-brachial index is abnormal. Left: Resting left ankle-brachial index indicates moderate left lower extremity arterial disease. The left toe-brachial index is abnormal. *See table(s) above for measurements and observations.  Electronically signed by Norman Serve on 03/19/2024 at 1:49:32 PM.    Final      ASSESSMENT AND PLAN: This is a very pleasant 38 years old white female with  Stage IVB (T4, N3, M1b) non-small cell lung cancer, adenocarcinoma presented with large left diaphragmatic surface mass in addition to left hilar, infrahilar, AP window, right and left paratracheal, subcarinal as well as left internal mammary lymphadenopathy in addition to right gastric  lymph node and solitary small right frontal calvarial osseous metastatic disease with large malignant left pleural effusion diagnosed in January 2024.   Fortunately her molecular studies showed positive ROS1 fusion.  The patient also had PD-L1 expression of 98%. The patient is currently undergoing treatment was started therapy with repotrectinib  started November 27, 2022.  Status post 17 months of treatment and has been tolerating it fairly well. She had repeat CT scan of the chest, abdomen and pelvis performed recently.  I personally and independently reviewed the scan and discussed the results with the patient and her mother. Her scan showed no concerning findings for disease progression. Assessment and Plan    Stage 4 non-small cell lung cancer Stage 4 non-small cell lung cancer, adenocarcinoma, with positive ROS fusion and PD-L1 expression of 98%. Currently on repotrectinib  since February 2024. Recent CT scan shows no new growths. Discussed potential future use of Taletrectinib as a second-line treatment if necessary due to toxicity or disease progression. Current treatment is well-tolerated. Taletrectinib shows a 90% response rate with gastrointestinal symptoms as the main side effects, making it a viable second-line option if needed. - Continue repotrectinib  - Monitor for disease progression or treatment toxicity - Consider Taletrectinib as a second-line treatment if necessary  Anemia Anemia contributing to fatigue. Managing symptoms but still experiences tiredness. Discussed importance of managing anemia to improve energy levels. Considering taking time off work to manage fatigue and anemia. - Consider taking time off work to manage fatigue and anemia - Monitor hemoglobin levels  Stress fracture of foot Stress fracture in the foot, healing but still requiring support. Reports pain and is using a boot. Imaging shows bone growth and healing consistent with normal healing. Discussed potential  for stress fractures as a side effect of current medication and advised on supportive footwear. Advised to avoid high-impact activities and consider more cushioning footwear to prevent further  stress fractures. - Continue using supportive footwear and boot as needed - Obtain additional x-rays to assess healing progress - Avoid high-impact activities  Weight gain due to medication Weight gain likely due to medication side effects. Discussed potential for weight gain with current treatment and importance of diet and exercise in managing weight. Concerned about weight gain despite a historically high metabolism and a sedentary job. Weight gain expected to level out with dietary management and exercise. - Encourage regular exercise within limits - Monitor dietary intake  Elevated creatine kinase Elevated creatine kinase levels likely due to muscle inflammation from medication. Discussed importance of hydration to manage CK levels and potential for exercise to influence CK levels. Advised to increase hydration. - Increase hydration - Monitor CK levels, especially after exercise  Constipation Constipation likely related to medication. Prefers dietary management. Discussed increasing dietary fiber intake through fruits and vegetables as a preferred method of management. - Consider using stool softeners as needed - Increase dietary fiber intake   The patient was advised to call immediately if she has any other concerning symptoms in the interval. The patient voices understanding of current disease status and treatment options and is in agreement with the current care plan.  All questions were answered. The patient knows to call the clinic with any problems, questions or concerns. We can certainly see the patient much sooner if necessary.  The total time spent in the appointment was 55 minutes.  Disclaimer: This note was dictated with voice recognition software. Similar sounding words can  inadvertently be transcribed and may not be corrected upon review.

## 2024-04-14 DIAGNOSIS — S82421A Displaced transverse fracture of shaft of right fibula, initial encounter for closed fracture: Secondary | ICD-10-CM | POA: Diagnosis not present

## 2024-04-14 DIAGNOSIS — S82391A Other fracture of lower end of right tibia, initial encounter for closed fracture: Secondary | ICD-10-CM | POA: Diagnosis not present

## 2024-04-19 ENCOUNTER — Other Ambulatory Visit (HOSPITAL_COMMUNITY): Payer: Self-pay

## 2024-04-26 ENCOUNTER — Telehealth: Payer: Self-pay

## 2024-04-26 ENCOUNTER — Other Ambulatory Visit (HOSPITAL_COMMUNITY): Payer: Self-pay

## 2024-04-26 NOTE — Telephone Encounter (Signed)
 Oral Oncology Patient Advocate Encounter Spoke to with patient  to inform her pharmacy she gets Augtyro  will change from [facility] to [facility]. I have sent all  patient's info to [facility] at [facility] with note to a new rx  should follow.

## 2024-04-27 ENCOUNTER — Other Ambulatory Visit: Payer: Self-pay | Admitting: Medical Oncology

## 2024-04-27 DIAGNOSIS — C3492 Malignant neoplasm of unspecified part of left bronchus or lung: Secondary | ICD-10-CM

## 2024-04-27 MED ORDER — REPOTRECTINIB 40 MG PO CAPS: 80.0000 mg | ORAL_CAPSULE | Freq: Every day | ORAL | 2 refills | Status: DC

## 2024-04-28 ENCOUNTER — Telehealth: Payer: Self-pay | Admitting: Medical Oncology

## 2024-04-28 NOTE — Telephone Encounter (Signed)
 I faxed clinical information to [facility] to support Repotrectinib  therapy.

## 2024-04-30 ENCOUNTER — Other Ambulatory Visit: Payer: Self-pay | Admitting: Physician Assistant

## 2024-04-30 ENCOUNTER — Other Ambulatory Visit (HOSPITAL_COMMUNITY): Payer: Self-pay

## 2024-04-30 DIAGNOSIS — C3492 Malignant neoplasm of unspecified part of left bronchus or lung: Secondary | ICD-10-CM

## 2024-04-30 MED ORDER — REPOTRECTINIB 40 MG PO CAPS: ORAL_CAPSULE | ORAL | 2 refills | Status: DC

## 2024-05-05 DIAGNOSIS — S82421A Displaced transverse fracture of shaft of right fibula, initial encounter for closed fracture: Secondary | ICD-10-CM | POA: Diagnosis not present

## 2024-05-05 DIAGNOSIS — S82391A Other fracture of lower end of right tibia, initial encounter for closed fracture: Secondary | ICD-10-CM | POA: Diagnosis not present

## 2024-05-06 ENCOUNTER — Other Ambulatory Visit: Payer: Self-pay | Admitting: Student

## 2024-05-06 DIAGNOSIS — S82391A Other fracture of lower end of right tibia, initial encounter for closed fracture: Secondary | ICD-10-CM

## 2024-05-10 ENCOUNTER — Ambulatory Visit
Admission: RE | Admit: 2024-05-10 | Discharge: 2024-05-10 | Disposition: A | Discharge status: Home / Self Care | Source: Ambulatory Visit | Attending: Student | Admitting: Student

## 2024-05-10 DIAGNOSIS — S82301K Unspecified fracture of lower end of right tibia, subsequent encounter for closed fracture with nonunion: Secondary | ICD-10-CM | POA: Diagnosis not present

## 2024-05-10 DIAGNOSIS — S82391A Other fracture of lower end of right tibia, initial encounter for closed fracture: Secondary | ICD-10-CM

## 2024-05-10 DIAGNOSIS — S82401D Unspecified fracture of shaft of right fibula, subsequent encounter for closed fracture with routine healing: Secondary | ICD-10-CM | POA: Diagnosis not present

## 2024-05-14 DIAGNOSIS — S82421D Displaced transverse fracture of shaft of right fibula, subsequent encounter for closed fracture with routine healing: Secondary | ICD-10-CM | POA: Diagnosis not present

## 2024-05-25 ENCOUNTER — Telehealth: Payer: Self-pay | Admitting: Medical Oncology

## 2024-05-25 ENCOUNTER — Encounter: Payer: Self-pay | Admitting: Medical Oncology

## 2024-05-25 NOTE — Telephone Encounter (Signed)
 Per Dr. Sherrod I told Shalandra that Dr Kit and Dr. Sherrod have discussed her situation re surgery.

## 2024-05-25 NOTE — Telephone Encounter (Signed)
 Closed fractures tibia , fibula -'tripped on a toy  .   Per Dr Kit her ankle is not structurally ok. She will need surgery to include screws.    Dr Kit wants to know the risk of Repotrectinib   and orthopedic surgery with the information that this medication can increase risk of fx with or without falls.   FMLA- requests letter to her. Letter mailed to pt .

## 2024-05-28 ENCOUNTER — Telehealth: Payer: Self-pay

## 2024-05-28 NOTE — Telephone Encounter (Signed)
   Pre-operative Risk Assessment    Patient Name: Melissa Pineda  DOB: 01-22-1986 MRN: [id number]   Date of last office visit: 07/07/23 OZELL FELL, MD Date of next office visit: NONE   Request for Surgical Clearance    Procedure:  OPEN TREATMENT RIGHT TIBIA FRACTURE NONUNION WITH INTERNAL FIXATION; POSSIBLE RIGHT FIBULA OSTEOTOMY; CALCANEUS AUTOGRAFT   Date of Surgery:  Clearance TBD                                Surgeon:  DR NORLEEN HEWITT Surgeon's Group or Practice Name:  [facility] Phone number:  [phone] Fax number:  [phone]  ATTN: MEGAN DAVIS   Type of Clearance Requested:   - Medical  - Pharmacy:  Hold Aspirin , Clopidogrel  (Plavix ), and Rivaroxaban  (Xarelto )     Type of Anesthesia:  General    Additional requests/questions:    Signed, Lucie DELENA Ku   05/28/2024, 5:48 PM

## 2024-06-01 ENCOUNTER — Telehealth: Payer: Self-pay

## 2024-06-01 NOTE — Telephone Encounter (Signed)
  Patient Consent for Virtual Visit        Melissa Pineda has provided verbal consent on 06/01/2024 for a virtual visit (video or telephone).   CONSENT FOR VIRTUAL VISIT FOR:  Melissa Pineda  By participating in this virtual visit I agree to the following:  I hereby voluntarily request, consent and authorize [facility] and its employed or contracted physicians, physician assistants, nurse practitioners or other licensed health care professionals (the Practitioner), to provide me with telemedicine health care services (the "Services) as deemed necessary by the treating Practitioner. I acknowledge and consent to receive the Services by the Practitioner via telemedicine. I understand that the telemedicine visit will involve communicating with the Practitioner through live audiovisual communication technology and the disclosure of certain medical information by electronic transmission. I acknowledge that I have been given the opportunity to request an in-person assessment or other available alternative prior to the telemedicine visit and am voluntarily participating in the telemedicine visit.  I understand that I have the right to withhold or withdraw my consent to the use of telemedicine in the course of my care at any time, without affecting my right to future care or treatment, and that the Practitioner or I may terminate the telemedicine visit at any time. I understand that I have the right to inspect all information obtained and/or recorded in the course of the telemedicine visit and may receive copies of available information for a reasonable fee.  I understand that some of the potential risks of receiving the Services via telemedicine include:  Delay or interruption in medical evaluation due to technological equipment failure or disruption; Information transmitted may not be sufficient (e.g. poor resolution of images) to allow for appropriate medical decision making by the Practitioner;  and/or  In rare instances, security protocols could fail, causing a breach of personal health information.  Furthermore, I acknowledge that it is my responsibility to provide information about my medical history, conditions and care that is complete and accurate to the best of my ability. I acknowledge that Practitioner's advice, recommendations, and/or decision may be based on factors not within their control, such as incomplete or inaccurate data provided by me or distortions of diagnostic images or specimens that may result from electronic transmissions. I understand that the practice of medicine is not an exact science and that Practitioner makes no warranties or guarantees regarding treatment outcomes. I acknowledge that a copy of this consent can be made available to me via my patient portal [facility] MyChart), or I can request a printed copy by calling the office of [facility].    I understand that my insurance will be billed for this visit.   I have read or had this consent read to me. I understand the contents of this consent, which adequately explains the benefits and risks of the Services being provided via telemedicine.  I have been provided ample opportunity to ask questions regarding this consent and the Services and have had my questions answered to my satisfaction. I give my informed consent for the services to be provided through the use of telemedicine in my medical care

## 2024-06-01 NOTE — Telephone Encounter (Signed)
   Name: Melissa Pineda  DOB: [birth year]  MRN: [id number]  Primary Cardiologist: None Last OV 07/07/23  Preoperative team, please contact this patient and set up a phone call appointment for further preoperative risk assessment. Please obtain consent and complete medication review. Thank you for your help.  I confirm that guidance regarding antiplatelet and oral anticoagulation therapy has been completed and, if necessary, noted below.  Xarelto , aspirin  and plavix  has been prescribed by vein and vascular services. Recommendations regarding holding of these medications will need to come from prescribing office.   I also confirmed the patient resides in the state of [place] . As per [facility] Medical Board telemedicine laws, the patient must reside in the state in which the provider is licensed.  Melissa Pineda D Melissa Bobrowski, NP 06/01/2024, 7:51 AM [facility]

## 2024-06-01 NOTE — Telephone Encounter (Signed)
 Pt scheduled for VV on 06/08/24.

## 2024-06-02 ENCOUNTER — Telehealth: Payer: Self-pay

## 2024-06-02 NOTE — Telephone Encounter (Signed)
 Faxed clearance form to [facility] Attn: Duwaine Moats with confirmation at [phone].

## 2024-06-08 ENCOUNTER — Ambulatory Visit: Attending: Cardiology | Admitting: Emergency Medicine

## 2024-06-08 DIAGNOSIS — Z01810 Encounter for preprocedural cardiovascular examination: Secondary | ICD-10-CM | POA: Diagnosis not present

## 2024-06-08 NOTE — Progress Notes (Signed)
 Virtual Visit via Telephone Note   Because of [name] co-morbid illnesses, she is at least at moderate risk for complications without adequate follow up.  This format is felt to be most appropriate for this patient at this time.  Due to technical limitations with video connection (technology), today's appointment will be conducted as an audio only telehealth visit, and Melissa Pineda verbally agreed to proceed in this manner.   All issues noted in this document were discussed and addressed.  No physical exam could be performed with this format.  Evaluation Performed:  Preoperative cardiovascular risk assessment _____________   Date:  06/08/2024   Patient ID:  Melissa Pineda, DOB Mar 11, 1986, MRN [id number] Patient Location:  Home Provider location:   Office  Primary Care Provider:  Patient, No Pcp Per Primary Cardiologist:  None  Chief Complaint / Patient Profile   38 y.o. y/o female with a h/o PFO, PAD, adenocarcinoma of left lung stage IV who is pending open treatment right tibia fracture nonunion with internal fixation, possible right fibula osteotomy, calcaneus autograft on date TBD with [facility] and presents today for telephonic preoperative cardiovascular risk assessment.  History of Present Illness    Melissa Pineda is a 38 y.o. female who presents via audio/video conferencing for a telehealth visit today.  Pt was last seen in cardiology clinic on 07/07/2023 by Dr. Wonda.  At that time Melissa Pineda was doing well.  The patient is now pending procedure as outlined above. Since her last visit, she denies chest pain, lower extremity edema, fatigue, palpitations, melena, hematuria, hemoptysis, diaphoresis, weakness, presyncope, syncope, orthopnea, and PND.  Today patient is doing well overall.  She is without acute cardiovascular concerns or complaints.  She denies any exertional symptoms and reports her chronic shortness of breath has been stable.  She does stay relatively active  taking care of her toddler and going to the gym without anginal symptoms.  Her activity level is currently limited due to her fracture.  Overall she is able to complete greater than 4 METS.  Past Medical History    Past Medical History:  Diagnosis Date   Anxiety    Phreesia 11/21/2020   Cancer [facility])    Past Surgical History:  Procedure Laterality Date   ABDOMINAL AORTOGRAM W/LOWER EXTREMITY N/A 11/27/2022   Procedure: ABDOMINAL AORTOGRAM W/LOWER EXTREMITY;  Surgeon: Magda Debby SAILOR, MD;  [facility]: [facility] INVAS[facility] CV LAB;  Service: Cardiovascular;  Laterality: N/A;   IR PERC PLEURAL DRAIN W/INDWELL CATH W/IMG GUIDE  11/09/2022   PERIPHERAL VASCULAR ATHERECTOMY  11/27/2022   Procedure: PERIPHERAL VASCULAR ATHERECTOMY;  Surgeon: Magda Debby SAILOR, MD;  Location: [facility] INVASIVE CV LAB;  Service: Cardiovascular;;   PERIPHERAL VASCULAR THROMBECTOMY  11/27/2022   Procedure: PERIPHERAL VASCULAR THROMBECTOMY;  Surgeon: Magda Debby SAILOR, MD;  Location: [facility] INVAS[facility] CV LAB;  Service: Cardiovascular;;   REMOVAL OF PLEURAL DRAINAGE CATHETER N/A 12/17/2022   Procedure: MINOR REMOVAL OF PLEURAL DRAINAGE CATHETER;  Surgeon: Brenna Adine CROME, DO;  Location: [facility] ENDOS[facility];  Service: Cardiopulmonary;  Laterality: N/A;   TEE WITHOUT CARDIOVERSION N/A 06/10/2023   Procedure: TRANSESOPHAGEAL ECHOCARDIOGRAM;  Surgeon: Raford Riggs, MD;  Location: [facility] INVASIVE CV LAB;  Service: Cardiovascular;  Laterality: N/A;   WISDOM TOOTH EXTRACTION      Allergies  Allergies  Allergen Reactions   Hydrocodone  Nausea Only    Dizzy, crawl out of my skin   Tramadol  Other (See Comments)    Weird feeling   Iodinated Contrast Media Hives    Home  Medications    Prior to Admission medications   Medication Sig Start Date End Date Taking? Authorizing Provider  acetaminophen  (TYLENOL ) 325 MG tablet Take 650 mg by mouth every 6 (six) hours as needed for mild pain.    [provider]  ALPRAZolam  (XANAX ) 0.5 MG tablet Take 1  tablet (0.5 mg total) by mouth 3 (three) times daily as needed for anxiety. 10/08/23   Pickenpack-Cousar, Athena N, NP  ascorbic acid (VITAMIN C) 500 MG tablet Take 500 mg by mouth daily.    [provider]  aspirin  EC 81 MG tablet Take 1 tablet (81 mg total) by mouth daily. Swallow whole. 11/29/22   Gerome Maurilio HERO, PA-C  b complex vitamins capsule Take 1 capsule by mouth daily.    [provider]  Cholecalciferol (VITAMIN D-3 PO) Take 2,000 Units by mouth daily.    [provider]  clopidogrel  (PLAVIX ) 75 MG tablet Take 1 tablet (75 mg total) by mouth daily. 11/29/22   Gerome Maurilio HERO, PA-C  Ferrous Sulfate (IRON HIGH-POTENCY) 142 (45 Fe) MG TBCR Take 142 mg by mouth daily.    [provider]  levonorgestrel  (MIRENA , 52 MG,) 20 MCG/DAY IUD 1 each by Intrauterine route once. 12/18/21   [provider]  metoCLOPramide  (REGLAN ) 5 MG tablet Take 1 tablet (5 mg total) by mouth every 6 (six) hours as needed for nausea. 11/18/22   Marvine Almarie CROME, DO  ondansetron  (ZOFRAN ) 8 MG tablet Take 1 tablet (8 mg total) by mouth every 8 (eight) hours as needed for nausea or vomiting. 11/12/22   Sherrod Sherrod, MD  ondansetron  (ZOFRAN -ODT) 8 MG disintegrating tablet Take 1 tablet (8 mg total) by mouth every 8 (eight) hours as needed for nausea or vomiting. 11/15/22   Parrett, Madelin RAMAN, NP  Ondansetron  4 MG FILM Take 4-8 mg by mouth every 6 (six) hours as needed. 11/12/22   Marvine Almarie CROME, DO  pantoprazole  (PROTONIX ) 40 MG tablet Take 1 tablet (40 mg total) by mouth daily. 09/23/23   Pickenpack-Cousar, Athena N, NP  polyethylene glycol (MIRALAX  / GLYCOLAX ) 17 g packet Take 17 g by mouth daily as needed for moderate constipation.    [provider]  predniSONE  (DELTASONE ) 50 MG tablet Take 1 tablet (50 mg total) by mouth as directed. Take one tablet 13 hours , 1 tablet 7 hours and 1 tablet 1 hour prior to CT scan with IV contrast. Take Benadryl  50 mg po 1 hour  before CT scan 03/30/24   Sherrod Sherrod, MD  repotrectinib  (AUGTYRO ) 40 MG capsule Take 4 tablets (160 mg) twice a day 04/30/24   Heilingoetter, Cassandra L, PA-C  XARELTO  20 MG TABS tablet TAKE 1 TABLET(20 MG) BY MOUTH DAILY WITH SUPPER 01/28/24   Magda Debby SAILOR, MD  Zinc 22.5 MG TABS Take 22.5 mg by mouth daily.    [provider]    Physical Exam    Vital Signs:  Melissa Pineda does not have vital signs available for review today.  Given telephonic nature of communication, physical exam is limited. AAOx3. NAD. Normal affect.  Speech and respirations are unlabored.  Accessory Clinical Findings    None  Assessment & Plan    1.  Preoperative Cardiovascular Risk Assessment: According to the Revised Cardiac Risk Index (RCRI), her Perioperative Risk of Major Cardiac Event is (%): 0.9. Her Functional Capacity in METs is: 6.27 according to the [employer] Activity Status Index (DASI). Therefore, based on ACC/AHA guidelines, patient would be at acceptable  risk for the planned procedure without further cardiovascular testing.  The patient was advised that if she develops new symptoms prior to surgery to contact our office to arrange for a follow-up visit, and she verbalized understanding.  Xarelto  is prescribed by vein and vascular services. Recommendations regarding holding of Xarelto  will need to come from prescribing office.   Patient no longer takes aspirin  or Plavix .  A copy of this note will be routed to requesting surgeon.  Time:   Today, I have spent 8 minutes with the patient with telehealth technology discussing medical history, symptoms, and management plan.     Melissa LITTIE Louis, NP  06/08/2024, 9:53 AM

## 2024-06-14 ENCOUNTER — Inpatient Hospital Stay

## 2024-06-14 ENCOUNTER — Inpatient Hospital Stay: Admitting: Internal Medicine

## 2024-06-17 ENCOUNTER — Inpatient Hospital Stay: Attending: Internal Medicine

## 2024-06-17 ENCOUNTER — Ambulatory Visit: Admitting: Internal Medicine

## 2024-06-22 ENCOUNTER — Other Ambulatory Visit: Payer: Self-pay | Admitting: Orthopedic Surgery

## 2024-06-22 ENCOUNTER — Telehealth: Payer: Self-pay | Admitting: Internal Medicine

## 2024-06-26 NOTE — Progress Notes (Unsigned)
 [facility] OFFICE PROGRESS NOTE  Patient, No Pcp Per No address on file  DIAGNOSIS:  Stage IVB (T4, N3, M1b) non-small cell lung cancer, adenocarcinoma presented with large left diaphragmatic surface mass in addition to left hilar, infrahilar, AP window, right and left paratracheal, subcarinal as well as left internal mammary lymphadenopathy in addition to right gastric lymph node and solitary Small right frontal calvarial osseous metastatic disease with large malignant left pleural effusion diagnosed in January 2024.     Molecular studies by foundation 1 as well as [employer] blood test showed:    EZR-ROS1 fusion approved by FDA Crizotinib, Entrectinib, Repotrectinib    approved in other indication Ceritinib, Lorlatinib Yes      3.6%   PD-L1 expression by foundation [employer] that was 98%.    PRIOR THERAPY: Status post left Pleurx catheter placement for drainage of recurrent left pleural effusion. This was removed on December 17, 2022.   CURRENT THERAPY: Repotrectinib  (Augtyro ) 160 mg p.o. daily for the first 2 weeks and then 160 mg p.o. twice daily, first dose November 27, 2022.  She is status post *** months of treatment   INTERVAL HISTORY: Melissa Pineda 38 y.o. female returns.  The patient the patient Augtyro  lung cancer she tripped over a toy and fractured her tibia and fibula and she is scheduled for surgery on 07/08/2024.   Otherwise she denies any major changes in her health.  She last saw Dr. Sherrod on 5.  Occasional joint pain in her hands.  Weight gain?  Fatigue and***anemia.  She is compliant with her iron supplement. ***Still on blood thinners?  She denies any unusual bleeding.?  She denies any fever, chills, or night sweats.  Appetite and weight loss?  Breathing?,  Hemoptysis, or cough.  Denies any nausea, vomiting, diarrhea, or the patient.  She denies any headache or visual changes.  She is here today for evaluation and repeat blood work.    MEDICAL HISTORY: Past  Medical History:  Diagnosis Date   Anxiety    Phreesia 11/21/2020   Cancer (HCC)     ALLERGIES:  is allergic to hydrocodone , tramadol , and iodinated contrast media.  MEDICATIONS:  Current Outpatient Medications  Medication Sig Dispense Refill   acetaminophen  (TYLENOL ) 325 MG tablet Take 650 mg by mouth every 6 (six) hours as needed for mild pain.     ALPRAZolam  (XANAX ) 0.5 MG tablet Take 1 tablet (0.5 mg total) by mouth 3 (three) times daily as needed for anxiety. 60 tablet 0   ascorbic acid (VITAMIN C) 500 MG tablet Take 500 mg by mouth daily.     aspirin  EC 81 MG tablet Take 1 tablet (81 mg total) by mouth daily. Swallow whole. 30 tablet 12   b complex vitamins capsule Take 1 capsule by mouth daily.     Cholecalciferol (VITAMIN D-3 PO) Take 2,000 Units by mouth daily.     clopidogrel  (PLAVIX ) 75 MG tablet Take 1 tablet (75 mg total) by mouth daily. 30 tablet 11   Ferrous Sulfate (IRON HIGH-POTENCY) 142 (45 Fe) MG TBCR Take 142 mg by mouth daily.     levonorgestrel  (MIRENA , 52 MG,) 20 MCG/DAY IUD 1 each by Intrauterine route once.     metoCLOPramide  (REGLAN ) 5 MG tablet Take 1 tablet (5 mg total) by mouth every 6 (six) hours as needed for nausea. 30 tablet 1   ondansetron  (ZOFRAN ) 8 MG tablet Take 1 tablet (8 mg total) by mouth every 8 (eight) hours as needed for nausea or  vomiting. 20 tablet 0   ondansetron  (ZOFRAN -ODT) 8 MG disintegrating tablet Take 1 tablet (8 mg total) by mouth every 8 (eight) hours as needed for nausea or vomiting. 20 tablet 1   Ondansetron  4 MG FILM Take 4-8 mg by mouth every 6 (six) hours as needed. 15 each 0   pantoprazole  (PROTONIX ) 40 MG tablet Take 1 tablet (40 mg total) by mouth daily. 30 tablet 3   polyethylene glycol (MIRALAX  / GLYCOLAX ) 17 g packet Take 17 g by mouth daily as needed for moderate constipation.     predniSONE  (DELTASONE ) 50 MG tablet Take 1 tablet (50 mg total) by mouth as directed. Take one tablet 13 hours , 1 tablet 7 hours and 1 tablet 1  hour prior to CT scan with IV contrast. Take Benadryl  50 mg po 1 hour before CT scan 3 tablet 5   repotrectinib  (AUGTYRO ) 40 MG capsule Take 4 tablets (160 mg) twice a day 240 capsule 2   XARELTO  20 MG TABS tablet TAKE 1 TABLET(20 MG) BY MOUTH DAILY WITH SUPPER 90 tablet 1   Zinc 22.5 MG TABS Take 22.5 mg by mouth daily.     No current facility-administered medications for this visit.    SURGICAL HISTORY:  Past Surgical History:  Procedure Laterality Date   ABDOMINAL AORTOGRAM W/LOWER EXTREMITY N/A 11/27/2022   Procedure: ABDOMINAL AORTOGRAM W/LOWER EXTREMITY;  Surgeon: Magda Debby SAILOR, MD;  [facility]: [facility] INV[facility] CV LAB;  Service: Cardiovascular;  Laterality: N/A;   IR PERC PLEURAL DRAIN W/INDWELL CATH W/IMG GUIDE  11/09/2022   PERIPHERAL VASCULAR ATHERECTOMY  11/27/2022   Procedure: PERIPHERAL VASCULAR ATHERECTOMY;  Surgeon: Magda Debby SAILOR, MD;  [facility]: [facility]VAS[facility] LAB;  Service: Cardiovascular;;   PERIPHERAL VASCULAR THROMBECTOMY  11/27/2022   Procedure: PERIPHERAL VASCULAR THROMBECTOMY;  Surgeon: Magda Debby SAILOR, MD;  Location: [facility] LAB;  Service: Cardiovascular;;   REMOVAL OF PLEURAL DRAINAGE CATHETER N/A 12/17/2022   Procedure: MINOR REMOVAL OF PLEURAL DRAINAGE CATHETER;  Surgeon: Brenna Adine CROME, DO;  Location: [facility];  Service: Cardiopulmonary;  Laterality: N/A;   TEE WITHOUT CARDIOVERSION N/A 06/10/2023   Procedure: TRANSESOPHAGEAL ECHOCARDIOGRAM;  Surgeon: Raford Riggs, MD;  Location: [facility] INVASIVE CV LAB;  Service: Cardiovascular;  Laterality: N/A;   WISDOM TOOTH EXTRACTION      REVIEW OF SYSTEMS:   Review of Systems  Constitutional: Negative for appetite change, chills, fatigue, fever and unexpected weight change.  HENT:   Negative for mouth sores, nosebleeds, sore throat and trouble swallowing.   Eyes: Negative for eye problems and icterus.  Respiratory: Negative for cough, hemoptysis, shortness of breath and wheezing.   Cardiovascular: Negative for  chest pain and leg swelling.  Gastrointestinal: Negative for abdominal pain, constipation, diarrhea, nausea and vomiting.  Genitourinary: Negative for bladder incontinence, difficulty urinating, dysuria, frequency and hematuria.   Musculoskeletal: Negative for back pain, gait problem, neck pain and neck stiffness.  Skin: Negative for itching and rash.  Neurological: Negative for dizziness, extremity weakness, gait problem, headaches, light-headedness and seizures.  Hematological: Negative for adenopathy. Does not bruise/bleed easily.  Psychiatric/Behavioral: Negative for confusion, depression and sleep disturbance. The patient is not nervous/anxious.     PHYSICAL EXAMINATION:  There were no vitals taken for this visit.  ECOG PERFORMANCE STATUS: {CHL ONC ECOG [id number]  Physical Exam  Constitutional: Oriented to person, place, and time and well-developed, well-nourished, and in no distress. No distress.  HENT:  Head: Normocephalic and atraumatic.  Mouth/Throat: Oropharynx is clear and moist. No oropharyngeal  exudate.  Eyes: Conjunctivae are normal. Right eye exhibits no discharge. Left eye exhibits no discharge. No scleral icterus.  Neck: Normal range of motion. Neck supple.  Cardiovascular: Normal rate, regular rhythm, normal heart sounds and intact distal pulses.   Pulmonary/Chest: Effort normal and breath sounds normal. No respiratory distress. No wheezes. No rales.  Abdominal: Soft. Bowel sounds are normal. Exhibits no distension and no mass. There is no tenderness.  Musculoskeletal: Normal range of motion. Exhibits no edema.  Lymphadenopathy:    No cervical adenopathy.  Neurological: Alert and oriented to person, place, and time. Exhibits normal muscle tone. Gait normal. Coordination normal.  Skin: Skin is warm and dry. No rash noted. Not diaphoretic. No erythema. No pallor.  Psychiatric: Mood, memory and judgment normal.  Vitals reviewed.  LABORATORY DATA: Lab Results   Component Value Date   WBC 6.9 04/02/2024   HGB 10.8 (L) 04/02/2024   HCT 32.0 (L) 04/02/2024   MCV 89.9 04/02/2024   PLT 600 (H) 04/02/2024      Chemistry      Component Value Date/Time   NA 142 04/02/2024 1603   NA 142 05/26/2023 0938   K 4.1 04/02/2024 1603   CL 106 04/02/2024 1603   CO2 26 04/02/2024 1603   BUN 15 04/02/2024 1603   BUN 17 05/26/2023 0938   CREATININE 0.98 04/02/2024 1603      Component Value Date/Time   CALCIUM 9.6 04/02/2024 1603   ALKPHOS 119 04/02/2024 1603   AST 23 04/02/2024 1603   ALT 23 04/02/2024 1603   BILITOT 0.3 04/02/2024 1603       RADIOGRAPHIC STUDIES:  No results found.   ASSESSMENT/PLAN:  This is a very pleasant 38 year old never smoker Caucasian female diagnosed with stage IV (T4, N3, M1 B) non-small cell lung cancer, adenocarcinoma.  She presented with a large left diaphragmatic surface mass in addition to left hilar, infrahilar, AP window, right and left paratracheal, subcarinal, left internal mammary, and right gastric lymphadenopathy in addition to a solitary small right frontal calvarial osseous metastatic lesion with a large malignant left pleural effusion.  She was diagnosed in January 2024.   Her molecular studies show that she has an actionable mutation with ROS1 fusion.  She also has a PD-L1 expression of 98%.   The patient had bilateral lower extremity occlusion and underwent thrombectomy under the care of Dr. Vonzell.     The patient is currently on targeted treatment with repotrectinib  initially as 160 mg p.o. daily for 2 weeks followed by increase of the dose to 160 mg p.o. twice daily starting on 12/12/22. Her first dose of treatment was on 11/27/22.  She has been tolerating this well. She has been on this for *** months.   Labs were reviewed and that she ***on the same treatment at the same dose. We will see her back for a follow-up visit 2 months.  We will arrange for restaging CT scan to be performed approximately 1  week prior to her follow-up visit.   ***Surgery?  Holding her medication?  The patient was advised to call immediately if she has any concerning symptoms in the interval. The patient voices understanding of current disease status and treatment options and is in agreement with the current care plan. All questions were answered. The patient knows to call the clinic with any problems, questions or concerns. We can certainly see the patient much sooner if necessary    No orders of the defined types were placed in this  encounter.    I spent {CHL ONC TIME VISIT - DTPQU:[id number]} counseling the patient face to face. The total time spent in the appointment was {CHL ONC TIME VISIT - DTPQU:[id number]}.  Tryone Kille L Adda Stokes, PA-C 06/26/24

## 2024-06-28 ENCOUNTER — Inpatient Hospital Stay (HOSPITAL_BASED_OUTPATIENT_CLINIC_OR_DEPARTMENT_OTHER): Admitting: Physician Assistant

## 2024-06-28 ENCOUNTER — Inpatient Hospital Stay: Attending: Internal Medicine

## 2024-06-28 VITALS — BP 124/71 | HR 76 | Temp 97.5°F | Resp 20 | Wt 183.1 lb

## 2024-06-28 DIAGNOSIS — Z7902 Long term (current) use of antithrombotics/antiplatelets: Secondary | ICD-10-CM | POA: Diagnosis not present

## 2024-06-28 DIAGNOSIS — Z7901 Long term (current) use of anticoagulants: Secondary | ICD-10-CM | POA: Insufficient documentation

## 2024-06-28 DIAGNOSIS — C3492 Malignant neoplasm of unspecified part of left bronchus or lung: Secondary | ICD-10-CM | POA: Diagnosis not present

## 2024-06-28 DIAGNOSIS — Z793 Long term (current) use of hormonal contraceptives: Secondary | ICD-10-CM | POA: Insufficient documentation

## 2024-06-28 DIAGNOSIS — Z7952 Long term (current) use of systemic steroids: Secondary | ICD-10-CM | POA: Insufficient documentation

## 2024-06-28 DIAGNOSIS — C7951 Secondary malignant neoplasm of bone: Secondary | ICD-10-CM | POA: Diagnosis not present

## 2024-06-28 DIAGNOSIS — Z7982 Long term (current) use of aspirin: Secondary | ICD-10-CM | POA: Diagnosis not present

## 2024-06-28 DIAGNOSIS — K5900 Constipation, unspecified: Secondary | ICD-10-CM | POA: Diagnosis not present

## 2024-06-28 DIAGNOSIS — Z79899 Other long term (current) drug therapy: Secondary | ICD-10-CM | POA: Diagnosis not present

## 2024-06-28 LAB — CMP (CANCER CENTER ONLY)
ALT: 16 U/L (ref 0–44)
AST: 19 U/L (ref 15–41)
Albumin: 4.8 g/dL (ref 3.5–5.0)
Alkaline Phosphatase: 113 U/L (ref 38–126)
Anion gap: 7 (ref 5–15)
BUN: 12 mg/dL (ref 6–20)
CO2: 30 mmol/L (ref 22–32)
Calcium: 9.7 mg/dL (ref 8.9–10.3)
Chloride: 106 mmol/L (ref 98–111)
Creatinine: 0.68 mg/dL (ref 0.44–1.00)
GFR, Estimated: >60 mL/min (ref >60)
Glucose, Bld: 102 mg/dL — ABNORMAL HIGH (ref 70–99)
Potassium: 4 mmol/L (ref 3.5–5.1)
Sodium: 143 mmol/L (ref 135–145)
Total Bilirubin: 0.3 mg/dL (ref 0.0–1.2)
Total Protein: 7.8 g/dL (ref 6.5–8.1)

## 2024-06-28 LAB — CBC WITH DIFFERENTIAL (CANCER CENTER ONLY)
Abs Immature Granulocytes: 0.03 K/uL (ref 0.00–0.07)
Basophils Absolute: 0 K/uL (ref 0.0–0.1)
Basophils Relative: 0 %
Eosinophils Absolute: 0 K/uL (ref 0.0–0.5)
Eosinophils Relative: 1 %
HCT: 31 % — ABNORMAL LOW (ref 36.0–46.0)
Hemoglobin: 10.2 g/dL — ABNORMAL LOW (ref 12.0–15.0)
Immature Granulocytes: 0 %
Lymphocytes Relative: 31 %
Lymphs Abs: 2.2 K/uL (ref 0.7–4.0)
MCH: 29.7 pg (ref 26.0–34.0)
MCHC: 32.9 g/dL (ref 30.0–36.0)
MCV: 90.4 fL (ref 80.0–100.0)
Monocytes Absolute: 0.5 K/uL (ref 0.1–1.0)
Monocytes Relative: 7 %
Neutro Abs: 4.2 K/uL (ref 1.7–7.7)
Neutrophils Relative %: 61 %
Platelet Count: 526 K/uL — ABNORMAL HIGH (ref 150–400)
RBC: 3.43 MIL/uL — ABNORMAL LOW (ref 3.87–5.11)
RDW: 13.3 % (ref 11.5–15.5)
WBC Count: 7 K/uL (ref 4.0–10.5)
nRBC: 0 % (ref 0.0–0.2)

## 2024-06-28 LAB — CK: Total CK: 146 U/L (ref 38–234)

## 2024-06-29 ENCOUNTER — Telehealth: Payer: Self-pay | Admitting: Internal Medicine

## 2024-06-29 NOTE — Telephone Encounter (Signed)
 Scheduled patient appointments, spoke with patient and she is aware.

## 2024-07-01 ENCOUNTER — Other Ambulatory Visit: Payer: Self-pay

## 2024-07-01 ENCOUNTER — Encounter (HOSPITAL_BASED_OUTPATIENT_CLINIC_OR_DEPARTMENT_OTHER): Payer: Self-pay | Admitting: Orthopedic Surgery

## 2024-07-01 NOTE — Progress Notes (Signed)
   07/01/24 1454  PAT Phone Screen  Is the patient taking a GLP-1 receptor agonist? No  Do You Have Diabetes? No  Do You Have Hypertension? No  Have You Ever Been to the ER for Asthma? No  Have You Taken Oral Steroids in the Past 3 Months? No  Do you Take Phenteramine or any Other Diet Drugs? No  Recent  Lab Work, EKG, CXR? Yes  Where was this test performed? 06/28/24 [facility] 07/07/23 EKG  Do you have a history of heart problems? Yes  Cardiologist Name Followed by Dr Wonda for PFO- not repaired. 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 Followed by Dr Magda for PAD. Clearance and Xaralto instructions on chart.  Have you ever had tests on your heart? Yes  What cardiac tests were performed? Echo  What date/year were cardiac tests completed? Echo 06/10/23 found under results review  Results viewable: [facility] Media Tab  Any Recent Hospitalizations? No  Height 5' 6 (1.676 m)  Weight 83 kg  Pat Appointment Scheduled No  Reason for No Appointment Not Needed   Pre op call completed with pt. Denies any SOB CP or recent illness. Hold time for (48 hrs) Xarelto  and Augtyro  (72 hrs) discussed with pt and she was verbalized understanding.  Previous anesthesia record, cardiac clearance and notes reviewed with Dr Paul. May proceed as planned with surgery at [facility].

## 2024-07-02 ENCOUNTER — Other Ambulatory Visit (HOSPITAL_COMMUNITY): Payer: Self-pay

## 2024-07-02 ENCOUNTER — Telehealth: Payer: Self-pay

## 2024-07-02 NOTE — Telephone Encounter (Signed)
 Oral Oncology Patient Advocate Encounter   Received notification from [facility] that prior authorization for repotrectinib  (AUGTYRO ) 40 MG capsule  is required.   PA submitted on 07/02/24 Key [id number] Status is pending     Lucie Lamer, CPhT [place]  [facility] Pharmacy Technician Patient Advocate Specialist II [facility] [place] Phone: [phone]  Fax: [phone] [email or web address]

## 2024-07-02 NOTE — Telephone Encounter (Signed)
 Oral Oncology Patient Advocate Encounter  [employer] came back and said PA not needed, has one on file that expires 05/05/25.  Lucie Lamer, CPhT [place]  [facility] Pharmacy Technician Patient Advocate Specialist II [place] Phone: [phone]  Fax: [phone] [email or web address]

## 2024-07-05 ENCOUNTER — Other Ambulatory Visit: Payer: Self-pay

## 2024-07-05 DIAGNOSIS — S82391K Other fracture of lower end of right tibia, subsequent encounter for closed fracture with nonunion: Secondary | ICD-10-CM | POA: Diagnosis not present

## 2024-07-05 DIAGNOSIS — F419 Anxiety disorder, unspecified: Secondary | ICD-10-CM

## 2024-07-05 DIAGNOSIS — Z515 Encounter for palliative care: Secondary | ICD-10-CM

## 2024-07-05 DIAGNOSIS — C3492 Malignant neoplasm of unspecified part of left bronchus or lung: Secondary | ICD-10-CM

## 2024-07-05 MED ORDER — ALPRAZOLAM 0.5 MG PO TABS
0.5000 mg | ORAL_TABLET | Freq: Three times a day (TID) | ORAL | 0 refills | Status: AC | PRN
Start: 2024-07-05 — End: ?

## 2024-07-07 NOTE — H&P (Cosign Needed)
   Chief Complaint: Right lower leg pain  HPI: Patient is a 38 year old female that presents to the OR today for elective treatment for her right tibia malunion.  She has failed conservative treatment consisting of immobilization in short leg cast, cam boot, and activity modification and elects for surgical treatment.  Allergies:  Allergies  Allergen Reactions   Hydrocodone  Nausea Only    Dizzy, crawl out of my skin   Tramadol  Other (See Comments)    Weird feeling   Iodinated Contrast Media Hives    Past Medical History:  Diagnosis Date   Anxiety    [employer] 11/21/2020   PAD (peripheral artery disease) (HCC)    Managed by Dr Magda   PFO (patent foramen ovale)    Dr Wonda   Stage IV adenocarcinoma of lung, unspecified laterality [facility])     Past Surgical History:  Procedure Laterality Date   ABDOMINAL AORTOGRAM W/LOWER EXTREMITY N/A 11/27/2022   Procedure: ABDOMINAL AORTOGRAM W/LOWER EXTREMITY;  Surgeon: Magda Debby SAILOR, MD;  Location: [facility] INVASIVE CV LAB;  Service: Cardiovascular;  Laterality: N/A;   IR PERC PLEURAL DRAIN W/INDWELL CATH W/IMG GUIDE  11/09/2022   PERIPHERAL VASCULAR ATHERECTOMY  11/27/2022   Procedure: PERIPHERAL VASCULAR ATHERECTOMY;  Surgeon: Magda Debby SAILOR, MD;  Location: [facility] INVASIVE CV LAB;  Service: Cardiovascular;;   PERIPHERAL VASCULAR THROMBECTOMY  11/27/2022   Procedure: PERIPHERAL VASCULAR THROMBECTOMY;  Surgeon: Magda Debby SAILOR, MD;  Location: [facility] INVASIVE CV LAB;  Service: Cardiovascular;;   REMOVAL OF PLEURAL DRAINAGE CATHETER N/A 12/17/2022   Procedure: MINOR REMOVAL OF PLEURAL DRAINAGE CATHETER;  Surgeon: Brenna Adine CROME, DO;  Location: [facility] ENDOSCOPY;  Service: Cardiopulmonary;  Laterality: N/A;   TEE WITHOUT CARDIOVERSION N/A 06/10/2023   Procedure: TRANSESOPHAGEAL ECHOCARDIOGRAM;  Surgeon: Raford Riggs, MD;  Location: [facility] INVASIVE CV LAB;  Service: Cardiovascular;  Laterality: N/A;   WISDOM TOOTH EXTRACTION      Family History: Family  History  Problem Relation Age of Onset   Cancer Mother    Cancer Maternal Grandfather    Cancer Maternal Grandmother    Cancer Paternal Grandfather    Hypertension Paternal Grandmother     Social History:   reports that she has never smoked. She has never used smokeless tobacco. She reports current alcohol use of about 7.0 standard drinks of alcohol per week. She reports current drug use. Drug: Marijuana.  Medications: No medications prior to admission.    No results found for this or any previous visit (from the past 48 hours).  No results found.    Height 5' 6 (1.676 m), weight 83 kg, last menstrual period 06/23/2024.  PE:  well nourished and well developed.  NAD.  EOMI.  Resp unlabored.  Tender to palpation R tibia.  Assessment/Plan Right tibia malunion  The patient presents to the OR for definitive treatment of her right tibia malunion.  She will require ORIF of right tibia malunion. After reviewing the procedure, postoperative protocol, and risks of surgery the patient elects for surgical intervention.  The patient specifically understands risks of bleeding, infection, nerve damage, blood clots, need for additional surgery, continued pain, nonunion, post traumatic arthritis, recurrence of deformity, amputation and death.   Eva Barrack PA-C [facility] Office:  [phone]

## 2024-07-07 NOTE — Anesthesia Preprocedure Evaluation (Signed)
 Anesthesia Evaluation  Patient identified by MRN, date of birth, ID band Patient awake    Reviewed: Allergy & Precautions, NPO status , Patient's Chart, lab work & pertinent test results  History of Anesthesia Complications Negative for: history of anesthetic complications  Airway Mallampati: II  TM Distance: >3 FB Neck ROM: Full    Dental  (+) Dental Advisory Given   Pulmonary neg shortness of breath, neg sleep apnea, neg COPD, neg recent URI Stage IV lung adenocarcinoma   Pulmonary exam normal breath sounds clear to auscultation       Cardiovascular (-) hypertension(-) angina + Peripheral Vascular Disease  (-) Past MI, (-) Cardiac Stents and (-) CABG (-) dysrhythmias  Rhythm:Regular Rate:Normal  PFO  TTE 06/10/2023: IMPRESSIONS    1. Left ventricular ejection fraction, by estimation, is 60 to 65%. The  left ventricle has normal function. The left ventricle has no regional  wall motion abnormalities.   2. Right ventricular systolic function is normal. The right ventricular  size is normal.   3. No left atrial/left atrial appendage thrombus was detected.   4. The mitral valve is normal in structure. No evidence of mitral valve  regurgitation. No evidence of mitral stenosis.   5. The aortic valve is tricuspid. Aortic valve regurgitation is not  visualized. No aortic stenosis is present.   6. The inferior vena cava is normal in size with greater than 50%  respiratory variability, suggesting right atrial pressure of 3 mmHg.   7. Agitated saline contrast bubble study was positive with shunting  observed within 3-6 cardiac cycles suggestive of interatrial shunt. 3D  images of the PFO were acquired. There is a small patent foramen ovale  with predominantly right to left shunting  across the atrial septum.     Neuro/Psych neg Seizures PSYCHIATRIC DISORDERS Anxiety     CVA, No Residual Symptoms    GI/Hepatic negative GI ROS,  Neg liver ROS,,,  Endo/Other  negative endocrine ROS    Renal/GU negative Renal ROS     Musculoskeletal   Abdominal   Peds  Hematology  (+) Blood dyscrasia, anemia Lab Results      Component                Value               Date                      WBC                      7.0                 06/28/2024                HGB                      10.2 (L)            06/28/2024                HCT                      31.0 (L)            06/28/2024                MCV  90.4                06/28/2024                PLT                      526 (H)             06/28/2024              Anesthesia Other Findings 38 y.o. y/o female with a h/o PFO, PAD, adenocarcinoma of left lung stage IV  Last Xarelto : 07/05/2024  Reproductive/Obstetrics                              Anesthesia Physical Anesthesia Plan  ASA: 3  Anesthesia Plan: General   Post-op Pain Management: Regional block* and Tylenol  PO (pre-op)*   Induction: Intravenous  PONV Risk Score and Plan: 3 and Ondansetron , Dexamethasone , Midazolam  and Treatment may vary due to age or medical condition  Airway Management Planned: LMA  Additional Equipment:   Intra-op Plan:   Post-operative Plan: Extubation in OR  Informed Consent: I have reviewed the patients History and Physical, chart, labs and discussed the procedure including the risks, benefits and alternatives for the proposed anesthesia with the patient or authorized representative who has indicated his/her understanding and acceptance.     Dental advisory given  Plan Discussed with: CRNA and Anesthesiologist  Anesthesia Plan Comments: (Discussed potential risks of nerve blocks including, but not limited to, infection, bleeding, nerve damage, seizures, pneumothorax, respiratory depression, and potential failure of the block. Alternatives to nerve blocks discussed. All questions answered.  Risks of general anesthesia  discussed including, but not limited to, sore throat, hoarse voice, chipped/damaged teeth, injury to vocal cords, nausea and vomiting, allergic reactions, lung infection, heart attack, stroke, and death. All questions answered. )         Anesthesia Quick Evaluation

## 2024-07-07 NOTE — Progress Notes (Signed)

## 2024-07-08 ENCOUNTER — Ambulatory Visit (HOSPITAL_BASED_OUTPATIENT_CLINIC_OR_DEPARTMENT_OTHER)
Admission: RE | Admit: 2024-07-08 | Discharge: 2024-07-08 | Disposition: A | Discharge status: Home / Self Care | Attending: Orthopedic Surgery | Admitting: Orthopedic Surgery

## 2024-07-08 ENCOUNTER — Encounter (HOSPITAL_BASED_OUTPATIENT_CLINIC_OR_DEPARTMENT_OTHER): Payer: Self-pay | Admitting: Orthopedic Surgery

## 2024-07-08 ENCOUNTER — Ambulatory Visit (HOSPITAL_BASED_OUTPATIENT_CLINIC_OR_DEPARTMENT_OTHER): Payer: Self-pay | Admitting: Anesthesiology

## 2024-07-08 ENCOUNTER — Other Ambulatory Visit: Payer: Self-pay

## 2024-07-08 ENCOUNTER — Ambulatory Visit (HOSPITAL_BASED_OUTPATIENT_CLINIC_OR_DEPARTMENT_OTHER)

## 2024-07-08 ENCOUNTER — Encounter (HOSPITAL_BASED_OUTPATIENT_CLINIC_OR_DEPARTMENT_OTHER)
Admission: RE | Disposition: A | Discharge status: Home / Self Care | Payer: Self-pay | Source: Home / Self Care | Attending: Orthopedic Surgery

## 2024-07-08 DIAGNOSIS — Z01818 Encounter for other preprocedural examination: Secondary | ICD-10-CM

## 2024-07-08 DIAGNOSIS — X58XXXD Exposure to other specified factors, subsequent encounter: Secondary | ICD-10-CM | POA: Insufficient documentation

## 2024-07-08 DIAGNOSIS — F1290 Cannabis use, unspecified, uncomplicated: Secondary | ICD-10-CM | POA: Diagnosis not present

## 2024-07-08 DIAGNOSIS — S82401P Unspecified fracture of shaft of right fibula, subsequent encounter for closed fracture with malunion: Secondary | ICD-10-CM | POA: Insufficient documentation

## 2024-07-08 DIAGNOSIS — S8264XP Nondisplaced fracture of lateral malleolus of right fibula, subsequent encounter for closed fracture with malunion: Secondary | ICD-10-CM | POA: Diagnosis not present

## 2024-07-08 DIAGNOSIS — Z9221 Personal history of antineoplastic chemotherapy: Secondary | ICD-10-CM | POA: Insufficient documentation

## 2024-07-08 DIAGNOSIS — M21171 Varus deformity, not elsewhere classified, right ankle: Secondary | ICD-10-CM | POA: Diagnosis not present

## 2024-07-08 DIAGNOSIS — Z85118 Personal history of other malignant neoplasm of bronchus and lung: Secondary | ICD-10-CM | POA: Diagnosis not present

## 2024-07-08 DIAGNOSIS — G8918 Other acute postprocedural pain: Secondary | ICD-10-CM | POA: Diagnosis not present

## 2024-07-08 DIAGNOSIS — F419 Anxiety disorder, unspecified: Secondary | ICD-10-CM | POA: Diagnosis not present

## 2024-07-08 DIAGNOSIS — S82201K Unspecified fracture of shaft of right tibia, subsequent encounter for closed fracture with nonunion: Secondary | ICD-10-CM | POA: Insufficient documentation

## 2024-07-08 DIAGNOSIS — M84361K Stress fracture, right tibia, subsequent encounter for fracture with nonunion: Secondary | ICD-10-CM | POA: Diagnosis not present

## 2024-07-08 DIAGNOSIS — S82391K Other fracture of lower end of right tibia, subsequent encounter for closed fracture with nonunion: Secondary | ICD-10-CM | POA: Diagnosis not present

## 2024-07-08 HISTORY — DX: Malignant neoplasm of unspecified part of unspecified bronchus or lung: C34.90

## 2024-07-08 HISTORY — DX: Peripheral vascular disease, unspecified: I73.9

## 2024-07-08 HISTORY — PX: TIBIA OSTEOTOMY: SHX1065

## 2024-07-08 HISTORY — DX: Patent foramen ovale: Q21.12

## 2024-07-08 HISTORY — PX: ORIF TIBIA FRACTURE: SHX5416

## 2024-07-08 LAB — POCT PREGNANCY, URINE: Preg Test, Ur: NEGATIVE

## 2024-07-08 SURGERY — OPEN REDUCTION INTERNAL FIXATION (ORIF) TIBIA FRACTURE
Anesthesia: General | Site: Leg Lower | Laterality: Right

## 2024-07-08 MED ORDER — OXYCODONE HCL 5 MG PO TABS: 5.0000 mg | ORAL_TABLET | Freq: Once | ORAL | Status: DC | PRN

## 2024-07-08 MED ORDER — BUPIVACAINE HCL (PF) 0.5 % IJ SOLN
INTRAMUSCULAR | Status: DC | PRN
  Administered 2024-07-08: 10 mL via PERINEURAL

## 2024-07-08 MED ORDER — LACTATED RINGERS IV SOLN: INTRAVENOUS | Status: DC

## 2024-07-08 MED ORDER — OXYCODONE HCL 5 MG PO TABS: 5.0000 mg | ORAL_TABLET | Freq: Four times a day (QID) | ORAL | 0 refills | Status: AC | PRN

## 2024-07-08 MED ORDER — ONDANSETRON HCL 4 MG/2ML IJ SOLN
INTRAMUSCULAR | Status: DC | PRN
  Administered 2024-07-08: 4 mg via INTRAVENOUS

## 2024-07-08 MED ORDER — DOCUSATE SODIUM 100 MG PO CAPS: 100.0000 mg | ORAL_CAPSULE | Freq: Two times a day (BID) | ORAL | 0 refills | Status: DC

## 2024-07-08 MED ORDER — FENTANYL CITRATE (PF) 100 MCG/2ML IJ SOLN
100.0000 ug | Freq: Once | INTRAMUSCULAR | Status: AC
  Administered 2024-07-08: 50 ug via INTRAVENOUS

## 2024-07-08 MED ORDER — BUPIVACAINE HCL (PF) 0.25 % IJ SOLN
INTRAMUSCULAR | Status: DC | PRN
  Administered 2024-07-08 (×2): 20 mL via PERINEURAL

## 2024-07-08 MED ORDER — VANCOMYCIN HCL 500 MG IV SOLR
INTRAVENOUS | Status: DC | PRN
  Administered 2024-07-08: 500 mg via TOPICAL

## 2024-07-08 MED ORDER — FENTANYL CITRATE (PF) 100 MCG/2ML IJ SOLN
INTRAMUSCULAR | Status: AC
  Filled 2024-07-08: qty 2

## 2024-07-08 MED ORDER — MIDAZOLAM HCL 2 MG/2ML IJ SOLN
2.0000 mg | Freq: Once | INTRAMUSCULAR | Status: AC
  Administered 2024-07-08: 2 mg via INTRAVENOUS

## 2024-07-08 MED ORDER — OXYCODONE HCL 5 MG/5ML PO SOLN: 5.0000 mg | Freq: Once | ORAL | Status: DC | PRN

## 2024-07-08 MED ORDER — LIDOCAINE HCL (CARDIAC) PF 100 MG/5ML IV SOSY
PREFILLED_SYRINGE | INTRAVENOUS | Status: DC | PRN
  Administered 2024-07-08: 100 mg via INTRAVENOUS

## 2024-07-08 MED ORDER — DEXAMETHASONE SODIUM PHOSPHATE 4 MG/ML IJ SOLN
INTRAMUSCULAR | Status: DC | PRN
  Administered 2024-07-08: 8 mg via INTRAVENOUS

## 2024-07-08 MED ORDER — 0.9 % SODIUM CHLORIDE (POUR BTL) OPTIME
TOPICAL | Status: DC | PRN
  Administered 2024-07-08: 1000 mL

## 2024-07-08 MED ORDER — SENNA 8.6 MG PO TABS: 2.0000 | ORAL_TABLET | Freq: Two times a day (BID) | ORAL | 0 refills | Status: DC

## 2024-07-08 MED ORDER — MIDAZOLAM HCL 2 MG/2ML IJ SOLN
INTRAMUSCULAR | Status: AC
  Filled 2024-07-08: qty 2

## 2024-07-08 MED ORDER — FENTANYL CITRATE (PF) 100 MCG/2ML IJ SOLN
INTRAMUSCULAR | Status: DC | PRN
  Administered 2024-07-08: 25 ug via INTRAVENOUS
  Administered 2024-07-08 (×2): 50 ug via INTRAVENOUS
  Administered 2024-07-08: 25 ug via INTRAVENOUS
  Administered 2024-07-08: 50 ug via INTRAVENOUS

## 2024-07-08 MED ORDER — PROPOFOL 10 MG/ML IV BOLUS
INTRAVENOUS | Status: DC | PRN
  Administered 2024-07-08: 200 mg via INTRAVENOUS

## 2024-07-08 MED ORDER — FENTANYL CITRATE (PF) 100 MCG/2ML IJ SOLN
25.0000 ug | INTRAMUSCULAR | Status: DC | PRN
  Administered 2024-07-08: 50 ug via INTRAVENOUS

## 2024-07-08 MED ORDER — DEXMEDETOMIDINE HCL IN NACL 80 MCG/20ML IV SOLN
INTRAVENOUS | Status: DC | PRN
  Administered 2024-07-08: 4 ug via INTRAVENOUS

## 2024-07-08 MED ORDER — AMISULPRIDE (ANTIEMETIC) 5 MG/2ML IV SOLN
10.0000 mg | Freq: Once | INTRAVENOUS | Status: AC | PRN
  Administered 2024-07-08: 10 mg via INTRAVENOUS

## 2024-07-08 MED ORDER — CEFAZOLIN SODIUM-DEXTROSE 2-4 GM/100ML-% IV SOLN
2.0000 g | INTRAVENOUS | Status: AC
  Administered 2024-07-08: 2 g via INTRAVENOUS

## 2024-07-08 MED ORDER — AMISULPRIDE (ANTIEMETIC) 5 MG/2ML IV SOLN
INTRAVENOUS | Status: AC
  Filled 2024-07-08: qty 4

## 2024-07-08 MED ORDER — BUPIVACAINE LIPOSOME 1.3 % IJ SUSP
INTRAMUSCULAR | Status: DC | PRN
  Administered 2024-07-08: 10 mL via PERINEURAL

## 2024-07-08 MED ORDER — CEFAZOLIN SODIUM-DEXTROSE 2-4 GM/100ML-% IV SOLN
INTRAVENOUS | Status: AC
  Filled 2024-07-08: qty 100

## 2024-07-08 SURGICAL SUPPLY — 78 items
AUGMENT INJECTABLE KIT 3CC (Tissue) ×2 IMPLANT
BIT DRILL SHORT 2.5 (BIT) ×2 IMPLANT
BLADE AVERAGE 25X9 (BLADE) ×2 IMPLANT
BLADE LONG MED 25X9 (BLADE) IMPLANT
BLADE OSC/SAG .038X5.5 CUT EDG (BLADE) IMPLANT
BLADE SURG 15 STRL LF DISP TIS (BLADE) ×10 IMPLANT
BNDG COMPR ESMARK 6X3 LF (GAUZE/BANDAGES/DRESSINGS) IMPLANT
BNDG ELASTIC 4INX 5YD STR LF (GAUZE/BANDAGES/DRESSINGS) ×4 IMPLANT
BNDG ELASTIC 6INX 5YD STR LF (GAUZE/BANDAGES/DRESSINGS) ×4 IMPLANT
BNDG STRETCH GAUZE 3IN X12FT (GAUZE/BANDAGES/DRESSINGS) ×4 IMPLANT
CHLORAPREP W/TINT 26 (MISCELLANEOUS) ×4 IMPLANT
COVER BACK TABLE 60X90IN (DRAPES) ×4 IMPLANT
CUFF TRNQT CYL 24X4X16.5-23 (TOURNIQUET CUFF) IMPLANT
CUFF TRNQT CYL 34X4.125X (TOURNIQUET CUFF) ×4 IMPLANT
DRAPE EXTREMITY T 121X128X90 (DISPOSABLE) ×4 IMPLANT
DRAPE OEC MINIVIEW 54X84 (DRAPES) ×4 IMPLANT
DRAPE U-SHAPE 47X51 STRL (DRAPES) ×4 IMPLANT
DRIVER RETENTION T15 LONG (ORTHOPEDIC DISPOSABLE SUPPLIES) ×4 IMPLANT
DRSG MEPITEL 4X7.2 (GAUZE/BANDAGES/DRESSINGS) ×4 IMPLANT
ELECTRODE REM PT RTRN 9FT ADLT (ELECTROSURGICAL) ×4 IMPLANT
GAUZE PAD ABD 8X10 STRL (GAUZE/BANDAGES/DRESSINGS) ×8 IMPLANT
GAUZE SPONGE 4X4 12PLY STRL (GAUZE/BANDAGES/DRESSINGS) ×4 IMPLANT
GLOVE BIO SURGEON STRL SZ8 (GLOVE) ×6 IMPLANT
GLOVE BIOGEL PI IND STRL 7.0 (GLOVE) ×2 IMPLANT
GLOVE BIOGEL PI IND STRL 7.5 (GLOVE) ×2 IMPLANT
GLOVE BIOGEL PI IND STRL 8 (GLOVE) ×8 IMPLANT
GLOVE ECLIPSE 8.0 STRL XLNG CF (GLOVE) ×4 IMPLANT
GLOVE SURG SS PI 6.5 STRL IVOR (GLOVE) ×2 IMPLANT
GLOVE SURG SS PI 7.0 STRL IVOR (GLOVE) ×4 IMPLANT
GOWN STRL REUS W/ TWL LRG LVL3 (GOWN DISPOSABLE) ×6 IMPLANT
GOWN STRL REUS W/ TWL XL LVL3 (GOWN DISPOSABLE) ×10 IMPLANT
GRAFT BNE CANC CHIPS 1-8 20CC (Bone Implant) ×2 IMPLANT
KWIRE ALPS MXV 1.6X6 ZI (WIRE) ×4 IMPLANT
KWIRE DBL .054X9 NSTRL (WIRE) IMPLANT
KWIRE OUTRIGGER LNG SA NS (WIRE) ×2 IMPLANT
NDL HYPO 22X1.5 SAFETY MO (MISCELLANEOUS) IMPLANT
NEEDLE HYPO 22X1.5 SAFETY MO (MISCELLANEOUS) IMPLANT
NS IRRIG 1000ML POUR BTL (IV SOLUTION) ×4 IMPLANT
PACK BASIN DAY SURGERY FS (CUSTOM PROCEDURE TRAY) ×4 IMPLANT
PAD CAST 4YDX4 CTTN HI CHSV (CAST SUPPLIES) ×4 IMPLANT
PADDING CAST ABS COTTON 4X4 ST (CAST SUPPLIES) IMPLANT
PENCIL SMOKE EVACUATOR (MISCELLANEOUS) ×4 IMPLANT
PLATE LAT FIB 4H RT (Plate) ×2 IMPLANT
PLATE TIB MED 6H RT (Plate) ×2 IMPLANT
SANITIZER HAND ALTRA PUMP 550 (MISCELLANEOUS) ×4 IMPLANT
SCREW LOCK 3.5X10 (Screw) ×2 IMPLANT
SCREW LOCK MDS 3.5X12 (Screw) ×2 IMPLANT
SCREW LOCK MDS 3.5X14 (Screw) ×4 IMPLANT
SCREW LOCK MDS 3.5X16 (Screw) ×4 IMPLANT
SCREW LOCK MDS 3.5X28 (Screw) ×2 IMPLANT
SCREW LOCK MDS 3.5X30 (Screw) ×2 IMPLANT
SCREW LOCK MDS 3.5X40 (Screw) ×4 IMPLANT
SCREW NL3.5X32 (Screw) ×2 IMPLANT
SCREW NLOCK 3.5X24 (Screw) ×2 IMPLANT
SCREW NLOCK 3.5X30 (Screw) ×4 IMPLANT
SCREW NLOCK 3.5X40 (Screw) ×2 IMPLANT
SCREW NLOCK 3.5X42 (Screw) ×2 IMPLANT
SCREW NLOCK ALPS 3.5X18 (Screw) ×2 IMPLANT
SCREW NON-LOCK 3.5X10 (Screw) ×2 IMPLANT
SCREW NON-LOCK 3.5X12 (Screw) ×2 IMPLANT
SHEET MEDIUM DRAPE 40X70 STRL (DRAPES) ×4 IMPLANT
SLEEVE SCD COMPRESS KNEE MED (STOCKING) ×4 IMPLANT
SPLINT PLASTER CAST FAST 5X30 (CAST SUPPLIES) ×80 IMPLANT
SPONGE T-LAP 18X18 ~~LOC~~+RFID (SPONGE) ×4 IMPLANT
STAPLER SKIN PROX 35W (STAPLE) ×2 IMPLANT
STAPLER SKIN PROX WIDE 3.9 (STAPLE) ×2 IMPLANT
STOCKINETTE 6 STRL (DRAPES) ×4 IMPLANT
SUCTION TUBE FRAZIER 10FR DISP (SUCTIONS) ×4 IMPLANT
SUT ETHILON 3 0 PS 1 (SUTURE) ×4 IMPLANT
SUT MNCRL AB 3-0 PS2 18 (SUTURE) ×4 IMPLANT
SUT VIC AB 2-0 SH 27XBRD (SUTURE) ×4 IMPLANT
SUT VICRYL 0 SH 27 (SUTURE) IMPLANT
SUT VICRYL 0 UR6 27IN ABS (SUTURE) IMPLANT
SYR BULB EAR ULCER 3OZ GRN STR (SYRINGE) ×4 IMPLANT
SYR CONTROL 10ML LL (SYRINGE) IMPLANT
TOWEL GREEN STERILE FF (TOWEL DISPOSABLE) ×8 IMPLANT
TUBE CONNECTING 20X1/4 (TUBING) ×4 IMPLANT
UNDERPAD 30X36 HEAVY ABSORB (UNDERPADS AND DIAPERS) ×4 IMPLANT

## 2024-07-08 NOTE — Discharge Instructions (Addendum)
 Norleen Armor, MD [facility]  Please read the following information regarding your care after surgery.  Medications  You only need a prescription for the narcotic pain medicine (ex. oxycodone , Percocet, Norco).  All of the other medicines listed below are available over the counter. ? Aleve 2 pills twice a day for the first 3 days after surgery. ? acetominophen (Tylenol ) 650 mg every 4-6 hours as you need for minor to moderate pain ? oxycodone  as prescribed for severe pain  Narcotic pain medicine (ex. oxycodone , Percocet, Vicodin) will cause constipation.  To prevent this problem, take the following medicines while you are taking any pain medicine. ? docusate sodium  (Colace) 100 mg twice a day ? senna (Senokot) 2 tablets twice a day  ? To help prevent blood clots, resume your Xarelto  the after surgery.  You should also get up every hour while you are awake to move around.    Weight Bearing ? Do not bear any weight on the operated leg or foot.  Cast / Splint / Dressing ? Keep your splint, cast or dressing clean and dry.  Don't put anything (coat hanger, pencil, etc) down inside of it.  If it gets damp, use a hair dryer on the cool setting to dry it.  If it gets soaked, call the office to schedule an appointment for a cast change.   After your dressing, cast or splint is removed; you may shower, but do not soak or scrub the wound.  Allow the water to run over it, and then gently pat it dry.  Swelling It is normal for you to have swelling where you had surgery.  To reduce swelling and pain, keep your toes above your nose for at least 3 days after surgery.  It may be necessary to keep your foot or leg elevated for several weeks.  If it hurts, it should be elevated.  Follow Up Call my office at [phone] when you are discharged from the hospital or surgery center to schedule an appointment to be seen two weeks after surgery.  Call my office at [phone] if you develop a fever >101.5  F, nausea, vomiting, bleeding from the surgical site or severe pain.      Regional Anesthesia Blocks  1. You may not be able to move or feel the blocked extremity after a regional anesthetic block. This may last may last from 3-48 hours after placement, but it will go away. The length of time depends on the medication injected and your individual response to the medication. As the nerves start to wake up, you may experience tingling as the movement and feeling returns to your extremity. If the numbness and inability to move your extremity has not gone away after 48 hours, please call your surgeon.   2. The extremity that is blocked will need to be protected until the numbness is gone and the strength has returned. Because you cannot feel it, you will need to take extra care to avoid injury. Because it may be weak, you may have difficulty moving it or using it. You may not know what position it is in without looking at it while the block is in effect.  3. For blocks in the legs and feet, returning to weight bearing and walking needs to be done carefully. You will need to wait until the numbness is entirely gone and the strength has returned. You should be able to move your leg and foot normally before you try and bear weight or walk. You  will need someone to be with you when you first try to ensure you do not fall and possibly risk injury.  4. Bruising and tenderness at the needle site are common side effects and will resolve in a few days.  5. Persistent numbness or new problems with movement should be communicated to the surgeon or the [facility] [phone] [facility] [phone]).  Post Anesthesia Home Care Instructions  Activity: Get plenty of rest for the remainder of the day. A responsible individual must stay with you for 24 hours following the procedure.  For the next 24 hours, DO NOT: -Drive a car -Advertising copywriter -Drink alcoholic beverages -Take  any medication unless instructed by your physician -Make any legal decisions or sign important papers.  Meals: Start with liquid foods such as gelatin or soup. Progress to regular foods as tolerated. Avoid greasy, spicy, heavy foods. If nausea and/or vomiting occur, drink only clear liquids until the nausea and/or vomiting subsides. Call your physician if vomiting continues.  Special Instructions/Symptoms: Your throat may feel dry or sore from the anesthesia or the breathing tube placed in your throat during surgery. If this causes discomfort, gargle with warm salt water. The discomfort should disappear within 24 hours.  If you had a scopolamine  patch placed behind your ear for the management of post- operative nausea and/or vomiting:  1. The medication in the patch is effective for 72 hours, after which it should be removed.  Wrap patch in a tissue and discard in the trash. Wash hands thoroughly with soap and water. 2. You may remove the patch earlier than 72 hours if you experience unpleasant side effects which may include dry mouth, dizziness or visual disturbances. 3. Avoid touching the patch. Wash your hands with soap and water after contact with the patch.

## 2024-07-08 NOTE — Anesthesia Procedure Notes (Signed)
 Procedure Name: LMA Insertion Date/Time: 07/08/2024 7:39 AM  Performed by: Donnell Berwyn SQUIBB, CRNAPre-anesthesia Checklist: Patient identified, Emergency Drugs available, Suction available, Patient being monitored and Timeout performed Patient Re-evaluated:Patient Re-evaluated prior to induction Oxygen Delivery Method: Circle system utilized Preoxygenation: Pre-oxygenation with 100% oxygen Induction Type: IV induction Ventilation: Mask ventilation without difficulty LMA: LMA inserted LMA Size: 4.0 Number of attempts: 1 Placement Confirmation: positive ETCO2 and breath sounds checked- equal and bilateral Tube secured with: Tape Dental Injury: Teeth and Oropharynx as per pre-operative assessment

## 2024-07-08 NOTE — Anesthesia Procedure Notes (Signed)
 Anesthesia Regional Block: Popliteal block   Pre-Anesthetic Checklist: , timeout performed,  Correct Patient, Correct Site, Correct Laterality,  Correct Procedure, Correct Position, site marked,  Risks and benefits discussed,  Surgical consent,  Pre-op evaluation,  At surgeon's request and post-op pain management  Laterality: Right  Prep: chloraprep       Needles:  Injection technique: Single-shot  Needle Type: Echogenic Stimulator Needle     Needle Length: 9cm  Needle Gauge: 21     Additional Needles:   Procedures:,,,, ultrasound used (permanent image in chart),,    Narrative:  Start time: 07/08/2024 7:13 AM End time: 07/08/2024 7:17 AM Injection made incrementally with aspirations every 5 mL.  Performed by: Personally  Anesthesiologist: Peggye Delon Brunswick, MD  Additional Notes: Patient with pre-block numbness on dorsal aspect of big toe extending down the first metatarsal.  Discussed risks and benefits of nerve block including, but not limited to, prolonged and/or permanent nerve injury involving sensory and/or motor function. Monitors were applied and a time-out was performed. The nerve and associated structures were visualized under ultrasound guidance. After negative aspiration, local anesthetic was slowly injected around the nerve. There was no evidence of high pressure during the procedure. There were no paresthesias. VSS remained stable and the patient tolerated the procedure well.

## 2024-07-08 NOTE — Progress Notes (Signed)
 Assisted Dr. Ardeen Jourdain with right, popliteal/saphenous, ultrasound guided block. Side rails up, monitors on throughout procedure. See vital signs in flow sheet. Tolerated Procedure well.

## 2024-07-08 NOTE — Anesthesia Procedure Notes (Addendum)
 Anesthesia Regional Block: Adductor canal block   Pre-Anesthetic Checklist: , timeout performed,  Correct Patient, Correct Site, Correct Laterality,  Correct Procedure, Correct Position, site marked,  Risks and benefits discussed,  Surgical consent,  Pre-op evaluation,  At surgeon's request and post-op pain management  Laterality: Right  Prep: chloraprep       Needles:  Injection technique: Single-shot  Needle Type: Echogenic Stimulator Needle     Needle Length: 9cm  Needle Gauge: 21     Additional Needles:   Procedures:,,,, ultrasound used (permanent image in chart),,    Narrative:  Start time: 07/08/2024 10:37 AM End time: 07/08/2024 10:41 AM Injection made incrementally with aspirations every 5 mL.  Performed by: Personally  Anesthesiologist: Peggye Delon Brunswick, MD  Additional Notes: Patient with pain on the medial ankle in PACU. Repeat adductor canal block.  Discussed risks and benefits of nerve block including, but not limited to, prolonged and/or permanent nerve injury involving sensory and/or motor function. Monitors were applied and a time-out was performed. The nerve and associated structures were visualized under ultrasound guidance. After negative aspiration, local anesthetic was slowly injected around the nerve. There was no evidence of high pressure during the procedure. There were no paresthesias. VSS remained stable and the patient tolerated the procedure well.

## 2024-07-08 NOTE — Op Note (Signed)
 07/08/2024  10:00 AM  PATIENT:  Melissa Pineda  38 y.o. female  PRE-OPERATIVE DIAGNOSIS: 1.  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  Right fibula fracture malunion  POST-OPERATIVE DIAGNOSIS: Same  Procedure(s): 1.  Open treatment of right tibia fracture nonunion with internal fixation 2.  Lateral closing wedge osteotomy of the fibula fracture malunion with internal fixation 3.  AP, mortise and lateral radiographs of the right ankle  SURGEON:  Norleen Armor, MD  ASSISTANT: Eva Barrack, PA-C  ANESTHESIA:   General, regional  EBL:  minimal   TOURNIQUET:   Total Tourniquet Time Documented: Thigh (Right) - 104 minutes Total: Thigh (Right) - 104 minutes  COMPLICATIONS:  None apparent  DISPOSITION:  Extubated, awake and stable to recovery.  INDICATION FOR PROCEDURE: 38 year old female with past medical history significant for lung cancer on chronic targeted chemotherapy.  She developed a stress fracture of her distal tibia.  Initially she had appropriate alignment but progressively developed a varus malalignment.  The CT scan reveals a nonunion.  She fractured her lateral malleolus and has gone on to heal with a varus malunion.  She has failed nonoperative treatment and presents now for surgical treatment.  She has been off of Xarelto  for 2 days.  The risks and benefits of the alternative treatment options have been discussed in detail.  The patient wishes to proceed with surgery and specifically understands risks of bleeding, infection, nerve damage, blood clots, need for additional surgery, amputation and death.   PROCEDURE IN DETAIL:  After pre operative consent was obtained, and the correct operative site was identified, the patient was brought to the operating room and placed supine on the OR table.  Anesthesia was administered.  Pre-operative antibiotics were administered.  A surgical timeout was taken.  The right lower extremity was prepped and draped in standard  sterile fashion with a tourniquet applied.  The extremity was elevated, and the tourniquet was inflated to 250 mmHg.  An incision was made over the medial ankle.  Dissection was carried sharply down through the subcutaneous tissues.  Care was taken to protect the saphenous vein and nerve.  The fracture site was identified.  Periosteum was incised and elevated exposing the nonunion.  All fibrous tissue and devitalized bone was removed with curettes and a rondure.  A pin distractor was placed in the tibia proximally and distally.  The fracture site was opened correcting the malalignment partially.  A radiograph confirmed that the fibular malunion was blocking correction of the varus malalignment of the tibia.  Attention was turned to the lateral ankle.  A longitudinal incision was made over the lateral malleolus.  Dissection was carried sharply down through the subcutaneous tissues.  The apex of the angular deformity was identified.  The bone appeared healed.  The osteotomy was marked with K wires.  An oscillating saw was used to cut the wedge of bone out of the lateral malleolus laterally.  It was irrigated copiously.  Both sides of the osteotomy site were perforated with a small drill bit.  The osteotomy site was closed down.  An anatomic plate from the [employer] locking small frag set was selected.  It was secured to the lateral malleolus distally with accommodation of locking and nonlocking screws.  The plate was then used as a reduction tool to compress the osteotomy site.  The plate was then secured proximally with locking and nonlocking bicortical screws.  AP and lateral radiographs confirmed appropriate correction of the fibular malunion in appropriate  position and length of all hardware.  Attention was returned to the tibial nonunion site.  The wound was again irrigated copiously.  A small drill bit was used to perforate both sides of the joint.  Augment rhBMP-2 and cancellous allograft chips were  packed into the nonunion site.  A medial tibial plate was selected.  It was placed over the osteotomy site and secured distally.  Radiographs confirmed appropriate position of the plate.  It was then secured to the distal tibia with locking and nonlocking screws.  The plate was then used as a reduction tool.  The  varus malalignment was corrected.  The plate was then secured proximally with locking and nonlocking bicortical screws.  Additional bone graft was packed into the nonunion site.  The remaining rhBMP-2 was injected into the nonunion site.  Final AP, mortise and lateral radiographs confirmed appropriate position of the hardware and appropriate correction of the tibial and fibular deformities.  Wounds were irrigated copiously and sprinkled with vancomycin  powder.  Subcutaneous tissues were approximated with 2-0 Vicryl.  Skin incisions were closed with staples.  Sterile dressings were applied followed by well-padded short leg splint.  Tourniquet was released after application of dressings.  Patient was awakened from anesthesia and transported to the recovery room in stable condition.   FOLLOW UP PLAN: Nonweightbearing on the the right lower extremity.  Xarelto  for DVT prophylaxis starting on postop day 1.  Follow-up with me in the office in 2 weeks for removal of the staples and placement in a short leg cast.  Anticipate nonweightbearing for 3 months postop.   RADIOGRAPHS: AP, lateral and mortise radiographs of the right ankle are obtained intraoperatively.  These show interval correction of the tibial nonunion of the fibular malunion with a closing wedge osteotomy of the fibula and open treatment of the nonunion with bone grafting and medial plating at the tibia.  Hardware is in appropriate position and in the appropriate lengths.  No other acute injuries are noted.    Justin Ollis PA-C was present and scrubbed for the duration of the operative case. His assistance was essential in positioning  the patient, prepping and draping, gaining and maintaining exposure, performing the operation, closing and dressing the wounds and applying the splint.

## 2024-07-08 NOTE — Transfer of Care (Signed)
 Immediate Anesthesia Transfer of Care Note  Patient: Melissa Pineda  Procedure(s) Performed: open reduction internal fixation right tibia fracture nonunion (Right: Ankle) Right fibula osteotomy (Right: Leg Lower)  Patient Location: PACU  Anesthesia Type:General  Level of Consciousness: awake, alert , oriented, and patient cooperative  Airway & Oxygen Therapy: Patient Spontanous Breathing and Patient connected to nasal cannula oxygen  Post-op Assessment: Report given to RN and Post -op Vital signs reviewed and stable  Post vital signs: Reviewed and stable  Last Vitals:  Vitals Value Taken Time  BP 120/57 07/08/24 09:47  Temp    Pulse 89 07/08/24 09:52  Resp 11 07/08/24 09:52  SpO2 99 % 07/08/24 09:52  Vitals shown include unfiled device data.  Last Pain:  Vitals:   07/08/24 0730  TempSrc:   PainSc: 0-No pain      Patients Stated Pain Goal: 5 (07/08/24 9352)  Complications: No notable events documented.

## 2024-07-08 NOTE — Progress Notes (Signed)
 Assisted Dr. Ardeen Jourdain with right, adductor canal, ultrasound guided block. Side rails up, monitors on throughout procedure. See vital signs in flow sheet. Tolerated Procedure well.

## 2024-07-08 NOTE — Anesthesia Postprocedure Evaluation (Signed)
 Anesthesia Post Note  Patient: Melissa Pineda  Procedure(s) Performed: open reduction internal fixation right tibia fracture nonunion (Right: Ankle) Right fibula osteotomy (Right: Leg Lower)     Patient location during evaluation: PACU Anesthesia Type: General and Regional Level of consciousness: awake Pain management: pain level controlled Vital Signs Assessment: post-procedure vital signs reviewed and stable Respiratory status: spontaneous breathing, nonlabored ventilation and respiratory function stable Cardiovascular status: blood pressure returned to baseline and stable Postop Assessment: no apparent nausea or vomiting Anesthetic complications: no   No notable events documented.  Last Vitals:  Vitals:   07/08/24 1045 07/08/24 1100  BP: 119/75 107/62  Pulse: 80 77  Resp: 12 15  Temp:    SpO2: 96% 98%    Last Pain:  Vitals:   07/08/24 1100  TempSrc:   PainSc: 0-No pain        RLE Motor Response: No movement due to regional block (07/08/24 1100) RLE Sensation: No sensation (absent) (07/08/24 1100)      Delon Aisha Arch

## 2024-07-08 NOTE — Anesthesia Procedure Notes (Signed)
 Anesthesia Regional Block: Adductor canal block   Pre-Anesthetic Checklist: , timeout performed,  Correct Patient, Correct Site, Correct Laterality,  Correct Procedure, Correct Position, site marked,  Risks and benefits discussed,  Surgical consent,  Pre-op evaluation,  At surgeon's request and post-op pain management  Laterality: Right  Prep: chloraprep       Needles:  Injection technique: Single-shot  Needle Type: Echogenic Stimulator Needle     Needle Length: 9cm  Needle Gauge: 21     Additional Needles:   Procedures:,,,, ultrasound used (permanent image in chart),,    Narrative:  Start time: 07/08/2024 7:18 AM End time: 07/08/2024 7:21 AM Injection made incrementally with aspirations every 5 mL.  Performed by: Personally  Anesthesiologist: Peggye Delon Brunswick, MD  Additional Notes: Patient with pre-block numbness on dorsal aspect of big toe extending down the first metatarsal.  Discussed risks and benefits of nerve block including, but not limited to, prolonged and/or permanent nerve injury involving sensory and/or motor function. Monitors were applied and a time-out was performed. The nerve and associated structures were visualized under ultrasound guidance. After negative aspiration, local anesthetic was slowly injected around the nerve. There was no evidence of high pressure during the procedure. There were no paresthesias. VSS remained stable and the patient tolerated the procedure well.

## 2024-07-09 ENCOUNTER — Encounter (HOSPITAL_BASED_OUTPATIENT_CLINIC_OR_DEPARTMENT_OTHER): Payer: Self-pay | Admitting: Orthopedic Surgery

## 2024-07-09 ENCOUNTER — Inpatient Hospital Stay

## 2024-07-09 DIAGNOSIS — K5900 Constipation, unspecified: Secondary | ICD-10-CM | POA: Diagnosis not present

## 2024-07-09 DIAGNOSIS — Z7901 Long term (current) use of anticoagulants: Secondary | ICD-10-CM | POA: Diagnosis not present

## 2024-07-09 DIAGNOSIS — C3492 Malignant neoplasm of unspecified part of left bronchus or lung: Secondary | ICD-10-CM

## 2024-07-09 DIAGNOSIS — Z793 Long term (current) use of hormonal contraceptives: Secondary | ICD-10-CM | POA: Diagnosis not present

## 2024-07-09 DIAGNOSIS — C7951 Secondary malignant neoplasm of bone: Secondary | ICD-10-CM | POA: Diagnosis not present

## 2024-07-09 DIAGNOSIS — Z7982 Long term (current) use of aspirin: Secondary | ICD-10-CM | POA: Diagnosis not present

## 2024-07-09 DIAGNOSIS — Z7952 Long term (current) use of systemic steroids: Secondary | ICD-10-CM | POA: Diagnosis not present

## 2024-07-09 DIAGNOSIS — Z79899 Other long term (current) drug therapy: Secondary | ICD-10-CM | POA: Diagnosis not present

## 2024-07-09 DIAGNOSIS — Z7902 Long term (current) use of antithrombotics/antiplatelets: Secondary | ICD-10-CM | POA: Diagnosis not present

## 2024-07-09 LAB — CBC WITH DIFFERENTIAL (CANCER CENTER ONLY)
Abs Immature Granulocytes: 0.04 K/uL (ref 0.00–0.07)
Basophils Absolute: 0 K/uL (ref 0.0–0.1)
Basophils Relative: 0 %
Eosinophils Absolute: 0 K/uL (ref 0.0–0.5)
Eosinophils Relative: 0 %
HCT: 31.7 % — ABNORMAL LOW (ref 36.0–46.0)
Hemoglobin: 10.3 g/dL — ABNORMAL LOW (ref 12.0–15.0)
Immature Granulocytes: 0 %
Lymphocytes Relative: 14 %
Lymphs Abs: 1.3 K/uL (ref 0.7–4.0)
MCH: 30.1 pg (ref 26.0–34.0)
MCHC: 32.5 g/dL (ref 30.0–36.0)
MCV: 92.7 fL (ref 80.0–100.0)
Monocytes Absolute: 1 K/uL (ref 0.1–1.0)
Monocytes Relative: 11 %
Neutro Abs: 6.9 K/uL (ref 1.7–7.7)
Neutrophils Relative %: 75 %
Platelet Count: 484 K/uL — ABNORMAL HIGH (ref 150–400)
RBC: 3.42 MIL/uL — ABNORMAL LOW (ref 3.87–5.11)
RDW: 13.1 % (ref 11.5–15.5)
WBC Count: 9.3 K/uL (ref 4.0–10.5)
nRBC: 0 % (ref 0.0–0.2)

## 2024-07-09 LAB — CMP (CANCER CENTER ONLY)
ALT: 13 U/L (ref 0–44)
AST: 17 U/L (ref 15–41)
Albumin: 4.3 g/dL (ref 3.5–5.0)
Alkaline Phosphatase: 98 U/L (ref 38–126)
Anion gap: 7 (ref 5–15)
BUN: 7 mg/dL (ref 6–20)
CO2: 28 mmol/L (ref 22–32)
Calcium: 8.9 mg/dL (ref 8.9–10.3)
Chloride: 107 mmol/L (ref 98–111)
Creatinine: 0.68 mg/dL (ref 0.44–1.00)
GFR, Estimated: >60 mL/min (ref >60)
Glucose, Bld: 127 mg/dL — ABNORMAL HIGH (ref 70–99)
Potassium: 3.5 mmol/L (ref 3.5–5.1)
Sodium: 142 mmol/L (ref 135–145)
Total Bilirubin: 0.5 mg/dL (ref 0.0–1.2)
Total Protein: 7.4 g/dL (ref 6.5–8.1)

## 2024-07-09 LAB — IRON AND IRON BINDING CAPACITY (CC-WL,HP ONLY)
Iron: 18 ug/dL — ABNORMAL LOW (ref 28–170)
Saturation Ratios: 6 % — ABNORMAL LOW (ref 10.4–31.8)
TIBC: 294 ug/dL (ref 250–450)
UIBC: 276 ug/dL (ref 148–442)

## 2024-07-09 LAB — FERRITIN: Ferritin: 94 ng/mL (ref 11–307)

## 2024-07-09 LAB — SAMPLE TO BLOOD BANK

## 2024-07-12 ENCOUNTER — Encounter: Payer: Self-pay | Admitting: Medical Oncology

## 2024-07-12 ENCOUNTER — Telehealth: Payer: Self-pay | Admitting: Medical Oncology

## 2024-07-12 NOTE — Telephone Encounter (Signed)
 LVM that I sent the return to work letter via [employer] . If she needs a signed copy I have it and can fax it , mail it , just let me know.

## 2024-07-13 ENCOUNTER — Encounter (HOSPITAL_BASED_OUTPATIENT_CLINIC_OR_DEPARTMENT_OTHER): Payer: Self-pay | Admitting: Orthopedic Surgery

## 2024-07-13 ENCOUNTER — Other Ambulatory Visit: Payer: Self-pay | Admitting: Physician Assistant

## 2024-07-13 DIAGNOSIS — D509 Iron deficiency anemia, unspecified: Secondary | ICD-10-CM | POA: Insufficient documentation

## 2024-07-14 ENCOUNTER — Telehealth: Payer: Self-pay | Admitting: Pharmacy Technician

## 2024-07-14 ENCOUNTER — Telehealth: Payer: Self-pay | Admitting: Physician Assistant

## 2024-07-14 DIAGNOSIS — D509 Iron deficiency anemia, unspecified: Secondary | ICD-10-CM

## 2024-07-14 NOTE — Telephone Encounter (Signed)
 Auth Submission: NO AUTH NEEDED Site of care: Site of care: [facility] Payer: [employer] [place] Medication & CPT/J Code(s) submitted: Venofer (Iron Sucrose) J1756 Diagnosis Code:  Route of submission (phone, fax, portal): PORTAL Phone # Fax # Auth type: Buy/Bill PB Units/visits requested: X3 DOSES Reference number:  Approval from: 07/14/24 to 09/19/24

## 2024-07-14 NOTE — Telephone Encounter (Signed)
 I called the patient to review her iron studies from last week. She is compliant with her iron supplement. We discussed the pros and cons of trailing out IV iron. Her ferritin was WNL but since it is an acute phase reactant, difficult to say if her ferritin is accurate reflection of iron stores. We will proceed with iron infusions for 2-3 doses with venofer 200 mg. I will order this at the [address] infusion center.

## 2024-07-29 ENCOUNTER — Ambulatory Visit (INDEPENDENT_AMBULATORY_CARE_PROVIDER_SITE_OTHER)

## 2024-07-29 VITALS — BP 123/67 | HR 71 | Temp 98.1°F | Resp 16 | Ht 66.0 in | Wt 183.4 lb

## 2024-07-29 DIAGNOSIS — D509 Iron deficiency anemia, unspecified: Secondary | ICD-10-CM | POA: Diagnosis not present

## 2024-07-29 MED ORDER — ACETAMINOPHEN 325 MG PO TABS: 650.0000 mg | ORAL_TABLET | Freq: Once | ORAL | Status: DC

## 2024-07-29 MED ORDER — IRON SUCROSE 20 MG/ML IV SOLN
200.0000 mg | Freq: Once | INTRAVENOUS | Status: AC
  Administered 2024-07-29: 200 mg via INTRAVENOUS
  Filled 2024-07-29: qty 10

## 2024-07-29 MED ORDER — DIPHENHYDRAMINE HCL 25 MG PO CAPS: 25.0000 mg | ORAL_CAPSULE | Freq: Once | ORAL | Status: DC

## 2024-07-29 NOTE — Progress Notes (Signed)
 Diagnosis: Iron Deficiency Anemia  Provider:  Chilton Greathouse MD  Procedure: IV Push  IV Type: Peripheral, IV Location: L Antecubital  Venofer (Iron Sucrose), Dose: 200 mg  Post Infusion IV Care: Observation period completed and Peripheral IV Discontinued  Discharge: Condition: Good, Destination: Home . AVS Provided  Performed by:  Wyvonne Lenz, RN

## 2024-08-02 ENCOUNTER — Other Ambulatory Visit: Payer: Self-pay | Admitting: Physician Assistant

## 2024-08-02 ENCOUNTER — Ambulatory Visit (HOSPITAL_COMMUNITY)
Admission: RE | Admit: 2024-08-02 | Discharge: 2024-08-02 | Disposition: A | Discharge status: Home / Self Care | Source: Ambulatory Visit | Attending: Physician Assistant | Admitting: Physician Assistant

## 2024-08-02 DIAGNOSIS — C3492 Malignant neoplasm of unspecified part of left bronchus or lung: Secondary | ICD-10-CM

## 2024-08-02 DIAGNOSIS — Z975 Presence of (intrauterine) contraceptive device: Secondary | ICD-10-CM | POA: Diagnosis not present

## 2024-08-02 MED ORDER — SODIUM CHLORIDE (PF) 0.9 % IJ SOLN
INTRAMUSCULAR | Status: AC
  Filled 2024-08-02: qty 50

## 2024-08-02 MED ORDER — IOHEXOL 300 MG/ML  SOLN
100.0000 mL | Freq: Once | INTRAMUSCULAR | Status: AC | PRN
  Administered 2024-08-02: 100 mL via INTRAVENOUS

## 2024-08-05 ENCOUNTER — Ambulatory Visit (INDEPENDENT_AMBULATORY_CARE_PROVIDER_SITE_OTHER)

## 2024-08-05 VITALS — BP 113/72 | HR 64 | Temp 97.7°F | Resp 16 | Ht 66.0 in | Wt 182.6 lb

## 2024-08-05 DIAGNOSIS — D509 Iron deficiency anemia, unspecified: Secondary | ICD-10-CM | POA: Diagnosis not present

## 2024-08-05 MED ORDER — ACETAMINOPHEN 325 MG PO TABS
650.0000 mg | ORAL_TABLET | Freq: Once | ORAL | Status: DC
  Filled 2024-08-05: qty 2

## 2024-08-05 MED ORDER — IRON SUCROSE 20 MG/ML IV SOLN
200.0000 mg | Freq: Once | INTRAVENOUS | Status: AC
  Administered 2024-08-05: 200 mg via INTRAVENOUS
  Filled 2024-08-05: qty 10

## 2024-08-05 MED ORDER — DIPHENHYDRAMINE HCL 25 MG PO CAPS
25.0000 mg | ORAL_CAPSULE | Freq: Once | ORAL | Status: DC
  Filled 2024-08-05: qty 1

## 2024-08-05 NOTE — Progress Notes (Signed)
 Diagnosis: Iron  Deficiency Anemia  Provider:  Praveen Mannam MD  Procedure: IV Push  IV Type: Peripheral, IV Location: L Antecubital  Venofer  (Iron  Sucrose), Dose: 200 mg  Post Infusion IV Care: Observation period completed and Peripheral IV Discontinued  Discharge: Condition: Good, Destination: Home . AVS Declined  Performed by:  Maximiano JONELLE Pouch, LPN

## 2024-08-06 NOTE — Progress Notes (Unsigned)
 [facility] OFFICE PROGRESS NOTE  Patient, No Pcp Per No address on file  DIAGNOSIS: Stage IVB (T4, N3, M1b) non-small cell lung cancer, adenocarcinoma presented with large left diaphragmatic surface mass in addition to left hilar, infrahilar, AP window, right and left paratracheal, subcarinal as well as left internal mammary lymphadenopathy in addition to right gastric lymph node and solitary Small right frontal calvarial osseous metastatic disease with large malignant left pleural effusion diagnosed in January 2024.     Molecular studies by [employer] as well as [employer] blood test showed:    EZR-ROS1 fusion approved by FDA Crizotinib, Entrectinib, Repotrectinib    approved in other indication Ceritinib, Lorlatinib Yes      3.6%   PD-L1 expression by [employer] that was 98%.  PRIOR THERAPY:  1) Status post left Pleurx catheter placement for drainage of recurrent left pleural effusion. This was removed on December 17, 2022.  2) IV iron PRN, most recent dose on 08/05/24 with venofer 200 mg.   CURRENT THERAPY: Repotrectinib  (Augtyro ) 160 mg p.o. daily for the first 2 weeks and then 160 mg p.o. twice daily, first dose November 27, 2022.    INTERVAL HISTORY: Nhyla [age] y.o. female returns  to the clinic today for a follow up visit.The patient is on Augtyro  for lung cancer. She is tolerating this well.   She was last seen in the clinic on 06/28/24. IN the interval since last being seen, she underwent surgery for her tibia and fibula on 07/08/24. She tolerated that ***.   Her energy is ***. She is taking an iron supplement and a blood thinner. No unusual bleeding or bruising.   She underwent IV iron infusion at [address] on 08/05/24 and tolerated it ***. She is scheduled for another infusion ***.   No recent infections, fevers, chills, night sweats, nausea, vomiting, or changes in appetite. She experiences constipation, which she attributes to her iron  supplement and possibly other medications, and manages it with Miralax  and increased fiber intake.  She recently had a restaging CT scan of the CAP.    She is here today for evaluation and to review her scan results.   MEDICAL HISTORY: Past Medical History:  Diagnosis Date   Anxiety    [employer] 11/21/2020   PAD (peripheral artery disease)    Managed by Dr Magda   PFO (patent foramen ovale)    Dr Wonda   Stage IV adenocarcinoma of lung, unspecified laterality (HCC)     ALLERGIES:  is allergic to hydrocodone , tramadol , and iodinated contrast media.  MEDICATIONS:  Current Outpatient Medications  Medication Sig Dispense Refill   acetaminophen  (TYLENOL ) 325 MG tablet Take 650 mg by mouth every 6 (six) hours as needed for mild pain.     ALPRAZolam  (XANAX ) 0.5 MG tablet Take 1 tablet (0.5 mg total) by mouth 3 (three) times daily as needed for anxiety. 60 tablet 0   ascorbic acid (VITAMIN C) 500 MG tablet Take 500 mg by mouth daily.     b complex vitamins capsule Take 1 capsule by mouth daily.     Cholecalciferol (VITAMIN D-3 PO) Take 2,000 Units by mouth daily.     docusate sodium  (COLACE) 100 MG capsule Take 1 capsule (100 mg total) by mouth 2 (two) times daily. While taking narcotic pain medicine. 30 capsule 0   Ferrous Sulfate (IRON HIGH-POTENCY) 142 (45 Fe) MG TBCR Take 142 mg by mouth daily.     levonorgestrel  (MIRENA , 52 MG,) 20  MCG/DAY IUD 1 each by Intrauterine route once.     metoCLOPramide  (REGLAN ) 5 MG tablet Take 1 tablet (5 mg total) by mouth every 6 (six) hours as needed for nausea. 30 tablet 1   ondansetron  (ZOFRAN -ODT) 8 MG disintegrating tablet Take 1 tablet (8 mg total) by mouth every 8 (eight) hours as needed for nausea or vomiting. 20 tablet 1   polyethylene glycol (MIRALAX  / GLYCOLAX ) 17 g packet Take 17 g by mouth daily as needed for moderate constipation.     predniSONE  (DELTASONE ) 50 MG tablet Take 1 tablet (50 mg total) by mouth as directed. Take one tablet 13  hours , 1 tablet 7 hours and 1 tablet 1 hour prior to CT scan with IV contrast. Take Benadryl  50 mg po 1 hour before CT scan (Patient not taking: Reported on 07/01/2024) 3 tablet 5   repotrectinib  (AUGTYRO ) 40 MG capsule Take 4 tablets (160 mg) twice a day 240 capsule 2   senna (SENOKOT) 8.6 MG TABS tablet Take 2 tablets (17.2 mg total) by mouth 2 (two) times daily. 30 tablet 0   XARELTO  20 MG TABS tablet TAKE 1 TABLET(20 MG) BY MOUTH DAILY WITH SUPPER 90 tablet 1   No current facility-administered medications for this visit.    SURGICAL HISTORY:  Past Surgical History:  Procedure Laterality Date   ABDOMINAL AORTOGRAM W/LOWER EXTREMITY N/A 11/27/2022   Procedure: ABDOMINAL AORTOGRAM W/LOWER EXTREMITY;  Surgeon: Magda Debby SAILOR, MD;  Location: [facility];  Service: Cardiovascular;  Laterality: N/A;   IR PERC PLEURAL DRAIN W/INDWELL CATH W/IMG GUIDE  11/09/2022   ORIF TIBIA FRACTURE Right 07/08/2024   Procedure: Open reduction internal fixation right tibia fracture nonunion;  Surgeon: Kit Rush, MD;  Location: [facility];  Service: Orthopedics;  Laterality: Right;   PERIPHERAL VASCULAR ATHERECTOMY  11/27/2022   Procedure: PERIPHERAL VASCULAR ATHERECTOMY;  Surgeon: Magda Debby SAILOR, MD;  Location: [facility];  Service: Cardiovascular;;   PERIPHERAL VASCULAR THROMBECTOMY  11/27/2022   Procedure: PERIPHERAL VASCULAR THROMBECTOMY;  Surgeon: Magda Debby SAILOR, MD;  Location: [facility];  Service: Cardiovascular;;   REMOVAL OF PLEURAL DRAINAGE CATHETER N/A 12/17/2022   Procedure: MINOR REMOVAL OF PLEURAL DRAINAGE CATHETER;  Surgeon: Brenna Adine CROME, DO;  Location: [facility];  Service: Cardiopulmonary;  Laterality: N/A;   TEE WITHOUT CARDIOVERSION N/A 06/10/2023   Procedure: TRANSESOPHAGEAL ECHOCARDIOGRAM;  Surgeon: Raford Riggs, MD;  Location: [facility];  Service: Cardiovascular;  Laterality: N/A;   TIBIA OSTEOTOMY Right 07/08/2024   Procedure: Lateral  closing wedge osteotomy right fibula fracture malunion with internal fixation;  Surgeon: Kit Rush, MD;  Location: [facility];  Service: Orthopedics;  Laterality: Right;   WISDOM TOOTH EXTRACTION      REVIEW OF SYSTEMS:   Review of Systems  Constitutional: Negative for appetite change, chills, fatigue, fever and unexpected weight change.  HENT:   Negative for mouth sores, nosebleeds, sore throat and trouble swallowing.   Eyes: Negative for eye problems and icterus.  Respiratory: Negative for cough, hemoptysis, shortness of breath and wheezing.   Cardiovascular: Negative for chest pain and leg swelling.  Gastrointestinal: Negative for abdominal pain, constipation, diarrhea, nausea and vomiting.  Genitourinary: Negative for bladder incontinence, difficulty urinating, dysuria, frequency and hematuria.   Musculoskeletal: Negative for back pain, gait problem, neck pain and neck stiffness.  Skin: Negative for itching and rash.  Neurological: Negative for dizziness, extremity weakness, gait problem, headaches, light-headedness and seizures.  Hematological:  Negative for adenopathy. Does not bruise/bleed easily.  Psychiatric/Behavioral: Negative for confusion, depression and sleep disturbance. The patient is not nervous/anxious.     PHYSICAL EXAMINATION:  There were no vitals taken for this visit.  ECOG PERFORMANCE STATUS: {CHL ONC ECOG [id number]  Physical Exam  Constitutional: Oriented to person, place, and time and well-developed, well-nourished, and in no distress. No distress.  HENT:  Head: Normocephalic and atraumatic.  Mouth/Throat: Oropharynx is clear and moist. No oropharyngeal exudate.  Eyes: Conjunctivae are normal. Right eye exhibits no discharge. Left eye exhibits no discharge. No scleral icterus.  Neck: Normal range of motion. Neck supple.  Cardiovascular: Normal rate, regular rhythm, normal heart sounds and intact distal pulses.   Pulmonary/Chest: Effort  normal and breath sounds normal. No respiratory distress. No wheezes. No rales.  Abdominal: Soft. Bowel sounds are normal. Exhibits no distension and no mass. There is no tenderness.  Musculoskeletal: Normal range of motion. Exhibits no edema.  Lymphadenopathy:    No cervical adenopathy.  Neurological: Alert and oriented to person, place, and time. Exhibits normal muscle tone. Gait normal. Coordination normal.  Skin: Skin is warm and dry. No rash noted. Not diaphoretic. No erythema. No pallor.  Psychiatric: Mood, memory and judgment normal.  Vitals reviewed.  LABORATORY DATA: Lab Results  Component Value Date   WBC 9.3 07/09/2024   HGB 10.3 (L) 07/09/2024   HCT 31.7 (L) 07/09/2024   MCV 92.7 07/09/2024   PLT 484 (H) 07/09/2024      Chemistry      Component Value Date/Time   NA 142 07/09/2024 1022   NA 142 05/26/2023 0938   K 3.5 07/09/2024 1022   CL 107 07/09/2024 1022   CO2 28 07/09/2024 1022   BUN 7 07/09/2024 1022   BUN 17 05/26/2023 0938   CREATININE 0.68 07/09/2024 1022      Component Value Date/Time   CALCIUM 8.9 07/09/2024 1022   ALKPHOS 98 07/09/2024 1022   AST 17 07/09/2024 1022   ALT 13 07/09/2024 1022   BILITOT 0.5 07/09/2024 1022       RADIOGRAPHIC STUDIES:  CT CHEST ABDOMEN PELVIS W CONTRAST Result Date: 08/03/2024 CLINICAL DATA:  History of non-small cell lung cancer, restaging. * Tracking Code: BO * EXAM: CT CHEST, ABDOMEN, AND PELVIS WITH CONTRAST TECHNIQUE: Multidetector CT imaging of the chest, abdomen and pelvis was performed following the standard protocol during bolus administration of intravenous contrast. RADIATION DOSE REDUCTION: This exam was performed according to the departmental dose-optimization program which includes automated exposure control, adjustment of the mA and/or kV according to patient size and/or use of iterative reconstruction technique. CONTRAST:  OMNIPAQUE  IOHEXOL  300 MG/ML  SOLN COMPARISON:  Multiple priors including CT  April 02, 2024. FINDINGS: CT CHEST FINDINGS Cardiovascular: Normal caliber thoracic aorta. Normal size heart. No significant pericardial effusion/thickening. Mediastinum/Nodes: Similar crescentic soft tissue in the anterior mediastinum which conforms to underlying vasculature and is favored thymic rebound. No pathologically enlarged mediastinal, hilar or axillary lymph nodes. The esophagus is grossly unremarkable. Lungs/Pleura: Similar irregular opacity in the infrahilar left lower lobe measuring 2.0 x 0.9 cm on image 105/4 previously 2.3 x 0.9 cm. There is similar adjacent interstitial thickening with some ground-glass and architectural distortion as well as a locking trace loculated pleural effusion. Constellation of findings is most compatible with posttreatment change without evidence of local recurrence. No new suspicious pulmonary nodules or masses. Musculoskeletal: No aggressive lytic or blastic lesion of bone. CT ABDOMEN PELVIS FINDINGS Hepatobiliary: No suspicious hepatic lesion.  Gallbladder is nondistended. No biliary ductal dilation. Pancreas: No pancreatic ductal dilation or evidence of acute inflammation. Spleen: No splenomegaly. Adrenals/Urinary Tract: No suspicious adrenal nodule/mass. Similar bilateral caliectasis. Lobular right renal contour. Symmetric bilateral renal enhancement. Urinary bladder is unremarkable for degree of distension. Stomach/Bowel: Stomach is within normal limits. Appendix appears normal. No evidence of bowel wall thickening, distention, or inflammatory changes. Vascular/Lymphatic: No significant vascular findings are present. No enlarged abdominal or pelvic lymph nodes. Reproductive: Intrauterine device in place. Other: No significant abdominopelvic free fluid. Musculoskeletal: No aggressive lytic or blastic lesion of bone. IMPRESSION: 1. Similar posttreatment appearance in the left lung base without convincing evidence of local recurrence. 2. No evidence of metastatic disease  in the chest, abdomen or pelvis. Electronically Signed   By: Reyes Holder M.D.   On: 08/03/2024 18:13   DG MINI C-ARM IMAGE ONLY Result Date: 07/08/2024 There is no interpretation for this exam.  This order is for images obtained during a surgical procedure.  Please See Surgeries Tab for more information regarding the procedure.     ASSESSMENT/PLAN:  This is a very pleasant 38 year old never smoker Caucasian female diagnosed with stage IV (T4, N3, M1 B) non-small cell lung cancer, adenocarcinoma.  She presented with a large left diaphragmatic surface mass in addition to left hilar, infrahilar, AP window, right and left paratracheal, subcarinal, left internal mammary, and right gastric lymphadenopathy in addition to a solitary small right frontal calvarial osseous metastatic lesion with a large malignant left pleural effusion.  She was diagnosed in January 2024.   Her molecular studies show that she has an actionable mutation with ROS1 fusion.  She also has a PD-L1 expression of 98%.   The patient had bilateral lower extremity occlusion and underwent thrombectomy under the care of Dr. Vonzell.     The patient is currently on targeted treatment with repotrectinib  initially as 160 mg p.o. daily for 2 weeks followed by increase of the dose to 160 mg p.o. twice daily starting on 12/12/22. Her first dose of treatment was on 11/27/22.  She has been tolerating this well. She has been on this for 7 months.    Labs were reviewed and that she continue on the same treatment at the same dose.   The patient was seen with Dr. Sherrod today.  Dr. Sherrod personally and independently reviewed the scan and discussed results with the patient today.  The scan showed ***.  Dr. Sherrod recommends ***  We will see her for labs and follow up in 6 weeks.    Constipation secondary to iron and medication use Constipation attributed to iron supplementation and medication use. Managed with Miralax  and increased dietary  fiber. - Consider adding a stool softener like Colace or Senna for constipation management.  The patient was advised to call immediately if she has any concerning symptoms in the interval. The patient voices understanding of current disease status and treatment options and is in agreement with the current care plan. All questions were answered. The patient knows to call the clinic with any problems, questions or concerns. We can certainly see the patient much sooner if necessary    No orders of the defined types were placed in this encounter.    I spent {CHL ONC TIME VISIT - DTPQU:[id number]} counseling the patient face to face. The total time spent in the appointment was {CHL ONC TIME VISIT - DTPQU:[id number]}.  Johnasia Liese L Nolyn Swab, PA-C 08/06/24

## 2024-08-10 ENCOUNTER — Inpatient Hospital Stay: Attending: Internal Medicine

## 2024-08-10 ENCOUNTER — Inpatient Hospital Stay: Admitting: Physician Assistant

## 2024-08-10 VITALS — BP 120/72 | HR 73 | Temp 97.6°F | Resp 17 | Ht 66.0 in | Wt 182.0 lb

## 2024-08-10 DIAGNOSIS — Z7901 Long term (current) use of anticoagulants: Secondary | ICD-10-CM | POA: Diagnosis not present

## 2024-08-10 DIAGNOSIS — Z7952 Long term (current) use of systemic steroids: Secondary | ICD-10-CM | POA: Diagnosis not present

## 2024-08-10 DIAGNOSIS — Z79891 Long term (current) use of opiate analgesic: Secondary | ICD-10-CM | POA: Insufficient documentation

## 2024-08-10 DIAGNOSIS — C7951 Secondary malignant neoplasm of bone: Secondary | ICD-10-CM | POA: Insufficient documentation

## 2024-08-10 DIAGNOSIS — K5903 Drug induced constipation: Secondary | ICD-10-CM | POA: Insufficient documentation

## 2024-08-10 DIAGNOSIS — C3492 Malignant neoplasm of unspecified part of left bronchus or lung: Secondary | ICD-10-CM | POA: Diagnosis not present

## 2024-08-10 DIAGNOSIS — Z793 Long term (current) use of hormonal contraceptives: Secondary | ICD-10-CM | POA: Insufficient documentation

## 2024-08-10 DIAGNOSIS — D509 Iron deficiency anemia, unspecified: Secondary | ICD-10-CM

## 2024-08-10 LAB — CBC WITH DIFFERENTIAL (CANCER CENTER ONLY)
Abs Immature Granulocytes: 0.03 K/uL (ref 0.00–0.07)
Basophils Absolute: 0.1 K/uL (ref 0.0–0.1)
Basophils Relative: 1 %
Eosinophils Absolute: 0.1 K/uL (ref 0.0–0.5)
Eosinophils Relative: 2 %
HCT: 33.1 % — ABNORMAL LOW (ref 36.0–46.0)
Hemoglobin: 10.7 g/dL — ABNORMAL LOW (ref 12.0–15.0)
Immature Granulocytes: 0 %
Lymphocytes Relative: 34 %
Lymphs Abs: 2.4 K/uL (ref 0.7–4.0)
MCH: 29.8 pg (ref 26.0–34.0)
MCHC: 32.3 g/dL (ref 30.0–36.0)
MCV: 92.2 fL (ref 80.0–100.0)
Monocytes Absolute: 0.6 K/uL (ref 0.1–1.0)
Monocytes Relative: 8 %
Neutro Abs: 4 K/uL (ref 1.7–7.7)
Neutrophils Relative %: 55 %
Platelet Count: 451 K/uL — ABNORMAL HIGH (ref 150–400)
RBC: 3.59 MIL/uL — ABNORMAL LOW (ref 3.87–5.11)
RDW: 14 % (ref 11.5–15.5)
WBC Count: 7.2 K/uL (ref 4.0–10.5)
nRBC: 0 % (ref 0.0–0.2)

## 2024-08-10 LAB — CMP (CANCER CENTER ONLY)
ALT: 17 U/L (ref 0–44)
AST: 19 U/L (ref 15–41)
Albumin: 4.8 g/dL (ref 3.5–5.0)
Alkaline Phosphatase: 96 U/L (ref 38–126)
Anion gap: 7 (ref 5–15)
BUN: 12 mg/dL (ref 6–20)
CO2: 30 mmol/L (ref 22–32)
Calcium: 10.1 mg/dL (ref 8.9–10.3)
Chloride: 108 mmol/L (ref 98–111)
Creatinine: 0.75 mg/dL (ref 0.44–1.00)
GFR, Estimated: >60 mL/min (ref >60)
Glucose, Bld: 102 mg/dL — ABNORMAL HIGH (ref 70–99)
Potassium: 4 mmol/L (ref 3.5–5.1)
Sodium: 145 mmol/L (ref 135–145)
Total Bilirubin: 0.5 mg/dL (ref 0.0–1.2)
Total Protein: 8 g/dL (ref 6.5–8.1)

## 2024-08-10 LAB — CK: Total CK: 101 U/L (ref 38–234)

## 2024-08-10 LAB — FERRITIN: Ferritin: 344 ng/mL — ABNORMAL HIGH (ref 11–307)

## 2024-08-10 LAB — IRON AND IRON BINDING CAPACITY (CC-WL,HP ONLY)
Iron: 118 ug/dL (ref 28–170)
Saturation Ratios: 36 % — ABNORMAL HIGH (ref 10.4–31.8)
TIBC: 328 ug/dL (ref 250–450)
UIBC: 210 ug/dL (ref 148–442)

## 2024-08-12 ENCOUNTER — Ambulatory Visit (INDEPENDENT_AMBULATORY_CARE_PROVIDER_SITE_OTHER)

## 2024-08-12 VITALS — BP 107/69 | HR 60 | Temp 98.1°F | Resp 16 | Ht 66.0 in | Wt 180.0 lb

## 2024-08-12 DIAGNOSIS — D509 Iron deficiency anemia, unspecified: Secondary | ICD-10-CM | POA: Diagnosis not present

## 2024-08-12 MED ORDER — DIPHENHYDRAMINE HCL 25 MG PO CAPS: 25.0000 mg | ORAL_CAPSULE | Freq: Once | ORAL | Status: DC

## 2024-08-12 MED ORDER — IRON SUCROSE 20 MG/ML IV SOLN
200.0000 mg | Freq: Once | INTRAVENOUS | Status: AC
  Administered 2024-08-12: 200 mg via INTRAVENOUS
  Filled 2024-08-12: qty 10

## 2024-08-12 MED ORDER — ACETAMINOPHEN 325 MG PO TABS: 650.0000 mg | ORAL_TABLET | Freq: Once | ORAL | Status: DC

## 2024-08-12 NOTE — Progress Notes (Signed)
 Diagnosis: Iron  Deficiency Anemia  Provider:  Praveen Mannam MD  Procedure: IV Push  IV Type: Peripheral, IV Location: L Antecubital  Venofer  (Iron  Sucrose), Dose: 200 mg  Post Infusion IV Care: Observation period completed and Peripheral IV Discontinued  Discharge: Condition: Good, Destination: Home . AVS Declined  Performed by:  Leita FORBES Miles, LPN

## 2024-08-18 DIAGNOSIS — M25571 Pain in right ankle and joints of right foot: Secondary | ICD-10-CM | POA: Diagnosis not present

## 2024-08-24 ENCOUNTER — Telehealth: Payer: Self-pay

## 2024-08-24 ENCOUNTER — Other Ambulatory Visit (HOSPITAL_COMMUNITY): Payer: Self-pay

## 2024-08-24 ENCOUNTER — Telehealth: Payer: Self-pay | Admitting: *Deleted

## 2024-08-24 NOTE — Telephone Encounter (Signed)
 Oral Oncology Patient Advocate Encounter   Received notification that prior authorization for repotrectinib  (AUGTYRO ) 40 MG capsule  is required.   Called insurance company to expedite PA, and it has been approved 08/24/24-08/24/25.    Lucie Lamer, CPhT [place]  [facility] On[facility] Patient Advocate Specialist [facility] [place] Phone: [phone]  Fax: [phone] [email or web address]

## 2024-08-24 NOTE — Telephone Encounter (Signed)
 Informed pt. that per pharmacy a new authorization was obtained for Repotrectinib . Pt. will get medication refilled now that authorization is completed.

## 2024-09-09 ENCOUNTER — Other Ambulatory Visit: Payer: Self-pay | Admitting: Physician Assistant

## 2024-09-09 DIAGNOSIS — C3492 Malignant neoplasm of unspecified part of left bronchus or lung: Secondary | ICD-10-CM

## 2024-09-22 DIAGNOSIS — S82391K Other fracture of lower end of right tibia, subsequent encounter for closed fracture with nonunion: Secondary | ICD-10-CM | POA: Diagnosis not present

## 2024-09-28 ENCOUNTER — Encounter: Payer: Self-pay | Admitting: Family Medicine

## 2024-09-28 ENCOUNTER — Ambulatory Visit: Admitting: Family Medicine

## 2024-09-28 VITALS — BP 122/78 | HR 80 | Temp 98.4°F | Ht 66.0 in | Wt 187.6 lb

## 2024-09-28 DIAGNOSIS — C3492 Malignant neoplasm of unspecified part of left bronchus or lung: Secondary | ICD-10-CM

## 2024-09-28 DIAGNOSIS — D509 Iron deficiency anemia, unspecified: Secondary | ICD-10-CM | POA: Diagnosis not present

## 2024-09-28 DIAGNOSIS — Z7901 Long term (current) use of anticoagulants: Secondary | ICD-10-CM | POA: Diagnosis not present

## 2024-09-28 DIAGNOSIS — F411 Generalized anxiety disorder: Secondary | ICD-10-CM | POA: Insufficient documentation

## 2024-09-28 DIAGNOSIS — D84821 Immunodeficiency due to drugs: Secondary | ICD-10-CM | POA: Insufficient documentation

## 2024-09-28 DIAGNOSIS — D6859 Other primary thrombophilia: Secondary | ICD-10-CM | POA: Diagnosis not present

## 2024-09-28 DIAGNOSIS — Z23 Encounter for immunization: Secondary | ICD-10-CM

## 2024-09-28 DIAGNOSIS — Z8673 Personal history of transient ischemic attack (TIA), and cerebral infarction without residual deficits: Secondary | ICD-10-CM | POA: Diagnosis not present

## 2024-09-28 DIAGNOSIS — Z7689 Persons encountering health services in other specified circumstances: Secondary | ICD-10-CM

## 2024-09-28 NOTE — Progress Notes (Signed)
 New Patient Visit  Subjective:     Patient ID: Melissa Pineda, female    DOB: 10/05/1986, 38 y.o.   MRN: [id number]  Chief Complaint  Patient presents with   Establish Care    HPI  Discussed the use of AI scribe software for clinical note transcription with the patient, who gave verbal consent to proceed.  History of Present Illness Melissa Pineda is a 38 year old female who presents for a follow-up visit after recent foot surgery.  Postoperative recovery (foot fracture) - Recovering from foot surgery for a fracture sustained after a fall - Initial imaging did not reveal the fracture; symptoms worsened until surgery was performed - Currently has no foot pain - No respiratory symptoms - Anticipates transitioning to sneakers by the end of January  Weight management and physical activity - Concerned about weight gain related to medication and limited mobility during foot recovery - Currently able to go to the gym about once per week  Hematologic monitoring and infusion therapy - Has a rare genetic mutation requiring regular laboratory monitoring - Last laboratory evaluation in October; next set planned for this month - Recently received an infusion that improved previously low hemoglobin  Medication and allergy profile - Current medications include alprazolam , iron  supplements, Xarelto , and Mirena  IUD - Allergies to triamcinolone, hydrocodone , and CT contrast - Has seasonal allergies  Sleep and routine disruption - Caring for an almost three-year-old son with a late sleep schedule - Adapted her own routine and rest to accommodate her son's schedule     ROS Per HPI  Outpatient Encounter Medications as of 09/28/2024  Medication Sig   acetaminophen  (TYLENOL ) 325 MG tablet Take 650 mg by mouth every 6 (six) hours as needed for mild pain.   ALPRAZolam  (XANAX ) 0.5 MG tablet Take 1 tablet (0.5 mg total) by mouth 3 (three) times daily as needed for anxiety.   ascorbic acid  (VITAMIN C) 500 MG tablet Take 500 mg by mouth daily.   AUGTYRO  40 MG capsule Take 4 capsules by mouth twice daily.   b complex vitamins capsule Take 1 capsule by mouth daily.   Cholecalciferol (VITAMIN D-3 PO) Take 2,000 Units by mouth daily.   Ferrous Sulfate (IRON  HIGH-POTENCY) 142 (45 Fe) MG TBCR Take 142 mg by mouth daily.   ondansetron  (ZOFRAN -ODT) 8 MG disintegrating tablet Take 1 tablet (8 mg total) by mouth every 8 (eight) hours as needed for nausea or vomiting.   polyethylene glycol (MIRALAX  / GLYCOLAX ) 17 g packet Take 17 g by mouth daily as needed for moderate constipation.   XARELTO  20 MG TABS tablet TAKE 1 TABLET(20 MG) BY MOUTH DAILY WITH SUPPER   levonorgestrel  (MIRENA , 52 MG,) 20 MCG/DAY IUD 1 each by Intrauterine route once. (Patient not taking: Reported on 09/28/2024)   [DISCONTINUED] docusate sodium  (COLACE) 100 MG capsule Take 1 capsule (100 mg total) by mouth 2 (two) times daily. While taking narcotic pain medicine. (Patient not taking: Reported on 09/28/2024)   [DISCONTINUED] metoCLOPramide  (REGLAN ) 5 MG tablet Take 1 tablet (5 mg total) by mouth every 6 (six) hours as needed for nausea. (Patient not taking: Reported on 09/28/2024)   [DISCONTINUED] predniSONE  (DELTASONE ) 50 MG tablet Take 1 tablet (50 mg total) by mouth as directed. Take one tablet 13 hours , 1 tablet 7 hours and 1 tablet 1 hour prior to CT scan with IV contrast. Take Benadryl  50 mg po 1 hour before CT scan (Patient not taking: Reported on 09/28/2024)   [DISCONTINUED] senna (  SENOKOT) 8.6 MG TABS tablet Take 2 tablets (17.2 mg total) by mouth 2 (two) times daily. (Patient not taking: Reported on 09/28/2024)   No facility-administered encounter medications on file as of 09/28/2024.    Past Medical History:  Diagnosis Date   Anxiety    [employer] 11/21/2020   PAD (peripheral artery disease)    Managed by Dr Magda   PFO (patent foramen ovale)    Dr Wonda   Stage IV adenocarcinoma of lung, unspecified laterality  [facility])     Past Surgical History:  Procedure Laterality Date   ABDOMINAL AORTOGRAM W/LOWER EXTREMITY N/A 11/27/2022   Procedure: ABDOMINAL AORTOGRAM W/LOWER EXTREMITY;  Surgeon: Magda Debby SAILOR, MD;  Location: [facility] INVASIVE CV LAB;  Service: Cardiovascular;  Laterality: N/A;   IR PERC PLEURAL DRAIN W/INDWELL CATH W/IMG GUIDE  11/09/2022   ORIF TIBIA FRACTURE Right 07/08/2024   Procedure: Open reduction internal fixation right tibia fracture nonunion;  Surgeon: Kit Rush, MD;  Location: [facility];  Service: Orthopedics;  Laterality: Right;   PERIPHERAL VASCULAR ATHERECTOMY  11/27/2022   Procedure: PERIPHERAL VASCULAR ATHERECTOMY;  Surgeon: Magda Debby SAILOR, MD;  Location: [facility] INVASIVE CV LAB;  Service: Cardiovascular;;   PERIPHERAL VASCULAR THROMBECTOMY  11/27/2022   Procedure: PERIPHERAL VASCULAR THROMBECTOMY;  Surgeon: Magda Debby SAILOR, MD;  Location: [facility] INVASIVE CV LAB;  Service: Cardiovascular;;   REMOVAL OF PLEURAL DRAINAGE CATHETER N/A 12/17/2022   Procedure: MINOR REMOVAL OF PLEURAL DRAINAGE CATHETER;  Surgeon: Brenna Adine CROME, DO;  Location: [facility];  Service: Cardiopulmonary;  Laterality: N/A;   TEE WITHOUT CARDIOVERSION N/A 06/10/2023   Procedure: TRANSESOPHAGEAL ECHOCARDIOGRAM;  Surgeon: Raford Riggs, MD;  Location: [facility] INVASIVE CV LAB;  Service: Cardiovascular;  Laterality: N/A;   TIBIA OSTEOTOMY Right 07/08/2024   Procedure: Lateral closing wedge osteotomy right fibula fracture malunion with internal fixation;  Surgeon: Kit Rush, MD;  Location: [facility];  Service: Orthopedics;  Laterality: Right;   WISDOM TOOTH EXTRACTION      Family History  Problem Relation Age of Onset   Cancer Mother    Cancer Maternal Grandfather    Cancer Maternal Grandmother    Cancer Paternal Grandfather    Hypertension Paternal Grandmother     Social History   Socioeconomic History   Marital status: Married    Spouse name: Not on file   Number of children:  Not on file   Years of education: Not on file   Highest education level: Bachelor's degree (e.g., BA, AB, BS)  Occupational History   Not on file  Tobacco Use   Smoking status: Never   Smokeless tobacco: Never  Vaping Use   Vaping status: Never Used  Substance and Sexual Activity   Alcohol use: Yes    Alcohol/week: 7.0 standard drinks of alcohol    Types: 7 Glasses of wine per week    Comment: 1 glass wine in the evening    Drug use: Yes    Types: Marijuana    Comment: rare   Sexual activity: Yes    Birth control/protection: I.U.D.  Other Topics Concern   Not on file  Social History Narrative   Not on file   Social Drivers of Health   Financial Resource Strain: Low Risk  (09/26/2024)   Overall Financial Resource Strain (CARDIA)    Difficulty of Paying Living Expenses: Not hard at all  Food Insecurity: No Food Insecurity (09/26/2024)   Hunger Vital Sign    Worried About Programme Researcher, Broadcasting/film/video in  the Last Year: Never true    Ran Out of Food in the Last Year: Never true  Transportation Needs: No Transportation Needs (09/26/2024)   PRAPARE - Administrator, Civil Service (Medical): No    Lack of Transportation (Non-Medical): No  Physical Activity: Insufficiently Active (09/26/2024)   Exercise Vital Sign    Days of Exercise per Week: 1 day    Minutes of Exercise per Session: 50 min  Stress: No Stress Concern Present (09/26/2024)   [employer] of Occupational Health - Occupational Stress Questionnaire    Feeling of Stress: Only a little  Social Connections: Moderately Isolated (09/26/2024)   Social Connection and Isolation Panel    Frequency of Communication with Friends and Family: More than three times a week    Frequency of Social Gatherings with Friends and Family: Three times a week    Attends Religious Services: Never    Active Member of Clubs or Organizations: No    Attends Banker Meetings: Not on file    Marital Status: Married  Careers Information Officer Violence: Not At Risk (11/26/2022)   Humiliation, Afraid, Rape, and Kick questionnaire    Fear of Current or Ex-Partner: No    Emotionally Abused: No    Physically Abused: No    Sexually Abused: No       Objective:    BP 122/78 (BP Location: Left Arm, Patient Position: Sitting)   Pulse 80   Temp 98.4 F (36.9 C) (Temporal)   Ht 5' 6 (1.676 m)   Wt 187 lb 9.6 oz (85.1 kg) Comment: wearing boot that weighs 3-5 lbs  SpO2 100%   BMI 30.28 kg/m    Physical Exam Vitals and nursing note reviewed.  Constitutional:      General: She is not in acute distress.    Appearance: Normal appearance. She is normal weight.  HENT:     Head: Normocephalic and atraumatic.     Right Ear: External ear normal.     Left Ear: External ear normal.     Nose: Nose normal.     Mouth/Throat:     Mouth: Mucous membranes are moist.     Pharynx: Oropharynx is clear.  Eyes:     Extraocular Movements: Extraocular movements intact.     Pupils: Pupils are equal, round, and reactive to light.  Cardiovascular:     Rate and Rhythm: Normal rate and regular rhythm.     Pulses: Normal pulses.     Heart sounds: Normal heart sounds.  Pulmonary:     Effort: Pulmonary effort is normal. No respiratory distress.     Breath sounds: Normal breath sounds. No wheezing, rhonchi or rales.  Musculoskeletal:     Cervical back: Normal range of motion.     Right lower leg: No edema.     Left lower leg: No edema.     Comments: R lower leg in Cam walker, using knee scooter  Lymphadenopathy:     Cervical: No cervical adenopathy.  Neurological:     General: No focal deficit present.     Mental Status: She is alert and oriented to person, place, and time.  Psychiatric:        Mood and Affect: Mood normal.        Thought Content: Thought content normal.     No results found for any visits on 09/28/24.      Assessment & Plan:   Assessment and Plan Assessment & Plan Establish Care Flu vaccination  discussed.  Up to date on vaccines except flu shot. Flu vaccine safe with current medications. - Administered flu vaccine. - Encouraged regular exercise and monitoring of sugar intake. - Advised to contact if exposed to flu or COVID for antiviral treatment.  Postoperative care of foot fracture Postoperative status following surgery for a foot fracture. Recovery progressing well. - Continue current postoperative care plan. - Transition to wearing sneakers by end of January.  History of stroke, chronic anticoagulation, hypercoagulable state Hereditary thrombophilia managed with Xarelto  due to rare genetic mutation. - Continue Xarelto  for anticoagulation.  Iron  deficiency anemia Low hemoglobin levels. Recent infusion therapy beneficial. Awaiting lab evidence of improvement. - Continue monitoring hemoglobin levels. - Await lab results scheduled for December 22nd.  Adenocarcinoma of L lung, stage 4, immunocompromise due to drug therapy - Chronic - No current pain, SOB or other concerns today - F/u oncology as scheduled  GAD - chronic, stable - continue prn xanax   Need for flu vaccine - flu vaccine given today - no recent illness, fever      Orders Placed This Encounter  Procedures   Flu vaccine trivalent PF, 6mos and older(Flulaval,Afluria,Fluarix,Fluzone)     No orders of the defined types were placed in this encounter.   Return in about 1 year (around 09/28/2025) for cpe.  Corean LITTIE Ku, FNP

## 2024-09-28 NOTE — Patient Instructions (Addendum)
 Welcome to [facility]!  Thank you for choosing us  for your Primary Care needs.   We offer in person and video appointments for your convenience. You may call our office to schedule appointments, or you may schedule appointments with me through MyChart.   The best way to get in contact with me is via MyChart message. This will get to me faster than a phone call, unless there is an emergency, then please call 911.  The lab is located downstairs in the Sports Medicine building, we also have xray available there.   We have given your flu vaccine today.   Follow up with me in a year for your physical, sooner if needed.

## 2024-10-08 ENCOUNTER — Other Ambulatory Visit: Payer: Self-pay | Admitting: Medical Oncology

## 2024-10-11 ENCOUNTER — Telehealth: Payer: Self-pay | Admitting: Internal Medicine

## 2024-10-11 ENCOUNTER — Inpatient Hospital Stay: Attending: Internal Medicine

## 2024-10-11 ENCOUNTER — Inpatient Hospital Stay: Admitting: Internal Medicine

## 2024-10-11 VITALS — BP 133/72 | HR 82 | Temp 97.6°F | Resp 17 | Ht 66.0 in | Wt 187.9 lb

## 2024-10-11 DIAGNOSIS — C3492 Malignant neoplasm of unspecified part of left bronchus or lung: Secondary | ICD-10-CM | POA: Diagnosis present

## 2024-10-11 DIAGNOSIS — D630 Anemia in neoplastic disease: Secondary | ICD-10-CM | POA: Insufficient documentation

## 2024-10-11 DIAGNOSIS — C3490 Malignant neoplasm of unspecified part of unspecified bronchus or lung: Secondary | ICD-10-CM | POA: Diagnosis not present

## 2024-10-11 DIAGNOSIS — D509 Iron deficiency anemia, unspecified: Secondary | ICD-10-CM | POA: Insufficient documentation

## 2024-10-11 DIAGNOSIS — C7951 Secondary malignant neoplasm of bone: Secondary | ICD-10-CM | POA: Diagnosis present

## 2024-10-11 LAB — IRON AND IRON BINDING CAPACITY (CC-WL,HP ONLY)
Iron: 80 ug/dL (ref 28–170)
Saturation Ratios: 26 % (ref 10.4–31.8)
TIBC: 304 ug/dL (ref 250–450)
UIBC: 224 ug/dL

## 2024-10-11 LAB — CBC WITH DIFFERENTIAL (CANCER CENTER ONLY)
Abs Immature Granulocytes: 0.02 K/uL (ref 0.00–0.07)
Basophils Absolute: 0 K/uL (ref 0.0–0.1)
Basophils Relative: 0 %
Eosinophils Absolute: 0.1 K/uL (ref 0.0–0.5)
Eosinophils Relative: 2 %
HCT: 33.1 % — ABNORMAL LOW (ref 36.0–46.0)
Hemoglobin: 11.1 g/dL — ABNORMAL LOW (ref 12.0–15.0)
Immature Granulocytes: 0 %
Lymphocytes Relative: 34 %
Lymphs Abs: 2.2 K/uL (ref 0.7–4.0)
MCH: 30.4 pg (ref 26.0–34.0)
MCHC: 33.5 g/dL (ref 30.0–36.0)
MCV: 90.7 fL (ref 80.0–100.0)
Monocytes Absolute: 0.6 K/uL (ref 0.1–1.0)
Monocytes Relative: 10 %
Neutro Abs: 3.4 K/uL (ref 1.7–7.7)
Neutrophils Relative %: 54 %
Platelet Count: 486 K/uL — ABNORMAL HIGH (ref 150–400)
RBC: 3.65 MIL/uL — ABNORMAL LOW (ref 3.87–5.11)
RDW: 13.6 % (ref 11.5–15.5)
WBC Count: 6.3 K/uL (ref 4.0–10.5)
nRBC: 0 % (ref 0.0–0.2)

## 2024-10-11 LAB — CMP (CANCER CENTER ONLY)
ALT: 19 U/L (ref 0–44)
AST: 26 U/L (ref 15–41)
Albumin: 4.9 g/dL (ref 3.5–5.0)
Alkaline Phosphatase: 112 U/L (ref 38–126)
Anion gap: 11 (ref 5–15)
BUN: 10 mg/dL (ref 6–20)
CO2: 27 mmol/L (ref 22–32)
Calcium: 9.2 mg/dL (ref 8.9–10.3)
Chloride: 106 mmol/L (ref 98–111)
Creatinine: 0.74 mg/dL (ref 0.44–1.00)
GFR, Estimated: >60 mL/min
Glucose, Bld: 99 mg/dL (ref 70–99)
Potassium: 3.8 mmol/L (ref 3.5–5.1)
Sodium: 144 mmol/L (ref 135–145)
Total Bilirubin: 0.3 mg/dL (ref 0.0–1.2)
Total Protein: 8.1 g/dL (ref 6.5–8.1)

## 2024-10-11 LAB — FERRITIN: Ferritin: 281 ng/mL (ref 11–307)

## 2024-10-11 LAB — CK: Total CK: 142 U/L (ref 38–234)

## 2024-10-11 MED ORDER — PREDNISONE 50 MG PO TABS: ORAL_TABLET | ORAL | 0 refills | Status: AC

## 2024-10-11 NOTE — Telephone Encounter (Signed)
 Scheduled pateint for next appointment. Called and spoke with the patient, she is aware.

## 2024-10-11 NOTE — Progress Notes (Signed)
 "     [facility] [phone]   [phone]  OFFICE PROGRESS NOTE  Alvia Corean CROME, FNP [address] [place] [address]  DIAGNOSIS: Stage IVB (T4, N3, M1b) non-small cell lung cancer, adenocarcinoma presented with large left diaphragmatic surface mass in addition to left hilar, infrahilar, AP window, right and left paratracheal, subcarinal as well as left internal mammary lymphadenopathy in addition to right gastric lymph node and solitary Small right frontal calvarial osseous metastatic disease with large malignant left pleural effusion diagnosed in January 2024.    Molecular studies by [employer] as well as [employer] blood test showed:   EZR-ROS1 fusion approved by FDA Crizotinib, Entrectinib, Repotrectinib    approved in other indication Ceritinib, Lorlatinib Yes      3.6%   PD-L1 expression by [employer] that was 98%.   PRIOR THERAPY: Status post left Pleurx catheter placement for drainage of recurrent left pleural effusion.  This was removed on December 17, 2022.  CURRENT THERAPY: Repotrectinib  (Augtyro ) 160 mg p.o. daily for the first 2 weeks and then 160 mg p.o. twice daily, first dose November 27, 2022.  She is status post 22 months of treatment  INTERVAL HISTORY: Melissa Pineda 38 y.o. female returns to the clinic today for follow-up visit.Discussed the use of AI scribe software for clinical note transcription with the patient, who gave verbal consent to proceed.  History of Present Illness Melissa Pineda is a 38 year old female with stage IV non-small cell lung cancer (adenocarcinoma, EZR-ROS1 fusion positive) on repotrectinib  who presents for routine oncology follow-up and repeat blood work.  Diagnosed with stage IV non-small cell lung cancer, adenocarcinoma, in January 2024 with EZR-ROS1 fusion positivity. She has been on repotrectinib  160 mg twice daily since February 2024, now completing approximately 22 months  of therapy. She reports no chest pain, dyspnea, nausea, vomiting, diarrhea, or other adverse effects attributable to repotrectinib . She reports feeling generally well and tolerates therapy without significant complications.  She continues to experience intermittent left-sided pain, persistent but variable in intensity, localized to the area of prior significant pleural effusion and drainage at diagnosis. She reports that the pain has been more pronounced recently compared to last year, but there are no new or progressive symptoms. She has mild congestion, likely related to a recent illness in her son, but denies other infectious symptoms and does not feel acutely ill.  She is status post right lower extremity orthopedic surgery with hardware placement (17 screws, 2 plates, and a rod) performed on July 08, 2024. She continues to wear a boot and anticipates transition to regular shoes in January. She is restricted from running and treadmill use but is able to use elliptical machines and attends the gym as tolerated, planning to gradually increase activity after boot removal.  She has iron  deficiency anemia and takes oral iron  supplements. She received an iron  infusion in October 2025 after experiencing significant fatigue preceding her surgery. She reports improvement in energy following the infusion, though iron  levels remained low at her last check. Hemoglobin today is 11.1.    MEDICAL HISTORY: Past Medical History:  Diagnosis Date   Anxiety    [employer] 11/21/2020   PAD (peripheral artery disease)    Managed by Dr Magda   PFO (patent foramen ovale)    Dr Wonda   Stage IV adenocarcinoma of lung, unspecified laterality (HCC)     ALLERGIES:  is allergic to hydrocodone , tramadol , iodinated contrast media, and gadolinium derivatives.  MEDICATIONS:  Current Outpatient Medications  Medication Sig Dispense Refill   acetaminophen  (TYLENOL ) 325 MG tablet Take 650 mg by mouth every 6 (six)  hours as needed for mild pain.     ALPRAZolam  (XANAX ) 0.5 MG tablet Take 1 tablet (0.5 mg total) by mouth 3 (three) times daily as needed for anxiety. 60 tablet 0   ascorbic acid (VITAMIN C) 500 MG tablet Take 500 mg by mouth daily.     AUGTYRO  40 MG capsule Take 4 capsules by mouth twice daily. 240 capsule 5   b complex vitamins capsule Take 1 capsule by mouth daily.     Cholecalciferol (VITAMIN D-3 PO) Take 2,000 Units by mouth daily.     Ferrous Sulfate (IRON  HIGH-POTENCY) 142 (45 Fe) MG TBCR Take 142 mg by mouth daily.     levonorgestrel  (MIRENA , 52 MG,) 20 MCG/DAY IUD 1 each by Intrauterine route once. (Patient not taking: Reported on 09/28/2024)     ondansetron  (ZOFRAN -ODT) 8 MG disintegrating tablet Take 1 tablet (8 mg total) by mouth every 8 (eight) hours as needed for nausea or vomiting. 20 tablet 1   polyethylene glycol (MIRALAX  / GLYCOLAX ) 17 g packet Take 17 g by mouth daily as needed for moderate constipation.     XARELTO  20 MG TABS tablet TAKE 1 TABLET(20 MG) BY MOUTH DAILY WITH SUPPER 90 tablet 1   No current facility-administered medications for this visit.    SURGICAL HISTORY:  Past Surgical History:  Procedure Laterality Date   ABDOMINAL AORTOGRAM W/LOWER EXTREMITY N/A 11/27/2022   Procedure: ABDOMINAL AORTOGRAM W/LOWER EXTREMITY;  Surgeon: Magda Debby SAILOR, MD;  Location: [facility];  Service: Cardiovascular;  Laterality: N/A;   IR PERC PLEURAL DRAIN W/INDWELL CATH W/IMG GUIDE  11/09/2022   ORIF TIBIA FRACTURE Right 07/08/2024   Procedure: Open reduction internal fixation right tibia fracture nonunion;  Surgeon: Kit Rush, MD;  Location: [facility];  Service: Orthopedics;  Laterality: Right;   PERIPHERAL VASCULAR ATHERECTOMY  11/27/2022   Procedure: PERIPHERAL VASCULAR ATHERECTOMY;  Surgeon: Magda Debby SAILOR, MD;  Location: [facility];  Service: Cardiovascular;;   PERIPHERAL VASCULAR THROMBECTOMY  11/27/2022   Procedure: PERIPHERAL VASCULAR  THROMBECTOMY;  Surgeon: Magda Debby SAILOR, MD;  Location: [facility];  Service: Cardiovascular;;   REMOVAL OF PLEURAL DRAINAGE CATHETER N/A 12/17/2022   Procedure: MINOR REMOVAL OF PLEURAL DRAINAGE CATHETER;  Surgeon: Brenna Adine CROME, DO;  Location: [facility];  Service: Cardiopulmonary;  Laterality: N/A;   TEE WITHOUT CARDIOVERSION N/A 06/10/2023   Procedure: TRANSESOPHAGEAL ECHOCARDIOGRAM;  Surgeon: Raford Riggs, MD;  [facility];  Service: Cardiovascular;  Laterality: N/A;   TIBIA OSTEOTOMY Right 07/08/2024   Procedure: Lateral closing wedge osteotomy right fibula fracture malunion with internal fixation;  Surgeon: Kit Rush, MD;  Location: [facility];  Service: Orthopedics;  Laterality: Right;   WISDOM TOOTH EXTRACTION      REVIEW OF SYSTEMS:  Constitutional: positive for fatigue Eyes: negative Ears, nose, mouth, throat, and face: negative Respiratory: negative Cardiovascular: negative Gastrointestinal: positive for constipation Genitourinary:negative Integument/breast: negative Hematologic/lymphatic: negative Musculoskeletal:negative Neurological: negative Behavioral/Psych: negative Endocrine: negative Allergic/Immunologic: negative   PHYSICAL EXAMINATION: General appearance: alert, cooperative, and no distress Head: Normocephalic, without obvious abnormality, atraumatic Neck: no adenopathy, no JVD, supple, symmetrical, trachea midline, and thyroid  not enlarged, symmetric, no tenderness/mass/nodules Lymph nodes: Cervical, supraclavicular, and axillary nodes normal. Resp: clear to auscultation bilaterally Back: symmetric, no curvature. ROM normal. No CVA tenderness. Cardio: regular rate and rhythm,  S1, S2 normal, no murmur, click, rub or gallop GI: soft, non-tender; bowel sounds normal; no masses,  no organomegaly Extremities: extremities normal, atraumatic, no cyanosis or edema Neurologic: Alert and oriented X 3, normal strength  and tone. Normal symmetric reflexes. Normal coordination and gait  ECOG PERFORMANCE STATUS: 1 - Symptomatic but completely ambulatory  Blood pressure 133/72, pulse 82, temperature 97.6 F (36.4 C), temperature source Temporal, resp. rate 17, height 5' 6 (1.676 m), weight 187 lb 14.4 oz (85.2 kg), SpO2 99%.  LABORATORY DATA: Lab Results  Component Value Date   WBC 6.3 10/11/2024   HGB 11.1 (L) 10/11/2024   HCT 33.1 (L) 10/11/2024   MCV 90.7 10/11/2024   PLT 486 (H) 10/11/2024      Chemistry      Component Value Date/Time   NA 145 08/10/2024 1058   NA 142 05/26/2023 0938   K 4.0 08/10/2024 1058   CL 108 08/10/2024 1058   CO2 30 08/10/2024 1058   BUN 12 08/10/2024 1058   BUN 17 05/26/2023 0938   CREATININE 0.75 08/10/2024 1058      Component Value Date/Time   CALCIUM 10.1 08/10/2024 1058   ALKPHOS 96 08/10/2024 1058   AST 19 08/10/2024 1058   ALT 17 08/10/2024 1058   BILITOT 0.5 08/10/2024 1058       RADIOGRAPHIC STUDIES: No results found.    ASSESSMENT AND PLAN: This is a very pleasant 38 years old white female with  Stage IVB (T4, N3, M1b) non-small cell lung cancer, adenocarcinoma presented with large left diaphragmatic surface mass in addition to left hilar, infrahilar, AP window, right and left paratracheal, subcarinal as well as left internal mammary lymphadenopathy in addition to right gastric lymph node and solitary small right frontal calvarial osseous metastatic disease with large malignant left pleural effusion diagnosed in January 2024.   Fortunately her molecular studies showed positive ROS1 fusion.  The patient also had PD-L1 expression of 98%. The patient is currently undergoing treatment was started therapy with repotrectinib  started November 27, 2022.  Status post 22 months of treatment and has been tolerating it fairly well. Assessment and Plan Assessment & Plan Stage IV non-small cell lung cancer, adenocarcinoma with EZR-ROS1 fusion She remains  clinically well-controlled on repotrectinib  after 22 months, with ongoing disease control and no significant adverse effects. Persistent, non-progressive left-sided discomfort is attributed to prior pleural effusion and residual scarring. She expressed concern regarding surveillance imaging timing due to symptoms; plan adjusted accordingly. She is aware of second-line therapies, which are not indicated at this time. - Continued repotrectinib  160 mg BID. - Ordered repeat imaging in one month to assess disease status, with flexibility for earlier imaging if symptoms worsen or upon her request. - Provided anticipatory guidance regarding symptom monitoring and instructed her to contact the clinic for symptom escalation or desire for earlier imaging. - Scheduled follow-up in one month.  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 Recent iron  infusion improved energy and stabilized hemoglobin. Platelets remain elevated, likely reflecting ongoing iron  deficiency, but she is asymptomatic from an anemia standpoint. - Continued oral iron  supplementation. - Planned to monitor blood counts and iron  studies as clinically indicated.  Status post right lower extremity orthopedic surgery with hardware She is recovering well post right lower extremity orthopedic surgery with hardware placement. Healing is progressing, she remains on activity restrictions, and plans to transition out of her boot in January. She tolerates low-impact exercise and has  no acute complications. - Advised to continue current activity restrictions and gradually increase exercise as tolerated after boot removal. - To follow up with orthopedic surgery as scheduled. She was advised to call immediately if she has any other concerning symptoms in the interval.  The patient voices understanding of current disease status and treatment options and is in agreement with the  current care plan.  All questions were answered. The patient knows to call the clinic with any problems, questions or concerns. We can certainly see the patient much sooner if necessary.  The total time spent in the appointment was 30 minutes including review of chart and various tests results, discussions about plan of care and coordination of care plan .   Disclaimer: This note was dictated with voice recognition software. Similar sounding words can inadvertently be transcribed and may not be corrected upon review.        "

## 2024-10-20 DIAGNOSIS — S82391K Other fracture of lower end of right tibia, subsequent encounter for closed fracture with nonunion: Secondary | ICD-10-CM | POA: Diagnosis not present

## 2024-10-26 ENCOUNTER — Other Ambulatory Visit: Payer: Self-pay | Admitting: Vascular Surgery

## 2024-11-03 ENCOUNTER — Inpatient Hospital Stay: Attending: Internal Medicine

## 2024-11-03 DIAGNOSIS — C7951 Secondary malignant neoplasm of bone: Secondary | ICD-10-CM | POA: Diagnosis present

## 2024-11-03 DIAGNOSIS — C3492 Malignant neoplasm of unspecified part of left bronchus or lung: Secondary | ICD-10-CM | POA: Diagnosis present

## 2024-11-03 DIAGNOSIS — D649 Anemia, unspecified: Secondary | ICD-10-CM | POA: Insufficient documentation

## 2024-11-03 DIAGNOSIS — C3490 Malignant neoplasm of unspecified part of unspecified bronchus or lung: Secondary | ICD-10-CM

## 2024-11-03 LAB — CMP (CANCER CENTER ONLY)
ALT: 22 U/L (ref 0–44)
AST: 26 U/L (ref 15–41)
Albumin: 4.7 g/dL (ref 3.5–5.0)
Alkaline Phosphatase: 134 U/L — ABNORMAL HIGH (ref 38–126)
Anion gap: 11 (ref 5–15)
BUN: 13 mg/dL (ref 6–20)
CO2: 27 mmol/L (ref 22–32)
Calcium: 9.7 mg/dL (ref 8.9–10.3)
Chloride: 105 mmol/L (ref 98–111)
Creatinine: 0.81 mg/dL (ref 0.44–1.00)
GFR, Estimated: >60 mL/min
Glucose, Bld: 102 mg/dL — ABNORMAL HIGH (ref 70–99)
Potassium: 4.2 mmol/L (ref 3.5–5.1)
Sodium: 143 mmol/L (ref 135–145)
Total Bilirubin: <0.2 mg/dL (ref 0.0–1.2)
Total Protein: 8.2 g/dL — ABNORMAL HIGH (ref 6.5–8.1)

## 2024-11-03 LAB — CBC WITH DIFFERENTIAL (CANCER CENTER ONLY)
Abs Immature Granulocytes: 0.02 K/uL (ref 0.00–0.07)
Basophils Absolute: 0 K/uL (ref 0.0–0.1)
Basophils Relative: 0 %
Eosinophils Absolute: 0.4 K/uL (ref 0.0–0.5)
Eosinophils Relative: 5 %
HCT: 32.2 % — ABNORMAL LOW (ref 36.0–46.0)
Hemoglobin: 10.9 g/dL — ABNORMAL LOW (ref 12.0–15.0)
Immature Granulocytes: 0 %
Lymphocytes Relative: 31 %
Lymphs Abs: 2.4 K/uL (ref 0.7–4.0)
MCH: 30.9 pg (ref 26.0–34.0)
MCHC: 33.9 g/dL (ref 30.0–36.0)
MCV: 91.2 fL (ref 80.0–100.0)
Monocytes Absolute: 0.5 K/uL (ref 0.1–1.0)
Monocytes Relative: 7 %
Neutro Abs: 4.4 K/uL (ref 1.7–7.7)
Neutrophils Relative %: 57 %
Platelet Count: 533 K/uL — ABNORMAL HIGH (ref 150–400)
RBC: 3.53 MIL/uL — ABNORMAL LOW (ref 3.87–5.11)
RDW: 13.2 % (ref 11.5–15.5)
WBC Count: 7.7 K/uL (ref 4.0–10.5)
nRBC: 0 % (ref 0.0–0.2)

## 2024-11-03 LAB — IRON AND IRON BINDING CAPACITY (CC-WL,HP ONLY)
Iron: 68 ug/dL (ref 28–170)
Saturation Ratios: 24 % (ref 10.4–31.8)
TIBC: 284 ug/dL (ref 250–450)
UIBC: 216 ug/dL

## 2024-11-03 LAB — FERRITIN: Ferritin: 261 ng/mL (ref 11–307)

## 2024-11-04 ENCOUNTER — Ambulatory Visit (HOSPITAL_COMMUNITY)
Admission: RE | Admit: 2024-11-04 | Discharge: 2024-11-04 | Disposition: A | Discharge status: Home / Self Care | Source: Ambulatory Visit | Attending: Internal Medicine | Admitting: Internal Medicine

## 2024-11-04 DIAGNOSIS — C3490 Malignant neoplasm of unspecified part of unspecified bronchus or lung: Secondary | ICD-10-CM | POA: Diagnosis present

## 2024-11-04 MED ORDER — IOHEXOL 300 MG/ML  SOLN
100.0000 mL | Freq: Once | INTRAMUSCULAR | Status: AC | PRN
  Administered 2024-11-04: 100 mL via INTRAVENOUS

## 2024-11-10 ENCOUNTER — Inpatient Hospital Stay: Admitting: Internal Medicine

## 2024-11-10 VITALS — BP 128/72 | HR 90 | Temp 97.8°F | Resp 17 | Ht 66.0 in | Wt 185.9 lb

## 2024-11-10 DIAGNOSIS — C3492 Malignant neoplasm of unspecified part of left bronchus or lung: Secondary | ICD-10-CM | POA: Diagnosis not present

## 2024-11-10 NOTE — Progress Notes (Signed)
 "     [facility] [phone]   [phone]  OFFICE PROGRESS NOTE  Alvia Corean CROME, FNP [address] [place] [address]  DIAGNOSIS: Stage IVB (T4, N3, M1b) non-small cell lung cancer, adenocarcinoma presented with large left diaphragmatic surface mass in addition to left hilar, infrahilar, AP window, right and left paratracheal, subcarinal as well as left internal mammary lymphadenopathy in addition to right gastric lymph node and solitary Small right frontal calvarial osseous metastatic disease with large malignant left pleural effusion diagnosed in January 2024.    Molecular studies by [employer] as well as [employer] blood test showed:   EZR-ROS1 fusion approved by FDA Crizotinib, Entrectinib, Repotrectinib    approved in other indication Ceritinib, Lorlatinib Yes      3.6%   PD-L1 expression by [employer] that was 98%.   PRIOR THERAPY: Status post left Pleurx catheter placement for drainage of recurrent left pleural effusion.  This was removed on December 17, 2022.  CURRENT THERAPY: Repotrectinib  (Augtyro ) 160 mg p.o. daily for the first 2 weeks and then 160 mg p.o. twice daily, first dose November 27, 2022.  She is status post 23 months of treatment  INTERVAL HISTORY: Melissa Pineda 39 y.o. female returns to the clinic today for follow-up visit.Discussed the use of AI scribe software for clinical note transcription with the patient, who gave verbal consent to proceed.  History of Present Illness Melissa Pineda is a 39 year old female with stage IV non-small cell lung adenocarcinoma (EZR-ROS1 fusion, PD-L1 positive) who presents for routine evaluation and restaging after 23 months of repotrectinib  therapy.  She was diagnosed in January 2024 with stage IV non-small cell lung adenocarcinoma characterized by EZR-ROS1 fusion and PD-L1 positivity. She has completed 23 months of repotrectinib  160 mg PO daily and is currently  undergoing repeat CT imaging of the chest, abdomen, and pelvis for disease restaging. She remains asymptomatic, with no new complaints or symptoms. She expresses ongoing anxiety regarding scan results and disease monitoring, stating, I've never actually relaxed, but it does help. Imaging continues to show stable disease with persistent scarring from prior pleural fluid, and she is reassured that no new scarring is expected.  She has mild anemia, previously managed with iron  infusions as indicated. Current laboratory studies show iron , iron  saturation, and ferritin within normal limits. She denies abnormal bleeding or bruising. She inquires about a slightly elevated protein level, which is stable and attributed to adequate nutrition. Alkaline phosphatase is mildly elevated.  She has transitioned out of orthopedic boots and is now able to wear regular shoes. She is planning to resume gym activities, including biking and weight training, but will avoid treadmill use. She reports no new musculoskeletal pain or symptoms and has not yet received specific exercise recommendations pending full recovery from her fracture.  She remains active in her professional and home life, describing her work as busy and her family life as stable.    MEDICAL HISTORY: Past Medical History:  Diagnosis Date   Anxiety    [employer] 11/21/2020   PAD (peripheral artery disease)    Managed by Dr Magda   PFO (patent foramen ovale)    Dr Wonda   Stage IV adenocarcinoma of lung, unspecified laterality (HCC)     ALLERGIES:  is allergic to hydrocodone , tramadol , iodinated contrast media, and gadolinium derivatives.  MEDICATIONS:  Current Outpatient Medications  Medication Sig Dispense Refill   acetaminophen  (TYLENOL ) 325 MG tablet Take 650 mg by mouth every 6 (  six) hours as needed for mild pain.     ALPRAZolam  (XANAX ) 0.5 MG tablet Take 1 tablet (0.5 mg total) by mouth 3 (three) times daily as needed for anxiety. 60  tablet 0   ascorbic acid (VITAMIN C) 500 MG tablet Take 500 mg by mouth daily.     AUGTYRO  40 MG capsule Take 4 capsules by mouth twice daily. 240 capsule 5   b complex vitamins capsule Take 1 capsule by mouth daily.     Cholecalciferol (VITAMIN D-3 PO) Take 2,000 Units by mouth daily.     Ferrous Sulfate (IRON  HIGH-POTENCY) 142 (45 Fe) MG TBCR Take 142 mg by mouth daily.     levonorgestrel  (MIRENA , 52 MG,) 20 MCG/DAY IUD 1 each by Intrauterine route once. (Patient not taking: Reported on 09/28/2024)     ondansetron  (ZOFRAN -ODT) 8 MG disintegrating tablet Take 1 tablet (8 mg total) by mouth every 8 (eight) hours as needed for nausea or vomiting. 20 tablet 1   polyethylene glycol (MIRALAX  / GLYCOLAX ) 17 g packet Take 17 g by mouth daily as needed for moderate constipation.     predniSONE  (DELTASONE ) 50 MG tablet Prednisone  50 mg PO 13 hours, 7 hours, and 1 hour before contrast injection. Please take OTC Benadryl  50 mg p.o. x 1 with the last dose of the prednisone . 3 tablet 0   XARELTO  20 MG TABS tablet TAKE 1 TABLET(20 MG) BY MOUTH DAILY WITH SUPPER 90 tablet 1   No current facility-administered medications for this visit.    SURGICAL HISTORY:  Past Surgical History:  Procedure Laterality Date   ABDOMINAL AORTOGRAM W/LOWER EXTREMITY N/A 11/27/2022   Procedure: ABDOMINAL AORTOGRAM W/LOWER EXTREMITY;  Surgeon: Magda Debby SAILOR, MD;  Location: [facility];  Service: Cardiovascular;  Laterality: N/A;   IR PERC PLEURAL DRAIN W/INDWELL CATH W/IMG GUIDE  11/09/2022   ORIF TIBIA FRACTURE Right 07/08/2024   Procedure: Open reduction internal fixation right tibia fracture nonunion;  Surgeon: Kit Rush, MD;  Location: [facility];  Service: Orthopedics;  Laterality: Right;   PERIPHERAL VASCULAR ATHERECTOMY  11/27/2022   Procedure: PERIPHERAL VASCULAR ATHERECTOMY;  Surgeon: Magda Debby SAILOR, MD;  Location: [facility];  Service: Cardiovascular;;   PERIPHERAL VASCULAR THROMBECTOMY   11/27/2022   Procedure: PERIPHERAL VASCULAR THROMBECTOMY;  Surgeon: Magda Debby SAILOR, MD;  Location: [facility];  Service: Cardiovascular;;   REMOVAL OF PLEURAL DRAINAGE CATHETER N/A 12/17/2022   Procedure: MINOR REMOVAL OF PLEURAL DRAINAGE CATHETER;  Surgeon: Brenna Adine CROME, DO;  Location: [facility];  Service: Cardiopulmonary;  Laterality: N/A;   TEE WITHOUT CARDIOVERSION N/A 06/10/2023   Procedure: TRANSESOPHAGEAL ECHOCARDIOGRAM;  Surgeon: Raford Riggs, MD;  [facility];  Service: Cardiovascular;  Laterality: N/A;   TIBIA OSTEOTOMY Right 07/08/2024   Procedure: Lateral closing wedge osteotomy right fibula fracture malunion with internal fixation;  Surgeon: Kit Rush, MD;  Location: [facility];  Service: Orthopedics;  Laterality: Right;   WISDOM TOOTH EXTRACTION      REVIEW OF SYSTEMS:  Constitutional: positive for fatigue Eyes: negative Ears, nose, mouth, throat, and face: negative Respiratory: negative Cardiovascular: negative Gastrointestinal: negative Genitourinary:negative Integument/breast: negative Hematologic/lymphatic: negative Musculoskeletal:negative Neurological: negative Behavioral/Psych: negative Endocrine: negative Allergic/Immunologic: negative   PHYSICAL EXAMINATION: General appearance: alert, cooperative, and no distress Head: Normocephalic, without obvious abnormality, atraumatic Neck: no adenopathy, no JVD, supple, symmetrical, trachea midline, and thyroid  not enlarged, symmetric, no tenderness/mass/nodules Lymph nodes: Cervical, supraclavicular, and axillary nodes normal. Resp: clear to  auscultation bilaterally Back: symmetric, no curvature. ROM normal. No CVA tenderness. Cardio: regular rate and rhythm, S1, S2 normal, no murmur, click, rub or gallop GI: soft, non-tender; bowel sounds normal; no masses,  no organomegaly Extremities: extremities normal, atraumatic, no cyanosis or edema Neurologic: Alert and  oriented X 3, normal strength and tone. Normal symmetric reflexes. Normal coordination and gait  ECOG PERFORMANCE STATUS: 0 - Asymptomatic  Blood pressure 128/72, pulse 90, temperature 97.8 F (36.6 C), temperature source Temporal, resp. rate 17, height 5' 6 (1.676 m), weight 185 lb 14.4 oz (84.3 kg), SpO2 100%.  LABORATORY DATA: Lab Results  Component Value Date   WBC 7.7 11/03/2024   HGB 10.9 (L) 11/03/2024   HCT 32.2 (L) 11/03/2024   MCV 91.2 11/03/2024   PLT 533 (H) 11/03/2024      Chemistry      Component Value Date/Time   NA 143 11/03/2024 1610   NA 142 05/26/2023 0938   K 4.2 11/03/2024 1610   CL 105 11/03/2024 1610   CO2 27 11/03/2024 1610   BUN 13 11/03/2024 1610   BUN 17 05/26/2023 0938   CREATININE 0.81 11/03/2024 1610      Component Value Date/Time   CALCIUM 9.7 11/03/2024 1610   ALKPHOS 134 (H) 11/03/2024 1610   AST 26 11/03/2024 1610   ALT 22 11/03/2024 1610   BILITOT <0.2 11/03/2024 1610       RADIOGRAPHIC STUDIES: CT CHEST ABDOMEN PELVIS W CONTRAST Result Date: 11/04/2024 CLINICAL DATA:  Non-small cell lung cancer (NSCLC), staging. * Tracking Code: BO * EXAM: CT CHEST, ABDOMEN, AND PELVIS WITH CONTRAST TECHNIQUE: Multidetector CT imaging of the chest, abdomen and pelvis was performed following the standard protocol during bolus administration of intravenous contrast. RADIATION DOSE REDUCTION: This exam was performed according to the departmental dose-optimization program which includes automated exposure control, adjustment of the mA and/or kV according to patient size and/or use of iterative reconstruction technique. CONTRAST:  OMNIPAQUE  IOHEXOL  300 MG/ML  SOLN COMPARISON:  CT scan chest, abdomen and pelvis from 08/19/2024. FINDINGS: CT CHEST FINDINGS Cardiovascular: Normal cardiac size. No pericardial effusion. No aortic aneurysm. Mediastinum/Nodes: Visualized thyroid  gland appears grossly unremarkable. Redemonstration of slightly lobulated soft  tissue in the anterosuperior mediastinum, essentially similar to the prior study. The esophagus is nondistended precluding optimal assessment. No axillary, mediastinal or hilar lymphadenopathy by size criteria. Lungs/Pleura: The central tracheo-bronchial tree is patent. There are patchy areas of linear, plate-like atelectasis and/or scarring throughout bilateral lungs, predominantly at the left lung base. Stable irregular opacity in the left lung lower lobe, inferomedially abutting the mediastinal pleura measuring 10 x 19 mm, essentially similar to the prior study. There is associated surrounding smooth interlobular and interlobular septal thickening, also unchanged. No new mass or consolidation. No pleural effusion or pneumothorax. No suspicious lung nodules. Musculoskeletal: The visualized soft tissues of the chest wall are grossly unremarkable. No suspicious osseous lesions. CT ABDOMEN PELVIS FINDINGS Hepatobiliary: The liver is normal in size. Non-cirrhotic configuration. No suspicious mass. These is mild diffuse hepatic steatosis. No intrahepatic or extrahepatic bile duct dilation. No calcified gallstones. Normal gallbladder wall thickness. No pericholecystic inflammatory changes. Pancreas: Unremarkable. No pancreatic ductal dilatation or surrounding inflammatory changes. Spleen: Within normal limits. No focal lesion. Adrenals/Urinary Tract: Adrenal glands are unremarkable. Persistent fetal lobulations of bilateral kidneys, right more than left. No nephroureterolithiasis or hydroureteronephrosis on either side. Redemonstration of focal caliectasis asymmetrically more involving the right kidney upper pole, also unchanged. Urinary bladder is under distended, precluding optimal  assessment. However, no large mass or stones identified. No perivesical fat stranding. Stomach/Bowel: No disproportionate dilation of the small or large bowel loops. No evidence of abnormal bowel wall thickening or inflammatory changes. The  appendix is unremarkable. Vascular/Lymphatic: No ascites or pneumoperitoneum. No abdominal or pelvic lymphadenopathy, by size criteria. No aneurysmal dilation of the major abdominal arteries. Reproductive: The uterus is retroverted and contains a T-shaped intrauterine device, in satisfactory position. The ovaries are unremarkable. Other: There is a tiny fat containing umbilical hernia. The soft tissues and abdominal wall are otherwise unremarkable. Musculoskeletal: No suspicious osseous lesions. IMPRESSION: 1. Essentially stable exam. 2. Stable posttreatment changes at the left lung base. 3. No metastatic disease identified within the chest, abdomen or pelvis. 4. Multiple other nonacute observations, as described above. Electronically Signed   By: Ree Molt M.D.   On: 11/04/2024 08:27      ASSESSMENT AND PLAN: This is a very pleasant 39 years old white female with  Stage IVB (T4, N3, M1b) non-small cell lung cancer, adenocarcinoma presented with large left diaphragmatic surface mass in addition to left hilar, infrahilar, AP window, right and left paratracheal, subcarinal as well as left internal mammary lymphadenopathy in addition to right gastric lymph node and solitary small right frontal calvarial osseous metastatic disease with large malignant left pleural effusion diagnosed in January 2024.   Fortunately her molecular studies showed positive ROS1 fusion.  The patient also had PD-L1 expression of 98%. The patient is currently undergoing treatment was started therapy with repotrectinib  started November 27, 2022.  Status post 22 months of treatment and has been tolerating it fairly well. She had repeat CT scan of the chest, abdomen and pelvis performed recently.  I personally independently reviewed the scan and discussed the results with the patient today.  Her scan showed no concerning findings for disease progression. Assessment and Plan Assessment & Plan Stage IV non-small cell lung  adenocarcinoma Metastatic adenocarcinoma with EZR-ROS1 fusion and PD-L1 positivity, 23 months post-diagnosis, with excellent response to ongoing repotrectinib  therapy. Most recent CT imaging shows stable disease with only residual scarring from prior pleural effusion. Prognosis discussed in the context of favorable response and young age. Alternative therapies, including methotrexate and additional ROS1-targeted agents, are available if current therapy fails. - Continued repotrectinib  160 mg PO twice daily. - Discussed alternative therapies if current regimen fails, including Taletrectinib and investigational ROS1-targeted agents. - Provided reassurance regarding prognosis and variability in survival statistics, emphasizing her favorable response. - Scheduled follow-up in 6 weeks for blood work and 6 weeks later for repeat imaging.  Mild anemia Chronic mild anemia remains well-managed. Iron  studies and ferritin are within normal limits, with no evidence of iron  deficiency. No indication for iron  infusion. - Reviewed recent laboratory results, including iron  studies and ferritin. - No iron  infusion indicated due to normal iron  parameters. - Plan to monitor blood counts at next follow-up in 6 weeks.  History of bone fracture secondary to repotrectinib  Recovering from prior bone fracture. Mildly elevated alkaline phosphatase attributed to bone healing. She has transitioned out of orthopedic boots and is resuming regular activity without new bone-related symptoms. - Attributed elevated alkaline phosphatase to bone healing. - Advised gradual return to exercise, starting with biking and weight training, avoiding treadmill at this time. - Emphasized importance of avoiding further injury and gradual increase in activity. - Plan to monitor bone health and alkaline phosphatase. She was advised to call immediately if she has any other concerning symptoms in the interval.   The  patient voices understanding  of current disease status and treatment options and is in agreement with the current care plan.  All questions were answered. The patient knows to call the clinic with any problems, questions or concerns. We can certainly see the patient much sooner if necessary.  The total time spent in the appointment was 30 minutes including review of chart and various tests results, discussions about plan of care and coordination of care plan .   Disclaimer: This note was dictated with voice recognition software. Similar sounding words can inadvertently be transcribed and may not be corrected upon review.        "

## 2024-12-22 ENCOUNTER — Inpatient Hospital Stay: Admitting: Internal Medicine

## 2024-12-22 ENCOUNTER — Inpatient Hospital Stay: Attending: Internal Medicine
